# Patient Record
Sex: Female | Born: 1935
Health system: Southern US, Community
[De-identification: ages and names within clinical notes are randomized; demographics above are authoritative.]

## PROBLEM LIST (undated history)

## (undated) DIAGNOSIS — E039 Hypothyroidism, unspecified: Secondary | ICD-10-CM

## (undated) DIAGNOSIS — E559 Vitamin D deficiency, unspecified: Secondary | ICD-10-CM

## (undated) DIAGNOSIS — J449 Chronic obstructive pulmonary disease, unspecified: Secondary | ICD-10-CM

## (undated) DIAGNOSIS — F329 Major depressive disorder, single episode, unspecified: Secondary | ICD-10-CM

## (undated) DIAGNOSIS — E785 Hyperlipidemia, unspecified: Secondary | ICD-10-CM

## (undated) DIAGNOSIS — I10 Essential (primary) hypertension: Secondary | ICD-10-CM

## (undated) DIAGNOSIS — F32A Depression, unspecified: Secondary | ICD-10-CM

## (undated) DIAGNOSIS — R42 Dizziness and giddiness: Secondary | ICD-10-CM

## (undated) DIAGNOSIS — R7303 Prediabetes: Secondary | ICD-10-CM

## (undated) DIAGNOSIS — K219 Gastro-esophageal reflux disease without esophagitis: Secondary | ICD-10-CM

## (undated) HISTORY — DX: Gastro-esophageal reflux disease without esophagitis: K21.9

## (undated) HISTORY — DX: Prediabetes: R73.03

## (undated) HISTORY — DX: Hyperlipidemia, unspecified: E78.5

## (undated) HISTORY — DX: Vitamin D deficiency, unspecified: E55.9

## (undated) HISTORY — DX: Chronic obstructive pulmonary disease, unspecified: J44.9

## (undated) HISTORY — PX: WISDOM TOOTH EXTRACTION: SHX21

## (undated) HISTORY — DX: Essential (primary) hypertension: I10

## (undated) HISTORY — PX: TONSILLECTOMY: SUR1361

## (undated) HISTORY — PX: BREAST LUMPECTOMY: SHX2

## (undated) HISTORY — DX: Dizziness and giddiness: R42

## (undated) HISTORY — DX: Hypothyroidism, unspecified: E03.9

---

## 1968-02-05 HISTORY — PX: TUBAL LIGATION: SHX77

## 1997-04-05 ENCOUNTER — Ambulatory Visit (HOSPITAL_COMMUNITY): Admission: RE | Admit: 1997-04-05 | Discharge: 1997-04-05 | Payer: Self-pay | Admitting: Internal Medicine

## 1999-10-09 ENCOUNTER — Other Ambulatory Visit: Admission: RE | Admit: 1999-10-09 | Discharge: 1999-10-09 | Payer: Self-pay | Admitting: Obstetrics and Gynecology

## 2001-01-26 ENCOUNTER — Other Ambulatory Visit: Admission: RE | Admit: 2001-01-26 | Discharge: 2001-01-26 | Payer: Self-pay | Admitting: Obstetrics and Gynecology

## 2002-02-25 ENCOUNTER — Other Ambulatory Visit: Admission: RE | Admit: 2002-02-25 | Discharge: 2002-02-25 | Payer: Self-pay | Admitting: Obstetrics and Gynecology

## 2003-03-17 ENCOUNTER — Other Ambulatory Visit: Admission: RE | Admit: 2003-03-17 | Discharge: 2003-03-17 | Payer: Self-pay | Admitting: Obstetrics and Gynecology

## 2003-12-27 ENCOUNTER — Ambulatory Visit (HOSPITAL_COMMUNITY): Admission: RE | Admit: 2003-12-27 | Discharge: 2003-12-27 | Payer: Self-pay | Admitting: Internal Medicine

## 2003-12-30 ENCOUNTER — Ambulatory Visit (HOSPITAL_COMMUNITY): Admission: RE | Admit: 2003-12-30 | Discharge: 2003-12-30 | Payer: Self-pay | Admitting: Internal Medicine

## 2004-01-28 ENCOUNTER — Emergency Department (HOSPITAL_COMMUNITY): Admission: EM | Admit: 2004-01-28 | Discharge: 2004-01-29 | Payer: Self-pay | Admitting: Emergency Medicine

## 2004-02-07 ENCOUNTER — Ambulatory Visit: Payer: Self-pay | Admitting: Critical Care Medicine

## 2004-02-09 ENCOUNTER — Ambulatory Visit: Payer: Self-pay | Admitting: Critical Care Medicine

## 2004-02-23 ENCOUNTER — Ambulatory Visit: Payer: Self-pay | Admitting: Cardiology

## 2004-02-27 ENCOUNTER — Ambulatory Visit: Payer: Self-pay | Admitting: Critical Care Medicine

## 2004-03-06 ENCOUNTER — Ambulatory Visit: Payer: Self-pay

## 2004-03-06 ENCOUNTER — Encounter: Payer: Self-pay | Admitting: Cardiology

## 2004-04-02 ENCOUNTER — Ambulatory Visit: Payer: Self-pay | Admitting: Cardiology

## 2004-04-09 ENCOUNTER — Ambulatory Visit: Payer: Self-pay | Admitting: Critical Care Medicine

## 2004-05-28 ENCOUNTER — Other Ambulatory Visit: Admission: RE | Admit: 2004-05-28 | Discharge: 2004-05-28 | Payer: Self-pay | Admitting: Obstetrics and Gynecology

## 2005-06-06 ENCOUNTER — Ambulatory Visit: Payer: Self-pay | Admitting: Critical Care Medicine

## 2005-07-12 ENCOUNTER — Ambulatory Visit: Payer: Self-pay | Admitting: Critical Care Medicine

## 2005-08-15 ENCOUNTER — Ambulatory Visit: Payer: Self-pay | Admitting: Critical Care Medicine

## 2005-09-19 ENCOUNTER — Ambulatory Visit: Payer: Self-pay | Admitting: Internal Medicine

## 2005-10-09 ENCOUNTER — Ambulatory Visit: Payer: Self-pay | Admitting: Internal Medicine

## 2005-10-15 ENCOUNTER — Ambulatory Visit: Payer: Self-pay | Admitting: Critical Care Medicine

## 2005-11-14 ENCOUNTER — Ambulatory Visit: Payer: Self-pay | Admitting: Critical Care Medicine

## 2005-12-19 ENCOUNTER — Ambulatory Visit: Payer: Self-pay | Admitting: Critical Care Medicine

## 2006-01-20 ENCOUNTER — Ambulatory Visit: Payer: Self-pay | Admitting: Critical Care Medicine

## 2006-02-26 ENCOUNTER — Ambulatory Visit: Payer: Self-pay | Admitting: Critical Care Medicine

## 2006-03-28 ENCOUNTER — Ambulatory Visit: Payer: Self-pay | Admitting: Critical Care Medicine

## 2006-04-29 ENCOUNTER — Ambulatory Visit: Payer: Self-pay | Admitting: Critical Care Medicine

## 2006-05-29 ENCOUNTER — Ambulatory Visit: Payer: Self-pay | Admitting: Internal Medicine

## 2006-07-03 ENCOUNTER — Ambulatory Visit: Payer: Self-pay | Admitting: Internal Medicine

## 2006-07-31 ENCOUNTER — Ambulatory Visit: Payer: Self-pay | Admitting: Internal Medicine

## 2006-09-04 ENCOUNTER — Ambulatory Visit: Payer: Self-pay | Admitting: Internal Medicine

## 2006-10-02 ENCOUNTER — Ambulatory Visit: Payer: Self-pay | Admitting: Internal Medicine

## 2006-11-03 ENCOUNTER — Ambulatory Visit: Payer: Self-pay | Admitting: Internal Medicine

## 2006-11-12 DIAGNOSIS — J449 Chronic obstructive pulmonary disease, unspecified: Secondary | ICD-10-CM

## 2006-11-12 DIAGNOSIS — K219 Gastro-esophageal reflux disease without esophagitis: Secondary | ICD-10-CM

## 2006-12-08 ENCOUNTER — Ambulatory Visit: Payer: Self-pay | Admitting: Internal Medicine

## 2007-01-07 ENCOUNTER — Ambulatory Visit: Payer: Self-pay | Admitting: Internal Medicine

## 2007-02-10 ENCOUNTER — Ambulatory Visit: Payer: Self-pay | Admitting: Internal Medicine

## 2007-03-13 ENCOUNTER — Ambulatory Visit: Payer: Self-pay | Admitting: Internal Medicine

## 2007-04-08 ENCOUNTER — Ambulatory Visit: Payer: Self-pay | Admitting: Critical Care Medicine

## 2007-05-07 ENCOUNTER — Ambulatory Visit: Payer: Self-pay | Admitting: Critical Care Medicine

## 2007-05-07 DIAGNOSIS — J45909 Unspecified asthma, uncomplicated: Secondary | ICD-10-CM | POA: Insufficient documentation

## 2007-06-05 ENCOUNTER — Encounter: Payer: Self-pay | Admitting: Critical Care Medicine

## 2007-06-10 ENCOUNTER — Ambulatory Visit: Payer: Self-pay | Admitting: Critical Care Medicine

## 2007-07-10 ENCOUNTER — Ambulatory Visit: Payer: Self-pay | Admitting: Critical Care Medicine

## 2007-08-10 ENCOUNTER — Ambulatory Visit: Payer: Self-pay | Admitting: Critical Care Medicine

## 2007-09-10 ENCOUNTER — Ambulatory Visit: Payer: Self-pay | Admitting: Critical Care Medicine

## 2007-10-13 ENCOUNTER — Ambulatory Visit: Payer: Self-pay | Admitting: Critical Care Medicine

## 2007-11-13 ENCOUNTER — Ambulatory Visit: Payer: Self-pay | Admitting: Critical Care Medicine

## 2007-12-15 ENCOUNTER — Ambulatory Visit: Payer: Self-pay | Admitting: Critical Care Medicine

## 2008-01-12 ENCOUNTER — Ambulatory Visit: Payer: Self-pay | Admitting: Critical Care Medicine

## 2008-02-09 ENCOUNTER — Encounter: Payer: Self-pay | Admitting: Critical Care Medicine

## 2008-02-11 ENCOUNTER — Ambulatory Visit: Payer: Self-pay | Admitting: Pulmonary Disease

## 2008-03-04 ENCOUNTER — Ambulatory Visit: Payer: Self-pay | Admitting: Internal Medicine

## 2008-03-04 LAB — CONVERTED CEMR LAB
Basophils Absolute: 0 10*3/uL (ref 0.0–0.1)
Basophils Relative: 0.6 % (ref 0.0–3.0)
Eosinophils Absolute: 0.2 10*3/uL (ref 0.0–0.7)
Eosinophils Relative: 2.3 % (ref 0.0–5.0)
HCT: 39.8 % (ref 36.0–46.0)
Hemoglobin: 13.4 g/dL (ref 12.0–15.0)
Lymphocytes Relative: 22.1 % (ref 12.0–46.0)
MCHC: 33.6 g/dL (ref 30.0–36.0)
MCV: 91.8 fL (ref 78.0–100.0)
Monocytes Absolute: 0.6 10*3/uL (ref 0.1–1.0)
Monocytes Relative: 8.2 % (ref 3.0–12.0)
Neutro Abs: 5 10*3/uL (ref 1.4–7.7)
Neutrophils Relative %: 66.8 % (ref 43.0–77.0)
Platelets: 219 10*3/uL (ref 150–400)
RBC: 4.34 M/uL (ref 3.87–5.11)
RDW: 13.4 % (ref 11.5–14.6)
WBC: 7.4 10*3/uL (ref 4.5–10.5)

## 2008-03-08 ENCOUNTER — Encounter: Payer: Self-pay | Admitting: Internal Medicine

## 2008-03-08 LAB — CONVERTED CEMR LAB
IgE (Immunoglobulin E), Serum: 151 intl units/mL (ref 0.0–180.0)
IgE (Immunoglobulin E), Serum: 156.7 intl units/mL (ref 0.0–180.0)

## 2008-03-14 ENCOUNTER — Ambulatory Visit: Payer: Self-pay | Admitting: Pulmonary Disease

## 2008-04-05 ENCOUNTER — Ambulatory Visit: Payer: Self-pay | Admitting: Internal Medicine

## 2008-04-14 ENCOUNTER — Ambulatory Visit: Payer: Self-pay | Admitting: Internal Medicine

## 2008-05-16 ENCOUNTER — Ambulatory Visit: Payer: Self-pay | Admitting: Critical Care Medicine

## 2008-05-24 ENCOUNTER — Ambulatory Visit: Payer: Self-pay | Admitting: Critical Care Medicine

## 2008-06-15 ENCOUNTER — Ambulatory Visit: Payer: Self-pay | Admitting: Critical Care Medicine

## 2008-07-05 ENCOUNTER — Ambulatory Visit: Payer: Self-pay | Admitting: Critical Care Medicine

## 2008-07-08 ENCOUNTER — Encounter: Payer: Self-pay | Admitting: Critical Care Medicine

## 2008-07-18 ENCOUNTER — Ambulatory Visit: Payer: Self-pay | Admitting: Internal Medicine

## 2008-07-19 ENCOUNTER — Ambulatory Visit: Payer: Self-pay | Admitting: Internal Medicine

## 2008-07-19 ENCOUNTER — Encounter: Payer: Self-pay | Admitting: Critical Care Medicine

## 2008-08-04 ENCOUNTER — Encounter: Payer: Self-pay | Admitting: Critical Care Medicine

## 2008-08-17 ENCOUNTER — Ambulatory Visit: Payer: Self-pay | Admitting: Critical Care Medicine

## 2008-08-23 ENCOUNTER — Ambulatory Visit: Payer: Self-pay | Admitting: Critical Care Medicine

## 2008-09-16 ENCOUNTER — Ambulatory Visit: Payer: Self-pay | Admitting: Critical Care Medicine

## 2008-10-17 ENCOUNTER — Ambulatory Visit: Payer: Self-pay | Admitting: Critical Care Medicine

## 2008-10-17 ENCOUNTER — Encounter: Payer: Self-pay | Admitting: Internal Medicine

## 2008-10-17 ENCOUNTER — Telehealth (INDEPENDENT_AMBULATORY_CARE_PROVIDER_SITE_OTHER): Payer: Self-pay | Admitting: *Deleted

## 2008-11-16 ENCOUNTER — Ambulatory Visit: Payer: Self-pay | Admitting: Critical Care Medicine

## 2008-12-19 ENCOUNTER — Ambulatory Visit: Payer: Self-pay | Admitting: Internal Medicine

## 2009-01-18 ENCOUNTER — Ambulatory Visit: Payer: Self-pay | Admitting: Critical Care Medicine

## 2009-02-07 ENCOUNTER — Encounter: Payer: Self-pay | Admitting: Critical Care Medicine

## 2009-02-17 ENCOUNTER — Ambulatory Visit: Payer: Self-pay | Admitting: Critical Care Medicine

## 2009-03-20 ENCOUNTER — Ambulatory Visit: Payer: Self-pay | Admitting: Internal Medicine

## 2009-04-18 ENCOUNTER — Ambulatory Visit: Payer: Self-pay | Admitting: Critical Care Medicine

## 2009-05-08 ENCOUNTER — Ambulatory Visit: Payer: Self-pay | Admitting: Critical Care Medicine

## 2009-05-22 ENCOUNTER — Ambulatory Visit: Payer: Self-pay | Admitting: Critical Care Medicine

## 2009-06-14 ENCOUNTER — Encounter: Payer: Self-pay | Admitting: Critical Care Medicine

## 2009-06-21 ENCOUNTER — Ambulatory Visit: Payer: Self-pay | Admitting: Critical Care Medicine

## 2009-07-20 ENCOUNTER — Ambulatory Visit: Payer: Self-pay | Admitting: Critical Care Medicine

## 2009-08-17 ENCOUNTER — Ambulatory Visit: Payer: Self-pay | Admitting: Critical Care Medicine

## 2009-09-15 ENCOUNTER — Ambulatory Visit: Payer: Self-pay | Admitting: Critical Care Medicine

## 2009-10-16 ENCOUNTER — Ambulatory Visit: Payer: Self-pay | Admitting: Critical Care Medicine

## 2009-11-14 ENCOUNTER — Ambulatory Visit: Payer: Self-pay | Admitting: Critical Care Medicine

## 2009-11-22 ENCOUNTER — Ambulatory Visit (HOSPITAL_COMMUNITY): Admission: RE | Admit: 2009-11-22 | Discharge: 2009-11-22 | Payer: Self-pay | Admitting: Internal Medicine

## 2009-11-22 ENCOUNTER — Encounter: Admission: RE | Admit: 2009-11-22 | Discharge: 2009-11-22 | Payer: Self-pay | Admitting: Obstetrics and Gynecology

## 2009-12-14 ENCOUNTER — Ambulatory Visit: Payer: Self-pay | Admitting: Critical Care Medicine

## 2010-01-12 ENCOUNTER — Ambulatory Visit: Payer: Self-pay | Admitting: Critical Care Medicine

## 2010-02-13 ENCOUNTER — Ambulatory Visit
Admission: RE | Admit: 2010-02-13 | Discharge: 2010-02-13 | Payer: Self-pay | Source: Home / Self Care | Attending: Critical Care Medicine | Admitting: Critical Care Medicine

## 2010-02-13 ENCOUNTER — Telehealth: Payer: Self-pay | Admitting: Critical Care Medicine

## 2010-02-14 ENCOUNTER — Encounter: Payer: Self-pay | Admitting: Critical Care Medicine

## 2010-02-15 ENCOUNTER — Encounter: Payer: Self-pay | Admitting: Critical Care Medicine

## 2010-02-16 ENCOUNTER — Ambulatory Visit
Admission: RE | Admit: 2010-02-16 | Discharge: 2010-02-16 | Payer: Self-pay | Source: Home / Self Care | Attending: Critical Care Medicine | Admitting: Critical Care Medicine

## 2010-02-19 ENCOUNTER — Encounter: Payer: Self-pay | Admitting: Critical Care Medicine

## 2010-03-08 NOTE — Assessment & Plan Note (Signed)
Summary: xolair/apc   Nurse Visit   Allergies: 1)  ! Codeine 2)  ! Darvocet  Medication Administration  Injection # 1:    Medication: Xolair (omalizumab) 150mg     Diagnosis: EXTRINSIC ASTHMA, UNSPECIFIED (ICD-493.00)    Route: IM    Site: L deltoid    Exp Date: 11/04/2012    Lot #: [id number]    Mfr: Genetech    Comments: xolair  150, units 30 1.2 ml x 1 in l eft deltoid. Pt waited 20 mins.    Given by: Drucie Opitz, CMA  Orders Added: 1)  Xolair (omalizumab) 150mg  [J2357] 2)  Administration xolair injection [phone]

## 2010-03-08 NOTE — Assessment & Plan Note (Signed)
Summary: 1 month xolair///kp   Nurse Visit   Allergies: 1)  ! Codeine 2)  ! Darvocet  Medication Administration  Injection # 1:    Medication: Xolair (omalizumab) 150mg     Diagnosis: EXTRINSIC ASTHMA, UNSPECIFIED (ICD-493.00)    Route: SQ    Site: L deltoid    Exp Date: 06/04/2012    Lot #: [id number]    Mfr: GENENTECH    Comments: 1.2 ML IN LEFT ARM PT WAITED 30 MINS CHARGES 96401 AND  J2357    Patient tolerated injection without complications    Given by: TAMMY SCOTT IN ALLERGY LAB   Medication Administration  Injection # 1:    Medication: Xolair (omalizumab) 150mg     Diagnosis: EXTRINSIC ASTHMA, UNSPECIFIED (ICD-493.00)    Route: SQ    Site: L deltoid    Exp Date: 06/04/2012    Lot #: [id number]    Mfr: GENENTECH    Comments: 1.2 ML IN LEFT ARM PT WAITED 30 MINS CHARGES 96401 AND  [id number]    Patient tolerated injection without complications    Given by: TAMMY SCOTT IN ALLERGY LAB

## 2010-03-08 NOTE — Assessment & Plan Note (Signed)
Summary: XOLAIR///KP   Nurse Visit   Allergies: 1)  ! Codeine 2)  ! Darvocet  Medication Administration  Injection # 1:    Medication: Xolair (omalizumab) 150mg     Diagnosis: EXTRINSIC ASTHMA, UNSPECIFIED (ICD-493.00)    Route: SQ    Site: L deltoid    Exp Date: 06/2012    Lot #: [id number]    Mfr: Mendel Ryder    Comments: Injection given by Dimas Millin in allergy lab. Xolair 150mg . 1.32ml x 1 in Left Deltoid. Pt waited 30 minutes.     Patient tolerated injection without complications  Orders Added: 1)  Xolair (omalizumab) 150mg  [J2357] 2)  Administration xolair injection [phone]

## 2010-03-08 NOTE — Assessment & Plan Note (Signed)
Summary: Geoffry Paradise ///kp   Nurse Visit   Allergies: 1)  ! Codeine 2)  ! Darvocet  Medication Administration  Injection # 1:    Medication: Xolair (omalizumab) 150mg     Diagnosis: EXTRINSIC ASTHMA, UNSPECIFIED (ICD-493.00)    Route: SQ    Site: R deltoid    Exp Date: 11/2012    Lot #: [id number]    Mfr: Genetech    Comments: 1.2 ML IN RIGHT ARM 150MG  PT WAITED 15 MINS CHARGED J2357 AND 04540    Patient tolerated injection without complications    Given by: SUSANNE FORD IN ALLERGY LAB  Orders Added: 1)  Xolair (omalizumab) 150mg  [J2357] 2)  Administration xolair injection [[id number]]   Medication Administration  Injection # 1:    Medication: Xolair (omalizumab) 150mg     Diagnosis: EXTRINSIC ASTHMA, UNSPECIFIED (ICD-493.00)    Route: SQ    Site: R deltoid    Exp Date: 11/2012    Lot #: [id number]    Mfr: Genetech    Comments: 1.2 ML IN RIGHT ARM 150MG  PT WAITED 15 MINS CHARGED J2357 AND 56213    Patient tolerated injection without complications    Given by: SUSANNE FORD IN ALLERGY LAB  Orders Added: 1)  Xolair (omalizumab) 150mg  [J2357] 2)  Administration xolair injection [[id number]]

## 2010-03-08 NOTE — Medication Information (Signed)
Summary: [facility]  CMN/[facility]   Imported By: Lester  02/20/2010 07:08:48  _____________________________________________________________________  External Attachment:    Type:   Image     Comment:   External Document

## 2010-03-08 NOTE — Assessment & Plan Note (Signed)
Summary: xolair/apc   Nurse Visit   Allergies: 1)  ! Codeine 2)  ! Darvocet  Medication Administration  Injection # 1:    Medication: Xolair (omalizumab) 150mg     Diagnosis: EXTRINSIC ASTHMA, UNSPECIFIED (ICD-493.00)    Route: IM    Site: R deltoid    Exp Date: 09/04/2012    Lot #: [id number]    Mfr: Salome Spotted    Comments: xolair 150 mg, 30 units    Given by: Clarise Cruz (AAMA)  Orders Added: 1)  Xolair (omalizumab) 150mg  [J2357] 2)  Administration xolair injection [[id number]]   Medication Administration  Injection # 1:    Medication: Xolair (omalizumab) 150mg     Diagnosis: EXTRINSIC ASTHMA, UNSPECIFIED (ICD-493.00)    Route: IM    Site: R deltoid    Exp Date: 09/04/2012    Lot #: [id number]    Mfr: Salome Spotted    Comments: xolair 150 mg, 30 units    Given by: Clarise Cruz (AAMA)  Orders Added: 1)  Xolair (omalizumab) 150mg  [J2357] 2)  Administration xolair injection [[id number]]

## 2010-03-08 NOTE — Assessment & Plan Note (Signed)
Summary: Kathy Whitaker ///kp   Nurse Visit   Allergies: 1)  ! Codeine 2)  ! Darvocet  Medication Administration  Injection # 1:    Medication: Xolair (omalizumab) 150mg     Diagnosis: EXTRINSIC ASTHMA, UNSPECIFIED (ICD-493.00)    Route: SQ    Site: L deltoid    Exp Date: 02/2013    Lot #: [id number]    Mfr: Genetech    Comments: 1.2 ML IN LEFT ARM 150MG  CHARGED [id number] AND  04540    Given by: Dimas Millin IN ALLERGY LAB  Orders Added: 1)  Xolair (omalizumab) 150mg  [J2357] 2)  Administration xolair injection [[id number]]   Medication Administration  Injection # 1:    Medication: Xolair (omalizumab) 150mg     Diagnosis: EXTRINSIC ASTHMA, UNSPECIFIED (ICD-493.00)    Route: SQ    Site: L deltoid    Exp Date: 02/2013    Lot #: [id number]    Mfr: Genetech    Comments: 1.2 ML IN LEFT ARM 150MG  CHARGED [id number] AND  56213    Given by: Dimas Millin IN ALLERGY LAB  Orders Added: 1)  Xolair (omalizumab) 150mg  [J2357] 2)  Administration xolair injection [08657]

## 2010-03-08 NOTE — Assessment & Plan Note (Signed)
Summary: Pulmonary OV   Primary Provider/Referring Provider:  Oneta Rack  CC:  6 month asthma follow up.  Pt states breathing is doing "good" as long as she is using nebs.  States she has a dry cough x 2-3 days.  Denies wheezing and chest tightness.  History of Present Illness:  77 yo WF with severe atopic asthma  May 08, 2009  Pt doing well and no new issues. Pt denies any significant sore throat, nasal congestion or excess secretions, fever, chills, sweats, unintended weight loss, pleurtic or exertional chest pain, orthopnea PND, or leg swelling Pt denies any increase in rescue therapy over baseline, denies waking up needing it or having any early am or nocturnal exacerbations of coughing/wheezing/or dyspnea.    Preventive Screening-Counseling & Management  Alcohol-Tobacco     Smoking Status: never  Current Medications (verified): 1)  Levothroid 75 Mcg Tabs (Levothyroxine Sodium) .[name].. 1 By Mouth Daily 2)  Zocor 40 Mg  Tabs (Simvastatin) .... One By Mouth Once Daily 3)  Boniva 150 Mg Tabs (Ibandronate Sodium) .... Take 1 By Mouth Once A Month 4)  Calcium 500/d 500-200 Mg-Unit  Tabs (Calcium Carbonate-Vitamin D) .... One By Mouth Once Daily 5)  Zoloft 50 Mg  Tabs (Sertraline Hcl) .... One By Mouth Once Daily 6)  Xolair 150 Mg  Solr (Omalizumab) .... 150mg  Subcutaneously Monthly 7)  Combivent 103-18 Mcg/act  Aero (Albuterol-Ipratropium) .... Take As Directed 8)  Hhn .... As Needed 9)  Epipen 0.3 Mg/0.55ml (1:1000) Devi (Epinephrine Hcl (Anaphylaxis)) .... For Severe Allergic Reaction 10)  Duoneb 0.5-2.5 (3) Mg/64ml Soln (Ipratropium-Albuterol) .... Use 1 Vial in Nebulizer  4 Times Daily As Needed 11)  Sm Loratadine 10 Mg Tabs (Loratadine) .[name].. 1 By Mouth Daily 12)  Pulmicort 0.5 Mg/66ml  Susp (Budesonide (Inhalation)) .... One Respule Into Nebulizer Twice Daily. 13)  Brovana 15 Mcg/51ml  Nebu (Arformoterol Tartrate) .... One in Nebulizer Twice Daily 14)  Vitamin D 1000 Unit Caps  (Cholecalciferol) .... Two By Mouth Daily  Allergies (verified): 1)  ! Codeine 2)  ! Darvocet  Past History:  Past medical, surgical, family and social histories (including risk factors) reviewed, and no changes noted (except as noted below).  Past Medical History: Reviewed history from 04/05/2008 and no changes required. Asthma  Skin test pos 10/09/05, Immunocap/ RAST 03/04/08 GERD Hyperlipidemia Hypothyroidism  Past Pulmonary History:  Pulmonary History: Asthma  Skin test pos 10/09/05, Immunocap/ RAST 03/04/08    -FeV1 90% FeV1/FVC 72%  TLC 118% DLCO 89%  Family History: Reviewed history and no changes required.  Social History: Reviewed history and no changes required.  Review of Systems       The patient complains of shortness of breath with activity.  The patient denies shortness of breath at rest, productive cough, non-productive cough, coughing up blood, chest pain, irregular heartbeats, acid heartburn, indigestion, loss of appetite, weight change, abdominal pain, difficulty swallowing, sore throat, tooth/dental problems, headaches, nasal congestion/difficulty breathing through nose, sneezing, itching, ear ache, anxiety, depression, hand/feet swelling, joint stiffness or pain, rash, change in color of mucus, and fever.    Vital Signs:  Patient profile:   77 year old female Height:      60 inches Weight:      151.25 pounds BMI:     29.65 O2 Sat:      96 % on Room air Temp:     97.6 degrees F oral Pulse rate:   71 / minute BP sitting:   114 / 62  (  left arm) Cuff size:   regular  Vitals Entered By: Gweneth Dimitri RN (May 08, 2009 4:13 PM)  O2 Flow:  Room air CC: 6 month asthma follow up.  Pt states breathing is doing "good" as long as she is using nebs.  States she has a dry cough x 2-3 days.  Denies wheezing and chest tightness Comments Medications reviewed with patient Daytime contact number verified with patient. Gweneth Dimitri RN  May 08, 2009 4:15  PM    Physical Exam  Additional Exam:  Gen: Pleasant, well-nourished, in no distress , normal affect ENT: no lesions, no post nasal drip Neck: No JVD, no TMG, no carotid bruits Lungs: No use of accessory muscles, no dullness to percussion, clear Cardiovascular: RRR, heart sounds normal, no murmurs or gallops, no peripheral edema Abdomen: soft and non-tender, no HSM, BS normal Musculoskeletal: No deformities, no cyanosis or clubbing Neuro: alert, non-focal Skin: no areas of urticaria seen at L arm at [facility] site of injections     Impression & Recommendations:  Problem # 1:  EXTRINSIC ASTHMA, UNSPECIFIED (ICD-493.00) Assessment Unchanged  Moderate persistent asthma stable at this time plan No change in inhaled medications.   Maintain treatment program as currently prescribed. cont xolair as is  Complete Medication List: 1)  Levothroid 75 Mcg Tabs (Levothyroxine sodium) .[name].. 1 by mouth daily 2)  Zocor 40 Mg Tabs (Simvastatin) .... One by mouth once daily 3)  Boniva 150 Mg Tabs (Ibandronate sodium) .... Take 1 by mouth once a month 4)  Calcium 500/d 500-200 Mg-unit Tabs (Calcium carbonate-vitamin d) .... One by mouth once daily 5)  Zoloft 50 Mg Tabs (Sertraline hcl) .... One by mouth once daily 6)  Xolair 150 Mg Solr (Omalizumab) .... 150mg  subcutaneously monthly 7)  Combivent 103-18 Mcg/act Aero (Albuterol-ipratropium) .... Take as directed 8)  Hhn  .... As needed 9)  Epipen 0.3 Mg/0.57ml (1:1000) Devi (Epinephrine hcl (anaphylaxis)) .... For severe allergic reaction 10)  Duoneb 0.5-2.5 (3) Mg/67ml Soln (Ipratropium-albuterol) .... Use 1 vial in nebulizer  4 times daily as needed 11)  Sm Loratadine 10 Mg Tabs (Loratadine) .[name].. 1 by mouth daily 12)  Pulmicort 0.5 Mg/22ml Susp (Budesonide (inhalation)) .... One respule into nebulizer twice daily. 13)  Brovana 15 Mcg/59ml Nebu (Arformoterol tartrate) .... One in nebulizer twice daily 14)  Vitamin D 1000 Unit Caps (Cholecalciferol)  .... Two by mouth daily  Other Orders: Est. Patient Level III (16109)  Patient Instructions: 1)  No change in medications  2)  Return 5 months    Immunization History:  Influenza Immunization History:    Influenza:  historical (12/05/2008)

## 2010-03-08 NOTE — Letter (Signed)
Summary: Statement of Medical Necessity / [facility]  Statement of Medical Necessity / [facility]   Imported By: Lennie Odor 02/26/2010 12:18:02  _____________________________________________________________________  External Attachment:    Type:   Image     Comment:   External Document

## 2010-03-08 NOTE — Progress Notes (Signed)
Summary: neb refills at [facility]  Phone Note Call from Patient   Caller: Patient Call For: wright Summary of Call: patient is here she states that [facility] told her they have sent several messages over regarding her medication for her nebulizer. Patient has enough left to last for about 1 week. She can be reached [phone] Initial call taken by: Vedia Coffer,  February 13, 2010 2:09 PM  Follow-up for Phone Call        Pt needs appt, is overdue- [facility] x 1 Vernie Murders  February 13, 2010 3:15 PM  Returning call.Darletta Moll  February 14, 2010 2:29 PM  called spoke with patient.  appt scheduled w/ [facility] for 1.13.12 @ 1020.  pt requests this refill be taken care of today as her brovana has to be refrigerated.  **NOTE: no neb meds are on pt's med list.  crystal, have you seen a refill request from [facility] for this pt's brovana and budesonide?  no refills for her in triage. Boone Master CNA/MA  February 14, 2010 2:41 PM   called spoke with crystal.  no sign of this fax in her or [facility]'s paperwork.   called spoke with Victorino Dike at [facility].  she states that this refill was sent via [facility] on 12.29 and 1.9.  asked Victorino Dike to go ahead and fax this to the triage fax number.  will forward to Crystal. Boone Master CNA/MA  February 14, 2010 2:52 PM     Additional Follow-up for Phone Call Additional follow up Details #1::        Dr. Delford Field, pt requesting refill rx be signed today.  Rxs placed in your to do folder. Gweneth Dimitri RN  February 14, 2010 3:49 PM     Additional Follow-up for Phone Call Additional follow up Details #2::    noted and will sign Follow-up by: Storm Frisk MD,  February 14, 2010 5:05 PM  New/Updated Medications: BROVANA 15 MCG/2ML NEBU (ARFORMOTEROL TARTRATE) 1 vial in HHN two times a day BUDESONIDE 0.25 MG/2ML SUSP (BUDESONIDE) 1 vial in HHN two times a day

## 2010-03-08 NOTE — Assessment & Plan Note (Signed)
Summary: xolair/ mbw   Nurse Visit   Allergies: 1)  ! Codeine 2)  ! Darvocet  Medication Administration  Injection # 1:    Medication: Xolair (omalizumab) 150mg     Diagnosis: EXTRINSIC ASTHMA, UNSPECIFIED (ICD-493.00)    Route: SQ    Site: R deltoid    Exp Date: 05/05/2012    Lot #: [id number]    Mfr: genentech    Comments: 1.39ml in right arm pt waited 30 min    Patient tolerated injection without complications    Given by: tammy scott in allergy  Orders Added: 1)  Xolair (omalizumab) 150mg  [J2357] 2)  Admin of patients own med IM/SQ [04540J]

## 2010-03-08 NOTE — Letter (Signed)
Summary: SMN for Nebulizer & meds/[facility]  [facility] for Nebulizer & meds/[facility]   Imported By: Sherian Rein 02/10/2009 10:11:04  _____________________________________________________________________  External Attachment:    Type:   Image     Comment:   External Document

## 2010-03-08 NOTE — Assessment & Plan Note (Signed)
Summary: xolair/apc   Nurse Visit   Allergies: 1)  ! Codeine 2)  ! Darvocet  Medication Administration  Injection # 1:    Medication: Xolair (omalizumab) 150mg     Diagnosis: 493.00    Route: SQ    Site: R deltoid    Exp Date: 06/2012    Lot #: [id number]    Mfr: Salome Spotted    Comments: 1.2 ML IN RIGHT ARM CHARGED [phone] AND [phone]    Patient tolerated injection without complications    Given by: TAMMY SCOTT IN ALLERGY LAB   Medication Administration  Injection # 1:    Medication: Xolair (omalizumab) 150mg     Diagnosis: 493.00    Route: SQ    Site: R deltoid    Exp Date: 06/2012    Lot #: [id number]    Mfr: Salome Spotted    Comments: 1.2 ML IN RIGHT ARM CHARGED [id number] AND [phone]    Patient tolerated injection without complications    Given by: TAMMY SCOTT IN ALLERGY LAB

## 2010-03-08 NOTE — Assessment & Plan Note (Signed)
Summary: xolair/apc   Nurse Visit   Allergies: 1)  ! Codeine 2)  ! Darvocet  Medication Administration  Injection # 1:    Medication: Xolair (omalizumab) 150mg     Diagnosis: EXTRINSIC ASTHMA, UNSPECIFIED (ICD-493.00)    Route: SQ    Site: R deltoid    Exp Date: 02/2012    Lot #: [id number]    Mfr: Mendel Ryder    Comments: Injection given by Dimas Millin in allergy lab. Xolair 150mg . 1.57ml x 1 in Right Deltoid. Pt waited 15 minutes.    Patient tolerated injection without complications  Orders Added: 1)  Admin of Therapeutic Inj  intramuscular or subcutaneous [96372] 2)  Xolair (omalizumab) 150mg  [J2357]

## 2010-03-08 NOTE — Letter (Signed)
Summary: Statement of Medical Necessity / [facility]  Statement of Medical Necessity / [facility]   Imported By: Lennie Odor 02/26/2010 11:09:24  _____________________________________________________________________  External Attachment:    Type:   Image     Comment:   External Document

## 2010-03-08 NOTE — Assessment & Plan Note (Signed)
Summary: xolair 1 month///kp   Nurse Visit   Allergies: 1)  ! Codeine 2)  ! Darvocet  Medication Administration  Injection # 1:    Medication: Xolair (omalizumab) 150mg     Diagnosis: EXTRINSIC ASTHMA, UNSPECIFIED (ICD-493.00)    Route: SQ    Site: L deltoid    Exp Date: 04/2012    Lot #: [id number]    Mfr: Mendel Ryder    Comments: Injection given by Dimas Millin in allergy lab. Xolair 150mg . 1.25ml x 1in Left Deltoid. Pt waited 15 minutes.     Patient tolerated injection without complications  Orders Added: 1)  Admin of Therapeutic Inj  intramuscular or subcutaneous [96372] 2)  Xolair (omalizumab) 150mg  [J2357]

## 2010-03-08 NOTE — Assessment & Plan Note (Signed)
Summary: 1 MONTH Geoffry Paradise ///KP   Nurse Visit   Allergies: 1)  ! Codeine 2)  ! Darvocet  Medication Administration  Injection # 1:    Medication: Xolair (omalizumab) 150mg     Diagnosis: EXTRINSIC ASTHMA, UNSPECIFIED (ICD-493.00)    Route: SQ    Site: R deltoid    Exp Date: 04/06/2012    Lot #: [id number]    Mfr: genetech    Comments: 1.2 ML IN RIGHT PT WAITED 20 MIN    Patient tolerated injection without complications    Given by: SUSANNE FORD IN ALLERGY LAB  Orders Added: 1)  Xolair (omalizumab) 150mg  [J2357] 2)  Administration xolair injection [04540]   Medication Administration  Injection # 1:    Medication: Xolair (omalizumab) 150mg     Diagnosis: EXTRINSIC ASTHMA, UNSPECIFIED (ICD-493.00)    Route: SQ    Site: R deltoid    Exp Date: 04/06/2012    Lot #: [id number]    Mfr: genetech    Comments: 1.2 ML IN RIGHT PT WAITED 20 MIN    Patient tolerated injection without complications    Given by: SUSANNE FORD IN ALLERGY LAB  Orders Added: 1)  Xolair (omalizumab) 150mg  [J2357] 2)  Administration xolair injection [47829]

## 2010-03-08 NOTE — Assessment & Plan Note (Signed)
Summary: Pulmonary OV   Primary Provider/Referring Provider:  Oneta Rack  CC:  Follow up.  Pt states she does have SOB when going up stairs or out in the cold but overall breathing is doing good.  Very little wheezing.  Occas chest tightness and occas nonprod cough..  History of Present Illness:  77 yo WF with severe atopic asthma  May 08, 2009  Pt doing well and no new issues. Pt denies any significant sore throat, nasal congestion or excess secretions, fever, chills, sweats, unintended weight loss, pleurtic or exertional chest pain, orthopnea PND, or leg swelling Pt denies any increase in rescue therapy over baseline, denies waking up needing it or having any early am or nocturnal exacerbations of coughing/wheezing/or dyspnea.  February 16, 2010 11:01 AM No new issues.  Pt denies any significant sore throat, nasal congestion or excess secretions, fever, chills, sweats, unintended weight loss, pleurtic or exertional chest pain, orthopnea PND, or leg swelling Pt denies any increase in rescue therapy over baseline, denies waking up needing it or having any early am or nocturnal exacerbations of coughing/wheezing/or dyspnea.   Asthma History    Asthma Control Assessment:    Age range: 12+ years    Symptoms: 0-2 days/week    Nighttime Awakenings: 0-2/month    Interferes w/ normal activity: no limitations    SABA use (not for EIB): 0-2 days/week    ATAQ questionnaire: 0    FEV1: 1.40 liters (today)    FEV1 Pred: 1.61 liters (today)    Exacerbations requiring oral systemic steroids: 0-1/year    Asthma Control Assessment: Well Controlled   Current Medications (verified): 1)  Levothroid 75 Mcg Tabs (Levothyroxine Sodium) .[name].. 1 By Mouth Daily 2)  Zocor 40 Mg  Tabs (Simvastatin) .... One By Mouth Once Daily 3)  Calcium 500/d 500-200 Mg-Unit  Tabs (Calcium Carbonate-Vitamin D) .... One By Mouth Once Daily 4)  Zoloft 50 Mg  Tabs (Sertraline Hcl) .... One By Mouth Once Daily 5)  Xolair 150  Mg  Solr (Omalizumab) .... 150mg  Subcutaneously Monthly 6)  Combivent 103-18 Mcg/act  Aero (Albuterol-Ipratropium) .... Take As Directed 7)  Hhn .... As Needed 8)  Epipen 0.3 Mg/0.9ml (1:1000) Devi (Epinephrine Hcl (Anaphylaxis)) .... For Severe Allergic Reaction 9)  Sm Loratadine 10 Mg Tabs (Loratadine) .[name].. 1 By Mouth Daily 10)  Vitamin D 1000 Unit Caps (Cholecalciferol) .... Two By Mouth Daily 11)  Brovana 15 Mcg/50ml Nebu (Arformoterol Tartrate) .[name].. 1 Vial in Hhn Two Times A Day 12)  Budesonide 0.25 Mg/44ml Susp (Budesonide) .[name].. 1 Vial in Hhn Two Times A Day  Allergies (verified): 1)  ! Codeine 2)  ! Darvocet  Past History:  Past medical, surgical, family and social histories (including risk factors) reviewed, and no changes noted (except as noted below).  Past Medical History: Reviewed history from 04/05/2008 and no changes required. Asthma  Skin test pos 10/09/05, Immunocap/ RAST 03/04/08 GERD Hyperlipidemia Hypothyroidism  Past Pulmonary History:  Pulmonary History: Asthma  Skin test pos 10/09/05, Immunocap/ RAST 03/04/08    -FeV1 90% FeV1/FVC 72%  TLC 118% DLCO 89%  Family History: Reviewed history and no changes required.  Social History: Reviewed history and no changes required. Never smoked wine daily 2 children married Retired   Review of Systems       The patient complains of shortness of breath with activity.  The patient denies shortness of breath at rest, productive cough, non-productive cough, coughing up blood, chest pain, irregular heartbeats, acid heartburn, indigestion, loss  of appetite, weight change, abdominal pain, difficulty swallowing, sore throat, tooth/dental problems, headaches, nasal congestion/difficulty breathing through nose, sneezing, itching, ear ache, anxiety, depression, hand/feet swelling, joint stiffness or pain, rash, change in color of mucus, and fever.    Vital Signs:  Patient profile:   77 year old female Height:      60  inches Weight:      143.13 pounds BMI:     28.05 O2 Sat:      94 % on Room air Temp:     97.9 degrees F oral Pulse rate:   60 / minute BP sitting:   106 / 68  (right arm) Cuff size:   regular  Vitals Entered By: Gweneth Dimitri RN (February 16, 2010 10:34 AM)  O2 Flow:  Room air CC: Follow up.  Pt states she does have SOB when going up stairs or out in the cold but overall breathing is doing good.  Very little wheezing.  Occas chest tightness and occas nonprod cough. Comments Medications reviewed with patient Daytime contact number verified with patient. Gweneth Dimitri RN  February 16, 2010 10:34 AM    Physical Exam  Additional Exam:  Gen: Pleasant, well-nourished, in no distress,  normal affect ENT: No lesions,  mouth clear,  oropharynx clear, no postnasal drip Neck: No JVD, no TMG, no carotid bruits Lungs: No use of accessory muscles, no dullness to percussion, distant BS Cardiovascular: RRR, heart sounds normal, no murmur or gallops, no peripheral edema Abdomen: soft and NT, no HSM,  BS normal Musculoskeletal: No deformities, no cyanosis or clubbing Neuro: alert, non focal Skin: Warm, no lesions or rashes    Pre-Spirometry FEV1    Value: 1.40 L     Pred: 1.61 L     Impression & Recommendations:  Problem # 1:  OBSTRUCTIVE CHRONIC BRONCHITIS (ICD-491.20) Assessment Unchanged chronic persistent astham improved plan No change in inhaled medications.   Maintain treatment program as currently prescribed.  Complete Medication List: 1)  Levothroid 75 Mcg Tabs (Levothyroxine sodium) .[name].. 1 by mouth daily 2)  Zocor 40 Mg Tabs (Simvastatin) .... One by mouth once daily 3)  Calcium 500/d 500-200 Mg-unit Tabs (Calcium carbonate-vitamin d) .... One by mouth once daily 4)  Zoloft 50 Mg Tabs (Sertraline hcl) .... One by mouth once daily 5)  Xolair 150 Mg Solr (Omalizumab) .... 150mg  subcutaneously monthly 6)  Combivent 103-18 Mcg/act Aero (Albuterol-ipratropium) .... Take as  directed 7)  Hhn  .... As needed 8)  Epipen 0.3 Mg/0.17ml (1:1000) Devi (Epinephrine hcl (anaphylaxis)) .... For severe allergic reaction 9)  Sm Loratadine 10 Mg Tabs (Loratadine) .[name].. 1 by mouth daily 10)  Vitamin D 1000 Unit Caps (Cholecalciferol) .... Two by mouth daily 11)  Brovana 15 Mcg/66ml Nebu (Arformoterol tartrate) .[name].. 1 vial in hhn two times a day 12)  Budesonide 0.25 Mg/12ml Susp (Budesonide) .[name].. 1 vial in hhn two times a day  Other Orders: Est. Patient Level III (29562)  Patient Instructions: 1)  No change in medications 2)  Return in    9-10      months   Orders Added: 1)  Est. Patient Level III [13086]    Immunization History:  Influenza Immunization History:    Influenza:  historical (10/05/2009)  Pneumovax Immunization History:    Pneumovax:  pneumovax (09/14/2008)   Prevention & Chronic Care Immunizations   Influenza vaccine: Historical  (10/05/2009)    Tetanus booster: Not documented    Pneumococcal vaccine: Pneumovax  (09/14/2008)  H. zoster vaccine: Not documented  Colorectal Screening   Hemoccult: Not documented    Colonoscopy: Not documented  Other Screening   Pap smear: Not documented    Mammogram: Not documented    DXA bone density scan: Not documented   Smoking status: never  (05/08/2009)  Lipids   Total Cholesterol: Not documented   LDL: Not documented   LDL Direct: Not documented   HDL: Not documented   Triglycerides: Not documented    SGOT (AST): Not documented   SGPT (ALT): Not documented   Alkaline phosphatase: Not documented   Total bilirubin: Not documented  Self-Management Support :    Lipid self-management support: Not documented

## 2010-03-08 NOTE — Assessment & Plan Note (Signed)
Summary: xolair/ mbw   Nurse Visit   Allergies: 1)  ! Codeine 2)  ! Darvocet  Medication Administration  Injection # 1:    Medication: Xolair (omalizumab) 150mg     Diagnosis: EXTRINSIC ASTHMA, UNSPECIFIED (ICD-493.00)    Route: SQ    Site: R deltoid    Exp Date: 11/2012    Lot #: [id number]    Mfr: Genetech    Comments: 1.2 ML IN RIGHT ARM CHARGED [phone] AND [phone] PT WAITED 10 MINS    Given by: TAMMY SCOTT IN ALLERGY LAB   Orders Added: 1)  Xolair (omalizumab) 150mg  [J2357] 2)  Administration xolair injection [id number]   Medication Administration  Injection # 1:    Medication: Xolair (omalizumab) 150mg     Diagnosis: EXTRINSIC ASTHMA, UNSPECIFIED (ICD-493.00)    Route: SQ    Site: R deltoid    Exp Date: 11/2012    Lot #: [id number]    Mfr: Genetech    Comments: 1.2 ML IN RIGHT ARM CHARGED [phone] AND J2357 PT WAITED 10 MINS    Given by: TAMMY SCOTT IN ALLERGY LAB   Orders Added: 1)  Xolair (omalizumab) 150mg  [J2357] 2)  Administration xolair injection [[id number]]

## 2010-03-08 NOTE — Miscellaneous (Signed)
Summary: Injection Record / [facility]    Injection Record / [facility]    Imported By: Lennie Odor 06/26/2009 14:43:46  _____________________________________________________________________  External Attachment:    Type:   Image     Comment:   External Document

## 2010-03-08 NOTE — Assessment & Plan Note (Signed)
Summary: Kathy Whitaker ///KP   Nurse Visit   Allergies: 1)  ! Codeine 2)  ! Darvocet  Medication Administration  Injection # 1:    Medication: Xolair (omalizumab) 150mg     Diagnosis: EXTRINSIC ASTHMA, UNSPECIFIED (ICD-493.00)    Route: IM    Site: R deltoid    Exp Date: 11/04/2012    Lot #: [id number]    Mfr: Genetech    Comments: xolair 150 mg 30 units, 1.2 ml in right deltoid pt waited 15 mins.    Given by: Dimas Millin, Allergy tech  Orders Added: 1)  Xolair (omalizumab) 150mg  [J2357] 2)  Administration xolair injection [phone]

## 2010-03-15 ENCOUNTER — Encounter: Payer: Self-pay | Admitting: Critical Care Medicine

## 2010-03-15 ENCOUNTER — Ambulatory Visit (INDEPENDENT_AMBULATORY_CARE_PROVIDER_SITE_OTHER): Payer: Medicare Other

## 2010-03-15 ENCOUNTER — Ambulatory Visit: Payer: Self-pay

## 2010-03-15 DIAGNOSIS — J45909 Unspecified asthma, uncomplicated: Secondary | ICD-10-CM

## 2010-03-22 NOTE — Assessment & Plan Note (Signed)
Summary: xolair//sh   Nurse Visit   Allergies: 1)  ! Codeine 2)  ! Darvocet  Medication Administration  Injection # 1:    Medication: Xolair (omalizumab) 150mg     Diagnosis: EXTRINSIC ASTHMA, UNSPECIFIED (ICD-493.00)    Route: SQ    Site: R deltoid    Exp Date: 02/2013    Lot #: [id number]    Mfr: Genetech    Comments: 1.2 ML IN RIGHT ARM 150MG  CHARGED [id number] AND  [id number]    Given by: Dimas Millin IN ALLERGY LAB   Orders Added: 1)  Xolair (omalizumab) 150mg  [J2357] 2)  Administration xolair injection [[id number]]   Medication Administration  Injection # 1:    Medication: Xolair (omalizumab) 150mg     Diagnosis: EXTRINSIC ASTHMA, UNSPECIFIED (ICD-493.00)    Route: SQ    Site: R deltoid    Exp Date: 02/2013    Lot #: [id number]    Mfr: Salome Spotted    Comments: 1.2 ML IN RIGHT ARM 150MG  CHARGED [id number] AND  35009    Given by: Dimas Millin IN ALLERGY LAB   Orders Added: 1)  Xolair (omalizumab) 150mg  [J2357] 2)  Administration xolair injection [[id number]]

## 2010-04-16 ENCOUNTER — Encounter: Payer: Self-pay | Admitting: Critical Care Medicine

## 2010-04-16 ENCOUNTER — Ambulatory Visit (INDEPENDENT_AMBULATORY_CARE_PROVIDER_SITE_OTHER): Payer: Medicare Other

## 2010-04-16 DIAGNOSIS — J45909 Unspecified asthma, uncomplicated: Secondary | ICD-10-CM

## 2010-04-24 NOTE — Assessment & Plan Note (Signed)
Summary: 1 month xolair ///kp   Nurse Visit   Allergies: 1)  ! Codeine 2)  ! Darvocet  Medication Administration  Injection # 1:    Medication: Xolair (omalizumab) 150mg     Diagnosis: EXTRINSIC ASTHMA, UNSPECIFIED (ICD-493.00)    Route: SQ    Site: L deltoid    Exp Date: 04/2013    Lot #: [id number]    Mfr: Genetech    Comments: 1.67ml in left arm 150mg  charged 150mg  charged j2357 and 81191    Given by: tammy scott in allergy lab  Orders Added: 1)  Xolair (omalizumab) 150mg  [J2357] 2)  Administration xolair injection [[id number]]   Medication Administration  Injection # 1:    Medication: Xolair (omalizumab) 150mg     Diagnosis: EXTRINSIC ASTHMA, UNSPECIFIED (ICD-493.00)    Route: SQ    Site: L deltoid    Exp Date: 04/2013    Lot #: [id number]    Mfr: Genetech    Comments: 1.74ml in left arm 150mg  charged 150mg  charged j2357 and 86578    Given by: tammy scott in allergy lab  Orders Added: 1)  Xolair (omalizumab) 150mg  [J2357] 2)  Administration xolair injection [[id number]]

## 2010-04-25 ENCOUNTER — Other Ambulatory Visit: Payer: Self-pay | Admitting: Internal Medicine

## 2010-05-17 ENCOUNTER — Ambulatory Visit (INDEPENDENT_AMBULATORY_CARE_PROVIDER_SITE_OTHER): Payer: Medicare Other

## 2010-05-17 DIAGNOSIS — J45909 Unspecified asthma, uncomplicated: Secondary | ICD-10-CM

## 2010-05-18 MED ORDER — OMALIZUMAB 150 MG ~~LOC~~ SOLR
150.0000 mg | Freq: Once | SUBCUTANEOUS | Status: AC
  Administered 2010-05-17: 150 mg via SUBCUTANEOUS

## 2010-06-18 ENCOUNTER — Ambulatory Visit (INDEPENDENT_AMBULATORY_CARE_PROVIDER_SITE_OTHER): Payer: Medicare Other

## 2010-06-18 DIAGNOSIS — J45909 Unspecified asthma, uncomplicated: Secondary | ICD-10-CM

## 2010-06-18 MED ORDER — OMALIZUMAB 150 MG ~~LOC~~ SOLR
150.0000 mg | Freq: Once | SUBCUTANEOUS | Status: AC
  Administered 2010-06-18: 150 mg via SUBCUTANEOUS

## 2010-06-22 NOTE — Assessment & Plan Note (Signed)
[facility]                             PULMONARY OFFICE NOTE   NAME:Kathy Whitaker, Kathy Whitaker                           MRN:          [id number]  DATE:04/29/2006                            DOB:          [birth year]    PROBLEM LIST:  1. Asthma.  2. Allergic rhinitis.  3. Esophageal reflux.   HISTORY:  Dr. Delford Field was seeing Ms. Bessinger this afternoon on scheduled  pulmonary followup and sent her over for my evaluation concerned about  complaint of itching.  She has been on Xolair injections once a month  since August of 2007, with no recognized problems and she thinks it has  helped her asthma.  She still needs to use her nebulizer twice a day and  is most symptomatic in spring and fall.  She says Dr. Oneta Rack had  changed her thyroid medication and she has not been as confident since  then that thyroid control was good.  About the time of the medication  change, she became aware of generalized itching.  At times, there is a  rash which by her description is punctate and generalized, although she  has none to demonstrate at this time.  She thinks individual lesions  fade and are replaced.  I talked with her about possible causes of  urticaria and tried to compare this with nonspecific pruritus related  mainly to dry skin and the wintertime.  She recognizes no relationship  to the timing or location of her Xolair injections.  She had not made  other medication changes in that time, except for the thyroid.  There is  no relationship to sun exposed skin.  Itching is intense enough to  interfere with sleep and present virtually every day over the past  month.  She recognizes no relationship to exposures including foods or  clothing.  We did observe that she is using dryer fabric softener sheets  with her laundry and I explained that some people will develop a contact  dermatitis as they wear and sleep on the materials which have been dried  with this sheet.   IMPRESSION:  I think she is describing a punctate urticaria as opposed  to dry skin.  I cannot tell if there is any relationship to the Xolair  injections which have been of at least modest benefit apparently.  She  is not on ACE inhibitors, antibiotics, or estrogen.   PLAN:  1. She is not currently taking an antihistamine so I have given      samples of Clarinex to try one daily.  2. She will talk with Dr. Oneta Rack about the relationship in time      between these symptoms and adjustment of her thyroid medication.      Occasionally, anti-thyroid antibodies have been related to      urticaria.  I have suggested that she negotiate with Dr. Oneta Rack      whether it would be appropriate for her to go back to her previous      thyroid medication for a trial period of perhaps a month to see  whether this rash resolves.  She could then try again with his      preferred preparation and see if there is a connection.  3. We discussed addition of allergy immunotherapy, either together      with or instead of her Xolair injections.  She preferred to wait      through the coming spring season to see how she      does with Xolair alone, so we are scheduling her back to see me for      allergy followup in      three months.  She is going back this afternoon to complete      evaluation with Dr. Delford Field.  I submitted no charge this afternoon.     Clinton D. Maple Hudson, MD, [facility], FACP  Electronically Signed    CDY/MedQ  DD: 04/29/2006  DT: 04/30/2006  Job #: [id number]

## 2010-06-22 NOTE — Assessment & Plan Note (Signed)
[facility]                             PULMONARY OFFICE NOTE   NAME:Kathy Whitaker, Kathy Whitaker                           MRN:          [id number]  DATE:04/29/2006                            DOB:          10-25-1933    Kathy Whitaker is seen today in followup. Is a 77 year old white female with  moderate persistent asthma, significant atopic features, now on Xolair  since August 2007 at 150 mg monthly. Advair is now at 250/50 one spray  b.i.d. We had thought she was on the 500 level of Advair, but she has  actually only been on the 250 Advair. She is off Protonix. She has been  complaining of itching and a rash that comes and goes on the trunk, back  and arms. This has been episodic this fall.   PHYSICAL EXAMINATION:  Temperature 97.0, blood pressure 116/70, pulse is  78. Saturation is 94% on room air.  CHEST: Showed distant breath sounds. No evidence of wheeze or rhonchi.  CARDIAC: Showed a regular rate and rhythm without S3. Normal S1, S2.  ABDOMEN: Soft, nontender.  EXTREMITIES: Showed no edema or clubbing.  SKIN: Was clear. Does not reveal evidence for frank urticaria.  NEUROLOGIC: Was intact.  HEENT: Showed no jugular venous distention. No lymphadenopathy.  Oropharynx was clear.  NECK: Supple.   IMPRESSION:  Impression here is that of moderate persistent asthma with  possible allergic reaction needed to either Xolair or winter dry skin.   PLAN:  The plan is to maintain Xolair as is and increase Advair to  500/50 one spray b.i.d. and refer back to Dr. Maple Hudson for allergy  reevaluation. She will return to this office and followup in one month.     Charlcie Cradle Delford Field, MD, [facility]  Electronically Signed    PEW/MedQ  DD: 04/29/2006  DT: 04/29/2006  Job #: [id number]   cc:   Lucky Cowboy, M.D.

## 2010-06-22 NOTE — Assessment & Plan Note (Signed)
[facility]                               PULMONARY OFFICE NOTE   NAME:Kathy Whitaker, Kathy Whitaker                           MRN:          [id number]  DATE:10/09/2005                            DOB:          [birth year]    ALLERGY CONSULTATION:   PROBLEM:  1. Asthma, moderate, persistent.  2. Allergic rhinitis.  3. Esophageal reflux.   HISTORY:  This is a 77 year old never smoking white female referred through  the courtesy of Dr. Delford Field for allergy evaluation. She has a history of  asthma as noted, associated with an IgE level of 92.  She has just begun  Xolair as of July 12 and this is progressing without difficulty so far.  She  has had appropriate education.  She had been allergy tested and on allergy  vaccine about 40-years ago but does not remember much detail.  She gives a  history of recurrent and perennial asthma with upper airway symptoms  including nasal discharge, sneezing and hoarseness.  Triggers have included  grass and tree pollens in particular but also house dust and molds.  There  have been no recent asthma flares and she feels well today.   HOME ENVIRONMENT:  Two cats which she keeps out of the bedroom and she says  she tolerates (other peoples cats are more apt to cause nasal congestion).  There are no unique exposures and she denies specific problems with foods or  insects.   MEDICATIONS:  1. Levothroid 0.125 mg.  2. Zocor 40 mg.  3. Evista 60 mg.  4. Calcium with vitamin D.  5. Zoloft 50 mg.  6. Protonix 40 mg.  7. Advair 250/50.  8. Combivent inhaler.  9. Home nebulizer with DuoNeb used q.i.d. p.r.n.  10.Loratadine tablets.   ALLERGIES:  Drug intolerant to DARVON and CODEINE.   FAMILY HISTORY:  A son with hay-fever.   MEDICAL HISTORY:  1. Hyperlipidemia.  2. Hypothyroidism.  3. Asthma with chronic obstructive pulmonary disease.  4. Surgery for tonsillectomy.  5. Normal left ventricular function on echocardiogram in 2006  with nuclear      stress test in 2006 showing ejection fraction 73%.   OBJECTIVE:  VITAL SIGNS:  Weight 147 pounds.  Blood pressure 106/68.  Pulse  regular 80.  Room air saturation 95%.  HEART:  Heart sounds regular without murmur.  LUNGS:  Lung fields clear to P&A.  HEENT:  Conjunctivae were not injected.  Nasal mucosa was wet but not  edematous or obstructed.  Scant mucous was clear.  There were no polyps.  Oropharynx was clear.  I found no adenopathy or edema.  SKIN TEST:  Positive histamine.  Negative _________  controls.  Significant  positive puncture reactions, particularly for grass, weed and tree pollens,  and endodermals.  Intradermal testing was not needed.   IMPRESSION:  1. Allergic rhinitis.  2. Moderate asthma.  3. Reflux.  4. Strong atopic component is further supported with positive allergy skin      testing.  Since she has just started Xolair and currently is doing  quite well, I am not starting specific therapy.  We did give      information on environmental precautions, especially dust mite and      avoidable external factors.  Allergy vaccine can be offered if required      at any time.  She is doing well enough with Xolair now that there is      not a reason to add routine allergy vaccine in addition.  She was      comfortable with this approach.   PLAN:  Per considerations above, I am scheduling her to return to me as  needed for allergy followup.  If control is inadequate, we can talk with her  about available tools including allergy vaccine as appropriate.  I  appreciate the chance to see her.                                   Clinton D. Maple Hudson, MD, FCCP, FACP   CDY/MedQ  DD:  10/11/2005  DT:  10/11/2005  Job #:  [id number]   cc:   Charlcie Cradle. Delford Field, MD, [facility]  Lucky Cowboy, M.D.

## 2010-07-19 ENCOUNTER — Ambulatory Visit (INDEPENDENT_AMBULATORY_CARE_PROVIDER_SITE_OTHER): Payer: Medicare Other

## 2010-07-19 DIAGNOSIS — J45909 Unspecified asthma, uncomplicated: Secondary | ICD-10-CM

## 2010-07-24 MED ORDER — OMALIZUMAB 150 MG ~~LOC~~ SOLR
150.0000 mg | Freq: Once | SUBCUTANEOUS | Status: AC
  Administered 2010-07-24: 150 mg via SUBCUTANEOUS

## 2010-08-17 ENCOUNTER — Other Ambulatory Visit: Payer: Self-pay | Admitting: Obstetrics and Gynecology

## 2010-08-17 DIAGNOSIS — N6320 Unspecified lump in the left breast, unspecified quadrant: Secondary | ICD-10-CM

## 2010-08-20 ENCOUNTER — Ambulatory Visit (INDEPENDENT_AMBULATORY_CARE_PROVIDER_SITE_OTHER): Payer: Medicare Other

## 2010-08-20 DIAGNOSIS — J45909 Unspecified asthma, uncomplicated: Secondary | ICD-10-CM

## 2010-08-20 MED ORDER — OMALIZUMAB 150 MG ~~LOC~~ SOLR
150.0000 mg | Freq: Once | SUBCUTANEOUS | Status: AC
  Administered 2010-08-20: 150 mg via SUBCUTANEOUS

## 2010-08-27 ENCOUNTER — Other Ambulatory Visit: Payer: Self-pay | Admitting: *Deleted

## 2010-08-27 MED ORDER — IPRATROPIUM-ALBUTEROL 18-103 MCG/ACT IN AERO: 2.0000 | INHALATION_SPRAY | Freq: Four times a day (QID) | RESPIRATORY_TRACT | Status: DC | PRN

## 2010-09-10 ENCOUNTER — Ambulatory Visit
Admission: RE | Admit: 2010-09-10 | Discharge: 2010-09-10 | Disposition: A | Discharge status: Home / Self Care | Payer: Medicare Other | Source: Ambulatory Visit | Attending: Obstetrics and Gynecology | Admitting: Obstetrics and Gynecology

## 2010-09-10 DIAGNOSIS — N6320 Unspecified lump in the left breast, unspecified quadrant: Secondary | ICD-10-CM

## 2010-09-19 ENCOUNTER — Ambulatory Visit (INDEPENDENT_AMBULATORY_CARE_PROVIDER_SITE_OTHER): Payer: Medicare Other

## 2010-09-19 DIAGNOSIS — J45909 Unspecified asthma, uncomplicated: Secondary | ICD-10-CM

## 2010-09-20 MED ORDER — OMALIZUMAB 150 MG ~~LOC~~ SOLR
150.0000 mg | Freq: Once | SUBCUTANEOUS | Status: AC
  Administered 2010-09-20: 150 mg via SUBCUTANEOUS

## 2010-10-22 ENCOUNTER — Ambulatory Visit (INDEPENDENT_AMBULATORY_CARE_PROVIDER_SITE_OTHER): Payer: Medicare Other

## 2010-10-22 DIAGNOSIS — J45909 Unspecified asthma, uncomplicated: Secondary | ICD-10-CM

## 2010-10-22 MED ORDER — OMALIZUMAB 150 MG ~~LOC~~ SOLR
150.0000 mg | Freq: Once | SUBCUTANEOUS | Status: AC
  Administered 2010-10-22: 150 mg via SUBCUTANEOUS

## 2010-11-22 ENCOUNTER — Ambulatory Visit (INDEPENDENT_AMBULATORY_CARE_PROVIDER_SITE_OTHER): Payer: Medicare Other

## 2010-11-22 DIAGNOSIS — J45909 Unspecified asthma, uncomplicated: Secondary | ICD-10-CM

## 2010-11-23 DIAGNOSIS — J45909 Unspecified asthma, uncomplicated: Secondary | ICD-10-CM

## 2010-11-23 MED ORDER — OMALIZUMAB 150 MG ~~LOC~~ SOLR
150.0000 mg | Freq: Once | SUBCUTANEOUS | Status: AC
  Administered 2010-11-23: 150 mg via SUBCUTANEOUS

## 2010-12-06 ENCOUNTER — Encounter: Payer: Self-pay | Admitting: *Deleted

## 2010-12-06 ENCOUNTER — Encounter: Payer: Self-pay | Admitting: Critical Care Medicine

## 2010-12-07 ENCOUNTER — Encounter: Payer: Self-pay | Admitting: Critical Care Medicine

## 2010-12-07 ENCOUNTER — Ambulatory Visit (INDEPENDENT_AMBULATORY_CARE_PROVIDER_SITE_OTHER): Payer: Medicare Other | Admitting: Critical Care Medicine

## 2010-12-07 DIAGNOSIS — J45909 Unspecified asthma, uncomplicated: Secondary | ICD-10-CM

## 2010-12-07 MED ORDER — ARFORMOTEROL TARTRATE 15 MCG/2ML IN NEBU: 15.0000 ug | INHALATION_SOLUTION | Freq: Two times a day (BID) | RESPIRATORY_TRACT | Status: DC

## 2010-12-07 MED ORDER — BUDESONIDE 0.25 MG/2ML IN SUSP: 0.2500 mg | Freq: Two times a day (BID) | RESPIRATORY_TRACT | Status: DC

## 2010-12-07 NOTE — Progress Notes (Signed)
Subjective:    Patient ID: Kathy Whitaker, female    DOB: 05/25/1933, 77 y.o.   MRN: [id number]  HPI 77 y.o.  WF with severe atopic asthma    12/07/2010 Pt doing well now, was worse in sept. Asthma History: Symptoms 0-2 days/week Nighttime Awakenings 0-2/month Asthma interference with normal activity Minor limitations SABA use (not for EIB) > 2 days/wk--not > 1 x/day Risk: Exacerbations requiring oral systemic steroids 0-1 / year  Past Medical History  Diagnosis Date  . Asthma   . GERD (gastroesophageal reflux disease)   . Hyperlipemia   . Hypothyroidism      History reviewed. No pertinent family history.   History   Social History  . Marital Status: Married    Spouse Name: N/A    Number of Children: N/A  . Years of Education: N/A   Occupational History  . Not on file.   Social History Main Topics  . Smoking status: Never Smoker   . Smokeless tobacco: Never Used  . Alcohol Use: Yes     Wine daily  . Drug Use: Not on file  . Sexually Active: Not on file   Other Topics Concern  . Not on file   Social History Narrative  . No narrative on file     Allergies  Allergen Reactions  . Codeine   . Propoxyphene N-Acetaminophen      Outpatient Prescriptions Prior to Visit  Medication Sig Dispense Refill  . albuterol-ipratropium (COMBIVENT) 18-103 MCG/ACT inhaler Inhale 2 puffs into the lungs every 6 (six) hours as needed for wheezing.  3 Inhaler  0  . calcium-vitamin D (CALCIUM 500/D) 500-200 MG-UNIT per tablet Take 1 tablet by mouth daily.        . cholecalciferol (VITAMIN D) 1000 UNITS tablet Take 1,000 Units by mouth daily.        [name] EPINEPHrine (EPIPEN JR) 0.15 MG/0.3ML injection Inject 0.15 mg into the muscle as needed.        [name] levothyroxine (SYNTHROID, LEVOTHROID) 75 MCG tablet Take 75 mcg by mouth daily.        [name] loratadine (CLARITIN) 10 MG tablet Take 10 mg by mouth daily.        [name] omalizumab (XOLAIR) 150 MG injection Inject 150 mg into the skin every 28  (twenty-eight) days.        [name] sertraline (ZOLOFT) 50 MG tablet Take 50 mg by mouth daily.        . simvastatin (ZOCOR) 40 MG tablet Take 40 mg by mouth at bedtime.        [name] arformoterol (BROVANA) 15 MCG/2ML NEBU Take 15 mcg by nebulization. 1-2 times daily      . budesonide (PULMICORT) 0.25 MG/2ML nebulizer solution Take 0.25 mg by nebulization. 1-2 times daily          Review of Systems Constitutional:   No  weight loss, night sweats,  Fevers, chills, fatigue, lassitude. HEENT:   No headaches,  Difficulty swallowing,  Tooth/dental problems,  Sore throat,                No sneezing, itching, ear ache, nasal congestion, post nasal drip,   CV:  No chest pain,  Orthopnea, PND, swelling in lower extremities, anasarca, dizziness, palpitations  GI  No heartburn, indigestion, abdominal pain, nausea, vomiting, diarrhea, change in bowel habits, loss of appetite  Resp: No shortness of breath with exertion or at rest.  No excess mucus, no productive cough,  No non-productive cough,  No  coughing up of blood.  No change in color of mucus.  No wheezing.  No chest wall deformity  Skin: no rash or lesions.  GU: no dysuria, change in color of urine, no urgency or frequency.  No flank pain.  MS:  No joint pain or swelling.  No decreased range of motion.  No back pain.  Psych:  No change in mood or affect. No depression or anxiety.  No memory loss.     Objective:   Physical Exam Filed Vitals:   12/07/10 1159  BP: 104/70  Pulse: 73  Temp: 98.2 F (36.8 C)  TempSrc: Oral  Height: 5' (1.524 m)  Weight: 142 lb 9.6 oz (64.683 kg)  SpO2: 94%    Gen: Pleasant, well-nourished, in no distress,  normal affect  ENT: No lesions,  mouth clear,  oropharynx clear, no postnasal drip  Neck: No JVD, no TMG, no carotid bruits  Lungs: No use of accessory muscles, no dullness to percussion, distant BS  Cardiovascular: RRR, heart sounds normal, no murmur or gallops, no peripheral edema  Abdomen: soft and  NT, no HSM,  BS normal  Musculoskeletal: No deformities, no cyanosis or clubbing  Neuro: alert, non focal  Skin: Warm, no lesions or rashes        Assessment & Plan:   EXTRINSIC ASTHMA, UNSPECIFIED Severe persistent asthma with improvement using nebulized long-acting bronchodilator and inhaled steroid along with Xolair therapy Plan Continue current inhaled medications as prescribed Continue Xolair    Updated Medication List Outpatient Encounter Prescriptions as of 12/07/2010  Medication Sig Dispense Refill  . albuterol-ipratropium (COMBIVENT) 18-103 MCG/ACT inhaler Inhale 2 puffs into the lungs every 6 (six) hours as needed for wheezing.  3 Inhaler  0  . arformoterol (BROVANA) 15 MCG/2ML NEBU Take 2 mLs (15 mcg total) by nebulization 2 (two) times daily.  120 mL    . budesonide (PULMICORT) 0.25 MG/2ML nebulizer solution Take 2 mLs (0.25 mg total) by nebulization 2 (two) times daily.  60 mL    . calcium-vitamin D (CALCIUM 500/D) 500-200 MG-UNIT per tablet Take 1 tablet by mouth daily.        . cholecalciferol (VITAMIN D) 1000 UNITS tablet Take 1,000 Units by mouth daily.        [name] EPINEPHrine (EPIPEN JR) 0.15 MG/0.3ML injection Inject 0.15 mg into the muscle as needed.        [name] levothyroxine (SYNTHROID, LEVOTHROID) 75 MCG tablet Take 75 mcg by mouth daily.        [name] loratadine (CLARITIN) 10 MG tablet Take 10 mg by mouth daily.        [name] omalizumab (XOLAIR) 150 MG injection Inject 150 mg into the skin every 28 (twenty-eight) days.        [name] sertraline (ZOLOFT) 50 MG tablet Take 50 mg by mouth daily.        . simvastatin (ZOCOR) 40 MG tablet Take 40 mg by mouth at bedtime.        [name] DISCONTD: arformoterol (BROVANA) 15 MCG/2ML NEBU Take 15 mcg by nebulization. 1-2 times daily      . DISCONTD: budesonide (PULMICORT) 0.25 MG/2ML nebulizer solution Take 0.25 mg by nebulization. 1-2 times daily

## 2010-12-07 NOTE — Assessment & Plan Note (Signed)
Severe persistent asthma with improvement using nebulized long-acting bronchodilator and inhaled steroid along with Xolair therapy Plan Continue current inhaled medications as prescribed Continue Xolair

## 2010-12-07 NOTE — Patient Instructions (Signed)
No change in medications. Return in        6 months        

## 2010-12-24 ENCOUNTER — Ambulatory Visit (INDEPENDENT_AMBULATORY_CARE_PROVIDER_SITE_OTHER): Payer: Medicare Other

## 2010-12-24 DIAGNOSIS — J45909 Unspecified asthma, uncomplicated: Secondary | ICD-10-CM

## 2010-12-25 DIAGNOSIS — J45909 Unspecified asthma, uncomplicated: Secondary | ICD-10-CM

## 2010-12-25 MED ORDER — OMALIZUMAB 150 MG ~~LOC~~ SOLR
150.0000 mg | Freq: Once | SUBCUTANEOUS | Status: AC
  Administered 2010-12-25: 150 mg via SUBCUTANEOUS

## 2011-01-23 ENCOUNTER — Ambulatory Visit (INDEPENDENT_AMBULATORY_CARE_PROVIDER_SITE_OTHER): Payer: Medicare Other

## 2011-01-23 DIAGNOSIS — J45909 Unspecified asthma, uncomplicated: Secondary | ICD-10-CM

## 2011-01-23 MED ORDER — OMALIZUMAB 150 MG ~~LOC~~ SOLR
150.0000 mg | Freq: Once | SUBCUTANEOUS | Status: AC
  Administered 2011-01-23: 150 mg via SUBCUTANEOUS

## 2011-02-25 ENCOUNTER — Ambulatory Visit (INDEPENDENT_AMBULATORY_CARE_PROVIDER_SITE_OTHER): Payer: Medicare Other

## 2011-02-25 DIAGNOSIS — J45909 Unspecified asthma, uncomplicated: Secondary | ICD-10-CM

## 2011-02-27 DIAGNOSIS — J45909 Unspecified asthma, uncomplicated: Secondary | ICD-10-CM | POA: Diagnosis not present

## 2011-02-27 MED ORDER — OMALIZUMAB 150 MG ~~LOC~~ SOLR
150.0000 mg | Freq: Once | SUBCUTANEOUS | Status: AC
  Administered 2011-02-27: 150 mg via SUBCUTANEOUS

## 2011-03-28 ENCOUNTER — Ambulatory Visit (INDEPENDENT_AMBULATORY_CARE_PROVIDER_SITE_OTHER): Payer: Medicare Other

## 2011-03-28 DIAGNOSIS — J45909 Unspecified asthma, uncomplicated: Secondary | ICD-10-CM | POA: Diagnosis not present

## 2011-03-28 MED ORDER — OMALIZUMAB 150 MG ~~LOC~~ SOLR
150.0000 mg | Freq: Once | SUBCUTANEOUS | Status: AC
  Administered 2011-03-28: 150 mg via SUBCUTANEOUS

## 2011-04-24 ENCOUNTER — Ambulatory Visit (INDEPENDENT_AMBULATORY_CARE_PROVIDER_SITE_OTHER): Payer: Medicare Other

## 2011-04-24 DIAGNOSIS — J45909 Unspecified asthma, uncomplicated: Secondary | ICD-10-CM

## 2011-04-24 MED ORDER — OMALIZUMAB 150 MG ~~LOC~~ SOLR
150.0000 mg | Freq: Once | SUBCUTANEOUS | Status: AC
  Administered 2011-04-24: 150 mg via SUBCUTANEOUS

## 2011-04-25 ENCOUNTER — Ambulatory Visit: Payer: Medicare Other

## 2011-05-24 ENCOUNTER — Ambulatory Visit (INDEPENDENT_AMBULATORY_CARE_PROVIDER_SITE_OTHER): Payer: Medicare Other

## 2011-05-24 DIAGNOSIS — J45909 Unspecified asthma, uncomplicated: Secondary | ICD-10-CM | POA: Diagnosis not present

## 2011-05-24 MED ORDER — OMALIZUMAB 150 MG ~~LOC~~ SOLR
150.0000 mg | Freq: Once | SUBCUTANEOUS | Status: AC
  Administered 2011-05-24: 150 mg via SUBCUTANEOUS

## 2011-06-11 DIAGNOSIS — Z79899 Other long term (current) drug therapy: Secondary | ICD-10-CM | POA: Diagnosis not present

## 2011-06-11 DIAGNOSIS — R002 Palpitations: Secondary | ICD-10-CM | POA: Diagnosis not present

## 2011-06-11 DIAGNOSIS — R7309 Other abnormal glucose: Secondary | ICD-10-CM | POA: Diagnosis not present

## 2011-06-11 DIAGNOSIS — E559 Vitamin D deficiency, unspecified: Secondary | ICD-10-CM | POA: Diagnosis not present

## 2011-06-11 DIAGNOSIS — I10 Essential (primary) hypertension: Secondary | ICD-10-CM | POA: Diagnosis not present

## 2011-06-11 DIAGNOSIS — E782 Mixed hyperlipidemia: Secondary | ICD-10-CM | POA: Diagnosis not present

## 2011-06-24 ENCOUNTER — Ambulatory Visit (INDEPENDENT_AMBULATORY_CARE_PROVIDER_SITE_OTHER): Payer: Medicare Other

## 2011-06-24 DIAGNOSIS — J45909 Unspecified asthma, uncomplicated: Secondary | ICD-10-CM | POA: Diagnosis not present

## 2011-06-25 DIAGNOSIS — I10 Essential (primary) hypertension: Secondary | ICD-10-CM | POA: Diagnosis not present

## 2011-06-25 DIAGNOSIS — R7309 Other abnormal glucose: Secondary | ICD-10-CM | POA: Diagnosis not present

## 2011-06-25 DIAGNOSIS — Z1212 Encounter for screening for malignant neoplasm of rectum: Secondary | ICD-10-CM | POA: Diagnosis not present

## 2011-06-25 DIAGNOSIS — R002 Palpitations: Secondary | ICD-10-CM | POA: Diagnosis not present

## 2011-06-25 DIAGNOSIS — E782 Mixed hyperlipidemia: Secondary | ICD-10-CM | POA: Diagnosis not present

## 2011-06-25 MED ORDER — OMALIZUMAB 150 MG ~~LOC~~ SOLR
150.0000 mg | Freq: Once | SUBCUTANEOUS | Status: AC
  Administered 2011-06-25: 150 mg via SUBCUTANEOUS

## 2011-07-04 ENCOUNTER — Ambulatory Visit (HOSPITAL_COMMUNITY): Payer: Medicare Other | Attending: Cardiovascular Disease | Admitting: Radiology

## 2011-07-04 VITALS — BP 120/75 | Ht 60.0 in | Wt 141.0 lb

## 2011-07-04 DIAGNOSIS — I10 Essential (primary) hypertension: Secondary | ICD-10-CM | POA: Diagnosis not present

## 2011-07-04 DIAGNOSIS — R0602 Shortness of breath: Secondary | ICD-10-CM

## 2011-07-04 DIAGNOSIS — R002 Palpitations: Secondary | ICD-10-CM | POA: Insufficient documentation

## 2011-07-04 DIAGNOSIS — R079 Chest pain, unspecified: Secondary | ICD-10-CM | POA: Diagnosis not present

## 2011-07-04 DIAGNOSIS — J449 Chronic obstructive pulmonary disease, unspecified: Secondary | ICD-10-CM | POA: Diagnosis not present

## 2011-07-04 DIAGNOSIS — R5381 Other malaise: Secondary | ICD-10-CM | POA: Insufficient documentation

## 2011-07-04 DIAGNOSIS — E785 Hyperlipidemia, unspecified: Secondary | ICD-10-CM | POA: Diagnosis not present

## 2011-07-04 DIAGNOSIS — J4489 Other specified chronic obstructive pulmonary disease: Secondary | ICD-10-CM | POA: Insufficient documentation

## 2011-07-04 MED ORDER — TECHNETIUM TC 99M TETROFOSMIN IV KIT
32.6000 | PACK | Freq: Once | INTRAVENOUS | Status: AC | PRN
  Administered 2011-07-04: 33 via INTRAVENOUS

## 2011-07-04 MED ORDER — TECHNETIUM TC 99M TETROFOSMIN IV KIT
11.0000 | PACK | Freq: Once | INTRAVENOUS | Status: AC | PRN
  Administered 2011-07-04: 11 via INTRAVENOUS

## 2011-07-04 NOTE — Progress Notes (Signed)
[facility] SITE 3 NUCLEAR MED [address] [place] [address] [phone]  Cardiology Nuclear Med Study  Kathy Whitaker is a 78 y.o. female     MRN : [id number]     DOB: 1934/01/10  Procedure Date: 07/04/2011  Nuclear Med Background Indication for Stress Test:  Evaluation for Ischemia History:  Asthma and COPD Cardiac Risk Factors: Hypertension and Lipids  Symptoms:  Chest Pain, Fatigue and Palpitations   Nuclear Pre-Procedure Caffeine/Decaff Intake:  None . 12 hrs > 12 hrsNPO After: 8:00pm   Lungs:  clear O2 Sat: 97% on room air. IV 0.9% NS with Angio Cath:  22g  IV Site: R Antecubital x 1, tolerated well IV Started by:  Irean Hong, RN  Chest Size (in):  36 Cup Size: C  Height: 5' (1.524 m)  Weight:  141 lb (63.957 kg)  BMI:  Body mass index is 27.54 kg/(m^2). Tech Comments:  Nebulizer treatment at home at 7:00am per patient    Nuclear Med Study 1 or 2 day study: 1 day  Stress Test Type:  Stress  Reading MD: Charlton Haws, MD  Order Authorizing Provider:  Lucky Cowboy, MD  Resting Radionuclide: Technetium 22m Tetrofosmin  Resting Radionuclide Dose: 11.0 mCi   Stress Radionuclide:  Technetium 60m Tetrofosmin  Stress Radionuclide Dose: 32.6 mCi           Stress Protocol Rest HR: 65 Stress HR: 134  Rest BP: 120/75 Stress BP: 169/61  Exercise Time (min): 6:00 METS: 7.0   Predicted Max HR: 145 bpm % Max HR: 92.41 bpm Rate Pressure Product: 29528   Dose of Adenosine (mg):  n/a Dose of Lexiscan: n/a mg  Dose of Atropine (mg): n/a Dose of Dobutamine: n/a mcg/kg/min (at max HR)  Stress Test Technologist: Milana Na, EMT-P  Nuclear Technologist:  Domenic Polite, CNMT     Rest Procedure:  Myocardial perfusion imaging was performed at rest 45 minutes following the intravenous administration of Technetium 94m Tetrofosmin. Rest ECG: NSR - Normal EKG  Stress Procedure:  The patient performed treadmill exercise using a Bruce  Protocol for 6:00  minutes. The patient stopped due to sob, fatigue and denied any chest pain.  There were no significant ST-T wave changes and sob with exercise.  Technetium 57m Tetrofosmin was injected at peak exercise and myocardial perfusion imaging was performed after a brief delay. Stress ECG: No significant change from baseline ECG  QPS Raw Data Images:  Normal; no motion artifact; normal heart/lung ratio. Stress Images:  Normal homogeneous uptake in all areas of the myocardium. Rest Images:  Normal homogeneous uptake in all areas of the myocardium. Subtraction (SDS):  Normal Transient Ischemic Dilatation (Normal <1.22):  1.04 Lung/Heart Ratio (Normal <0.45):  .26  Quantitative Gated Spect Images QGS EDV:  58 ml QGS ESV:  19 ml  Impression Exercise Capacity:  Fair exercise capacity. BP Response:  Normal blood pressure response. Clinical Symptoms:  There is dyspnea. ECG Impression:  No significant ST segment change suggestive of ischemia. Comparison with Prior Nuclear Study: No images to compare  Overall Impression:  Normal stress nuclear study.  LV Ejection Fraction: 68%.  LV Wall Motion:  NL LV Function; NL Wall Motion   Charlton Haws

## 2011-07-05 NOTE — Progress Notes (Signed)
Copy of Nuclear Report Faxed to Dr. Anne Fu @[phone].[place]ar[place] Ar

## 2011-07-25 ENCOUNTER — Ambulatory Visit (INDEPENDENT_AMBULATORY_CARE_PROVIDER_SITE_OTHER): Payer: Medicare Other

## 2011-07-25 DIAGNOSIS — J45909 Unspecified asthma, uncomplicated: Secondary | ICD-10-CM

## 2011-07-25 MED ORDER — OMALIZUMAB 150 MG ~~LOC~~ SOLR
150.0000 mg | Freq: Once | SUBCUTANEOUS | Status: AC
  Administered 2011-07-25: 150 mg via SUBCUTANEOUS

## 2011-08-26 ENCOUNTER — Ambulatory Visit (INDEPENDENT_AMBULATORY_CARE_PROVIDER_SITE_OTHER): Payer: Medicare Other

## 2011-08-26 DIAGNOSIS — J45909 Unspecified asthma, uncomplicated: Secondary | ICD-10-CM | POA: Diagnosis not present

## 2011-08-27 MED ORDER — OMALIZUMAB 150 MG ~~LOC~~ SOLR
150.0000 mg | Freq: Once | SUBCUTANEOUS | Status: AC
  Administered 2011-08-27: 150 mg via SUBCUTANEOUS

## 2011-09-26 ENCOUNTER — Ambulatory Visit (INDEPENDENT_AMBULATORY_CARE_PROVIDER_SITE_OTHER): Payer: Medicare Other

## 2011-09-26 DIAGNOSIS — J45909 Unspecified asthma, uncomplicated: Secondary | ICD-10-CM | POA: Diagnosis not present

## 2011-09-26 MED ORDER — OMALIZUMAB 150 MG ~~LOC~~ SOLR
150.0000 mg | Freq: Once | SUBCUTANEOUS | Status: AC
  Administered 2011-09-26: 150 mg via SUBCUTANEOUS

## 2011-10-03 DIAGNOSIS — L708 Other acne: Secondary | ICD-10-CM | POA: Diagnosis not present

## 2011-10-03 DIAGNOSIS — D485 Neoplasm of uncertain behavior of skin: Secondary | ICD-10-CM | POA: Diagnosis not present

## 2011-10-16 ENCOUNTER — Encounter: Payer: Self-pay | Admitting: Critical Care Medicine

## 2011-10-16 ENCOUNTER — Ambulatory Visit (INDEPENDENT_AMBULATORY_CARE_PROVIDER_SITE_OTHER): Payer: Medicare Other | Admitting: Critical Care Medicine

## 2011-10-16 VITALS — BP 128/70 | HR 62 | Temp 98.0°F | Ht 60.0 in | Wt 144.8 lb

## 2011-10-16 DIAGNOSIS — J45909 Unspecified asthma, uncomplicated: Secondary | ICD-10-CM

## 2011-10-16 NOTE — Patient Instructions (Addendum)
You deferred the flu vaccine today. Please reconsider and get your flu vaccine this fall. No change in medications. Return in       6 months Use the mouthpiece PARI LC system for your nebulizer, if it works, call us for a refill

## 2011-10-16 NOTE — Progress Notes (Signed)
Subjective:    Patient ID: Kathy Whitaker, female    DOB: 15-May-1933, 78 y.o.   MRN: [id number]  HPI  78 y.o.  WF with severe atopic asthma   10/16/2011 Since last ov doing well PUL ASTHMA HISTORY 10/16/2011 12/07/2010  Symptoms 0-2 days/week 0-2 days/week  Nighttime awakenings 0-2/month 0-2/month  Interference with activity Minor limitations Minor limitations  SABA use 0-2 days/wk > 2 days/wk--not > 1 x/day  Exacerbations requiring oral steroids 0-1 / year 0-1 / year     Past Medical History  Diagnosis Date  . Asthma   . GERD (gastroesophageal reflux disease)   . Hyperlipemia   . Hypothyroidism      History reviewed. No pertinent family history.   History   Social History  . Marital Status: Married    Spouse Name: N/A    Number of Children: N/A  . Years of Education: N/A   Occupational History  . Not on file.   Social History Main Topics  . Smoking status: Never Smoker   . Smokeless tobacco: Never Used  . Alcohol Use: Yes     Wine daily  . Drug Use: Not on file  . Sexually Active: Not on file   Other Topics Concern  . Not on file   Social History Narrative  . No narrative on file     Allergies  Allergen Reactions  . Codeine   . Propoxyphene-Acetaminophen   . Shellfish-Derived Products      Outpatient Prescriptions Prior to Visit  Medication Sig Dispense Refill  . albuterol-ipratropium (COMBIVENT) 18-103 MCG/ACT inhaler Inhale 2 puffs into the lungs every 6 (six) hours as needed for wheezing.  3 Inhaler  0  . calcium-vitamin D (CALCIUM 500/D) 500-200 MG-UNIT per tablet Take 1 tablet by mouth daily.        . cholecalciferol (VITAMIN D) 1000 UNITS tablet Take 1,000 Units by mouth daily.        [name] EPINEPHrine (EPIPEN JR) 0.15 MG/0.3ML injection Inject 0.15 mg into the muscle as needed.        [name] levothyroxine (SYNTHROID, LEVOTHROID) 75 MCG tablet Take 75 mcg by mouth daily.        [name] loratadine (CLARITIN) 10 MG tablet Take 10 mg by mouth daily.        [name]  omalizumab (XOLAIR) 150 MG injection Inject 150 mg into the skin every 28 (twenty-eight) days.        [name] sertraline (ZOLOFT) 50 MG tablet Take 50 mg by mouth daily.        . simvastatin (ZOCOR) 40 MG tablet Take 40 mg by mouth at bedtime.        . budesonide (PULMICORT) 0.25 MG/2ML nebulizer solution Take 2 mLs (0.25 mg total) by nebulization 2 (two) times daily.  60 mL    . arformoterol (BROVANA) 15 MCG/2ML NEBU Take 2 mLs (15 mcg total) by nebulization 2 (two) times daily.  120 mL        Review of Systems  Constitutional:   No  weight loss, night sweats,  Fevers, chills, fatigue, lassitude. HEENT:   No headaches,  Difficulty swallowing,  Tooth/dental problems,  Sore throat,                No sneezing, itching, ear ache, nasal congestion, post nasal drip,   CV:  No chest pain,  Orthopnea, PND, swelling in lower extremities, anasarca, dizziness, palpitations  GI  No heartburn, indigestion, abdominal pain, nausea, vomiting, diarrhea, change in bowel habits, loss  of appetite  Resp: No shortness of breath with exertion or at rest.  No excess mucus, no productive cough,  No non-productive cough,  No coughing up of blood.  No change in color of mucus.  No wheezing.  No chest wall deformity  Skin: no rash or lesions.  GU: no dysuria, change in color of urine, no urgency or frequency.  No flank pain.  MS:  No joint pain or swelling.  No decreased range of motion.  No back pain.  Psych:  No change in mood or affect. No depression or anxiety.  No memory loss.     Objective:   Physical Exam  Filed Vitals:   10/16/11 1034  BP: 128/70  Pulse: 62  Temp: 98 F (36.7 C)  TempSrc: Oral  Height: 5' (1.524 m)  Weight: 65.681 kg (144 lb 12.8 oz)  SpO2: 96%    Gen: Pleasant, well-nourished, in no distress,  normal affect  ENT: No lesions,  mouth clear,  oropharynx clear, no postnasal drip  Neck: No JVD, no TMG, no carotid bruits  Lungs: No use of accessory muscles, no dullness to  percussion, distant BS  Cardiovascular: RRR, heart sounds normal, no murmur or gallops, no peripheral edema  Abdomen: soft and NT, no HSM,  BS normal  Musculoskeletal: No deformities, no cyanosis or clubbing  Neuro: alert, non focal  Skin: Warm, no lesions or rashes        Assessment & Plan:   Extrinsic asthma, unspecified Severe persistent asthma stable at this time on long-acting bronchodilator and nebulized steroid Xolair therapy has been beneficial Patient is recommended a flu vaccine but the patient declined at this visit Plan Continue inhaled medications as prescribed Continue Xolair therapy Return 6 months     Updated Medication List Outpatient Encounter Prescriptions as of 10/16/2011  Medication Sig Dispense Refill  . albuterol-ipratropium (COMBIVENT) 18-103 MCG/ACT inhaler Inhale 2 puffs into the lungs every 6 (six) hours as needed for wheezing.  3 Inhaler  0  . budesonide (PULMICORT) 0.25 MG/2ML nebulizer solution Take 0.25 mg by nebulization. Once daily and will take again if needed      . calcium-vitamin D (CALCIUM 500/D) 500-200 MG-UNIT per tablet Take 1 tablet by mouth daily.        . cholecalciferol (VITAMIN D) 1000 UNITS tablet Take 1,000 Units by mouth daily.        [name] EPINEPHrine (EPIPEN JR) 0.15 MG/0.3ML injection Inject 0.15 mg into the muscle as needed.        . formoterol (PERFOROMIST) 20 MCG/2ML nebulizer solution Take 20 mcg by nebulization. Once daily and again if needed      . levothyroxine (SYNTHROID, LEVOTHROID) 75 MCG tablet Take 75 mcg by mouth daily.        [name] loratadine (CLARITIN) 10 MG tablet Take 10 mg by mouth daily.        [name] omalizumab (XOLAIR) 150 MG injection Inject 150 mg into the skin every 28 (twenty-eight) days.        [name] sertraline (ZOLOFT) 50 MG tablet Take 50 mg by mouth daily.        . simvastatin (ZOCOR) 40 MG tablet Take 40 mg by mouth at bedtime.        [name] DISCONTD: budesonide (PULMICORT) 0.25 MG/2ML nebulizer solution Take 2 mLs  (0.25 mg total) by nebulization 2 (two) times daily.  60 mL    . DISCONTD: arformoterol (BROVANA) 15 MCG/2ML NEBU Take 2 mLs (15 mcg total) by nebulization 2 (  two) times daily.  120 mL

## 2011-10-16 NOTE — Assessment & Plan Note (Signed)
Severe persistent asthma stable at this time on long-acting bronchodilator and nebulized steroid Xolair therapy has been beneficial Patient is recommended a flu vaccine but the patient declined at this visit Plan Continue inhaled medications as prescribed Continue Xolair therapy Return 6 months

## 2011-10-29 ENCOUNTER — Ambulatory Visit (INDEPENDENT_AMBULATORY_CARE_PROVIDER_SITE_OTHER): Payer: Medicare Other

## 2011-10-29 ENCOUNTER — Ambulatory Visit: Payer: Medicare Other

## 2011-10-29 DIAGNOSIS — J45909 Unspecified asthma, uncomplicated: Secondary | ICD-10-CM

## 2011-10-31 DIAGNOSIS — J45909 Unspecified asthma, uncomplicated: Secondary | ICD-10-CM

## 2011-10-31 MED ORDER — OMALIZUMAB 150 MG ~~LOC~~ SOLR
150.0000 mg | Freq: Once | SUBCUTANEOUS | Status: AC
  Administered 2011-10-31: 150 mg via SUBCUTANEOUS

## 2011-11-20 DIAGNOSIS — Z124 Encounter for screening for malignant neoplasm of cervix: Secondary | ICD-10-CM | POA: Diagnosis not present

## 2011-11-20 DIAGNOSIS — Z23 Encounter for immunization: Secondary | ICD-10-CM | POA: Diagnosis not present

## 2011-11-29 ENCOUNTER — Ambulatory Visit (INDEPENDENT_AMBULATORY_CARE_PROVIDER_SITE_OTHER): Payer: Medicare Other

## 2011-11-29 ENCOUNTER — Ambulatory Visit: Payer: Medicare Other

## 2011-11-29 DIAGNOSIS — J45909 Unspecified asthma, uncomplicated: Secondary | ICD-10-CM | POA: Diagnosis not present

## 2011-12-03 MED ORDER — OMALIZUMAB 150 MG ~~LOC~~ SOLR
150.0000 mg | Freq: Once | SUBCUTANEOUS | Status: AC
  Administered 2011-12-03: 150 mg via SUBCUTANEOUS

## 2011-12-05 DIAGNOSIS — R141 Gas pain: Secondary | ICD-10-CM | POA: Diagnosis not present

## 2011-12-05 DIAGNOSIS — R143 Flatulence: Secondary | ICD-10-CM | POA: Diagnosis not present

## 2011-12-30 ENCOUNTER — Ambulatory Visit (INDEPENDENT_AMBULATORY_CARE_PROVIDER_SITE_OTHER): Payer: Medicare Other

## 2011-12-30 DIAGNOSIS — J45909 Unspecified asthma, uncomplicated: Secondary | ICD-10-CM | POA: Diagnosis not present

## 2011-12-30 MED ORDER — OMALIZUMAB 150 MG ~~LOC~~ SOLR
150.0000 mg | Freq: Once | SUBCUTANEOUS | Status: AC
  Administered 2011-12-30: 150 mg via SUBCUTANEOUS

## 2012-01-31 ENCOUNTER — Ambulatory Visit (INDEPENDENT_AMBULATORY_CARE_PROVIDER_SITE_OTHER): Payer: Medicare Other

## 2012-01-31 DIAGNOSIS — J45909 Unspecified asthma, uncomplicated: Secondary | ICD-10-CM

## 2012-02-04 DIAGNOSIS — J45909 Unspecified asthma, uncomplicated: Secondary | ICD-10-CM | POA: Diagnosis not present

## 2012-02-04 MED ORDER — OMALIZUMAB 150 MG ~~LOC~~ SOLR
150.0000 mg | Freq: Once | SUBCUTANEOUS | Status: AC
  Administered 2012-02-04: 150 mg via SUBCUTANEOUS

## 2012-03-05 ENCOUNTER — Ambulatory Visit (INDEPENDENT_AMBULATORY_CARE_PROVIDER_SITE_OTHER): Payer: Medicare Other

## 2012-03-05 DIAGNOSIS — J309 Allergic rhinitis, unspecified: Secondary | ICD-10-CM

## 2012-03-05 DIAGNOSIS — J45909 Unspecified asthma, uncomplicated: Secondary | ICD-10-CM

## 2012-03-10 MED ORDER — OMALIZUMAB 150 MG ~~LOC~~ SOLR
150.0000 mg | Freq: Once | SUBCUTANEOUS | Status: AC
  Administered 2012-03-10: 150 mg via SUBCUTANEOUS

## 2012-04-03 ENCOUNTER — Ambulatory Visit (INDEPENDENT_AMBULATORY_CARE_PROVIDER_SITE_OTHER): Payer: Medicare Other

## 2012-04-03 DIAGNOSIS — J45909 Unspecified asthma, uncomplicated: Secondary | ICD-10-CM

## 2012-04-03 MED ORDER — OMALIZUMAB 150 MG ~~LOC~~ SOLR
150.0000 mg | Freq: Once | SUBCUTANEOUS | Status: AC
  Administered 2012-04-03: 150 mg via SUBCUTANEOUS

## 2012-05-05 ENCOUNTER — Ambulatory Visit (INDEPENDENT_AMBULATORY_CARE_PROVIDER_SITE_OTHER): Payer: Medicare Other

## 2012-05-05 ENCOUNTER — Ambulatory Visit (INDEPENDENT_AMBULATORY_CARE_PROVIDER_SITE_OTHER): Payer: Medicare Other | Admitting: Critical Care Medicine

## 2012-05-05 ENCOUNTER — Encounter: Payer: Self-pay | Admitting: Critical Care Medicine

## 2012-05-05 VITALS — BP 132/76 | HR 78 | Temp 98.1°F | Ht 60.0 in | Wt 147.2 lb

## 2012-05-05 DIAGNOSIS — J45909 Unspecified asthma, uncomplicated: Secondary | ICD-10-CM

## 2012-05-05 NOTE — Patient Instructions (Addendum)
No change in medications. Return in        6 months        

## 2012-05-05 NOTE — Progress Notes (Signed)
Subjective:    Patient ID: Kathy Whitaker, female    DOB: 1933-05-30, 79 y.o.   MRN: [id number]  HPI  79 y.o.  WF with severe atopic asthma   05/05/2012 No new issues. Pt getting xolair and helps.    PUL ASTHMA HISTORY 05/05/2012 10/16/2011 12/07/2010  Symptoms 0-2 days/week 0-2 days/week 0-2 days/week  Nighttime awakenings 0-2/month 0-2/month 0-2/month  Interference with activity No limitations Minor limitations Minor limitations  SABA use > 2 days/wk--not > 1 x/day 0-2 days/wk > 2 days/wk--not > 1 x/day  Exacerbations requiring oral steroids 0-1 / year 0-1 / year 0-1 / year     Past Medical History  Diagnosis Date  . Asthma   . GERD (gastroesophageal reflux disease)   . Hyperlipemia   . Hypothyroidism      No family history on file.   History   Social History  . Marital Status: Married    Spouse Name: N/A    Number of Children: N/A  . Years of Education: N/A   Occupational History  . Not on file.   Social History Main Topics  . Smoking status: Never Smoker   . Smokeless tobacco: Never Used  . Alcohol Use: Yes     Comment: Wine daily  . Drug Use: Not on file  . Sexually Active: Not on file   Other Topics Concern  . Not on file   Social History Narrative  . No narrative on file     Allergies  Allergen Reactions  . Codeine   . Propoxyphene-Acetaminophen   . Shellfish-Derived Products      Outpatient Prescriptions Prior to Visit  Medication Sig Dispense Refill  . albuterol-ipratropium (COMBIVENT) 18-103 MCG/ACT inhaler Inhale 2 puffs into the lungs every 6 (six) hours as needed for wheezing.  3 Inhaler  0  . budesonide (PULMICORT) 0.25 MG/2ML nebulizer solution Take 0.25 mg by nebulization. Once daily and will take again if needed      . calcium-vitamin D (CALCIUM 500/D) 500-200 MG-UNIT per tablet Take 1 tablet by mouth daily.        . cholecalciferol (VITAMIN D) 1000 UNITS tablet Take 1,000 Units by mouth daily.        [name] EPINEPHrine (EPIPEN JR) 0.15 MG/0.3ML  injection Inject 0.15 mg into the muscle as needed.        . formoterol (PERFOROMIST) 20 MCG/2ML nebulizer solution Take 20 mcg by nebulization. Once daily and again if needed      . levothyroxine (SYNTHROID, LEVOTHROID) 75 MCG tablet Take 75 mcg by mouth daily.        [name] loratadine (CLARITIN) 10 MG tablet Take 10 mg by mouth daily.        [name] omalizumab (XOLAIR) 150 MG injection Inject 150 mg into the skin every 28 (twenty-eight) days.        [name] sertraline (ZOLOFT) 50 MG tablet Take 50 mg by mouth daily.        . simvastatin (ZOCOR) 40 MG tablet Take 40 mg by mouth at bedtime.         No facility-administered medications prior to visit.      Review of Systems  Constitutional:   No  weight loss, night sweats,  Fevers, chills, fatigue, lassitude. HEENT:   No headaches,  Difficulty swallowing,  Tooth/dental problems,  Sore throat,                No sneezing, itching, ear ache, nasal congestion, post nasal drip,   CV:  No  chest pain,  Orthopnea, PND, swelling in lower extremities, anasarca, dizziness, palpitations  GI  No heartburn, indigestion, abdominal pain, nausea, vomiting, diarrhea, change in bowel habits, loss of appetite  Resp: No shortness of breath with exertion or at rest.  No excess mucus, no productive cough,  No non-productive cough,  No coughing up of blood.  No change in color of mucus.  No wheezing.  No chest wall deformity  Skin: no rash or lesions.  GU: no dysuria, change in color of urine, no urgency or frequency.  No flank pain.  MS:  No joint pain or swelling.  No decreased range of motion.  No back pain.  Psych:  No change in mood or affect. No depression or anxiety.  No memory loss.     Objective:   Physical Exam  Filed Vitals:   05/05/12 1559  BP: 132/76  Pulse: 78  Temp: 98.1 F (36.7 C)  TempSrc: Oral  Height: 5' (1.524 m)  Weight: 66.769 kg (147 lb 3.2 oz)  SpO2: 96%    Gen: Pleasant, well-nourished, in no distress,  normal affect  ENT: No  lesions,  mouth clear,  oropharynx clear, no postnasal drip  Neck: No JVD, no TMG, no carotid bruits  Lungs: No use of accessory muscles, no dullness to percussion, distant BS  Cardiovascular: RRR, heart sounds normal, no murmur or gallops, no peripheral edema  Abdomen: soft and NT, no HSM,  BS normal  Musculoskeletal: No deformities, no cyanosis or clubbing  Neuro: alert, non focal  Skin: Warm, no lesions or rashes        Assessment & Plan:   Severe persistent asthma Severe persistent asthma stable at this time Plan Maintain current inhaled medications as prescribed Maintain Xolair    Updated Medication List Outpatient Encounter Prescriptions as of 05/05/2012  Medication Sig Dispense Refill  . albuterol-ipratropium (COMBIVENT) 18-103 MCG/ACT inhaler Inhale 2 puffs into the lungs every 6 (six) hours as needed for wheezing.  3 Inhaler  0  . budesonide (PULMICORT) 0.25 MG/2ML nebulizer solution Take 0.25 mg by nebulization. Once daily and will take again if needed      . calcium-vitamin D (CALCIUM 500/D) 500-200 MG-UNIT per tablet Take 1 tablet by mouth daily.        . cholecalciferol (VITAMIN D) 1000 UNITS tablet Take 1,000 Units by mouth daily.        [name] EPINEPHrine (EPIPEN JR) 0.15 MG/0.3ML injection Inject 0.15 mg into the muscle as needed.        . formoterol (PERFOROMIST) 20 MCG/2ML nebulizer solution Take 20 mcg by nebulization. Once daily and again if needed      . levothyroxine (SYNTHROID, LEVOTHROID) 75 MCG tablet Take 75 mcg by mouth daily.        [name] loratadine (CLARITIN) 10 MG tablet Take 10 mg by mouth daily.        [name] omalizumab (XOLAIR) 150 MG injection Inject 150 mg into the skin every 28 (twenty-eight) days.        [name] sertraline (ZOLOFT) 50 MG tablet Take 50 mg by mouth daily.        . simvastatin (ZOCOR) 40 MG tablet Take 40 mg by mouth at bedtime.         No facility-administered encounter medications on file as of 05/05/2012.

## 2012-05-06 MED ORDER — OMALIZUMAB 150 MG ~~LOC~~ SOLR
150.0000 mg | Freq: Once | SUBCUTANEOUS | Status: AC
  Administered 2012-05-06: 150 mg via SUBCUTANEOUS

## 2012-05-06 MED ORDER — OMALIZUMAB 150 MG ~~LOC~~ SOLR
300.0000 mg | Freq: Once | SUBCUTANEOUS | Status: AC
  Administered 2012-05-06: 300 mg via SUBCUTANEOUS

## 2012-05-06 NOTE — Progress Notes (Signed)
Error in dosing documenting .deleted charges and re entered.

## 2012-05-06 NOTE — Assessment & Plan Note (Signed)
Severe persistent asthma stable at this time Plan Maintain current inhaled medications as prescribed Maintain Xolair

## 2012-05-12 DIAGNOSIS — L821 Other seborrheic keratosis: Secondary | ICD-10-CM | POA: Diagnosis not present

## 2012-05-12 DIAGNOSIS — L719 Rosacea, unspecified: Secondary | ICD-10-CM | POA: Diagnosis not present

## 2012-06-04 ENCOUNTER — Ambulatory Visit (INDEPENDENT_AMBULATORY_CARE_PROVIDER_SITE_OTHER): Payer: Medicare Other

## 2012-06-04 DIAGNOSIS — J45909 Unspecified asthma, uncomplicated: Secondary | ICD-10-CM

## 2012-06-09 MED ORDER — OMALIZUMAB 150 MG ~~LOC~~ SOLR
150.0000 mg | Freq: Once | SUBCUTANEOUS | Status: AC
  Administered 2012-06-09: 150 mg via SUBCUTANEOUS

## 2012-07-02 ENCOUNTER — Ambulatory Visit (INDEPENDENT_AMBULATORY_CARE_PROVIDER_SITE_OTHER): Payer: Medicare Other

## 2012-07-02 DIAGNOSIS — J45909 Unspecified asthma, uncomplicated: Secondary | ICD-10-CM

## 2012-07-06 MED ORDER — OMALIZUMAB 150 MG ~~LOC~~ SOLR
150.0000 mg | Freq: Once | SUBCUTANEOUS | Status: AC
  Administered 2012-07-06: 150 mg via SUBCUTANEOUS

## 2012-07-30 ENCOUNTER — Ambulatory Visit: Payer: Medicare Other

## 2012-08-03 ENCOUNTER — Ambulatory Visit: Payer: Medicare Other

## 2012-08-04 ENCOUNTER — Ambulatory Visit (INDEPENDENT_AMBULATORY_CARE_PROVIDER_SITE_OTHER): Payer: Medicare Other

## 2012-08-04 DIAGNOSIS — J45909 Unspecified asthma, uncomplicated: Secondary | ICD-10-CM

## 2012-08-05 MED ORDER — OMALIZUMAB 150 MG ~~LOC~~ SOLR
150.0000 mg | Freq: Once | SUBCUTANEOUS | Status: AC
  Administered 2012-08-05: 150 mg via SUBCUTANEOUS

## 2012-09-07 ENCOUNTER — Ambulatory Visit (INDEPENDENT_AMBULATORY_CARE_PROVIDER_SITE_OTHER): Payer: Medicare Other

## 2012-09-07 DIAGNOSIS — J45909 Unspecified asthma, uncomplicated: Secondary | ICD-10-CM

## 2012-09-10 MED ORDER — OMALIZUMAB 150 MG ~~LOC~~ SOLR
150.0000 mg | Freq: Once | SUBCUTANEOUS | Status: AC
  Administered 2012-09-10: 150 mg via SUBCUTANEOUS

## 2012-10-09 ENCOUNTER — Ambulatory Visit (INDEPENDENT_AMBULATORY_CARE_PROVIDER_SITE_OTHER): Payer: Medicare Other

## 2012-10-09 DIAGNOSIS — J45909 Unspecified asthma, uncomplicated: Secondary | ICD-10-CM

## 2012-10-12 MED ORDER — OMALIZUMAB 150 MG ~~LOC~~ SOLR
150.0000 mg | Freq: Once | SUBCUTANEOUS | Status: AC
  Administered 2012-10-12: 150 mg via SUBCUTANEOUS

## 2012-10-13 DIAGNOSIS — H2510 Age-related nuclear cataract, unspecified eye: Secondary | ICD-10-CM | POA: Diagnosis not present

## 2012-11-09 ENCOUNTER — Ambulatory Visit (INDEPENDENT_AMBULATORY_CARE_PROVIDER_SITE_OTHER): Payer: Medicare Other

## 2012-11-09 DIAGNOSIS — J45909 Unspecified asthma, uncomplicated: Secondary | ICD-10-CM | POA: Diagnosis not present

## 2012-11-11 DIAGNOSIS — R7309 Other abnormal glucose: Secondary | ICD-10-CM | POA: Diagnosis not present

## 2012-11-11 DIAGNOSIS — Z79899 Other long term (current) drug therapy: Secondary | ICD-10-CM | POA: Diagnosis not present

## 2012-11-11 DIAGNOSIS — E559 Vitamin D deficiency, unspecified: Secondary | ICD-10-CM | POA: Diagnosis not present

## 2012-11-11 DIAGNOSIS — Z1212 Encounter for screening for malignant neoplasm of rectum: Secondary | ICD-10-CM | POA: Diagnosis not present

## 2012-11-11 DIAGNOSIS — I10 Essential (primary) hypertension: Secondary | ICD-10-CM | POA: Diagnosis not present

## 2012-11-11 DIAGNOSIS — E782 Mixed hyperlipidemia: Secondary | ICD-10-CM | POA: Diagnosis not present

## 2012-11-11 MED ORDER — OMALIZUMAB 150 MG ~~LOC~~ SOLR
150.0000 mg | Freq: Once | SUBCUTANEOUS | Status: AC
  Administered 2012-11-11: 150 mg via SUBCUTANEOUS

## 2012-11-17 ENCOUNTER — Ambulatory Visit: Payer: Medicare Other | Admitting: Critical Care Medicine

## 2012-11-18 ENCOUNTER — Ambulatory Visit: Payer: Medicare Other | Admitting: Critical Care Medicine

## 2012-11-19 DIAGNOSIS — Z23 Encounter for immunization: Secondary | ICD-10-CM | POA: Diagnosis not present

## 2012-11-20 ENCOUNTER — Encounter: Payer: Self-pay | Admitting: Critical Care Medicine

## 2012-11-20 ENCOUNTER — Ambulatory Visit (INDEPENDENT_AMBULATORY_CARE_PROVIDER_SITE_OTHER): Payer: Medicare Other | Admitting: Critical Care Medicine

## 2012-11-20 VITALS — BP 100/54 | HR 78 | Temp 98.0°F | Ht 60.0 in | Wt 146.6 lb

## 2012-11-20 DIAGNOSIS — J45909 Unspecified asthma, uncomplicated: Secondary | ICD-10-CM | POA: Diagnosis not present

## 2012-11-20 DIAGNOSIS — J4550 Severe persistent asthma, uncomplicated: Secondary | ICD-10-CM

## 2012-11-20 MED ORDER — BUDESONIDE 0.25 MG/2ML IN SUSP: 0.2500 mg | Freq: Every day | RESPIRATORY_TRACT | Status: DC

## 2012-11-20 MED ORDER — FORMOTEROL FUMARATE 20 MCG/2ML IN NEBU: 20.0000 ug | INHALATION_SOLUTION | Freq: Two times a day (BID) | RESPIRATORY_TRACT | Status: DC

## 2012-11-20 NOTE — Progress Notes (Signed)
Subjective:    Patient ID: Kathy Whitaker, female    DOB: Feb 04, 1934, 79 y.o.   MRN: [id number]  HPI  79 y.o.  WF with severe atopic asthma    PUL ASTHMA HISTORY 11/20/2012 05/05/2012 10/16/2011 12/07/2010  Symptoms 0-2 days/week 0-2 days/week 0-2 days/week 0-2 days/week  Nighttime awakenings 0-2/month 0-2/month 0-2/month 0-2/month  Interference with activity No limitations No limitations Minor limitations Minor limitations  SABA use 0-2 days/wk > 2 days/wk--not > 1 x/day 0-2 days/wk > 2 days/wk--not > 1 x/day  Exacerbations requiring oral steroids 0-1 / year 0-1 / year 0-1 / year 0-1 / year   11/20/2012 Chief Complaint  Patient presents with  . 6 month follow up    Wasn't breathing well last night - Used combicort resuce inhaler with relief.  Also, has dry cough.  No wheezing, chest tightness, or chest pain.   xolair has helped.  Only one episode of qhs symptoms. Hard time getting neb meds from [facility]  This pt is on neb meds perforomist and budesonide and is benefitting from therapy.  Past Medical History  Diagnosis Date  . Asthma   . GERD (gastroesophageal reflux disease)   . Hyperlipemia   . Hypothyroidism      No family history on file.   History   Social History  . Marital Status: Married    Spouse Name: N/A    Number of Children: N/A  . Years of Education: N/A   Occupational History  . Not on file.   Social History Main Topics  . Smoking status: Never Smoker   . Smokeless tobacco: Never Used  . Alcohol Use: Yes     Comment: Wine daily  . Drug Use: Not on file  . Sexual Activity: Not on file   Other Topics Concern  . Not on file   Social History Narrative  . No narrative on file     Allergies  Allergen Reactions  . Codeine   . Propoxyphene-Acetaminophen   . Shellfish-Derived Products      Outpatient Prescriptions Prior to Visit  Medication Sig Dispense Refill  . albuterol-ipratropium (COMBIVENT) 18-103 MCG/ACT inhaler Inhale 2 puffs into the lungs  every 6 (six) hours as needed for wheezing.  3 Inhaler  0  . calcium-vitamin D (CALCIUM 500/D) 500-200 MG-UNIT per tablet Take 1 tablet by mouth daily.        . cholecalciferol (VITAMIN D) 1000 UNITS tablet Take 4,000 Units by mouth daily.       [name] EPINEPHrine (EPIPEN JR) 0.15 MG/0.3ML injection Inject 0.15 mg into the muscle as needed.        [name] levothyroxine (SYNTHROID, LEVOTHROID) 75 MCG tablet Take 75 mcg by mouth daily.        [name] loratadine (CLARITIN) 10 MG tablet Take 10 mg by mouth daily.        [name] omalizumab (XOLAIR) 150 MG injection Inject 150 mg into the skin every 28 (twenty-eight) days.        [name] sertraline (ZOLOFT) 50 MG tablet Take 50 mg by mouth daily.        . simvastatin (ZOCOR) 40 MG tablet Take 40 mg by mouth at bedtime.        . budesonide (PULMICORT) 0.25 MG/2ML nebulizer solution Take 0.25 mg by nebulization. Once daily and will take again if needed      . formoterol (PERFOROMIST) 20 MCG/2ML nebulizer solution Take 20 mcg by nebulization. Once daily and again if needed  No facility-administered medications prior to visit.      Review of Systems  Constitutional:   No  weight loss, night sweats,  Fevers, chills, fatigue, lassitude. HEENT:   No headaches,  Difficulty swallowing,  Tooth/dental problems,  Sore throat,                No sneezing, itching, ear ache, nasal congestion, post nasal drip,   CV:  No chest pain,  Orthopnea, PND, swelling in lower extremities, anasarca, dizziness, palpitations  GI  No heartburn, indigestion, abdominal pain, nausea, vomiting, diarrhea, change in bowel habits, loss of appetite  Resp: No shortness of breath with exertion or at rest.  No excess mucus, no productive cough,  No non-productive cough,  No coughing up of blood.  No change in color of mucus.  No wheezing.  No chest wall deformity  Skin: no rash or lesions.  GU: no dysuria, change in color of urine, no urgency or frequency.  No flank pain.  MS:  No joint pain or  swelling.  No decreased range of motion.  No back pain.  Psych:  No change in mood or affect. No depression or anxiety.  No memory loss.     Objective:   Physical Exam  Filed Vitals:   11/20/12 1558  BP: 100/54  Pulse: 78  Temp: 98 F (36.7 C)  TempSrc: Oral  Height: 5' (1.524 m)  Weight: 146 lb 9.6 oz (66.497 kg)  SpO2: 95%    Gen: Pleasant, well-nourished, in no distress,  normal affect  ENT: No lesions,  mouth clear,  oropharynx clear, no postnasal drip  Neck: No JVD, no TMG, no carotid bruits  Lungs: No use of accessory muscles, no dullness to percussion, distant BS  Cardiovascular: RRR, heart sounds normal, no murmur or gallops, no peripheral edema  Abdomen: soft and NT, no HSM,  BS normal  Musculoskeletal: No deformities, no cyanosis or clubbing  Neuro: alert, non focal  Skin: Warm, no lesions or rashes        Assessment & Plan:   Severe persistent asthma Severe persistent asthma Plan Cont neb meds     Updated Medication List Outpatient Encounter Prescriptions as of 11/20/2012  Medication Sig Dispense Refill  . albuterol-ipratropium (COMBIVENT) 18-103 MCG/ACT inhaler Inhale 2 puffs into the lungs every 6 (six) hours as needed for wheezing.  3 Inhaler  0  . budesonide (PULMICORT) 0.25 MG/2ML nebulizer solution Take 2 mLs (0.25 mg total) by nebulization daily. Once daily and will take again if needed  60 mL  11  . calcium-vitamin D (CALCIUM 500/D) 500-200 MG-UNIT per tablet Take 1 tablet by mouth daily.        . cholecalciferol (VITAMIN D) 1000 UNITS tablet Take 4,000 Units by mouth daily.       [name] EPINEPHrine (EPIPEN JR) 0.15 MG/0.3ML injection Inject 0.15 mg into the muscle as needed.        . formoterol (PERFOROMIST) 20 MCG/2ML nebulizer solution Take 2 mLs (20 mcg total) by nebulization 2 (two) times daily. Once daily and again if needed  60 mL  11  . levothyroxine (SYNTHROID, LEVOTHROID) 75 MCG tablet Take 75 mcg by mouth daily.        [name]  loratadine (CLARITIN) 10 MG tablet Take 10 mg by mouth daily.        [name] omalizumab (XOLAIR) 150 MG injection Inject 150 mg into the skin every 28 (twenty-eight) days.        [name] sertraline (ZOLOFT) 50  MG tablet Take 50 mg by mouth daily.        . simvastatin (ZOCOR) 40 MG tablet Take 40 mg by mouth at bedtime.        . [DISCONTINUED] budesonide (PULMICORT) 0.25 MG/2ML nebulizer solution Take 0.25 mg by nebulization. Once daily and will take again if needed      . [DISCONTINUED] formoterol (PERFOROMIST) 20 MCG/2ML nebulizer solution Take 20 mcg by nebulization. Once daily and again if needed       No facility-administered encounter medications on file as of 11/20/2012.

## 2012-11-20 NOTE — Patient Instructions (Signed)
Stay on perforomist and budesonide Return 4 months We will transfer your nebulizer meds to [facility]

## 2012-11-21 NOTE — Assessment & Plan Note (Signed)
Severe persistent asthma Plan Cont neb meds

## 2012-12-03 DIAGNOSIS — Z01419 Encounter for gynecological examination (general) (routine) without abnormal findings: Secondary | ICD-10-CM | POA: Diagnosis not present

## 2012-12-08 ENCOUNTER — Telehealth: Payer: Self-pay | Admitting: Critical Care Medicine

## 2012-12-08 NOTE — Telephone Encounter (Signed)
Had not heard from [facility].  Called spoke with patient, who reported that she had just heard from [facility] who reported that her medication is to be over-nighted to her by [facility].  Patient stated that she was not informed what the issue was.    Advised patient to please call us sooner next time if there is an issue with her medications (pt verbalized understanding) and also informed pt will keep this documentation open to check with [facility] tomorrow to try to find out why her medication was delayed.  Pt aware our office will call her back once we receive update status from [facility].

## 2012-12-08 NOTE — Telephone Encounter (Signed)
[facility] PT line busy x 4 WCB

## 2012-12-08 NOTE — Telephone Encounter (Signed)
Patient calling back about order to [facility].

## 2012-12-08 NOTE — Telephone Encounter (Signed)
Pt is saying that her meds from her 10/17 appt w/ PW wasn't called in, either.  Kathy Whitaker

## 2012-12-08 NOTE — Telephone Encounter (Signed)
Pt returned call.  Spoke with patient who reported that she has yet to hear from DME regarding her nebs.  Pt stated that she was given samples of Brovana at [facility] and is nearly out of this medication and has not had any Budesonide since ov and is "starting to feel the effects of it."  Per pt's chart, at the 10.17.14 ov w/ [facility] the order and rx's were sent to [facility] for DME change for nebs.  Advised pt of this and that we would look into this issue and get it straightened out ASAP.  Advised pt will call [facility] and call her back once we have an update for her.  Pt okay with this and verbalized her understanding.  Called [facility], spoke with Iris who reported that she does recall this order and had spoken with Selena Batten regarding this.  Unfortunately Selena Batten has stepped out of the office but Iris will have her call the office.

## 2012-12-09 NOTE — Telephone Encounter (Signed)
Called [facility], spoke with Iris who reported that it appears that the order and rx's from last ov were not received by [facility]' "corporate dept" thereby causing the lax in the patient getting her medication.  Per Iris, this is a very rare occurrence and they ([facility]) have corrected this.  Called patient, discussed the above with her and apologized for the inconvenience.  Again advised pt to contact the office ASAP if there are ever any future issues with her medications.  Pt was concerned that this is will an issue with [facility] going forward.  Advised pt that if she is unhappy with their service to contact the office and we can try to find her a DME company that will better meet her needs.  Pt okay with this and verbalized her understanding.  Nothing further needed; will sign off.

## 2012-12-10 ENCOUNTER — Ambulatory Visit: Payer: Medicare Other

## 2012-12-11 ENCOUNTER — Ambulatory Visit (INDEPENDENT_AMBULATORY_CARE_PROVIDER_SITE_OTHER): Payer: Medicare Other

## 2012-12-11 DIAGNOSIS — J45909 Unspecified asthma, uncomplicated: Secondary | ICD-10-CM | POA: Diagnosis not present

## 2012-12-11 MED ORDER — OMALIZUMAB 150 MG ~~LOC~~ SOLR
150.0000 mg | Freq: Once | SUBCUTANEOUS | Status: AC
  Administered 2012-12-11: 150 mg via SUBCUTANEOUS

## 2012-12-15 ENCOUNTER — Other Ambulatory Visit: Payer: Self-pay | Admitting: Internal Medicine

## 2012-12-15 ENCOUNTER — Ambulatory Visit (HOSPITAL_COMMUNITY)
Admission: RE | Admit: 2012-12-15 | Discharge: 2012-12-15 | Disposition: A | Discharge status: Home / Self Care | Payer: Medicare Other | Source: Ambulatory Visit | Attending: Internal Medicine | Admitting: Internal Medicine

## 2012-12-15 DIAGNOSIS — J984 Other disorders of lung: Secondary | ICD-10-CM | POA: Diagnosis not present

## 2012-12-15 DIAGNOSIS — R0602 Shortness of breath: Secondary | ICD-10-CM | POA: Insufficient documentation

## 2012-12-15 DIAGNOSIS — J45909 Unspecified asthma, uncomplicated: Secondary | ICD-10-CM

## 2012-12-15 DIAGNOSIS — J449 Chronic obstructive pulmonary disease, unspecified: Secondary | ICD-10-CM | POA: Insufficient documentation

## 2012-12-15 DIAGNOSIS — I10 Essential (primary) hypertension: Secondary | ICD-10-CM

## 2012-12-15 DIAGNOSIS — J4489 Other specified chronic obstructive pulmonary disease: Secondary | ICD-10-CM | POA: Insufficient documentation

## 2012-12-18 ENCOUNTER — Telehealth: Payer: Self-pay | Admitting: Critical Care Medicine

## 2012-12-18 DIAGNOSIS — J45909 Unspecified asthma, uncomplicated: Secondary | ICD-10-CM

## 2012-12-18 NOTE — Telephone Encounter (Signed)
lmtcb x1 w/ spouse 

## 2012-12-21 NOTE — Telephone Encounter (Signed)
Order placed. DME is [facility]. Carron Curie, CMA

## 2012-12-30 DIAGNOSIS — N959 Unspecified menopausal and perimenopausal disorder: Secondary | ICD-10-CM | POA: Diagnosis not present

## 2012-12-30 DIAGNOSIS — Z1231 Encounter for screening mammogram for malignant neoplasm of breast: Secondary | ICD-10-CM | POA: Diagnosis not present

## 2012-12-30 DIAGNOSIS — M810 Age-related osteoporosis without current pathological fracture: Secondary | ICD-10-CM | POA: Diagnosis not present

## 2013-01-05 DIAGNOSIS — D126 Benign neoplasm of colon, unspecified: Secondary | ICD-10-CM | POA: Diagnosis not present

## 2013-01-05 DIAGNOSIS — Z86010 Personal history of colonic polyps: Secondary | ICD-10-CM | POA: Diagnosis not present

## 2013-01-05 DIAGNOSIS — Z09 Encounter for follow-up examination after completed treatment for conditions other than malignant neoplasm: Secondary | ICD-10-CM | POA: Diagnosis not present

## 2013-01-08 ENCOUNTER — Encounter: Payer: Self-pay | Admitting: Critical Care Medicine

## 2013-01-11 ENCOUNTER — Ambulatory Visit (INDEPENDENT_AMBULATORY_CARE_PROVIDER_SITE_OTHER): Payer: Medicare Other

## 2013-01-11 DIAGNOSIS — J45909 Unspecified asthma, uncomplicated: Secondary | ICD-10-CM

## 2013-01-12 MED ORDER — OMALIZUMAB 150 MG ~~LOC~~ SOLR
150.0000 mg | Freq: Once | SUBCUTANEOUS | Status: AC
  Administered 2013-01-12: 150 mg via SUBCUTANEOUS

## 2013-02-11 ENCOUNTER — Ambulatory Visit: Payer: Medicare Other

## 2013-02-12 ENCOUNTER — Ambulatory Visit (INDEPENDENT_AMBULATORY_CARE_PROVIDER_SITE_OTHER): Payer: Medicare Other

## 2013-02-12 DIAGNOSIS — J45909 Unspecified asthma, uncomplicated: Secondary | ICD-10-CM | POA: Diagnosis not present

## 2013-02-15 ENCOUNTER — Encounter: Payer: Self-pay | Admitting: Physician Assistant

## 2013-02-15 ENCOUNTER — Ambulatory Visit (INDEPENDENT_AMBULATORY_CARE_PROVIDER_SITE_OTHER): Payer: Medicare Other | Admitting: Physician Assistant

## 2013-02-15 VITALS — BP 110/62 | HR 64 | Temp 98.5°F | Resp 16 | Ht 60.0 in | Wt 140.0 lb

## 2013-02-15 DIAGNOSIS — E559 Vitamin D deficiency, unspecified: Secondary | ICD-10-CM | POA: Diagnosis not present

## 2013-02-15 DIAGNOSIS — I10 Essential (primary) hypertension: Secondary | ICD-10-CM

## 2013-02-15 DIAGNOSIS — R7309 Other abnormal glucose: Secondary | ICD-10-CM | POA: Diagnosis not present

## 2013-02-15 DIAGNOSIS — E039 Hypothyroidism, unspecified: Secondary | ICD-10-CM

## 2013-02-15 DIAGNOSIS — R7303 Prediabetes: Secondary | ICD-10-CM

## 2013-02-15 DIAGNOSIS — R109 Unspecified abdominal pain: Secondary | ICD-10-CM | POA: Diagnosis not present

## 2013-02-15 DIAGNOSIS — E785 Hyperlipidemia, unspecified: Secondary | ICD-10-CM

## 2013-02-15 DIAGNOSIS — Z79899 Other long term (current) drug therapy: Secondary | ICD-10-CM

## 2013-02-15 DIAGNOSIS — E782 Mixed hyperlipidemia: Secondary | ICD-10-CM | POA: Insufficient documentation

## 2013-02-15 LAB — CBC WITH DIFFERENTIAL/PLATELET
Basophils Absolute: 0 10*3/uL (ref 0.0–0.1)
Basophils Relative: 1 % (ref 0–1)
Eosinophils Absolute: 0.1 10*3/uL (ref 0.0–0.7)
Eosinophils Relative: 1 % (ref 0–5)
HEMATOCRIT: 42.5 % (ref 36.0–46.0)
HEMOGLOBIN: 13.9 g/dL (ref 12.0–15.0)
Lymphocytes Relative: 25 % (ref 12–46)
Lymphs Abs: 1.4 10*3/uL (ref 0.7–4.0)
MCH: 29.1 pg (ref 26.0–34.0)
MCHC: 32.7 g/dL (ref 30.0–36.0)
MCV: 88.9 fL (ref 78.0–100.0)
MONOS PCT: 9 % (ref 3–12)
Monocytes Absolute: 0.5 10*3/uL (ref 0.1–1.0)
NEUTROS ABS: 3.6 10*3/uL (ref 1.7–7.7)
NEUTROS PCT: 64 % (ref 43–77)
Platelets: 264 10*3/uL (ref 150–400)
RBC: 4.78 MIL/uL (ref 3.87–5.11)
RDW: 14.4 % (ref 11.5–15.5)
WBC: 5.6 10*3/uL (ref 4.0–10.5)

## 2013-02-15 LAB — HEMOGLOBIN A1C
HEMOGLOBIN A1C: 5.7 % — AB (ref <5.7)
MEAN PLASMA GLUCOSE: 117 mg/dL — AB (ref <117)

## 2013-02-15 LAB — HEPATIC FUNCTION PANEL
ALT: 11 U/L (ref 0–35)
AST: 20 U/L (ref 0–37)
Albumin: 4.2 g/dL (ref 3.5–5.2)
Alkaline Phosphatase: 72 U/L (ref 39–117)
BILIRUBIN DIRECT: 0.1 mg/dL (ref 0.0–0.3)
BILIRUBIN INDIRECT: 0.4 mg/dL (ref 0.0–0.9)
Total Bilirubin: 0.5 mg/dL (ref 0.3–1.2)
Total Protein: 6.8 g/dL (ref 6.0–8.3)

## 2013-02-15 LAB — BASIC METABOLIC PANEL WITH GFR
BUN: 19 mg/dL (ref 6–23)
CO2: 33 meq/L — AB (ref 19–32)
Calcium: 9.8 mg/dL (ref 8.4–10.5)
Chloride: 101 mEq/L (ref 96–112)
Creat: 0.76 mg/dL (ref 0.50–1.10)
GFR, Est African American: 88 mL/min
GFR, Est Non African American: 76 mL/min
GLUCOSE: 90 mg/dL (ref 70–99)
POTASSIUM: 4.7 meq/L (ref 3.5–5.3)
SODIUM: 139 meq/L (ref 135–145)

## 2013-02-15 LAB — TSH: TSH: 3.042 u[IU]/mL (ref 0.350–4.500)

## 2013-02-15 LAB — MAGNESIUM: Magnesium: 1.9 mg/dL (ref 1.5–2.5)

## 2013-02-15 LAB — LIPID PANEL
Cholesterol: 175 mg/dL (ref 0–200)
HDL: 62 mg/dL (ref >39)
LDL Cholesterol: 89 mg/dL (ref 0–99)
Total CHOL/HDL Ratio: 2.8 Ratio
Triglycerides: 121 mg/dL (ref <150)
VLDL: 24 mg/dL (ref 0–40)

## 2013-02-15 MED ORDER — ZOSTER VACCINE LIVE 19400 UNT/0.65ML ~~LOC~~ SOLR: 0.6500 mL | Freq: Once | SUBCUTANEOUS | Status: DC

## 2013-02-15 MED ORDER — OMALIZUMAB 150 MG ~~LOC~~ SOLR
150.0000 mg | Freq: Once | SUBCUTANEOUS | Status: AC
  Administered 2013-02-15: 150 mg via SUBCUTANEOUS

## 2013-02-15 NOTE — Patient Instructions (Signed)
Diarrhea Diarrhea is watery poop (stool). It can make you feel weak, tired, thirsty, or give you a dry mouth (signs of dehydration). Watery poop is a sign of another problem, most often an infection. It often lasts 2 3 days. It can last longer if it is a sign of something serious. Take care of yourself as told by your doctor. HOME CARE   Drink 1 cup (8 ounces) of fluid each time you have watery poop.  Do not drink the following fluids:  Those that contain simple sugars (fructose, glucose, galactose, lactose, sucrose, maltose).  Sports drinks.  Fruit juices.  Whole milk products.  Sodas.  Drinks with caffeine (coffee, tea, soda) or alcohol.  Oral rehydration solution may be used if the doctor says it is okay. You may make your own solution. Follow this recipe:    teaspoon table salt.   teaspoon baking soda.   teaspoon salt substitute containing potassium chloride.  1 tablespoons sugar.  1 liter (34 ounces) of water.  Avoid the following foods:  High fiber foods, such as raw fruits and vegetables.  Nuts, seeds, and whole grain breads and cereals.   Those that are sweetened with sugar alcohols (xylitol, sorbitol, mannitol).  Try eating the following foods:  Starchy foods, such as rice, toast, pasta, low-sugar cereal, oatmeal, baked potatoes, crackers, and bagels.  Bananas.  Applesauce.  Eat probiotic-rich foods, such as yogurt and milk products that are fermented.  Wash your hands well after each time you have watery poop.  Only take medicine as told by your doctor.  Take a warm bath to help lessen burning or pain from having watery poop. GET HELP RIGHT AWAY IF:   You cannot drink fluids without throwing up (vomiting).  You keep throwing up.  You have blood in your poop, or your poop looks black and tarry.  You do not pee (urinate) in 6 8 hours, or there is only a small amount of very dark pee.  You have belly (abdominal) pain that gets worse or stays in  the same spot (localizes).  You are weak, dizzy, confused, or lightheaded.  You have a very bad headache.  Your watery poop gets worse or does not get better.  You have a fever or lasting symptoms for more than 2 3 days.  You have a fever and your symptoms suddenly get worse. MAKE SURE YOU:   Understand these instructions.  Will watch your condition.  Will get help right away if you are not doing well or get worse. Document Released: 07/10/2007 Document Revised: 10/16/2011 Document Reviewed: 09/29/2011 [facility] Patient Information 2014 [place], [place].    Bad carbs also include fruit juice, alcohol, and sweet tea. These are empty calories that do not signal to your brain that you are full.   Please remember the good carbs are still carbs which convert into sugar. So please measure them out no more than 1/2-1 cup of rice, oatmeal, pasta, and beans.  Veggies are however free foods! Pile them on.   I like lean protein at every meal such as chicken, [place], pork chops, cottage cheese, etc. Just do not fry these meats and please center your meal around vegetable, the meats should be a side dish.   No all fruit is created equal. Please see the list below, the fruit at the bottom is higher in sugars than the fruit at the top

## 2013-02-15 NOTE — Progress Notes (Signed)
HPI Patient presents for 3 month follow up with hypertension, hyperlipidemia, prediabetes and vitamin D. Patient's blood pressure has been controlled at home, today their BP is BP: 110/62 mmHg  Patient denies chest pain, shortness of breath, dizziness.  Patient's cholesterol is diet controlled. In addition they are on zocor and denies myalgias. The cholesterol last visit was LDL 68.  The patient has been working on diet and exercise for prediabetes, her weight is down 4 lbs from last visit, and denies changes in vision, polys, and paresthesias. A1C 5.8.  Patient is on Vitamin D supplement.   Patient has had increased stress with her husband, taking care of his and holidays and has gone back up to zoloft 100 which is helping.  Patient states that she at a [employer] hamburger 1-2 months ago, had one day of vomiting and diarrhea, now since then in the morning she has been having soft clay colored stools 1-2 times every morning with slight nausea but she feels fine the rest of the day. Yogurt helps. Denies GERD, blood in stool, black stool.  Current Medications:  Current Outpatient Prescriptions on File Prior to Visit  Medication Sig Dispense Refill  . albuterol-ipratropium (COMBIVENT) 18-103 MCG/ACT inhaler Inhale 2 puffs into the lungs every 6 (six) hours as needed for wheezing.  3 Inhaler  0  . budesonide (PULMICORT) 0.25 MG/2ML nebulizer solution Take 2 mLs (0.25 mg total) by nebulization daily. Once daily and will take again if needed  60 mL  11  . calcium-vitamin D (CALCIUM 500/D) 500-200 MG-UNIT per tablet Take 1 tablet by mouth daily.        . cholecalciferol (VITAMIN D) 1000 UNITS tablet Take 4,000 Units by mouth daily.       [name] EPINEPHrine (EPIPEN JR) 0.15 MG/0.3ML injection Inject 0.15 mg into the muscle as needed.        . formoterol (PERFOROMIST) 20 MCG/2ML nebulizer solution Take 2 mLs (20 mcg total) by nebulization 2 (two) times daily. Once daily and again if needed  60 mL  11  .  levothyroxine (SYNTHROID, LEVOTHROID) 75 MCG tablet Take 75 mcg by mouth daily.        [name] loratadine (CLARITIN) 10 MG tablet Take 10 mg by mouth daily.        [name] omalizumab (XOLAIR) 150 MG injection Inject 150 mg into the skin every 28 (twenty-eight) days.        [name] sertraline (ZOLOFT) 50 MG tablet Take 50 mg by mouth daily.        . simvastatin (ZOCOR) 40 MG tablet Take 40 mg by mouth at bedtime.         No current facility-administered medications on file prior to visit.   Medical History:  Past Medical History  Diagnosis Date  . Asthma   . GERD (gastroesophageal reflux disease)   . Hyperlipemia   . Hypothyroidism   . Hypertension   . COPD (chronic obstructive pulmonary disease)   . Prediabetes   . Vitamin D deficiency    Allergies:  Allergies  Allergen Reactions  . Codeine   . Propoxyphene N-Acetaminophen   . Shellfish-Derived Products     ROS Constitutional: Denies fever, chills, headaches, insomnia, fatigue, night sweats Eyes: Denies redness, blurred vision, diplopia, discharge, itchy, watery eyes.  ENT: Denies congestion, post nasal drip, sore throat, earache, dental pain, Tinnitus, Vertigo, Sinus pain, snoring.  Cardio: Denies chest pain, palpitations, irregular heartbeat, dyspnea, diaphoresis, orthopnea, PND, claudication, edema Respiratory: denies cough, shortness of breath, wheezing.  Gastrointestinal: Denies dysphagia, heartburn, AB pain/ cramps, N/V, diarrhea, constipation, hematemesis, melena, hematochezia,  hemorrhoids Genitourinary: Denies dysuria, frequency, urgency, nocturia, hesitancy, discharge, hematuria, flank pain Musculoskeletal: Denies myalgia, stiffness, pain, swelling and strain/sprain. Skin: Denies pruritis, rash, changing in skin lesion Neuro: Denies Weakness, tremor, incoordination, spasms, pain Psychiatric: Denies confusion, memory loss, sensory loss Endocrine: Denies change in weight, skin, hair change, nocturia Diabetic Polys, Denies visual  blurring, hyper /hypo glycemic episodes, and paresthesia, Heme/Lymph: Denies Excessive bleeding, bruising, enlarged lymph nodes  Family history- Review and unchanged Social history- Review and unchanged Physical Exam: Filed Vitals:   02/15/13 1135  BP: 110/62  Pulse: 64  Temp: 98.5 F (36.9 C)  Resp: 16   Filed Weights   02/15/13 1135  Weight: 140 lb (63.504 kg)   General Appearance: Well nourished, in no apparent distress. Eyes: PERRLA, EOMs, conjunctiva no swelling or erythema Sinuses: No Frontal/maxillary tenderness ENT/Mouth: Ext aud canals clear, TMs without erythema, bulging. No erythema, swelling, or exudate on post pharynx.  Tonsils not swollen or erythematous. Hearing normal.  Neck: Supple, thyroid normal.  Respiratory: Respiratory effort normal, BS equal bilaterally without rales, rhonchi, wheezing or stridor.  Cardio: RRR with no MRGs. Brisk peripheral pulses without edema.  Abdomen: Soft, + BS.  Non tender, no guarding, rebound, hernias, masses. Lymphatics: Non tender without lymphadenopathy.  Musculoskeletal: Full ROM, 5/5 strength, normal gait.  Skin: Warm, dry without rashes, lesions, ecchymosis.  Neuro: Cranial nerves intact. Normal muscle tone, no cerebellar symptoms. Sensation intact.  Psych: Awake and oriented X 3, normal affect, Insight and Judgment appropriate.   Assessment and Plan:  Hypertension: Continue medication, monitor blood pressure at home.  Continue DASH diet. Cholesterol: Continue diet and exercise. Check cholesterol.  Pre-diabetes-Continue diet and exercise. Check A1C Vitamin D Def- check level and continue medications.  Depression- continue Zoloft, follow up next visit.  Ab pain/clay colored stools- bland diet, probiotic, increase water, and get AB [place]  Continue diet and meds as discussed. Further disposition pending results of labs. OVER 40 minutes of exam, counseling, chart review, referral performed  Vicie Mutters 11:42 AM

## 2013-03-19 ENCOUNTER — Ambulatory Visit: Payer: Medicare Other

## 2013-04-13 ENCOUNTER — Telehealth: Payer: Self-pay | Admitting: Critical Care Medicine

## 2013-04-13 ENCOUNTER — Ambulatory Visit (INDEPENDENT_AMBULATORY_CARE_PROVIDER_SITE_OTHER): Payer: Medicare Other

## 2013-04-13 DIAGNOSIS — J45909 Unspecified asthma, uncomplicated: Secondary | ICD-10-CM

## 2013-04-13 NOTE — Telephone Encounter (Signed)
Pt is calling wanting to come in and get Xolair inj Missed appt in Feb--pt states that she did not receive a reminder call to come in. Last injection was in February 15, 2013.  Pt would like to check and make sure that it is okay to have injection today with her missing last month.  Pt requesting that we check with Dr Joya Gaskins to verify.  Please advise Dr Joya Gaskins. Thanks.

## 2013-04-13 NOTE — Telephone Encounter (Signed)
Pt is aware that she can receive her Xolair injection. She is going to come by today. Alroy Bailiff is aware that she will be coming. Advised pt to hang around for at least 15-20 minutes after injection just to make sure she doesn't have a reaction.

## 2013-04-13 NOTE — Telephone Encounter (Signed)
This is fine to come in now for xolair despite missing the Jan dose

## 2013-04-14 ENCOUNTER — Ambulatory Visit: Payer: Medicare Other

## 2013-04-14 MED ORDER — OMALIZUMAB 150 MG ~~LOC~~ SOLR
150.0000 mg | Freq: Once | SUBCUTANEOUS | Status: AC
  Administered 2013-04-14: 150 mg via SUBCUTANEOUS

## 2013-04-30 ENCOUNTER — Encounter: Payer: Self-pay | Admitting: Internal Medicine

## 2013-05-17 ENCOUNTER — Ambulatory Visit (INDEPENDENT_AMBULATORY_CARE_PROVIDER_SITE_OTHER): Payer: Medicare Other

## 2013-05-17 DIAGNOSIS — J45909 Unspecified asthma, uncomplicated: Secondary | ICD-10-CM

## 2013-05-20 MED ORDER — OMALIZUMAB 150 MG ~~LOC~~ SOLR
150.0000 mg | Freq: Once | SUBCUTANEOUS | Status: AC
  Administered 2013-05-20: 150 mg via SUBCUTANEOUS

## 2013-05-21 ENCOUNTER — Encounter: Payer: Self-pay | Admitting: Internal Medicine

## 2013-05-21 ENCOUNTER — Ambulatory Visit (INDEPENDENT_AMBULATORY_CARE_PROVIDER_SITE_OTHER): Payer: Medicare Other | Admitting: Internal Medicine

## 2013-05-21 VITALS — BP 114/72 | HR 64 | Temp 97.7°F | Resp 16 | Ht 60.0 in | Wt 142.6 lb

## 2013-05-21 DIAGNOSIS — E559 Vitamin D deficiency, unspecified: Secondary | ICD-10-CM | POA: Diagnosis not present

## 2013-05-21 DIAGNOSIS — R7309 Other abnormal glucose: Secondary | ICD-10-CM

## 2013-05-21 DIAGNOSIS — R7303 Prediabetes: Secondary | ICD-10-CM

## 2013-05-21 DIAGNOSIS — E785 Hyperlipidemia, unspecified: Secondary | ICD-10-CM

## 2013-05-21 DIAGNOSIS — Z79899 Other long term (current) drug therapy: Secondary | ICD-10-CM | POA: Diagnosis not present

## 2013-05-21 DIAGNOSIS — I10 Essential (primary) hypertension: Secondary | ICD-10-CM

## 2013-05-21 LAB — CBC WITH DIFFERENTIAL/PLATELET
BASOS PCT: 1 % (ref 0–1)
Basophils Absolute: 0.1 10*3/uL (ref 0.0–0.1)
EOS ABS: 0.1 10*3/uL (ref 0.0–0.7)
Eosinophils Relative: 2 % (ref 0–5)
HCT: 40.3 % (ref 36.0–46.0)
HEMOGLOBIN: 13.6 g/dL (ref 12.0–15.0)
Lymphocytes Relative: 24 % (ref 12–46)
Lymphs Abs: 1.4 10*3/uL (ref 0.7–4.0)
MCH: 29.7 pg (ref 26.0–34.0)
MCHC: 33.7 g/dL (ref 30.0–36.0)
MCV: 88 fL (ref 78.0–100.0)
Monocytes Absolute: 0.6 10*3/uL (ref 0.1–1.0)
Monocytes Relative: 10 % (ref 3–12)
NEUTROS PCT: 63 % (ref 43–77)
Neutro Abs: 3.8 10*3/uL (ref 1.7–7.7)
PLATELETS: 260 10*3/uL (ref 150–400)
RBC: 4.58 MIL/uL (ref 3.87–5.11)
RDW: 14.5 % (ref 11.5–15.5)
WBC: 6 10*3/uL (ref 4.0–10.5)

## 2013-05-21 LAB — HEMOGLOBIN A1C
HEMOGLOBIN A1C: 5.6 % (ref <5.7)
MEAN PLASMA GLUCOSE: 114 mg/dL (ref <117)

## 2013-05-21 NOTE — Progress Notes (Signed)
Patient ID: Kathy Whitaker, female   DOB: 07/31/1933, 80 y.o.   MRN: [id number]    This very nice 80 y.o. MWF presents for 3 month follow up with Hypertension, Hyperlipidemia, Chronic Asthma, Pre-Diabetes and Vitamin D Deficiency. Other Significant problems include non tobacco related  Chronic atopic asthma followed by Dr Joya Gaskins and controlled with Xolair since 2007.   HTN predates since 2004. BP has been controlled at home. Today's BP: 114/72 mmHg. She had a negative Stress Myoview in 2013 by Dr Johnsie Cancel. Patient denies any cardiac type chest pain, palpitations, dyspnea/orthopnea/PND, dizziness, claudication, or dependent edema.   Hyperlipidemia is controlled with diet & meds. Last Cholesterol was 175, Triglycerides were 121, HDL 62 and LDL  89 in Jan 2015 - all at goal.. Patient denies myalgias or other med SE's.    Also, the patient has history of PreDiabetes with A1c 5.7% in 2012 and last A1c was also 5.7% in Jan 2015 . Patient denies any symptoms of reactive hypoglycemia, diabetic polys, paresthesias or visual blurring.   Further, Patient has history of Vitamin D Deficiency with last vitamin D of 55 in Oct 2014. Patient supplements vitamin D without any suspected side-effects.  Medication Sig  . albuterol-ipratropium (COMBIVENT)  Inhale 2 puffs into the lungs every 6 (six) hours as needed  . budesonide (PULMICORT) 0.25 MG/2ML nebulizer s Take 2 mLs (0.25 mg total) by nebulization daily  . calcium-vitamin D  500-200  Take 1 tablet by mouth daily.    [name] EPINEPHrine (EPIPEN JR) 0.15 MG/0.3ML injec Inject 0.15 mg into the muscle as needed.    . formoterol (PERFOROMIST) 20 MCG/2ML  Take 2 mLs (20 mcg total) by nebulization 2 (two) times daily  . levothyroxine  75 MCG tablet Take 75 mcg by mouth daily.    [name] loratadine (CLARITIN) 10 MG tablet Take 10 mg by mouth daily.    [name] omalizumab (XOLAIR) 150 MG injection Inject 150 mg into the skin every 28 (twenty-eight) days.    [name] sertraline (ZOLOFT) 50 MG tablet  Take 50 mg by mouth daily.    . simvastatin (ZOCOR) 40 MG tablet Take 40 mg by mouth at bedtime.    . zoster vaccine live, PF, (ZOSTAVAX) 19147 UNT/0.65ML injection Inject 19,400 Units into the skin once.   Allergies  Allergen Reactions  . Codeine   . Propoxyphene N-Acetaminophen   . Shellfish-Derived Products    PMHx:   Past Medical History  Diagnosis Date  . Asthma   . GERD (gastroesophageal reflux disease)   . Hyperlipemia   . Hypothyroidism   . Hypertension   . COPD (chronic obstructive pulmonary disease)   . Prediabetes   . Vitamin D deficiency     FHx:    Reviewed / unchanged  SHx:    Reviewed / unchanged   Systems Review: Constitutional: Denies fever, chills, wt changes, headaches, insomnia, fatigue, night sweats, change in appetite. Eyes: Denies redness, blurred vision, diplopia, discharge, itchy, watery eyes.  ENT: Denies discharge, congestion, post nasal drip, epistaxis, sore throat, earache, hearing loss, dental pain, tinnitus, vertigo, sinus pain, snoring.  CV: Denies chest pain, palpitations, irregular heartbeat, syncope, dyspnea, diaphoresis, orthopnea, PND, claudication, edema. Respiratory: denies cough, dyspnea, DOE, pleurisy, hoarseness, laryngitis, wheezing.  Gastrointestinal: Denies dysphagia, odynophagia, heartburn, reflux, water brash, abdominal pain or cramps, nausea, vomiting, bloating, diarrhea, constipation, hematemesis, melena, hematochezia,  or hemorrhoids. Genitourinary: Denies dysuria, frequency, urgency, nocturia, hesitancy, discharge, hematuria, flank pain. Musculoskeletal: Denies arthralgias, myalgias, stiffness, jt. swelling, pain, limp, strain/sprain.  Skin:  Denies pruritus, rash, hives, warts, acne, eczema, change in skin lesion(s). Neuro: No weakness, tremor, incoordination, spasms, paresthesia, or pain. Psychiatric: Denies confusion, memory loss, or sensory loss. Endo: Denies change in weight, skin, hair change.  Heme/Lymph: No excessive  bleeding, bruising, orenlarged lymph nodes.   Exam:  BP 114/72  Pulse 64  Temp(Src) 97.7 F (36.5 C) (Temporal)  Resp 16  Ht 5' (1.524 m)  Wt 142 lb 9.6 oz (64.683 kg)  BMI 27.85 kg/m2  Appears well nourished - in no distress. Eyes: PERRLA, EOMs, conjunctiva no swelling or erythema. Sinuses: No frontal/maxillary tenderness ENT/Mouth: EAC's clear, TM's nl w/o erythema, bulging. Nares clear w/o erythema, swelling, exudates. Oropharynx clear without erythema or exudates. Oral hygiene is good. Tongue normal, non obstructing. Hearing intact.  Neck: Supple. Thyroid nl. Car 2+/2+ without bruits, nodes or JVD. Chest: Respirations nl with BS clear & equal w/o rales, rhonchi, wheezing or stridor.  Cor: Heart sounds normal w/ regular rate and rhythm without sig. murmurs, gallops, clicks, or rubs. Peripheral pulses normal and equal  without edema.  Abdomen: Soft & bowel sounds normal. Non-tender w/o guarding, rebound, hernias, masses, or organomegaly.  Lymphatics: Unremarkable.  Musculoskeletal: Full ROM all peripheral extremities, joint stability, 5/5 strength, and normal gait.  Skin: Warm, dry without exposed rashes, lesions, ecchymosis apparent.  Neuro: Cranial nerves intact, reflexes equal bilaterally. Sensory-motor testing grossly intact. Tendon reflexes grossly intact.  Pysch: Alert & oriented x 3. Insight and judgement nl & appropriate. No ideations.  Assessment and Plan:  1. Hypertension - Continue monitor blood pressure at home. Continue diet/meds same.  2. Hyperlipidemia - Continue diet/meds, exercise,& lifestyle modifications. Continue monitor periodic cholesterol/liver & renal functions   3. Pre-diabetes - Continue diet, exercise, lifestyle modifications. Monitor appropriate labs.  4. Vitamin D Deficiency - Continue supplementation.  5. Hypothyroidism  6. Chronic Atopic Asthma  6. Asthma  Recommended regular exercise, BP monitoring, weight control, and discussed med and  SE's. Recommended labs to assess and monitor clinical status. Further disposition pending results of labs.

## 2013-05-21 NOTE — Patient Instructions (Signed)

## 2013-05-22 LAB — LIPID PANEL
CHOL/HDL RATIO: 2.8 ratio
Cholesterol: 184 mg/dL (ref 0–200)
HDL: 65 mg/dL (ref >39)
LDL Cholesterol: 103 mg/dL — ABNORMAL HIGH (ref 0–99)
Triglycerides: 81 mg/dL (ref <150)
VLDL: 16 mg/dL (ref 0–40)

## 2013-05-22 LAB — BASIC METABOLIC PANEL WITH GFR
BUN: 21 mg/dL (ref 6–23)
CALCIUM: 9.5 mg/dL (ref 8.4–10.5)
CO2: 30 mEq/L (ref 19–32)
CREATININE: 0.79 mg/dL (ref 0.50–1.10)
Chloride: 100 mEq/L (ref 96–112)
GFR, EST AFRICAN AMERICAN: 84 mL/min
GFR, Est Non African American: 72 mL/min
GLUCOSE: 84 mg/dL (ref 70–99)
Potassium: 4.3 mEq/L (ref 3.5–5.3)
SODIUM: 138 meq/L (ref 135–145)

## 2013-05-22 LAB — HEPATIC FUNCTION PANEL
ALBUMIN: 4.1 g/dL (ref 3.5–5.2)
ALT: 13 U/L (ref 0–35)
AST: 21 U/L (ref 0–37)
Alkaline Phosphatase: 74 U/L (ref 39–117)
BILIRUBIN DIRECT: 0.1 mg/dL (ref 0.0–0.3)
Indirect Bilirubin: 0.4 mg/dL (ref 0.2–1.2)
TOTAL PROTEIN: 6.6 g/dL (ref 6.0–8.3)
Total Bilirubin: 0.5 mg/dL (ref 0.2–1.2)

## 2013-05-22 LAB — TSH: TSH: 1.517 u[IU]/mL (ref 0.350–4.500)

## 2013-05-22 LAB — VITAMIN D 25 HYDROXY (VIT D DEFICIENCY, FRACTURES): VIT D 25 HYDROXY: 53 ng/mL (ref 30–89)

## 2013-05-22 LAB — MAGNESIUM: MAGNESIUM: 1.8 mg/dL (ref 1.5–2.5)

## 2013-05-22 LAB — INSULIN, FASTING: Insulin fasting, serum: 8 u[IU]/mL (ref 3–28)

## 2013-06-16 ENCOUNTER — Ambulatory Visit (INDEPENDENT_AMBULATORY_CARE_PROVIDER_SITE_OTHER): Payer: Medicare Other

## 2013-06-16 DIAGNOSIS — J45909 Unspecified asthma, uncomplicated: Secondary | ICD-10-CM | POA: Diagnosis not present

## 2013-06-17 MED ORDER — OMALIZUMAB 150 MG ~~LOC~~ SOLR
150.0000 mg | Freq: Once | SUBCUTANEOUS | Status: AC
  Administered 2013-06-17: 150 mg via SUBCUTANEOUS

## 2013-07-13 ENCOUNTER — Ambulatory Visit: Payer: Medicare Other

## 2013-07-18 ENCOUNTER — Other Ambulatory Visit: Payer: Self-pay | Admitting: Internal Medicine

## 2013-07-20 ENCOUNTER — Ambulatory Visit (INDEPENDENT_AMBULATORY_CARE_PROVIDER_SITE_OTHER): Payer: Medicare Other

## 2013-07-20 ENCOUNTER — Ambulatory Visit (INDEPENDENT_AMBULATORY_CARE_PROVIDER_SITE_OTHER): Payer: Medicare Other | Admitting: Physician Assistant

## 2013-07-20 ENCOUNTER — Encounter: Payer: Self-pay | Admitting: Physician Assistant

## 2013-07-20 VITALS — BP 102/60 | HR 84 | Temp 99.0°F | Resp 16 | Ht 60.0 in | Wt 145.0 lb

## 2013-07-20 DIAGNOSIS — N3000 Acute cystitis without hematuria: Secondary | ICD-10-CM

## 2013-07-20 DIAGNOSIS — R1903 Right lower quadrant abdominal swelling, mass and lump: Secondary | ICD-10-CM

## 2013-07-20 DIAGNOSIS — R109 Unspecified abdominal pain: Secondary | ICD-10-CM | POA: Diagnosis not present

## 2013-07-20 DIAGNOSIS — J45909 Unspecified asthma, uncomplicated: Secondary | ICD-10-CM

## 2013-07-20 LAB — CBC WITH DIFFERENTIAL/PLATELET
BASOS PCT: 0 % (ref 0–1)
Basophils Absolute: 0 10*3/uL (ref 0.0–0.1)
Eosinophils Absolute: 0 10*3/uL (ref 0.0–0.7)
Eosinophils Relative: 0 % (ref 0–5)
HEMATOCRIT: 38.9 % (ref 36.0–46.0)
Hemoglobin: 13 g/dL (ref 12.0–15.0)
LYMPHS PCT: 11 % — AB (ref 12–46)
Lymphs Abs: 1.3 10*3/uL (ref 0.7–4.0)
MCH: 29.5 pg (ref 26.0–34.0)
MCHC: 33.4 g/dL (ref 30.0–36.0)
MCV: 88.4 fL (ref 78.0–100.0)
MONO ABS: 0.8 10*3/uL (ref 0.1–1.0)
MONOS PCT: 7 % (ref 3–12)
Neutro Abs: 9.6 10*3/uL — ABNORMAL HIGH (ref 1.7–7.7)
Neutrophils Relative %: 82 % — ABNORMAL HIGH (ref 43–77)
Platelets: 255 10*3/uL (ref 150–400)
RBC: 4.4 MIL/uL (ref 3.87–5.11)
RDW: 14.3 % (ref 11.5–15.5)
WBC: 11.7 10*3/uL — ABNORMAL HIGH (ref 4.0–10.5)

## 2013-07-20 LAB — BASIC METABOLIC PANEL WITH GFR
BUN: 16 mg/dL (ref 6–23)
CHLORIDE: 101 meq/L (ref 96–112)
CO2: 28 mEq/L (ref 19–32)
CREATININE: 0.9 mg/dL (ref 0.50–1.10)
Calcium: 10 mg/dL (ref 8.4–10.5)
GFR, Est African American: 71 mL/min
GFR, Est Non African American: 62 mL/min
GLUCOSE: 79 mg/dL (ref 70–99)
POTASSIUM: 4.3 meq/L (ref 3.5–5.3)
Sodium: 137 mEq/L (ref 135–145)

## 2013-07-20 MED ORDER — CIPROFLOXACIN HCL 500 MG PO TABS: 500.0000 mg | ORAL_TABLET | Freq: Two times a day (BID) | ORAL | Status: AC

## 2013-07-20 NOTE — Patient Instructions (Signed)
Take Cipro twice a day for 7 days (14 pill) If you are not feeling better in 24-48 hours call the office If you get fever, chills, severe AB or back pain, nausea/vomiting you may need to go to the ER  Urinary Tract Infection Urinary tract infections (UTIs) can develop anywhere along your urinary tract. Your urinary tract is your body's drainage system for removing wastes and extra water. Your urinary tract includes two kidneys, two ureters, a bladder, and a urethra. Your kidneys are a pair of bean-shaped organs. Each kidney is about the size of your fist. They are located below your ribs, one on each side of your spine. CAUSES Infections are caused by microbes, which are microscopic organisms, including fungi, viruses, and bacteria. These organisms are so small that they can only be seen through a microscope. Bacteria are the microbes that most commonly cause UTIs. SYMPTOMS  Symptoms of UTIs may vary by age and gender of the patient and by the location of the infection. Symptoms in young women typically include a frequent and intense urge to urinate and a painful, burning feeling in the bladder or urethra during urination. Older women and men are more likely to be tired, shaky, and weak and have muscle aches and abdominal pain. A fever may mean the infection is in your kidneys. Other symptoms of a kidney infection include pain in your back or sides below the ribs, nausea, and vomiting. DIAGNOSIS To diagnose a UTI, your caregiver will ask you about your symptoms. Your caregiver also will ask to provide a urine sample. The urine sample will be tested for bacteria and white blood cells. White blood cells are made by your body to help fight infection. TREATMENT  Typically, UTIs can be treated with medication. Because most UTIs are caused by a bacterial infection, they usually can be treated with the use of antibiotics. The choice of antibiotic and length of treatment depend on your symptoms and the type of  bacteria causing your infection. HOME CARE INSTRUCTIONS  If you were prescribed antibiotics, take them exactly as your caregiver instructs you. Finish the medication even if you feel better after you have only taken some of the medication.  Drink enough water and fluids to keep your urine clear or pale yellow.  Avoid caffeine, tea, and carbonated beverages. They tend to irritate your bladder.  Empty your bladder often. Avoid holding urine for long periods of time.  Empty your bladder before and after sexual intercourse.  After a bowel movement, women should cleanse from front to back. Use each tissue only once. SEEK MEDICAL CARE IF:   You have back pain.  You develop a fever.  Your symptoms do not begin to resolve within 3 days. SEEK IMMEDIATE MEDICAL CARE IF:   You have severe back pain or lower abdominal pain.  You develop chills.  You have nausea or vomiting.  You have continued burning or discomfort with urination. MAKE SURE YOU:   Understand these instructions.  Will watch your condition.  Will get help right away if you are not doing well or get worse. Document Released: 10/31/2004 Document Revised: 07/23/2011 Document Reviewed: 03/01/2011 [facility] Patient Information 2014 [place].

## 2013-07-20 NOTE — Progress Notes (Signed)
   Subjective:    Patient ID: Kathy Whitaker, female    DOB: 02/23/1933, 80 y.o.   MRN: [id number]  HPI 80 y.o. female presents with possible UTI. She states this AM she felt a heaviness in her lower abdomen while sitting, she went to stand and had incontinence. Since then she has been having frequency, urgency, dysuria, and decreased amount of urine. Denies fever, chills, LBP.   Review of Systems  Constitutional: Positive for fatigue. Negative for fever, chills and diaphoresis.  HENT: Negative.   Respiratory: Negative.   Cardiovascular: Negative.   Gastrointestinal: Positive for abdominal pain. Negative for nausea, vomiting, diarrhea, constipation, blood in stool, abdominal distention, anal bleeding and rectal pain.  Genitourinary: Positive for dysuria, urgency, frequency and decreased urine volume.  Musculoskeletal: Negative.   Neurological: Negative.        Objective:   Physical Exam  Constitutional: She is oriented to person, place, and time. She appears well-developed and well-nourished.  HENT:  Head: Normocephalic and atraumatic.  Neck: Normal range of motion. Neck supple.  Cardiovascular: Normal rate and regular rhythm.   Pulmonary/Chest: Effort normal and breath sounds normal.  Abdominal: Soft. Bowel sounds are normal. She exhibits no distension and no mass. There is tenderness (RLQ/LLQ tenderness with subrapubic tenderness, no rebound, quesitonable lymphnode/mass on RLQ). There is no rebound and no guarding.  Musculoskeletal: Normal range of motion. She exhibits no tenderness.  Neurological: She is alert and oriented to person, place, and time.  Skin: Skin is warm and dry.      Assessment & Plan:  RLQ abdominal mass questionable- with pain and still having ovaries/uterus will get [place] - Plan: US Abdomen Limited, US Transvaginal Non-OB  Acute cystitis - Plan: Urinalysis, Routine w reflex microscopic, Urine culture, CBC with Differential, BASIC METABOLIC PANEL WITH GFR

## 2013-07-21 ENCOUNTER — Other Ambulatory Visit: Payer: Self-pay

## 2013-07-21 DIAGNOSIS — R1903 Right lower quadrant abdominal swelling, mass and lump: Secondary | ICD-10-CM

## 2013-07-21 DIAGNOSIS — R109 Unspecified abdominal pain: Secondary | ICD-10-CM

## 2013-07-21 LAB — URINALYSIS, MICROSCOPIC ONLY
BACTERIA UA: NONE SEEN
CASTS: NONE SEEN
Crystals: NONE SEEN
Squamous Epithelial / LPF: NONE SEEN

## 2013-07-21 LAB — URINALYSIS, ROUTINE W REFLEX MICROSCOPIC
Bilirubin Urine: NEGATIVE
Glucose, UA: NEGATIVE mg/dL
Ketones, ur: NEGATIVE mg/dL
NITRITE: NEGATIVE
Protein, ur: NEGATIVE mg/dL
Specific Gravity, Urine: 1.01 (ref 1.005–1.030)
Urobilinogen, UA: 0.2 mg/dL (ref 0.0–1.0)
pH: 5.5 (ref 5.0–8.0)

## 2013-07-22 LAB — URINE CULTURE: Colony Count: 15000

## 2013-07-22 MED ORDER — OMALIZUMAB 150 MG ~~LOC~~ SOLR
150.0000 mg | Freq: Once | SUBCUTANEOUS | Status: AC
  Administered 2013-07-22: 150 mg via SUBCUTANEOUS

## 2013-07-28 ENCOUNTER — Ambulatory Visit
Admission: RE | Admit: 2013-07-28 | Discharge: 2013-07-28 | Disposition: A | Discharge status: Home / Self Care | Payer: Medicare Other | Source: Ambulatory Visit | Attending: Physician Assistant | Admitting: Physician Assistant

## 2013-07-28 DIAGNOSIS — K8020 Calculus of gallbladder without cholecystitis without obstruction: Secondary | ICD-10-CM | POA: Diagnosis not present

## 2013-07-28 DIAGNOSIS — R109 Unspecified abdominal pain: Secondary | ICD-10-CM

## 2013-07-28 DIAGNOSIS — R1903 Right lower quadrant abdominal swelling, mass and lump: Secondary | ICD-10-CM

## 2013-07-29 ENCOUNTER — Other Ambulatory Visit: Payer: Self-pay | Admitting: Physician Assistant

## 2013-07-29 ENCOUNTER — Telehealth: Payer: Self-pay | Admitting: Physician Assistant

## 2013-07-29 DIAGNOSIS — R935 Abnormal findings on diagnostic imaging of other abdominal regions, including retroperitoneum: Secondary | ICD-10-CM

## 2013-07-29 NOTE — Telephone Encounter (Signed)
Patient called in regards to AB [place], she will follow up with Dr. Radene Knee her OB/GYN.

## 2013-08-05 ENCOUNTER — Telehealth: Payer: Self-pay | Admitting: *Deleted

## 2013-08-05 MED ORDER — AMOXICILLIN 500 MG PO CAPS: 500.0000 mg | ORAL_CAPSULE | Freq: Three times a day (TID) | ORAL | Status: AC

## 2013-08-05 NOTE — Telephone Encounter (Signed)
[facility] = [place] [facility]  ________________________ Still having UTI symptoms has a appt with the OB next wk but said you had told her to call if she had finished cipro & still had symptoms that you would change her RX to something else to get her through till her OB appt to try to keep her comfortable. Please let pt know

## 2013-08-12 DIAGNOSIS — R9389 Abnormal findings on diagnostic imaging of other specified body structures: Secondary | ICD-10-CM | POA: Diagnosis not present

## 2013-08-19 ENCOUNTER — Ambulatory Visit (INDEPENDENT_AMBULATORY_CARE_PROVIDER_SITE_OTHER): Payer: Medicare Other

## 2013-08-19 DIAGNOSIS — J45909 Unspecified asthma, uncomplicated: Secondary | ICD-10-CM

## 2013-08-23 MED ORDER — OMALIZUMAB 150 MG ~~LOC~~ SOLR
150.0000 mg | Freq: Once | SUBCUTANEOUS | Status: AC
  Administered 2013-08-23: 150 mg via SUBCUTANEOUS

## 2013-09-02 NOTE — H&P (Signed)
NAME:  Kathy Whitaker, Kathy Whitaker                  ACCOUNT NO.:  [id number]  MEDICAL RECORD NO.:  [id number]  LOCATION:                                 FACILITY:  PHYSICIAN:  Darlyn Chamber, M.D.        DATE OF BIRTH:  DATE OF ADMISSION: DATE OF DISCHARGE:                             HISTORY & PHYSICAL   Date of her surgery is September 10, 2013 at [facility].  HISTORY OF PRESENT ILLNESS:  The patient is a 80 year old postmenopausal patient, presents for hysteroscopy, D and C.  She underwent an ultrasound evaluation with finding of fluid within the endometrial cavity.  She was having no bleeding.  She came in, we tried to do an endometrial sampling and saline infusion; however, she was remarkably uncomfortable for.  We tried and she was too uncomfortable and therefore, we stopped the procedure.  We offered to do it in the office with paracervical block.  She said she was ready to do an in-hospital procedure, for which, she was admitted at the present time.  ALLERGIES:  She is allergic to CODEINE, PROPOXYPHENE AND ACETAMINOPHEN, SHELLFISH, DERIVATIVE PRODUCTS.  MEDICATIONS:  She is on levothyroxine, Zocor, Zoloft, vitamin D, calcium.  PAST MEDICAL HISTORY:  She has a history of hypothyroidism as well as asthmatic bronchitis, also history of osteoporosis.  OBSTETRICAL HISTORY:  She has had two vaginal deliveries, has had one bilateral tubal ligation.  FAMILY HISTORY:  Noncontributory.  SOCIAL HISTORY:  Reveals no alcohol or tobacco use.  REVIEW OF SYSTEMS:  Noncontributory.  PHYSICAL EXAMINATION:  VITAL SIGNS:  The patient is afebrile with stable vital signs. HEENT:  The patient is normocephalic.  Pupils are equal, round, reactive to light and accommodation.  Extraocular movements are intact.  Sclerae and conjunctivae are clear.  Oropharynx clear. NECK:  Without thyromegaly. BREASTS:  Not examined. LUNGS:  Clear. CARDIOVASCULAR:  Regular rate.  No murmurs or  gallops. ABDOMEN:  Benign.  No masses, organomegaly, or tenderness. PELVIC:  Normal external genitalia.  Vaginal mucosa is clear.  Cervix unremarkable.  Uterus normal size, shape, and contour.  Adnexa free of masses or tenderness. EXTREMITIES:  Trace edema. NEUROLOGIC:  Grossly within limits.  IMPRESSION:  Postmenopausal with intrauterine collection of fluid.  PLAN:  The patient undergo cervical dilation, hysteroscopy, D and C. Risk of procedure have been discussed including the risk of infection. The risk of hemorrhage, could require transfusion with the risk of AIDS or hepatitis.  Risk of perforation with injury to adjacent organs, this could require further exploratory surgery, risk of deep venous thrombosis and pulmonary embolus.  Excessive bleeding, could require hysterectomy.  The patient expressed understanding of indications and risks.     Darlyn Chamber, M.D.     JSM/MEDQ  D:  09/02/2013  T:  09/02/2013  Job:  [id number]

## 2013-09-02 NOTE — H&P (Signed)
  Patient name  Latina, Frank DICTATION# [id number] CSN# [id number]  [facility], MD 09/02/2013 10:06 AM

## 2013-09-07 ENCOUNTER — Encounter (HOSPITAL_COMMUNITY): Payer: Self-pay

## 2013-09-07 ENCOUNTER — Encounter (HOSPITAL_COMMUNITY)
Admission: RE | Admit: 2013-09-07 | Discharge: 2013-09-07 | Disposition: A | Discharge status: Home / Self Care | Payer: Medicare Other | Source: Ambulatory Visit | Attending: Obstetrics and Gynecology | Admitting: Obstetrics and Gynecology

## 2013-09-07 ENCOUNTER — Encounter (HOSPITAL_COMMUNITY): Payer: Self-pay | Admitting: Pharmacist

## 2013-09-07 DIAGNOSIS — N859 Noninflammatory disorder of uterus, unspecified: Secondary | ICD-10-CM | POA: Diagnosis not present

## 2013-09-07 DIAGNOSIS — J4489 Other specified chronic obstructive pulmonary disease: Secondary | ICD-10-CM | POA: Diagnosis not present

## 2013-09-07 DIAGNOSIS — M810 Age-related osteoporosis without current pathological fracture: Secondary | ICD-10-CM | POA: Diagnosis not present

## 2013-09-07 DIAGNOSIS — J449 Chronic obstructive pulmonary disease, unspecified: Secondary | ICD-10-CM | POA: Diagnosis not present

## 2013-09-07 DIAGNOSIS — N950 Postmenopausal bleeding: Secondary | ICD-10-CM | POA: Diagnosis not present

## 2013-09-07 HISTORY — DX: Major depressive disorder, single episode, unspecified: F32.9

## 2013-09-07 HISTORY — DX: Depression, unspecified: F32.A

## 2013-09-07 LAB — BASIC METABOLIC PANEL
Anion gap: 10 (ref 5–15)
BUN: 14 mg/dL (ref 6–23)
CHLORIDE: 102 meq/L (ref 96–112)
CO2: 28 mEq/L (ref 19–32)
Calcium: 9.4 mg/dL (ref 8.4–10.5)
Creatinine, Ser: 0.85 mg/dL (ref 0.50–1.10)
GFR calc Af Amer: 75 mL/min — ABNORMAL LOW (ref >90)
GFR, EST NON AFRICAN AMERICAN: 64 mL/min — AB (ref >90)
GLUCOSE: 92 mg/dL (ref 70–99)
Potassium: 4.4 mEq/L (ref 3.7–5.3)
Sodium: 140 mEq/L (ref 137–147)

## 2013-09-07 LAB — CBC
HEMATOCRIT: 39.9 % (ref 36.0–46.0)
HEMOGLOBIN: 13.1 g/dL (ref 12.0–15.0)
MCH: 30.3 pg (ref 26.0–34.0)
MCHC: 32.8 g/dL (ref 30.0–36.0)
MCV: 92.4 fL (ref 78.0–100.0)
Platelets: 239 10*3/uL (ref 150–400)
RBC: 4.32 MIL/uL (ref 3.87–5.11)
RDW: 14.3 % (ref 11.5–15.5)
WBC: 7 10*3/uL (ref 4.0–10.5)

## 2013-09-07 NOTE — Patient Instructions (Addendum)
   Your procedure is scheduled on: Friday, August 7  Enter through the [employer] [facility] at: 6 AM Pick up the phone at the desk and dial [phone] and inform us of your arrival.  Please call this number if you have any problems the morning of surgery: [phone]  Remember: Do not eat or drink after midnight: Thursday Take these medicines the morning of surgery with a SIP OF WATER:  pulmicort neb treatment, synthroid, zoloft.  Bring albuterol inhaler with you on day of surgery.  Do not wear jewelry, make-up, or FINGER nail polish No metal in your hair or on your body. Do not wear lotions, powders, perfumes.  You may wear deodorant.  Do not bring valuables to the hospital. Contacts, dentures or bridgework may not be worn into surgery.  Patients discharged on the day of surgery will not be allowed to drive home.  Home with husband Juanda Crumble

## 2013-09-10 ENCOUNTER — Encounter (HOSPITAL_COMMUNITY): Payer: Self-pay | Admitting: *Deleted

## 2013-09-10 ENCOUNTER — Encounter (HOSPITAL_COMMUNITY)
Admission: RE | Disposition: A | Discharge status: Home / Self Care | Payer: Self-pay | Source: Ambulatory Visit | Attending: Obstetrics and Gynecology

## 2013-09-10 ENCOUNTER — Encounter (HOSPITAL_COMMUNITY): Payer: Medicare Other | Admitting: Anesthesiology

## 2013-09-10 ENCOUNTER — Ambulatory Visit (HOSPITAL_COMMUNITY)
Admission: RE | Admit: 2013-09-10 | Discharge: 2013-09-10 | Disposition: A | Discharge status: Home / Self Care | Payer: Medicare Other | Source: Ambulatory Visit | Attending: Obstetrics and Gynecology | Admitting: Obstetrics and Gynecology

## 2013-09-10 ENCOUNTER — Ambulatory Visit (HOSPITAL_COMMUNITY): Payer: Medicare Other | Admitting: Anesthesiology

## 2013-09-10 DIAGNOSIS — J4489 Other specified chronic obstructive pulmonary disease: Secondary | ICD-10-CM | POA: Insufficient documentation

## 2013-09-10 DIAGNOSIS — M810 Age-related osteoporosis without current pathological fracture: Secondary | ICD-10-CM | POA: Diagnosis not present

## 2013-09-10 DIAGNOSIS — N859 Noninflammatory disorder of uterus, unspecified: Secondary | ICD-10-CM | POA: Diagnosis not present

## 2013-09-10 DIAGNOSIS — N950 Postmenopausal bleeding: Secondary | ICD-10-CM | POA: Diagnosis not present

## 2013-09-10 DIAGNOSIS — J449 Chronic obstructive pulmonary disease, unspecified: Secondary | ICD-10-CM | POA: Diagnosis not present

## 2013-09-10 HISTORY — PX: DILATATION & CURRETTAGE/HYSTEROSCOPY WITH RESECTOCOPE: SHX5572

## 2013-09-10 SURGERY — DILATATION & CURETTAGE/HYSTEROSCOPY WITH RESECTOCOPE
Anesthesia: General | Site: Vagina

## 2013-09-10 MED ORDER — LIDOCAINE HCL 1 % IJ SOLN
INTRAMUSCULAR | Status: AC
  Filled 2013-09-10: qty 20

## 2013-09-10 MED ORDER — SCOPOLAMINE 1 MG/3DAYS TD PT72: 1.0000 | MEDICATED_PATCH | Freq: Once | TRANSDERMAL | Status: DC

## 2013-09-10 MED ORDER — MIDAZOLAM HCL 2 MG/2ML IJ SOLN
INTRAMUSCULAR | Status: DC | PRN
  Administered 2013-09-10 (×2): 1 mg via INTRAVENOUS

## 2013-09-10 MED ORDER — FENTANYL CITRATE 0.05 MG/ML IJ SOLN
INTRAMUSCULAR | Status: AC
  Filled 2013-09-10: qty 2

## 2013-09-10 MED ORDER — LIDOCAINE HCL (CARDIAC) 20 MG/ML IV SOLN
INTRAVENOUS | Status: DC | PRN
  Administered 2013-09-10: 80 mg via INTRAVENOUS

## 2013-09-10 MED ORDER — LIDOCAINE-EPINEPHRINE (PF) 1 %-1:200000 IJ SOLN
INTRAMUSCULAR | Status: AC
  Filled 2013-09-10: qty 10

## 2013-09-10 MED ORDER — HYDROMORPHONE HCL 2 MG PO TABS: 2.0000 mg | ORAL_TABLET | ORAL | Status: DC | PRN

## 2013-09-10 MED ORDER — ONDANSETRON HCL 4 MG/2ML IJ SOLN
INTRAMUSCULAR | Status: DC | PRN
  Administered 2013-09-10: 4 mg via INTRAVENOUS

## 2013-09-10 MED ORDER — SCOPOLAMINE 1 MG/3DAYS TD PT72
MEDICATED_PATCH | TRANSDERMAL | Status: AC
  Filled 2013-09-10: qty 1

## 2013-09-10 MED ORDER — FENTANYL CITRATE 0.05 MG/ML IJ SOLN: 25.0000 ug | INTRAMUSCULAR | Status: DC | PRN

## 2013-09-10 MED ORDER — GLYCINE 1.5 % IR SOLN
Status: DC | PRN
Start: 2013-09-10 — End: 2013-09-10
  Administered 2013-09-10: 3000 mL

## 2013-09-10 MED ORDER — DEXAMETHASONE SODIUM PHOSPHATE 10 MG/ML IJ SOLN
INTRAMUSCULAR | Status: DC | PRN
  Administered 2013-09-10: 10 mg via INTRAVENOUS

## 2013-09-10 MED ORDER — MEPERIDINE HCL 25 MG/ML IJ SOLN
6.2500 mg | INTRAMUSCULAR | Status: DC | PRN
Start: 2013-09-10 — End: 2013-09-10

## 2013-09-10 MED ORDER — FENTANYL CITRATE 0.05 MG/ML IJ SOLN
INTRAMUSCULAR | Status: DC | PRN
  Administered 2013-09-10 (×2): 50 ug via INTRAVENOUS

## 2013-09-10 MED ORDER — DEXAMETHASONE SODIUM PHOSPHATE 10 MG/ML IJ SOLN
INTRAMUSCULAR | Status: AC
  Filled 2013-09-10: qty 1

## 2013-09-10 MED ORDER — LIDOCAINE HCL (CARDIAC) 20 MG/ML IV SOLN
INTRAVENOUS | Status: AC
  Filled 2013-09-10: qty 5

## 2013-09-10 MED ORDER — PROPOFOL 10 MG/ML IV BOLUS
INTRAVENOUS | Status: DC | PRN
  Administered 2013-09-10: 130 mg via INTRAVENOUS

## 2013-09-10 MED ORDER — PROPOFOL 10 MG/ML IV EMUL
INTRAVENOUS | Status: AC
  Filled 2013-09-10: qty 20

## 2013-09-10 MED ORDER — ARTIFICIAL TEARS OP OINT
TOPICAL_OINTMENT | OPHTHALMIC | Status: AC
  Filled 2013-09-10: qty 3.5

## 2013-09-10 MED ORDER — ONDANSETRON HCL 4 MG/2ML IJ SOLN: 4.0000 mg | Freq: Once | INTRAMUSCULAR | Status: DC | PRN

## 2013-09-10 MED ORDER — LACTATED RINGERS IV SOLN
INTRAVENOUS | Status: DC
  Administered 2013-09-10 (×2): via INTRAVENOUS

## 2013-09-10 MED ORDER — CEFAZOLIN SODIUM-DEXTROSE 2-3 GM-% IV SOLR
2.0000 g | INTRAVENOUS | Status: AC
  Administered 2013-09-10: 2 g via INTRAVENOUS

## 2013-09-10 MED ORDER — KETOROLAC TROMETHAMINE 30 MG/ML IJ SOLN
INTRAMUSCULAR | Status: AC
  Filled 2013-09-10: qty 1

## 2013-09-10 MED ORDER — ONDANSETRON HCL 4 MG/2ML IJ SOLN
INTRAMUSCULAR | Status: AC
  Filled 2013-09-10: qty 2

## 2013-09-10 MED ORDER — CEFAZOLIN SODIUM-DEXTROSE 2-3 GM-% IV SOLR
INTRAVENOUS | Status: AC
  Filled 2013-09-10: qty 50

## 2013-09-10 MED ORDER — KETOROLAC TROMETHAMINE 30 MG/ML IJ SOLN: 15.0000 mg | Freq: Once | INTRAMUSCULAR | Status: DC | PRN

## 2013-09-10 MED ORDER — MIDAZOLAM HCL 2 MG/2ML IJ SOLN
INTRAMUSCULAR | Status: AC
  Filled 2013-09-10: qty 2

## 2013-09-10 SURGICAL SUPPLY — 20 items
CANISTER SUCT 3000ML (MISCELLANEOUS) ×3 IMPLANT
CATH ROBINSON RED A/P 16FR (CATHETERS) ×3 IMPLANT
CLOTH BEACON ORANGE TIMEOUT ST (SAFETY) ×3 IMPLANT
CONTAINER PREFILL 10% NBF 60ML (FORM) ×6 IMPLANT
DRAPE HYSTEROSCOPY (DRAPE) ×3 IMPLANT
DRSG TELFA 3X8 NADH (GAUZE/BANDAGES/DRESSINGS) ×3 IMPLANT
ELECT REM PT RETURN 9FT ADLT (ELECTROSURGICAL) ×3
ELECTRODE REM PT RTRN 9FT ADLT (ELECTROSURGICAL) ×1 IMPLANT
GLOVE BIO SURGEON STRL SZ7 (GLOVE) ×3 IMPLANT
GLOVE BIOGEL PI IND STRL 6.5 (GLOVE) ×1 IMPLANT
GLOVE BIOGEL PI INDICATOR 6.5 (GLOVE) ×2
GOWN STRL REUS W/TWL LRG LVL3 (GOWN DISPOSABLE) ×6 IMPLANT
LOOP ANGLED CUTTING 22FR (CUTTING LOOP) ×3 IMPLANT
NEEDLE SPNL 20GX3.5 QUINCKE YW (NEEDLE) ×3 IMPLANT
PACK VAGINAL MINOR WOMEN LF (CUSTOM PROCEDURE TRAY) ×3 IMPLANT
PAD OB MATERNITY 4.3X12.25 (PERSONAL CARE ITEMS) ×3 IMPLANT
SET TUBING HYSTEROSCOPY 2 NDL (TUBING) IMPLANT
TOWEL OR 17X24 6PK STRL BLUE (TOWEL DISPOSABLE) ×6 IMPLANT
TUBE HYSTEROSCOPY W Y-CONNECT (TUBING) IMPLANT
WATER STERILE IRR 1000ML POUR (IV SOLUTION) ×3 IMPLANT

## 2013-09-10 NOTE — Anesthesia Postprocedure Evaluation (Signed)
Anesthesia Post Note  Patient: Kathy Whitaker  Procedure(s) Performed: Procedure(s) (LRB): DILATATION & CURETTAGE/HYSTEROSCOPY WITH RESECTOCOPE (N/A)  Anesthesia type: General  Patient location: PACU  Post pain: Pain level controlled  Post assessment: Post-op Vital signs reviewed  Last Vitals:  Filed Vitals:   09/10/13 0830  BP: 123/60  Pulse: 51  Temp:   Resp: 14    Post vital signs: Reviewed  Level of consciousness: sedated  Complications: No apparent anesthesia complications

## 2013-09-10 NOTE — Op Note (Signed)
Patient name  Lyndsey, Demos DICTATION#  [id number] CSN# [id number]  [facility], MD 09/10/2013 7:42 AM

## 2013-09-10 NOTE — Discharge Instructions (Signed)
°  DISCHARGE INSTRUCTIONS: HYSTEROSCOPY  The following instructions have been prepared to help you care for yourself upon your return home.  Personal hygiene:  Use sanitary pads for vaginal drainage, not tampons.  Shower the day after your procedure.  NO tub baths, pools or Jacuzzis for 2-3 weeks.  Wipe front to back after using the bathroom.  Activity and limitations:  Do NOT drive or operate any equipment for 24 hours. The effects of anesthesia are still present and drowsiness may result.  Do NOT rest in bed all day.  Walking is encouraged.  Walk up and down stairs slowly.  You may resume your normal activity in one to two days or as indicated by your physician. Sexual activity: NO intercourse for at least 2 weeks after the procedure, or as indicated by your Doctor.  Diet: Eat a light meal as desired this evening. You may resume your usual diet tomorrow.  Return to Work: You may resume your work activities in one to two days or as indicated by Marine scientist.  What to expect after your surgery: Expect to have vaginal bleeding/discharge for 2-3 days and spotting for up to 10 days. It is not unusual to have soreness for up to 1-2 weeks. You may have a slight burning sensation when you urinate for the first day. Mild cramps may continue for a couple of days. You may have a regular period in 2-6 weeks.  Call your doctor for any of the following:  Excessive vaginal bleeding or clotting, saturating and changing one pad every hour.  Inability to urinate 6 hours after discharge from hospital.  Pain not relieved by pain medication.  Fever of 100.4 F or greater.  Unusual vaginal discharge or odor.  Return to office _________________Call for an appointment ___________________ Patients signature: ______________________ Nurses signature ________________________  [place] [address] [phone]

## 2013-09-10 NOTE — Brief Op Note (Signed)
09/10/2013  7:43 AM  PATIENT:  Kathy Whitaker  80 y.o. female  PRE-OPERATIVE DIAGNOSIS:  Postmenopausal bleeding   POST-OPERATIVE DIAGNOSIS:  Postmenopausal bleeding   PROCEDURE:  Procedure(s): DILATATION & CURETTAGE/HYSTEROSCOPY WITH RESECTOCOPE (N/A)  SURGEON:  Surgeon(s) and Role:    * Darlyn Chamber, MD - Primary  PHYSICIAN ASSISTANT:   ASSISTANTS: none   ANESTHESIA:   general  EBL:  Total I/O In: 1000 [I.V.:1000] Out: -   BLOOD ADMINISTERED:none  DRAINS: none   LOCAL MEDICATIONS USED:  NONE  SPECIMEN:  Source of Specimen:  endometrial resections and currettings  DISPOSITION OF SPECIMEN:  PATHOLOGY  COUNTS:  YES  TOURNIQUET:  * No tourniquets in log *  DICTATION: .Other Dictation: Dictation Number [id number]  PLAN OF CARE: Discharge to home after PACU  PATIENT DISPOSITION:  PACU - hemodynamically stable.   Delay start of Pharmacological VTE agent (>24hrs) due to surgical blood loss or risk of bleeding: not applicable

## 2013-09-10 NOTE — Anesthesia Preprocedure Evaluation (Signed)
Anesthesia Evaluation  Patient identified by MRN, date of birth, ID band Patient awake    Reviewed: Allergy & Precautions, H&P , NPO status , Patient's Chart, lab work & pertinent test results  History of Anesthesia Complications Negative for: history of anesthetic complications  Airway Mallampati: II TM Distance: >3 FB Neck ROM: Full    Dental  (+) Partial Lower,    Pulmonary asthma , neg sleep apnea, COPD COPD inhaler, neg recent URI,  breath sounds clear to auscultation        Cardiovascular - angina- Past MI, - CHF and - DOE - dysrhythmias Rhythm:Regular     Neuro/Psych PSYCHIATRIC DISORDERS Depression negative neurological ROS     GI/Hepatic negative GI ROS, Neg liver ROS,   Endo/Other  neg diabetesHypothyroidism   Renal/GU negative Renal ROS     Musculoskeletal   Abdominal   Peds  Hematology negative hematology ROS (+)   Anesthesia Other Findings   Reproductive/Obstetrics                           Anesthesia Physical Anesthesia Plan  ASA: II  Anesthesia Plan: General   Post-op Pain Management:    Induction: Intravenous  Airway Management Planned: LMA  Additional Equipment: None  Intra-op Plan:   Post-operative Plan: Extubation in OR  Informed Consent: I have reviewed the patients History and Physical, chart, labs and discussed the procedure including the risks, benefits and alternatives for the proposed anesthesia with the patient or authorized representative who has indicated his/her understanding and acceptance.   Dental advisory given  Plan Discussed with: CRNA and Surgeon  Anesthesia Plan Comments:         Anesthesia Quick Evaluation

## 2013-09-10 NOTE — Transfer of Care (Signed)
Immediate Anesthesia Transfer of Care Note  Patient: Kathy Whitaker  Procedure(s) Performed: Procedure(s): DILATATION & CURETTAGE/HYSTEROSCOPY WITH RESECTOCOPE (N/A)  Patient Location: PACU  Anesthesia Type:General  Level of Consciousness: awake, alert  and oriented  Airway & Oxygen Therapy: Patient Spontanous Breathing and Patient connected to nasal cannula oxygen  Post-op Assessment: Report given to PACU RN, Post -op Vital signs reviewed and stable and Patient moving all extremities  Post vital signs: Reviewed and stable  Complications: No apparent anesthesia complications

## 2013-09-10 NOTE — Op Note (Signed)
NAMERHONDALYN, CLINGAN                  ACCOUNT NO.:  [id number]  MEDICAL RECORD NO.:  [id number]  LOCATION:  [facility]                          FACILITY:  [facility]  PHYSICIAN:  Darlyn Chamber, M.D.   DATE OF BIRTH:  May 03, 1933  DATE OF PROCEDURE:  09/10/2013 DATE OF DISCHARGE:                              OPERATIVE REPORT   PREOPERATIVE DIAGNOSIS:  Intrauterine fluid collection, rule out postmenopausal bleeding, endometrial pathology.  POSTOPERATIVE DIAGNOSIS:  Intrauterine fluid collection, rule out postmenopausal bleeding, endometrial pathology.  OPERATIVE PROCEDURE:  Cervical dilation, hysteroscopy with endometrial resection, and curettings.  SURGEON:  Darlyn Chamber, M.D.  ANESTHESIA:  General.  ESTIMATED BLOOD LOSS:  Minimal.  PACKS AND DRAINS:  None.  INTRAOPERATIVE BLOOD PLACED:  None.  COMPLICATIONS:  None.  INDICATIONS:  As dictated in history and physical.  PROCEDURE IN DETAIL:  The patient was taken to the OR and placed supine position.  After satisfactory level of general anesthesia was obtained, she was placed in dorsal lithotomy position using the Allen stirrups. The perineum and vagina were prepped out with Betadine.  Bladder was emptied by in-and-out catheterization.  The patient was draped as sterile field.  A speculum was placed in vaginal vault.  The cervix was grasped with single-tooth tenaculum.  Uterus sounded to approximately 8 cm.  We did not get any fluid come out when we sounded.  The cervix was stenotic.  We serially dilated to a size 31 Pratt dilator.  Operative hysteroscope was introduced.  Intrauterine cavity was distended using glycine.  Visualization showed a very smooth and atrophic endometrium. She had a couple areas anteriorly that has some slight fullness and increased vascularity.  Biopsies were obtained from the anterior and posterior wall and sent for pathological review.  There was no excessive bleeding.  Total deficit was 90 mL.  At this  point in time, the scope was then removed.  Endometrial curettings were obtained, also sent for Pathology.  Single-tooth tenaculum and speculum were then removed.  The patient was taken out of the dorsal lithotomy position. Once alert, extubated, was transferred to recovery room in good condition.  Sponge, instrument, and needle count were reported correct by circulating nurse x2.     Darlyn Chamber, M.D.     JSM/MEDQ  D:  09/10/2013  T:  09/10/2013  Job:  [id number]

## 2013-09-10 NOTE — H&P (Signed)
  History and physical exam unchanged 

## 2013-09-13 ENCOUNTER — Encounter (HOSPITAL_COMMUNITY): Payer: Self-pay | Admitting: Obstetrics and Gynecology

## 2013-09-20 ENCOUNTER — Ambulatory Visit: Payer: Medicare Other

## 2013-09-28 ENCOUNTER — Ambulatory Visit (INDEPENDENT_AMBULATORY_CARE_PROVIDER_SITE_OTHER): Payer: Medicare Other

## 2013-09-28 DIAGNOSIS — J45909 Unspecified asthma, uncomplicated: Secondary | ICD-10-CM

## 2013-09-29 MED ORDER — OMALIZUMAB 150 MG ~~LOC~~ SOLR
150.0000 mg | Freq: Once | SUBCUTANEOUS | Status: AC
  Administered 2013-09-29: 150 mg via SUBCUTANEOUS

## 2013-10-22 DIAGNOSIS — J45909 Unspecified asthma, uncomplicated: Secondary | ICD-10-CM

## 2013-10-28 ENCOUNTER — Ambulatory Visit: Payer: Medicare Other

## 2013-11-01 ENCOUNTER — Ambulatory Visit (INDEPENDENT_AMBULATORY_CARE_PROVIDER_SITE_OTHER): Payer: Medicare Other

## 2013-11-01 DIAGNOSIS — J45909 Unspecified asthma, uncomplicated: Secondary | ICD-10-CM

## 2013-11-03 MED ORDER — OMALIZUMAB 150 MG ~~LOC~~ SOLR
150.0000 mg | Freq: Once | SUBCUTANEOUS | Status: AC
  Administered 2013-11-03: 150 mg via SUBCUTANEOUS

## 2013-11-12 ENCOUNTER — Other Ambulatory Visit: Payer: Self-pay | Admitting: Internal Medicine

## 2013-11-12 DIAGNOSIS — N390 Urinary tract infection, site not specified: Secondary | ICD-10-CM | POA: Diagnosis not present

## 2013-11-16 ENCOUNTER — Telehealth: Payer: Self-pay | Admitting: Critical Care Medicine

## 2013-11-16 MED ORDER — BUDESONIDE 0.25 MG/2ML IN SUSP: 0.2500 mg | Freq: Every day | RESPIRATORY_TRACT | Status: DC

## 2013-11-16 NOTE — Telephone Encounter (Signed)
Appt set for 12-06-13. Refills called into [facility]. Pt states she does not need perforomist, only pulmicort. Refill done. [place] Bing, CMA

## 2013-11-22 ENCOUNTER — Encounter: Payer: Self-pay | Admitting: Internal Medicine

## 2013-11-22 ENCOUNTER — Other Ambulatory Visit: Payer: Self-pay | Admitting: *Deleted

## 2013-11-22 ENCOUNTER — Ambulatory Visit: Payer: Medicare Other | Admitting: Internal Medicine

## 2013-11-22 VITALS — BP 116/76 | HR 60 | Temp 97.7°F | Resp 16 | Ht 60.0 in | Wt 142.0 lb

## 2013-11-22 DIAGNOSIS — I10 Essential (primary) hypertension: Secondary | ICD-10-CM

## 2013-11-22 DIAGNOSIS — E559 Vitamin D deficiency, unspecified: Secondary | ICD-10-CM

## 2013-11-22 DIAGNOSIS — E785 Hyperlipidemia, unspecified: Secondary | ICD-10-CM | POA: Diagnosis not present

## 2013-11-22 DIAGNOSIS — R6889 Other general symptoms and signs: Secondary | ICD-10-CM | POA: Diagnosis not present

## 2013-11-22 DIAGNOSIS — Z23 Encounter for immunization: Secondary | ICD-10-CM

## 2013-11-22 DIAGNOSIS — R7309 Other abnormal glucose: Secondary | ICD-10-CM | POA: Diagnosis not present

## 2013-11-22 DIAGNOSIS — Z9181 History of falling: Secondary | ICD-10-CM

## 2013-11-22 DIAGNOSIS — Z1212 Encounter for screening for malignant neoplasm of rectum: Secondary | ICD-10-CM

## 2013-11-22 DIAGNOSIS — Z0001 Encounter for general adult medical examination with abnormal findings: Secondary | ICD-10-CM

## 2013-11-22 DIAGNOSIS — K219 Gastro-esophageal reflux disease without esophagitis: Secondary | ICD-10-CM

## 2013-11-22 DIAGNOSIS — Z1331 Encounter for screening for depression: Secondary | ICD-10-CM

## 2013-11-22 DIAGNOSIS — Z79899 Other long term (current) drug therapy: Secondary | ICD-10-CM

## 2013-11-22 DIAGNOSIS — E039 Hypothyroidism, unspecified: Secondary | ICD-10-CM

## 2013-11-22 DIAGNOSIS — R7303 Prediabetes: Secondary | ICD-10-CM

## 2013-11-22 DIAGNOSIS — J4530 Mild persistent asthma, uncomplicated: Secondary | ICD-10-CM

## 2013-11-22 LAB — CBC WITH DIFFERENTIAL/PLATELET
BASOS ABS: 0.1 10*3/uL (ref 0.0–0.1)
Basophils Relative: 1 % (ref 0–1)
Eosinophils Absolute: 0.1 10*3/uL (ref 0.0–0.7)
Eosinophils Relative: 2 % (ref 0–5)
HEMATOCRIT: 39.8 % (ref 36.0–46.0)
Hemoglobin: 13.1 g/dL (ref 12.0–15.0)
LYMPHS ABS: 1.7 10*3/uL (ref 0.7–4.0)
LYMPHS PCT: 27 % (ref 12–46)
MCH: 29.3 pg (ref 26.0–34.0)
MCHC: 32.9 g/dL (ref 30.0–36.0)
MCV: 89 fL (ref 78.0–100.0)
MONO ABS: 0.6 10*3/uL (ref 0.1–1.0)
Monocytes Relative: 9 % (ref 3–12)
Neutro Abs: 3.8 10*3/uL (ref 1.7–7.7)
Neutrophils Relative %: 61 % (ref 43–77)
PLATELETS: 310 10*3/uL (ref 150–400)
RBC: 4.47 MIL/uL (ref 3.87–5.11)
RDW: 14 % (ref 11.5–15.5)
WBC: 6.3 10*3/uL (ref 4.0–10.5)

## 2013-11-22 MED ORDER — SERTRALINE HCL 50 MG PO TABS: 50.0000 mg | ORAL_TABLET | Freq: Every day | ORAL | Status: DC

## 2013-11-22 MED ORDER — SIMVASTATIN 40 MG PO TABS: 40.0000 mg | ORAL_TABLET | Freq: Every day | ORAL | Status: DC

## 2013-11-22 NOTE — Patient Instructions (Signed)
Recommend the book "The END of DIETING" by Dr Baker Janus   and the book "The END of DIABETES " by Dr Excell Seltzer  At [email or web address] - get book & Audio CD's      Being diabetic has a  300% increased risk for heart attack, stroke, cancer, and alzheimer- type vascular dementia. It is very important that you work harder with diet by avoiding all foods that are white except chicken & fish. Avoid white rice (brown & wild rice is OK), white potatoes (sweetpotatoes in moderation is OK), White bread or wheat bread or anything made out of white flour like bagels, donuts, rolls, buns, biscuits, cakes, pastries, cookies, pizza crust, and pasta (made from white flour & egg whites) - vegetarian pasta or spinach or wheat pasta is OK. Multigrain breads like Arnold's or [employer], or multigrain sandwich thins or flatbreads.  Diet, exercise and weight loss can reverse and cure diabetes in the early stages.  Diet, exercise and weight loss is very important in the control and prevention of complications of diabetes which affects every system in your body, ie. Brain - dementia/stroke, eyes - glaucoma/blindness, heart - heart attack/heart failure, kidneys - dialysis, stomach - gastric paralysis, intestines - malabsorption, nerves - severe painful neuritis, circulation - gangrene & loss of a leg(s), and finally cancer and Alzheimers.    I recommend avoid fried & greasy foods,  sweets/candy, white rice (brown or wild rice or Quinoa is OK), white potatoes (sweet potatoes are OK) - anything made from white flour - bagels, doughnuts, rolls, buns, biscuits,white and wheat breads, pizza crust and traditional pasta made of white flour & egg white(vegetarian pasta or spinach or wheat pasta is OK).  Multi-grain bread is OK - like multi-grain flat bread or sandwich thins. Avoid alcohol in excess. Exercise is also important.    Eat all the vegetables you want - avoid meat, especially red meat and dairy - especially cheese.  Cheese  is the most concentrated form of trans-fats which is the worst thing to clog up our arteries. Veggie cheese is OK which can be found in the fresh produce section at [employer] or [employer] or [employer]  Preventive Care for Adults A healthy lifestyle and preventive care can promote health and wellness. Preventive health guidelines for women include the following key practices.  A routine yearly physical is a good way to check with your health care provider about your health and preventive screening. It is a chance to share any concerns and updates on your health and to receive a thorough exam.  Visit your dentist for a routine exam and preventive care every 6 months. Brush your teeth twice a day and floss once a day. Good oral hygiene prevents tooth decay and gum disease.  The frequency of eye exams is based on your age, health, family medical history, use of contact lenses, and other factors. Follow your health care provider's recommendations for frequency of eye exams.  Eat a healthy diet. Foods like vegetables, fruits, whole grains, low-fat dairy products, and lean protein foods contain the nutrients you need without too many calories. Decrease your intake of foods high in solid fats, added sugars, and salt. Eat the right amount of calories for you.Get information about a proper diet from your health care provider, if necessary.  Regular physical exercise is one of the most important things you can do for your health. Most adults should get at least 150 minutes of moderate-intensity exercise (any activity that increases  your heart rate and causes you to sweat) each week. In addition, most adults need muscle-strengthening exercises on 2 or more days a week.  Maintain a healthy weight. The body mass index (BMI) is a screening tool to identify possible weight problems. It provides an estimate of body fat based on height and weight. Your health care provider can find your BMI and can help you  achieve or maintain a healthy weight.For adults 20 years and older:  A BMI below 18.5 is considered underweight.  A BMI of 18.5 to 24.9 is normal.  A BMI of 25 to 29.9 is considered overweight.  A BMI of 30 and above is considered obese.  Maintain normal blood lipids and cholesterol levels by exercising and minimizing your intake of saturated fat. Eat a balanced diet with plenty of fruit and vegetables. If your lipid or cholesterol levels are high, you are over 50, or you are at high risk for heart disease, you may need your cholesterol levels checked more frequently.Ongoing high lipid and cholesterol levels should be treated with medicines if diet and exercise are not working.  If you smoke, find out from your health care provider how to quit. If you do not use tobacco, do not start.  Lung cancer screening is recommended for adults aged 55-80 years who are at high risk for developing lung cancer because of a history of smoking. A yearly low-dose CT scan of the lungs is recommended for people who have at least a 30-pack-year history of smoking and are a current smoker or have quit within the past 15 years. A pack year of smoking is smoking an average of 1 pack of cigarettes a day for 1 year (for example: 1 pack a day for 30 years or 2 packs a day for 15 years). Yearly screening should continue until the smoker has stopped smoking for at least 15 years. Yearly screening should be stopped for people who develop a health problem that would prevent them from having lung cancer treatment.  Avoid use of street drugs. Do not share needles with anyone. Ask for help if you need support or instructions about stopping the use of drugs.  High blood pressure causes heart disease and increases the risk of stroke.  Ongoing high blood pressure should be treated with medicines if weight loss and exercise do not work.  If you are 55-79 years old, ask your health care provider if you should take aspirin to  prevent strokes.  Diabetes screening involves taking a blood sample to check your fasting blood sugar level. This should be done once every 3 years, after age 45, if you are within normal weight and without risk factors for diabetes. Testing should be considered at a younger age or be carried out more frequently if you are overweight and have at least 1 risk factor for diabetes.  Breast cancer screening is essential preventive care for women. You should practice "breast self-awareness." This means understanding the normal appearance and feel of your breasts and may include breast self-examination. Any changes detected, no matter how small, should be reported to a health care provider. Women in their 20s and 30s should have a clinical breast exam (CBE) by a health care provider as part of a regular health exam every 1 to 3 years. After age 40, women should have a CBE every year. Starting at age 40, women should consider having a mammogram (breast X-ray test) every year. Women who have a family history of breast cancer should   talk to their health care provider about genetic screening. Women at a high risk of breast cancer should talk to their health care providers about having an MRI and a mammogram every year.  Breast cancer gene (BRCA)-related cancer risk assessment is recommended for women who have family members with BRCA-related cancers. BRCA-related cancers include breast, ovarian, tubal, and peritoneal cancers. Having family members with these cancers may be associated with an increased risk for harmful changes (mutations) in the breast cancer genes BRCA1 and BRCA2. Results of the assessment will determine the need for genetic counseling and BRCA1 and BRCA2 testing.  Routine pelvic exams to screen for cancer are no longer recommended for nonpregnant women who are considered low risk for cancer of the pelvic organs (ovaries, uterus, and vagina) and who do not have symptoms. Ask your health care provider  if a screening pelvic exam is right for you.  If you have had past treatment for cervical cancer or a condition that could lead to cancer, you need Pap tests and screening for cancer for at least 20 years after your treatment. If Pap tests have been discontinued, your risk factors (such as having a new sexual partner) need to be reassessed to determine if screening should be resumed. Some women have medical problems that increase the chance of getting cervical cancer. In these cases, your health care provider may recommend more frequent screening and Pap tests.    Colorectal cancer can be detected and often prevented. Most routine colorectal cancer screening begins at the age of 90 years and continues through age 8 years. However, your health care provider may recommend screening at an earlier age if you have risk factors for colon cancer. On a yearly basis, your health care provider may provide home test kits to check for hidden blood in the stool. Use of a small camera at the end of a tube, to directly examine the colon (sigmoidoscopy or colonoscopy), can detect the earliest forms of colorectal cancer. Talk to your health care provider about this at age 86, when routine screening begins. Direct exam of the colon should be repeated every 5-10 years through age 32 years, unless early forms of pre-cancerous polyps or small growths are found.  Osteoporosis is a disease in which the bones lose minerals and strength with aging. This can result in serious bone fractures or breaks. The risk of osteoporosis can be identified using a bone density scan. Women ages 75 years and over and women at risk for fractures or osteoporosis should discuss screening with their health care providers. Ask your health care provider whether you should take a calcium supplement or vitamin D to reduce the rate of osteoporosis.  Menopause can be associated with physical symptoms and risks. Hormone replacement therapy is available to  decrease symptoms and risks. You should talk to your health care provider about whether hormone replacement therapy is right for you.  Use sunscreen. Apply sunscreen liberally and repeatedly throughout the day. You should seek shade when your shadow is shorter than you. Protect yourself by wearing long sleeves, pants, a wide-brimmed hat, and sunglasses year round, whenever you are outdoors.  Once a month, do a whole body skin exam, using a mirror to look at the skin on your back. Tell your health care provider of new moles, moles that have irregular borders, moles that are larger than a pencil eraser, or moles that have changed in shape or color.  Stay current with required vaccines (immunizations).  Influenza vaccine. All adults  should be immunized every year.  Tetanus, diphtheria, and acellular pertussis (Td, Tdap) vaccine. Pregnant women should receive 1 dose of Tdap vaccine during each pregnancy. The dose should be obtained regardless of the length of time since the last dose. Immunization is preferred during the 27th-36th week of gestation. An adult who has not previously received Tdap or who does not know her vaccine status should receive 1 dose of Tdap. This initial dose should be followed by tetanus and diphtheria toxoids (Td) booster doses every 10 years. Adults with an unknown or incomplete history of completing a 3-dose immunization series with Td-containing vaccines should begin or complete a primary immunization series including a Tdap dose. Adults should receive a Td booster every 10 years.    Zoster vaccine. One dose is recommended for adults aged 38 years or older unless certain conditions are present.    Pneumococcal 13-valent conjugate (PCV13) vaccine. When indicated, a person who is uncertain of her immunization history and has no record of immunization should receive the PCV13 vaccine. An adult aged 94 years or older who has certain medical conditions and has not been previously  immunized should receive 1 dose of PCV13 vaccine. This PCV13 should be followed with a dose of pneumococcal polysaccharide (PPSV23) vaccine. The PPSV23 vaccine dose should be obtained at least 8 weeks after the dose of PCV13 vaccine. An adult aged 42 years or older who has certain medical conditions and previously received 1 or more doses of PPSV23 vaccine should receive 1 dose of PCV13. The PCV13 vaccine dose should be obtained 1 or more years after the last PPSV23 vaccine dose.    Pneumococcal polysaccharide (PPSV23) vaccine. When PCV13 is also indicated, PCV13 should be obtained first. All adults aged 55 years and older should be immunized. An adult younger than age 50 years who has certain medical conditions should be immunized. Any person who resides in a nursing home or long-term care facility should be immunized. An adult smoker should be immunized. People with an immunocompromised condition and certain other conditions should receive both PCV13 and PPSV23 vaccines. People with human immunodeficiency virus (HIV) infection should be immunized as soon as possible after diagnosis. Immunization during chemotherapy or radiation therapy should be avoided. Routine use of PPSV23 vaccine is not recommended for American Indians, [place], or people younger than 65 years unless there are medical conditions that require PPSV23 vaccine. When indicated, people who have unknown immunization and have no record of immunization should receive PPSV23 vaccine. One-time revaccination 5 years after the first dose of PPSV23 is recommended for people aged 19-64 years who have chronic kidney failure, nephrotic syndrome, asplenia, or immunocompromised conditions. People who received 1-2 doses of PPSV23 before age 19 years should receive another dose of PPSV23 vaccine at age 22 years or later if at least 5 years have passed since the previous dose. Doses of PPSV23 are not needed for people immunized with PPSV23 at or after  age 15 years.   Preventive Services / Frequency  Ages 21 years and over  Blood pressure check.** / Every 1 to 2 years.  Lipid and cholesterol check.** / Every 5 years beginning at age 74 years.  Lung cancer screening. / Every year if you are aged 44-80 years and have a 30-pack-year history of smoking and currently smoke or have quit within the past 15 years. Yearly screening is stopped once you have quit smoking for at least 15 years or develop a health problem that would prevent you from having  lung cancer treatment.  Clinical breast exam.** / Every year after age 40 years.  BRCA-related cancer risk assessment.** / For women who have family members with a BRCA-related cancer (breast, ovarian, tubal, or peritoneal cancers).  Mammogram.** / Every year beginning at age 40 years and continuing for as long as you are in good health. Consult with your health care provider.  Pap test.** / Every 3 years starting at age 30 years through age 65 or 70 years with 3 consecutive normal Pap tests. Testing can be stopped between 65 and 70 years with 3 consecutive normal Pap tests and no abnormal Pap or HPV tests in the past 10 years.  Fecal occult blood test (FOBT) of stool. / Every year beginning at age 50 years and continuing until age 75 years. You may not need to do this test if you get a colonoscopy every 10 years.  Flexible sigmoidoscopy or colonoscopy.** / Every 5 years for a flexible sigmoidoscopy or every 10 years for a colonoscopy beginning at age 50 years and continuing until age 75 years.  Hepatitis C blood test.** / For all people born from 1945 through 1965 and any individual with known risks for hepatitis C.  Osteoporosis screening.** / A one-time screening for women ages 65 years and over and women at risk for fractures or osteoporosis.  Skin self-exam. / Monthly.  Influenza vaccine. / Every year.  Tetanus, diphtheria, and acellular pertussis (Tdap/Td) vaccine.** / 1 dose of Td  every 10 years.  Zoster vaccine.** / 1 dose for adults aged 60 years or older.  Pneumococcal 13-valent conjugate (PCV13) vaccine.** / Consult your health care provider.  Pneumococcal polysaccharide (PPSV23) vaccine.** / 1 dose for all adults aged 65 years and older.  

## 2013-11-22 NOTE — Progress Notes (Signed)
Patient ID: Chauncey Bruno, female   DOB: 26-Feb-1933, 80 y.o.   MRN: [id number]  [facility] VISIT AND CPE  Assessment:   1. Hypertension  - Microalbumin / creatinine urine ratio - EKG 12-Lead - [place], RETROPERITNL ABD,  LTD  2. Hyperlipemia  - Lipid panel  3. Prediabetes  - Hemoglobin A1c - Insulin, fasting  4. Vitamin D deficiency  - Vit D  25 hydroxy   5. Medication management  - Urine Microscopic - CBC with Differential - BASIC METABOLIC PANEL WITH GFR - Hepatic function panel - Magnesium  6. Hypothyroidism  - TSH  7. Extrinsic asthma   8. Gastroesophageal reflux disease   9. Screening for rectal cancer  - POC Hemoccult Bld/Stl (3-Cd Home Screen); Future  10. Encounter for general adult medical examination with abnormal findings   11. At low risk for fall   12. Depression screen  - Negative  13. Need for prophylactic vaccination and inoculation against influenza  - Flu vaccine HIGH DOSE PF (Fluzone Tri High dose)  14. Need for prophylactic vaccination against Streptococcus pneumoniae (pneumococcus)  - Pneumococcal conjugate vaccine 13-valent , then repeat Pneumovax (23 - valent) in 1 year  Plan:   During the course of the visit the patient was educated and counseled about appropriate screening and preventive services including:    Pneumococcal vaccine   Influenza vaccine  Td vaccine  Screening electrocardiogram  Bone densitometry screening  Colorectal cancer screening  Diabetes screening  Glaucoma screening  Nutrition counseling   Advanced directives: requested  Screening recommendations, referrals: Vaccinations: DT vaccine 10/09/2004 Influenza vaccine HD 11/22/2013 Pneumococcal vaccine 09/14/2008 & booster deferred 1 yr after Prevnar (today) Prevnar vaccine 11/22/2013 Shingles vaccine 12/2012 Hep B vaccine not indicated  Nutrition assessed and recommended  Colonoscopy 10/2005 Recommended yearly  ophthalmology/optometry visit for glaucoma screening and checkup Recommended yearly dental visit for hygiene and checkup Advanced directives - Yes  Conditions/risks identified: BMI: Discussed weight loss, diet, and increase physical activity.  Increase physical activity: AHA recommends 150 minutes of physical activity a week.  Medications reviewed PreDiabetes is near goal, ACE/ARB therapy: Not indicated. Urinary Incontinence is not an issue: discussed non pharmacology and pharmacology options.  Fall risk: low- discussed PT, home fall assessment, medications.   Subjective:   Madesyn Ast is a 80 y.o. WWF who presents for Medicare Annual Wellness Visit and complete physical.  Date of last medicare wellness visit is unknown.  She has had elevated blood pressure since 2007 and has been monitored expectantly. [name] Her blood pressure has been controlled at home, & today their BP is BP: 116/76 mmHg She does not workout. She denies chest pain, shortness of breath, dizziness.  She is on cholesterol medication and denies myalgias. Her cholesterol is at goal. The cholesterol last visit was:    Lab Results  Component Value Date   CHOL 184 05/21/2013   HDL 65 05/21/2013   LDLCALC 103* 05/21/2013   TRIG 81 05/21/2013   CHOLHDL 2.8 05/21/2013   She has had prediabetes for 4 years (2011 with A1c 6.0%). She has been working on diet and exercise for prediabetes, and denies foot ulcerations, hyperglycemia, paresthesia of the feet, polydipsia, polyuria and visual disturbances. Last A1C in the office was:  Lab Results  Component Value Date   HGBA1C 5.6 05/21/2013   Patient is on Vitamin D supplement.   Lab Results  Component Value Date   VD25OH 53 05/21/2013      Names of Other Physician/Practitioners you currently  use: 1. [facility] here for primary care 2. Dr Thom Chimes (OD), eye doctor, last visit 2014 3. Dr Ignatius Specking, dentist, last visit 2015  Patient Care  Team: [name], MD as PCP - General (Internal Medicine) Elsie Stain, MD (Pulmonary Disease) Darlyn Chamber, MD as Consulting Physician (Obstetrics and Gynecology)  Medication Review: Medication Sig  . albuterol-ipratropium (COMBIVENT) 18-103 MCG/ACT inhaler Inhale 2 puffs into the lungs every 6 (six) hours as needed for wheezing.  . budesonide (PULMICORT) 0.25 MG/2ML nebulizer solution Take 2 mLs (0.25 mg total) by nebulization daily. Once daily and will take again if needed  . calcium-vitamin D (CALCIUM 500/D) 500-200 MG-UNIT per tablet Take 1 tablet by mouth daily.    . Cholecalciferol (VITAMIN D PO) Take 2,000 Units by mouth.  . cholecalciferol (VITAMIN D) 1000 UNITS tablet Take 2,000 Units by mouth daily.  [name] EPINEPHrine (EPIPEN JR) 0.15 MG/0.3ML injection Inject 0.15 mg into the muscle as needed.    [name] HYDROmorphone (DILAUDID) 2 MG tablet Take 1 tablet (2 mg total) by mouth every 4 (four) hours as needed for severe pain.  [name] levothyroxine (SYNTHROID, LEVOTHROID) 75 MCG tablet TAKE 1 TABLET DAILY  . loratadine (CLARITIN) 10 MG tablet Take 10 mg by mouth daily.    [name] omalizumab (XOLAIR) 150 MG injection Inject 150 mg into the skin every 28 (twenty-eight) days.    [name] sertraline (ZOLOFT) 50 MG tablet Take 50 mg by mouth daily.    . simvastatin (ZOCOR) 40 MG tablet Take 40 mg by mouth at bedtime.    . zoster vaccine live, PF, (ZOSTAVAX) 82423 UNT/0.65ML injection Inject 19,400 Units into the skin once.     Current Problems (verified) Patient Active Problem List   Diagnosis Date Noted  . Postmenopausal bleeding 09/10/2013    Class: Hospitalized for  . Essential hypertension 05/21/2013  . Medication management 05/21/2013  . Prediabetes   . Vitamin D deficiency   . Hypothyroidism   . Hyperlipemia   . ALLERGY, FOOD 04/05/2008  . URTICARIA 03/04/2008  . Extrinsic asthma 05/07/2007  . GERD 11/12/2006    Screening Tests Health Maintenance  Topic Date Due  . Tetanus/tdap   10/31/1954  . Zostavax  10/31/1995  . Influenza Vaccine  09/04/2013  . Colonoscopy  10/01/2015  . Pneumococcal Polysaccharide Vaccine Age 71 And Over  Completed    Immunization History  Administered Date(s) Administered  . Influenza Whole 10/05/2009, 11/26/2010, 11/05/2011  . Influenza, High Dose Seasonal PF 11/20/2012, 11/22/2013  . Pneumococcal Conjugate-13 11/22/2013  . Pneumococcal Polysaccharide-23 09/14/2008  . Td 10/09/2004  . Zoster 02/15/2013    Preventative care: Last colonoscopy: 10/2005   Prior vaccinations: TD : 10/09/2004  Influenza: HD 11/22/2013  Pneumococcal: 09/14/2008 Prevnar: 11/22/2013 Shingles/Zostavax: 12/2012  History reviewed: allergies, current medications, past family history, past medical history, past social history, past surgical history and problem list   Risk Factors: Tobacco History  Substance Use Topics  . Smoking status: Never Smoker   . Smokeless tobacco: Never Used  . Alcohol Use: Yes     Comment: Wine daily   She does not smoke.  Patient is not a former smoker. Are there smokers in your home (other than you)?  No  Alcohol Current alcohol use: glass of wine with dinner  Caffeine Current caffeine use: coffee 1-2 /day  Exercise Current exercise: walking  Nutrition/Diet Current diet: in general, a "healthy" diet    Cardiac risk factors: advanced age (older than 30 for men, 53  for women), dyslipidemia and sedentary lifestyle.  Depression Screen (Note: if answer to either of the following is "Yes", a more complete depression screening is indicated)   Q1: Over the past two weeks, have you felt down, depressed or hopeless? No  Q2: Over the past two weeks, have you felt little interest or pleasure in doing things? No  Have you lost interest or pleasure in daily life? No  Do you often feel hopeless? No  Do you cry easily over simple problems? No  Activities of Daily Living In your present state of health, do you have any  difficulty performing the following activities?:  Driving? No Managing money?  No Feeding yourself? No Getting from bed to chair? No Climbing a flight of stairs? No Preparing food and eating?: No Bathing or showering? No Getting dressed: No Getting to the toilet? No Using the toilet:No Moving around from place to place: No In the past year have you fallen or had a near fall?:No   Are you sexually active?  Yes  Do you have more than one partner?  No  Vision Difficulties: No  Hearing Difficulties: No Do you often ask people to speak up or repeat themselves? No Do you experience ringing or noises in your ears? No Do you have difficulty understanding soft or whispered voices? Sometimes.  Cognition  Do you feel that you have a problem with memory?No  Do you often misplace items? No  Do you feel safe at home?  Yes  Advanced directives Does patient have a [place]? Yes Does patient have a Living Will? Yes   Objective:     Blood pressure 116/76, pulse 60, temperature 97.7 F (36.5 C), temperature source Temporal, resp. rate 16, height 5' (1.524 m), weight 142 lb (64.411 kg). Body mass index is 27.73 kg/(m^2).  General appearance: alert, no distress, WD/WN, female Cognitive Testing  Alert? Yes  Normal Appearance?Yes  Oriented to person? Yes  Place? Yes   Time? Yes  Recall of three objects?  Yes  Can perform simple calculations? Yes  Displays appropriate judgment? Yes  Can read the correct time from a watch/clock?Yes  HEENT: normocephalic, sclerae anicteric, TMs pearly, nares patent, no discharge or erythema, pharynx normal Oral cavity: MMM, no lesions Neck: supple, no lymphadenopathy, no thyromegaly, no masses Heart: RRR, normal S1, S2, no murmurs Lungs: CTA bilaterally, no wheezes, rhonchi, or rales Abdomen: +bs, soft, non tender, non distended, no masses, no hepatomegaly, no splenomegaly Musculoskeletal: nontender, no swelling, no obvious  deformity Extremities: no edema, no cyanosis, no clubbing Pulses: 2+ symmetric, upper and lower extremities, normal cap refill Neurological: alert, oriented x 3, CN2-12 intact, strength normal upper extremities and lower extremities, sensation normal throughout, DTRs 2+ throughout, no cerebellar signs, gait normal Psychiatric: normal affect, behavior normal, pleasant   Medicare Attestation I have personally reviewed: The patient's medical and social history Their use of alcohol, tobacco or illicit drugs Their current medications and supplements The patient's functional ability including ADLs,fall risks, home safety risks, cognitive, and hearing and visual impairment Diet and physical activities Evidence for depression or mood disorders  The patient's weight, height, BMI, and visual acuity have been recorded in the chart.  I have made referrals, counseling, and provided education to the patient based on review of the above and I have provided the patient with a written personalized care plan for preventive services.    Josuel Koeppen DAVID, MD   11/22/2013

## 2013-11-23 LAB — BASIC METABOLIC PANEL WITH GFR
BUN: 17 mg/dL (ref 6–23)
CHLORIDE: 102 meq/L (ref 96–112)
CO2: 30 mEq/L (ref 19–32)
Calcium: 9.7 mg/dL (ref 8.4–10.5)
Creat: 0.87 mg/dL (ref 0.50–1.10)
GFR, EST NON AFRICAN AMERICAN: 64 mL/min
GFR, Est African American: 74 mL/min
Glucose, Bld: 83 mg/dL (ref 70–99)
Potassium: 4.5 mEq/L (ref 3.5–5.3)
Sodium: 142 mEq/L (ref 135–145)

## 2013-11-23 LAB — HEPATIC FUNCTION PANEL
ALK PHOS: 76 U/L (ref 39–117)
ALT: 11 U/L (ref 0–35)
AST: 18 U/L (ref 0–37)
Albumin: 4 g/dL (ref 3.5–5.2)
BILIRUBIN INDIRECT: 0.4 mg/dL (ref 0.2–1.2)
BILIRUBIN TOTAL: 0.5 mg/dL (ref 0.2–1.2)
Bilirubin, Direct: 0.1 mg/dL (ref 0.0–0.3)
Total Protein: 6.6 g/dL (ref 6.0–8.3)

## 2013-11-23 LAB — HEMOGLOBIN A1C
Hgb A1c MFr Bld: 5.8 % — ABNORMAL HIGH (ref <5.7)
Mean Plasma Glucose: 120 mg/dL — ABNORMAL HIGH (ref <117)

## 2013-11-23 LAB — URINALYSIS, MICROSCOPIC ONLY
Bacteria, UA: NONE SEEN
CASTS: NONE SEEN
CRYSTALS: NONE SEEN
Squamous Epithelial / LPF: NONE SEEN

## 2013-11-23 LAB — TSH: TSH: 1.519 u[IU]/mL (ref 0.350–4.500)

## 2013-11-23 LAB — INSULIN, FASTING: Insulin fasting, serum: 8.6 u[IU]/mL (ref 2.0–19.6)

## 2013-11-23 LAB — LIPID PANEL
CHOL/HDL RATIO: 2.6 ratio
Cholesterol: 161 mg/dL (ref 0–200)
HDL: 63 mg/dL (ref >39)
LDL Cholesterol: 72 mg/dL (ref 0–99)
TRIGLYCERIDES: 130 mg/dL (ref <150)
VLDL: 26 mg/dL (ref 0–40)

## 2013-11-23 LAB — MICROALBUMIN / CREATININE URINE RATIO
Creatinine, Urine: 135 mg/dL
MICROALB/CREAT RATIO: 5.2 mg/g (ref 0.0–30.0)
Microalb, Ur: 0.7 mg/dL (ref <2.0)

## 2013-11-23 LAB — MAGNESIUM: Magnesium: 2 mg/dL (ref 1.5–2.5)

## 2013-11-23 LAB — VITAMIN D 25 HYDROXY (VIT D DEFICIENCY, FRACTURES): Vit D, 25-Hydroxy: 63 ng/mL (ref 30–89)

## 2013-12-03 ENCOUNTER — Ambulatory Visit (INDEPENDENT_AMBULATORY_CARE_PROVIDER_SITE_OTHER): Payer: Medicare Other

## 2013-12-03 DIAGNOSIS — J4540 Moderate persistent asthma, uncomplicated: Secondary | ICD-10-CM | POA: Diagnosis not present

## 2013-12-06 ENCOUNTER — Encounter: Payer: Self-pay | Admitting: Critical Care Medicine

## 2013-12-06 ENCOUNTER — Ambulatory Visit (INDEPENDENT_AMBULATORY_CARE_PROVIDER_SITE_OTHER): Payer: Medicare Other | Admitting: Critical Care Medicine

## 2013-12-06 VITALS — BP 122/72 | HR 66 | Temp 98.3°F | Ht 60.0 in | Wt 142.8 lb

## 2013-12-06 DIAGNOSIS — J4540 Moderate persistent asthma, uncomplicated: Secondary | ICD-10-CM

## 2013-12-06 MED ORDER — IPRATROPIUM-ALBUTEROL 18-103 MCG/ACT IN AERO: 2.0000 | INHALATION_SPRAY | Freq: Four times a day (QID) | RESPIRATORY_TRACT | Status: DC | PRN

## 2013-12-06 MED ORDER — BUDESONIDE 0.25 MG/2ML IN SUSP
0.2500 mg | Freq: Every day | RESPIRATORY_TRACT | Status: DC
Start: 2013-12-06 — End: 2019-04-22

## 2013-12-06 NOTE — Assessment & Plan Note (Signed)
Moderate persistent asthma  Improved with xolair Rx Plan Cont inhaled meds Cont xolair

## 2013-12-06 NOTE — Patient Instructions (Signed)
No change in medications. Return in          1 year 

## 2013-12-06 NOTE — Progress Notes (Signed)
Subjective:    Patient ID: Kathy Whitaker, female    DOB: 1933/08/17, 80 y.o.   MRN: [id number]  HPI 12/06/2013 Chief Complaint  Patient presents with  . Yearly Follow up    Breathing doing well overall.  Has occas DOE and wheezing but not often and occas nonprod cough.   Overall doing well. No new issues. Pt denies any significant sore throat, nasal congestion or excess secretions, fever, chills, sweats, unintended weight loss, pleurtic or exertional chest pain, orthopnea PND, or leg swelling Pt denies any increase in rescue therapy over baseline, denies waking up needing it or having any early am or nocturnal exacerbations of coughing/wheezing/or dyspnea. Pt also denies any obvious fluctuation in symptoms with  weather or environmental change or other alleviating or aggravating factors  Review of Systems Constitutional:   No  weight loss, night sweats,  Fevers, chills, fatigue, lassitude. HEENT:   No headaches,  Difficulty swallowing,  Tooth/dental problems,  Sore throat,                No sneezing, itching, ear ache, nasal congestion, post nasal drip,   CV:  No chest pain,  Orthopnea, PND, swelling in lower extremities, anasarca, dizziness, palpitations  GI  No heartburn, indigestion, abdominal pain, nausea, vomiting, diarrhea, change in bowel habits, loss of appetite  Resp: No shortness of breath with exertion or at rest.  No excess mucus, no productive cough,  No non-productive cough,  No coughing up of blood.  No change in color of mucus.  No wheezing.  No chest wall deformity  Skin: no rash or lesions.  GU: no dysuria, change in color of urine, no urgency or frequency.  No flank pain.  MS:  No joint pain or swelling.  No decreased range of motion.  No back pain.  Psych:  No change in mood or affect. No depression or anxiety.  No memory loss.     Objective:   Physical Exam Filed Vitals:   12/06/13 1103  BP: 122/72  Pulse: 66  Temp: 98.3 F (36.8 C)  TempSrc: Oral    Height: 5' (1.524 m)  Weight: 142 lb 12.8 oz (64.774 kg)  SpO2: 92%    Gen: Pleasant, well-nourished, in no distress,  normal affect  ENT: No lesions,  mouth clear,  oropharynx clear, no postnasal drip  Neck: No JVD, no TMG, no carotid bruits  Lungs: No use of accessory muscles, no dullness to percussion, clear without rales or rhonchi  Cardiovascular: RRR, heart sounds normal, no murmur or gallops, no peripheral edema  Abdomen: soft and NT, no HSM,  BS normal  Musculoskeletal: No deformities, no cyanosis or clubbing  Neuro: alert, non focal  Skin: Warm, no lesions or rashes  No results found.        Assessment & Plan:   Extrinsic asthma Moderate persistent asthma  Improved with xolair Rx Plan Cont inhaled meds Cont xolair    Updated Medication List Outpatient Encounter Prescriptions as of 12/06/2013  Medication Sig  . albuterol-ipratropium (COMBIVENT) 18-103 MCG/ACT inhaler Inhale 2 puffs into the lungs every 6 (six) hours as needed for wheezing.  . budesonide (PULMICORT) 0.25 MG/2ML nebulizer solution Take 2 mLs (0.25 mg total) by nebulization daily. Once daily and will take again if needed  . calcium-vitamin D (CALCIUM 500/D) 500-200 MG-UNIT per tablet Take 1 tablet by mouth. At times  . Cholecalciferol (VITAMIN D PO) Take 2,000 Units by mouth.  . EPINEPHrine (EPIPEN JR) 0.15 MG/0.3ML injection Inject 0.15  mg into the muscle as needed.    [name] levothyroxine (SYNTHROID, LEVOTHROID) 75 MCG tablet TAKE 1 TABLET DAILY  . loratadine (CLARITIN) 10 MG tablet Take 10 mg by mouth daily.    [name] omalizumab (XOLAIR) 150 MG injection Inject 150 mg into the skin every 28 (twenty-eight) days.    [name] sertraline (ZOLOFT) 50 MG tablet Take 1 tablet (50 mg total) by mouth daily.  . simvastatin (ZOCOR) 40 MG tablet Take 1 tablet (40 mg total) by mouth at bedtime.  . [DISCONTINUED] albuterol-ipratropium (COMBIVENT) 18-103 MCG/ACT inhaler Inhale 2 puffs into the lungs every 6 (six) hours  as needed for wheezing.  . [DISCONTINUED] budesonide (PULMICORT) 0.25 MG/2ML nebulizer solution Take 2 mLs (0.25 mg total) by nebulization daily. Once daily and will take again if needed

## 2013-12-07 DIAGNOSIS — H2513 Age-related nuclear cataract, bilateral: Secondary | ICD-10-CM | POA: Diagnosis not present

## 2013-12-10 ENCOUNTER — Other Ambulatory Visit: Payer: Self-pay | Admitting: *Deleted

## 2013-12-10 MED ORDER — IPRATROPIUM-ALBUTEROL 20-100 MCG/ACT IN AERS: 1.0000 | INHALATION_SPRAY | Freq: Four times a day (QID) | RESPIRATORY_TRACT | Status: DC | PRN

## 2013-12-13 MED ORDER — OMALIZUMAB 150 MG ~~LOC~~ SOLR
150.0000 mg | Freq: Once | SUBCUTANEOUS | Status: AC
  Administered 2013-12-13: 150 mg via SUBCUTANEOUS

## 2014-01-03 ENCOUNTER — Ambulatory Visit (INDEPENDENT_AMBULATORY_CARE_PROVIDER_SITE_OTHER): Payer: Medicare Other

## 2014-01-03 DIAGNOSIS — J4540 Moderate persistent asthma, uncomplicated: Secondary | ICD-10-CM

## 2014-01-03 MED ORDER — OMALIZUMAB 150 MG ~~LOC~~ SOLR
150.0000 mg | Freq: Once | SUBCUTANEOUS | Status: AC
  Administered 2014-01-03: 150 mg via SUBCUTANEOUS

## 2014-01-23 ENCOUNTER — Other Ambulatory Visit: Payer: Self-pay | Admitting: Physician Assistant

## 2014-02-01 ENCOUNTER — Ambulatory Visit (INDEPENDENT_AMBULATORY_CARE_PROVIDER_SITE_OTHER): Payer: Medicare Other

## 2014-02-01 DIAGNOSIS — J4520 Mild intermittent asthma, uncomplicated: Secondary | ICD-10-CM | POA: Diagnosis not present

## 2014-02-02 MED ORDER — OMALIZUMAB 150 MG ~~LOC~~ SOLR
150.0000 mg | Freq: Once | SUBCUTANEOUS | Status: AC
  Administered 2014-02-01: 150 mg via SUBCUTANEOUS

## 2014-02-03 ENCOUNTER — Other Ambulatory Visit: Payer: Self-pay | Admitting: Obstetrics and Gynecology

## 2014-02-03 DIAGNOSIS — Z124 Encounter for screening for malignant neoplasm of cervix: Secondary | ICD-10-CM | POA: Diagnosis not present

## 2014-02-03 DIAGNOSIS — Z1231 Encounter for screening mammogram for malignant neoplasm of breast: Secondary | ICD-10-CM | POA: Diagnosis not present

## 2014-02-07 LAB — CYTOLOGY - PAP

## 2014-03-04 ENCOUNTER — Ambulatory Visit (INDEPENDENT_AMBULATORY_CARE_PROVIDER_SITE_OTHER): Payer: Medicare Other

## 2014-03-04 DIAGNOSIS — J4520 Mild intermittent asthma, uncomplicated: Secondary | ICD-10-CM | POA: Diagnosis not present

## 2014-03-04 MED ORDER — OMALIZUMAB 150 MG ~~LOC~~ SOLR
150.0000 mg | Freq: Once | SUBCUTANEOUS | Status: AC
  Administered 2014-03-04: 150 mg via SUBCUTANEOUS

## 2014-03-15 ENCOUNTER — Ambulatory Visit: Payer: Self-pay | Admitting: Physician Assistant

## 2014-03-23 ENCOUNTER — Ambulatory Visit (INDEPENDENT_AMBULATORY_CARE_PROVIDER_SITE_OTHER): Payer: Medicare Other | Admitting: Physician Assistant

## 2014-03-23 ENCOUNTER — Encounter: Payer: Self-pay | Admitting: Physician Assistant

## 2014-03-23 VITALS — BP 102/60 | HR 64 | Temp 98.8°F | Resp 16 | Ht 60.0 in | Wt 138.0 lb

## 2014-03-23 DIAGNOSIS — E559 Vitamin D deficiency, unspecified: Secondary | ICD-10-CM | POA: Diagnosis not present

## 2014-03-23 DIAGNOSIS — E785 Hyperlipidemia, unspecified: Secondary | ICD-10-CM

## 2014-03-23 DIAGNOSIS — Z79899 Other long term (current) drug therapy: Secondary | ICD-10-CM

## 2014-03-23 DIAGNOSIS — E039 Hypothyroidism, unspecified: Secondary | ICD-10-CM | POA: Diagnosis not present

## 2014-03-23 DIAGNOSIS — R7309 Other abnormal glucose: Secondary | ICD-10-CM

## 2014-03-23 DIAGNOSIS — I10 Essential (primary) hypertension: Secondary | ICD-10-CM | POA: Diagnosis not present

## 2014-03-23 DIAGNOSIS — R7303 Prediabetes: Secondary | ICD-10-CM

## 2014-03-23 LAB — CBC WITH DIFFERENTIAL/PLATELET
BASOS PCT: 1 % (ref 0–1)
Basophils Absolute: 0.1 10*3/uL (ref 0.0–0.1)
EOS ABS: 0.1 10*3/uL (ref 0.0–0.7)
Eosinophils Relative: 2 % (ref 0–5)
HCT: 43 % (ref 36.0–46.0)
Hemoglobin: 14.3 g/dL (ref 12.0–15.0)
Lymphocytes Relative: 21 % (ref 12–46)
Lymphs Abs: 1.4 10*3/uL (ref 0.7–4.0)
MCH: 29.9 pg (ref 26.0–34.0)
MCHC: 33.3 g/dL (ref 30.0–36.0)
MCV: 89.8 fL (ref 78.0–100.0)
MONOS PCT: 10 % (ref 3–12)
MPV: 11 fL (ref 8.6–12.4)
Monocytes Absolute: 0.7 10*3/uL (ref 0.1–1.0)
Neutro Abs: 4.3 10*3/uL (ref 1.7–7.7)
Neutrophils Relative %: 66 % (ref 43–77)
Platelets: 286 10*3/uL (ref 150–400)
RBC: 4.79 MIL/uL (ref 3.87–5.11)
RDW: 14.1 % (ref 11.5–15.5)
WBC: 6.5 10*3/uL (ref 4.0–10.5)

## 2014-03-23 LAB — HEPATIC FUNCTION PANEL
ALK PHOS: 68 U/L (ref 39–117)
ALT: 12 U/L (ref 0–35)
AST: 20 U/L (ref 0–37)
Albumin: 4.2 g/dL (ref 3.5–5.2)
BILIRUBIN DIRECT: 0.1 mg/dL (ref 0.0–0.3)
BILIRUBIN INDIRECT: 0.5 mg/dL (ref 0.2–1.2)
Total Bilirubin: 0.6 mg/dL (ref 0.2–1.2)
Total Protein: 7.1 g/dL (ref 6.0–8.3)

## 2014-03-23 LAB — BASIC METABOLIC PANEL WITH GFR
BUN: 13 mg/dL (ref 6–23)
CO2: 28 meq/L (ref 19–32)
Calcium: 9.9 mg/dL (ref 8.4–10.5)
Chloride: 102 mEq/L (ref 96–112)
Creat: 0.83 mg/dL (ref 0.50–1.10)
GFR, Est African American: 78 mL/min
GFR, Est Non African American: 68 mL/min
Glucose, Bld: 108 mg/dL — ABNORMAL HIGH (ref 70–99)
Potassium: 4.1 mEq/L (ref 3.5–5.3)
Sodium: 139 mEq/L (ref 135–145)

## 2014-03-23 LAB — TSH: TSH: 6.229 u[IU]/mL — ABNORMAL HIGH (ref 0.350–4.500)

## 2014-03-23 LAB — LIPID PANEL
Cholesterol: 179 mg/dL (ref 0–200)
HDL: 64 mg/dL (ref >39)
LDL Cholesterol: 94 mg/dL (ref 0–99)
Total CHOL/HDL Ratio: 2.8 Ratio
Triglycerides: 104 mg/dL (ref <150)
VLDL: 21 mg/dL (ref 0–40)

## 2014-03-23 LAB — MAGNESIUM: Magnesium: 2 mg/dL (ref 1.5–2.5)

## 2014-03-23 NOTE — Progress Notes (Signed)
Assessment and Plan:  Hypertension: Continue medication, monitor blood pressure at home. Continue DASH diet.  Reminder to go to the ER if any CP, SOB, nausea, dizziness, severe HA, changes vision/speech, left arm numbness and tingling, and jaw pain. Cholesterol: Continue diet and exercise. Check cholesterol.  Pre-diabetes-Continue diet and exercise. Check A1C Vitamin D Def- check level and continue medications.  Anxiety/abnormal stool- check labs, increase zoloft to 100mg  for the time being,   Continue diet and meds as discussed. Further disposition pending results of labs.  HPI 81 y.o. female  presents for 3 month follow up with hypertension, hyperlipidemia, prediabetes and vitamin D.  Her blood pressure has been controlled at home, today their BP is BP: 102/60 mmHg  She does not workout. She denies chest pain, shortness of breath, dizziness.  She is on cholesterol medication and denies myalgias. Her cholesterol is at goal. The cholesterol last visit was:   Lab Results  Component Value Date   CHOL 161 11/22/2013   HDL 63 11/22/2013   LDLCALC 72 11/22/2013   TRIG 130 11/22/2013   CHOLHDL 2.6 11/22/2013    She has been working on diet and exercise for prediabetes, and denies paresthesia of the feet, polydipsia, polyuria and visual disturbances. Last A1C in the office was:  Lab Results  Component Value Date   HGBA1C 5.8* 11/22/2013   Patient is on Vitamin D supplement.   Lab Results  Component Value Date   VD25OH 2 11/22/2013     She had major dental surgery several weeks ago, she states that she has several missing lower teeth. She was on vacation and has a permanent partial broken and broken 2 teeth and she has had to see an oral surgeon and has been under a lot of stress.  She is waking up around 5 AM with trauma on her mind, she will try to urinate but can not, then she will finally have a BM around 8 AM and feels better, Denies diarrhea, blood, mucus, black stool.   Current  Medications:  Current Outpatient Prescriptions on File Prior to Visit  Medication Sig Dispense Refill  . budesonide (PULMICORT) 0.25 MG/2ML nebulizer solution Take 2 mLs (0.25 mg total) by nebulization daily. Once daily and will take again if needed 60 mL 11  . calcium-vitamin D (CALCIUM 500/D) 500-200 MG-UNIT per tablet Take 1 tablet by mouth. At times    . Cholecalciferol (VITAMIN D PO) Take 2,000 Units by mouth.    . EPINEPHrine (EPIPEN JR) 0.15 MG/0.3ML injection Inject 0.15 mg into the muscle as needed.      . Ipratropium-Albuterol (COMBIVENT RESPIMAT) 20-100 MCG/ACT AERS respimat Inhale 1 puff into the lungs every 6 (six) hours as needed for wheezing. 3 Inhaler 1  . levothyroxine (SYNTHROID, LEVOTHROID) 75 MCG tablet TAKE 1 TABLET DAILY (NEEDS OFFICE VISIT) 90 tablet 99  . loratadine (CLARITIN) 10 MG tablet Take 10 mg by mouth daily.      [name] omalizumab (XOLAIR) 150 MG injection Inject 150 mg into the skin every 28 (twenty-eight) days.      [name] sertraline (ZOLOFT) 50 MG tablet Take 1 tablet (50 mg total) by mouth daily. 90 tablet 3  . simvastatin (ZOCOR) 40 MG tablet Take 1 tablet (40 mg total) by mouth at bedtime. 90 tablet 3   No current facility-administered medications on file prior to visit.   Medical History:  Past Medical History  Diagnosis Date  . Asthma   . Hyperlipemia   . Hypothyroidism   .  Hypertension   . COPD (chronic obstructive pulmonary disease)   . Prediabetes   . Vitamin D deficiency   . SVD (spontaneous vaginal delivery)     x 2  . Depression    Allergies:  Allergies  Allergen Reactions  . Codeine Nausea Only  . Propoxyphene N-Acetaminophen Nausea Only  . Shellfish-Derived Products Rash     Review of Systems:  Review of Systems  Constitutional: Negative.   HENT: Negative.   Respiratory: Negative.   Cardiovascular: Negative.   Gastrointestinal: Negative.   Genitourinary: Negative.   Musculoskeletal: Negative.   Skin: Negative.   Neurological:  Negative.   Psychiatric/Behavioral: Positive for depression. Negative for suicidal ideas, hallucinations, memory loss and substance abuse. The patient is nervous/anxious. The patient does not have insomnia.     Family history- Review and unchanged Social history- Review and unchanged Physical Exam: BP 102/60 mmHg  Pulse 64  Temp(Src) 98.8 F (37.1 C)  Resp 16  Ht 5' (1.524 m)  Wt 138 lb (62.596 kg)  BMI 26.95 kg/m2 Wt Readings from Last 3 Encounters:  03/23/14 138 lb (62.596 kg)  12/06/13 142 lb 12.8 oz (64.774 kg)  11/22/13 142 lb (64.411 kg)   General Appearance: Well nourished, in no apparent distress. Eyes: PERRLA, EOMs, conjunctiva no swelling or erythema Sinuses: No Frontal/maxillary tenderness ENT/Mouth: Ext aud canals clear, TMs without erythema, bulging. No erythema, swelling, or exudate on post pharynx.  Tonsils not swollen or erythematous. Hearing normal.  Neck: Supple, thyroid normal.  Respiratory: Respiratory effort normal, BS equal bilaterally without rales, rhonchi, wheezing or stridor.  Cardio: RRR with no MRGs. Brisk peripheral pulses without edema.  Abdomen: Soft, + BS,  Non tender, no guarding, rebound, hernias, masses. Lymphatics: Non tender without lymphadenopathy.  Musculoskeletal: Full ROM, 5/5 strength, Normal gait Skin: Warm, dry without rashes, lesions, ecchymosis.  Neuro: Cranial nerves intact. Normal muscle tone, no cerebellar symptoms. Psych: Awake and oriented X 3, normal affect, Insight and Judgment appropriate.    Vicie Mutters, PA-C 12:00 PM [facility]

## 2014-03-23 NOTE — Patient Instructions (Signed)
Before you even begin to attack a weight-loss plan, it pays to remember this: You are not fat. You have fat. Losing weight isn't about blame or shame; it's simply another achievement to accomplish. Dieting is like any other skill-you have to buckle down and work at it. As long as you act in a smart, reasonable way, you'll ultimately get where you want to be. Here are some weight loss pearls for you.  1. It's Not a Diet. It's a Lifestyle Thinking of a diet as something you're on and suffering through only for the short term doesn't work. To shed weight and keep it off, you need to make permanent changes to the way you eat. It's OK to indulge occasionally, of course, but if you cut calories temporarily and then revert to your old way of eating, you'll gain back the weight quicker than you can say yo-yo. Use it to lose it. Research shows that one of the best predictors of long-term weight loss is how many pounds you drop in the first month. For that reason, nutritionists often suggest being stricter for the first two weeks of your new eating strategy to build momentum. Cut out added sugar and alcohol and avoid unrefined carbs. After that, figure out how you can reincorporate them in a way that's healthy and maintainable.  2. There's a Right Way to Exercise Working out burns calories and fat and boosts your metabolism by building muscle. But those trying to lose weight are notorious for overestimating the number of calories they burn and underestimating the amount they take in. Unfortunately, your system is biologically programmed to hold on to extra pounds and that means when you start exercising, your body senses the deficit and ramps up its hunger signals. If you're not diligent, you'll eat everything you burn and then some. Use it to lose it. Cardio gets all the exercise glory, but strength and interval training are the real heroes. They help you build lean muscle, which in turn increases your metabolism and  calorie-burning ability 3. Don't Overreact to Mild Hunger Some people have a hard time losing weight because of hunger anxiety. To them, being hungry is bad-something to be avoided at all costs-so they carry snacks with them and eat when they don't need to. Others eat because they're stressed out or bored. While you never want to get to the point of being ravenous (that's when bingeing is likely to happen), a hunger pang, a craving, or the fact that it's 3:00 p.m. should not send you racing for the vending machine or obsessing about the energy bar in your purse. Ideally, you should put off eating until your stomach is growling and it's difficult to concentrate.  Use it to lose it. When you feel the urge to eat, use the HALT method. Ask yourself, Am I really hungry? Or am I angry or anxious, lonely or bored, or tired? If you're still not certain, try the apple test. If you're truly hungry, an apple should seem delicious; if it doesn't, something else is going on. Or you can try drinking water and making yourself busy, if you are still hungry try a healthy snack.  4. Not All Calories Are Created Equal The mechanics of weight loss are pretty simple: Take in fewer calories than you use for energy. But the kind of food you eat makes all the difference. Processed food that's high in saturated fat and refined starch or sugar can cause inflammation that disrupts the hormone signals that tell  your brain you're full. The result: You eat a lot more.  Use it to lose it. Clean up your diet. Swap in whole, unprocessed foods, including vegetables, lean protein, and healthy fats that will fill you up and give you the biggest nutritional bang for your calorie [place]. In a few weeks, as your brain starts receiving regular hunger and fullness signals once again, you'll notice that you feel less hungry overall and naturally start cutting back on the amount you eat.  5. Protein, Produce, and Plant-Based Fats Are Your Weight-Loss  [place] Here's why eating the three Ps regularly will help you drop pounds. Protein fills you up. You need it to build lean muscle, which keeps your metabolism humming so that you can torch more fat. People in a weight-loss program who ate double the recommended daily allowance for protein (about 110 grams for a 150-pound woman) lost 70 percent of their weight from fat, while people who ate the RDA lost only about 40 percent, one study found. Produce is packed with filling fiber. "It's very difficult to consume too many calories if you're eating a lot of vegetables. Example: Three cups of broccoli is a lot of food, yet only 93 calories. (Fruit is another story. It can be easy to overeat and can contain a lot of calories from sugar, so be sure to monitor your intake.) Plant-based fats like olive oil and those in avocados and nuts are healthy and extra satiating.  Use it to lose it. Aim to incorporate each of the three Ps into every meal and snack. People who eat protein throughout the day are able to keep weight off, according to a study in the [place] of Clinical Nutrition. In addition to meat, poultry and seafood, good sources are beans, lentils, eggs, tofu, and yogurt. As for fat, keep portion sizes in check by measuring out salad dressing, oil, and nut butters (shoot for one to two tablespoons). Finally, eat veggies or a little fruit at every meal. People who did that consumed 308 fewer calories but didn't feel any hungrier than when they didn't eat more produce.  7. How You Eat Is As Important As What You Eat In order for your brain to register that you're full, you need to focus on what you're eating. Sit down whenever you eat, preferably at a table. Turn off the TV or computer, put down your phone, and look at your food. Smell it. Chew slowly, and don't put another bite on your fork until you swallow. When women ate lunch this attentively, they consumed 30 percent less when snacking later than  those who listened to an audiobook at lunchtime, according to a study in the [place] of Nutrition. 8. Weighing Yourself Really Works The scale provides the best evidence about whether your efforts are paying off. Seeing the numbers tick up or down or stagnate is motivation to keep going-or to rethink your approach. A 2015 study at [facility] found that daily weigh-ins helped people lose more weight, keep it off, and maintain that loss, even after two years. Use it to lose it. Step on the scale at the same time every day for the best results. If your weight shoots up several pounds from one weigh-in to the next, don't freak out. Eating a lot of salt the night before or having your period is the likely culprit. The number should return to normal in a day or two. It's a steady climb that you need to do something about.  9. Too Much Stress and Too Little Sleep Are Your Enemies When you're tired and frazzled, your body cranks up the production of cortisol, the stress hormone that can cause carb cravings. Not getting enough sleep also boosts your levels of ghrelin, a hormone associated with hunger, while suppressing leptin, a hormone that signals fullness and satiety. People on a diet who slept only five and a half hours a night for two weeks lost 55 percent less fat and were hungrier than those who slept eight and a half hours, according to a study in the [place]. Use it to lose it. Prioritize sleep, aiming for seven hours or more a night, which research shows helps lower stress. And make sure you're getting quality zzz's. If a snoring spouse or a fidgety cat wakes you up frequently throughout the night, you may end up getting the equivalent of just four hours of sleep, according to a study from [facility]. Keep pets out of the bedroom, and use a white-noise app to drown out snoring. 10. You Will Hit a plateau-And You Can Bust Through It As you slim down, your  body releases much less leptin, the fullness hormone.  If you're not strength training, start right now. Building muscle can raise your metabolism to help you overcome a plateau. To keep your body challenged and burning calories, incorporate new moves and more intense intervals into your workouts or add another sweat session to your weekly routine. Alternatively, cut an extra 100 calories or so a day from your diet. Now that you've lost weight, your body simply doesn't need as much fuel.   Stress and Stress Management Stress is a normal reaction to life events. It is what you feel when life demands more than you are used to or more than you can handle. Some stress can be useful. For example, the stress reaction can help you catch the last bus of the day, study for a test, or meet a deadline at work. But stress that occurs too often or for too long can cause problems. It can affect your emotional health and interfere with relationships and normal daily activities. Too much stress can weaken your immune system and increase your risk for physical illness. If you already have a medical problem, stress can make it worse. CAUSES  All sorts of life events may cause stress. An event that causes stress for one person may not be stressful for another person. Major life events commonly cause stress. These may be positive or negative. Examples include losing your job, moving into a new home, getting married, having a baby, or losing a loved one. Less obvious life events may also cause stress, especially if they occur day after day or in combination. Examples include working long hours, driving in traffic, caring for children, being in debt, or being in a difficult relationship. SIGNS AND SYMPTOMS Stress may cause emotional symptoms including, the following:  Anxiety. This is feeling worried, afraid, on edge, overwhelmed, or out of control.  Anger. This is feeling irritated or impatient.  Depression. This is feeling  sad, down, helpless, or guilty.  Difficulty focusing, remembering, or making decisions. Stress may cause physical symptoms, including the following:   Aches and pains. These may affect your head, neck, back, stomach, or other areas of your body.  Tight muscles or clenched jaw.  Low energy or trouble sleeping. Stress may cause unhealthy behaviors, including the following:   Eating to feel better (overeating) or skipping meals.  Sleeping too little, too much, or both.  Working too much or putting off tasks (procrastination).  Smoking, drinking alcohol, or using drugs to feel better. DIAGNOSIS  Stress is diagnosed through an assessment by your health care provider. Your health care provider will ask questions about your symptoms and any stressful life events.Your health care provider will also ask about your medical history and may order blood tests or other tests. Certain medical conditions and medicine can cause physical symptoms similar to stress. Mental illness can cause emotional symptoms and unhealthy behaviors similar to stress. Your health care provider may refer you to a mental health professional for further evaluation.  TREATMENT  Stress management is the recommended treatment for stress.The goals of stress management are reducing stressful life events and coping with stress in healthy ways.  Techniques for reducing stressful life events include the following:  Stress identification. Self-monitor for stress and identify what causes stress for you. These skills may help you to avoid some stressful events.  Time management. Set your priorities, keep a calendar of events, and learn to say "no." These tools can help you avoid making too many commitments. Techniques for coping with stress include the following:  Rethinking the problem. Try to think realistically about stressful events rather than ignoring them or overreacting. Try to find the positives in a stressful situation  rather than focusing on the negatives.  Exercise. Physical exercise can release both physical and emotional tension. The key is to find a form of exercise you enjoy and do it regularly.  Relaxation techniques. These relax the body and mind. Examples include yoga, meditation, tai chi, biofeedback, deep breathing, progressive muscle relaxation, listening to music, being out in nature, journaling, and other hobbies. Again, the key is to find one or more that you enjoy and can do regularly.  Healthy lifestyle. Eat a balanced diet, get plenty of sleep, and do not smoke. Avoid using alcohol or drugs to relax.  Strong support network. Spend time with family, friends, or other people you enjoy being around.Express your feelings and talk things over with someone you trust. Counseling or talktherapy with a mental health professional may be helpful if you are having difficulty managing stress on your own. Medicine is typically not recommended for the treatment of stress.Talk to your health care provider if you think you need medicine for symptoms of stress. HOME CARE INSTRUCTIONS  Keep all follow-up visits as directed by your health care provider.  Take all medicines as directed by your health care provider. SEEK MEDICAL CARE IF:  Your symptoms get worse or you start having new symptoms.  You feel overwhelmed by your problems and can no longer manage them on your own. SEEK IMMEDIATE MEDICAL CARE IF:  You feel like hurting yourself or someone else. Document Released: 07/17/2000 Document Revised: 06/07/2013 Document Reviewed: 09/15/2012 [facility] Patient Information 2015 [place], [place]. This information is not intended to replace advice given to you by your health care provider. Make sure you discuss any questions you have with your health care provider.

## 2014-03-24 LAB — HEMOGLOBIN A1C
HEMOGLOBIN A1C: 5.6 % (ref <5.7)
MEAN PLASMA GLUCOSE: 114 mg/dL (ref <117)

## 2014-04-01 ENCOUNTER — Ambulatory Visit (INDEPENDENT_AMBULATORY_CARE_PROVIDER_SITE_OTHER): Payer: Medicare Other

## 2014-04-01 DIAGNOSIS — J4520 Mild intermittent asthma, uncomplicated: Secondary | ICD-10-CM | POA: Diagnosis not present

## 2014-04-01 MED ORDER — OMALIZUMAB 150 MG ~~LOC~~ SOLR
150.0000 mg | Freq: Once | SUBCUTANEOUS | Status: AC
  Administered 2014-04-01: 150 mg via SUBCUTANEOUS

## 2014-04-06 ENCOUNTER — Ambulatory Visit: Payer: Medicare Other

## 2014-04-26 ENCOUNTER — Ambulatory Visit: Payer: Self-pay

## 2014-04-30 ENCOUNTER — Other Ambulatory Visit: Payer: Self-pay | Admitting: Critical Care Medicine

## 2014-05-02 ENCOUNTER — Ambulatory Visit: Payer: Medicare Other

## 2014-05-05 ENCOUNTER — Ambulatory Visit (INDEPENDENT_AMBULATORY_CARE_PROVIDER_SITE_OTHER): Payer: Medicare Other

## 2014-05-05 DIAGNOSIS — J4520 Mild intermittent asthma, uncomplicated: Secondary | ICD-10-CM | POA: Diagnosis not present

## 2014-05-06 MED ORDER — OMALIZUMAB 150 MG ~~LOC~~ SOLR
150.0000 mg | Freq: Once | SUBCUTANEOUS | Status: AC
  Administered 2014-05-05: 150 mg via SUBCUTANEOUS

## 2014-05-18 ENCOUNTER — Ambulatory Visit: Payer: Self-pay

## 2014-05-18 ENCOUNTER — Ambulatory Visit (INDEPENDENT_AMBULATORY_CARE_PROVIDER_SITE_OTHER): Payer: Medicare Other | Admitting: *Deleted

## 2014-05-18 DIAGNOSIS — H6123 Impacted cerumen, bilateral: Secondary | ICD-10-CM | POA: Diagnosis not present

## 2014-05-18 NOTE — Progress Notes (Signed)
Patient ID: Kathy Whitaker, female   DOB: [birth year], 81 y.o.   MRN: [id number] Patient presents for bilateral Ear Lavage to remove cerumen impaction.  Success bilat clearing of cerumen impaction.

## 2014-06-06 ENCOUNTER — Ambulatory Visit (INDEPENDENT_AMBULATORY_CARE_PROVIDER_SITE_OTHER): Payer: Medicare Other

## 2014-06-06 DIAGNOSIS — J4520 Mild intermittent asthma, uncomplicated: Secondary | ICD-10-CM

## 2014-06-07 MED ORDER — OMALIZUMAB 150 MG ~~LOC~~ SOLR
150.0000 mg | Freq: Once | SUBCUTANEOUS | Status: AC
  Administered 2014-06-06: 150 mg via SUBCUTANEOUS

## 2014-06-21 ENCOUNTER — Ambulatory Visit: Payer: Self-pay | Admitting: Internal Medicine

## 2014-07-06 ENCOUNTER — Ambulatory Visit (INDEPENDENT_AMBULATORY_CARE_PROVIDER_SITE_OTHER): Payer: Medicare Other

## 2014-07-06 DIAGNOSIS — J4540 Moderate persistent asthma, uncomplicated: Secondary | ICD-10-CM

## 2014-07-07 MED ORDER — OMALIZUMAB 150 MG ~~LOC~~ SOLR
150.0000 mg | Freq: Once | SUBCUTANEOUS | Status: AC
  Administered 2014-07-06: 150 mg via SUBCUTANEOUS

## 2014-08-05 ENCOUNTER — Ambulatory Visit (INDEPENDENT_AMBULATORY_CARE_PROVIDER_SITE_OTHER): Payer: Medicare Other

## 2014-08-05 DIAGNOSIS — J4540 Moderate persistent asthma, uncomplicated: Secondary | ICD-10-CM | POA: Diagnosis not present

## 2014-08-10 MED ORDER — OMALIZUMAB 150 MG ~~LOC~~ SOLR
150.0000 mg | Freq: Once | SUBCUTANEOUS | Status: AC
  Administered 2014-08-05: 150 mg via SUBCUTANEOUS

## 2014-08-24 ENCOUNTER — Telehealth: Payer: Self-pay | Admitting: Critical Care Medicine

## 2014-08-24 NOTE — Telephone Encounter (Signed)
#   vials:1 Ordered date:08/24/14 Shipping Date:7/21 or 22/16

## 2014-08-25 NOTE — Telephone Encounter (Signed)
#   Vials:1 Arrival Date:08/25/14 Lot #:[id number] Exp Date:1/20

## 2014-09-06 ENCOUNTER — Ambulatory Visit: Payer: Medicare Other

## 2014-09-08 ENCOUNTER — Ambulatory Visit (INDEPENDENT_AMBULATORY_CARE_PROVIDER_SITE_OTHER): Payer: Medicare Other

## 2014-09-08 DIAGNOSIS — J4540 Moderate persistent asthma, uncomplicated: Secondary | ICD-10-CM

## 2014-09-08 MED ORDER — OMALIZUMAB 150 MG ~~LOC~~ SOLR
150.0000 mg | Freq: Once | SUBCUTANEOUS | Status: AC
  Administered 2014-09-08: 150 mg via SUBCUTANEOUS

## 2014-09-21 ENCOUNTER — Telehealth: Payer: Self-pay | Admitting: Critical Care Medicine

## 2014-09-22 ENCOUNTER — Telehealth: Payer: Self-pay | Admitting: Critical Care Medicine

## 2014-09-22 NOTE — Telephone Encounter (Signed)
error 

## 2014-09-22 NOTE — Telephone Encounter (Signed)
#   vials:1 Ordered date:09/21/14 Shipping Date:09/22/14

## 2014-09-22 NOTE — Telephone Encounter (Signed)
#   Vials:1 Arrival Date:09/22/14 Lot #:[id number] Exp Date:2/20

## 2014-10-09 ENCOUNTER — Other Ambulatory Visit: Payer: Self-pay | Admitting: Internal Medicine

## 2014-10-11 ENCOUNTER — Ambulatory Visit (INDEPENDENT_AMBULATORY_CARE_PROVIDER_SITE_OTHER): Payer: Medicare Other

## 2014-10-11 DIAGNOSIS — J4540 Moderate persistent asthma, uncomplicated: Secondary | ICD-10-CM | POA: Diagnosis not present

## 2014-10-18 MED ORDER — OMALIZUMAB 150 MG ~~LOC~~ SOLR
150.0000 mg | Freq: Once | SUBCUTANEOUS | Status: AC
  Administered 2014-10-11: 150 mg via SUBCUTANEOUS

## 2014-10-20 ENCOUNTER — Telehealth: Payer: Self-pay | Admitting: Critical Care Medicine

## 2014-10-21 NOTE — Telephone Encounter (Signed)
#   Vials:1 Arrival Date:10-11-14 Lot #:[id number] Exp Date:06/2018

## 2014-10-21 NOTE — Telephone Encounter (Signed)
#   vials:1 Ordered date:10-20-14 Shipping Date:10-21-14

## 2014-10-27 ENCOUNTER — Other Ambulatory Visit: Payer: Self-pay | Admitting: Critical Care Medicine

## 2014-11-11 ENCOUNTER — Ambulatory Visit (INDEPENDENT_AMBULATORY_CARE_PROVIDER_SITE_OTHER): Payer: Medicare Other

## 2014-11-11 DIAGNOSIS — J4540 Moderate persistent asthma, uncomplicated: Secondary | ICD-10-CM | POA: Diagnosis not present

## 2014-11-14 MED ORDER — OMALIZUMAB 150 MG ~~LOC~~ SOLR
150.0000 mg | Freq: Once | SUBCUTANEOUS | Status: AC
  Administered 2014-11-11: 150 mg via SUBCUTANEOUS

## 2014-11-21 ENCOUNTER — Ambulatory Visit (INDEPENDENT_AMBULATORY_CARE_PROVIDER_SITE_OTHER): Payer: Medicare Other | Admitting: *Deleted

## 2014-11-21 DIAGNOSIS — Z23 Encounter for immunization: Secondary | ICD-10-CM

## 2014-11-29 ENCOUNTER — Ambulatory Visit (INDEPENDENT_AMBULATORY_CARE_PROVIDER_SITE_OTHER): Payer: Medicare Other | Admitting: Internal Medicine

## 2014-11-29 ENCOUNTER — Encounter: Payer: Self-pay | Admitting: Internal Medicine

## 2014-11-29 VITALS — BP 112/66 | HR 72 | Temp 97.8°F | Resp 16 | Ht 59.75 in | Wt 136.0 lb

## 2014-11-29 DIAGNOSIS — E785 Hyperlipidemia, unspecified: Secondary | ICD-10-CM | POA: Diagnosis not present

## 2014-11-29 DIAGNOSIS — Z6824 Body mass index (BMI) 24.0-24.9, adult: Secondary | ICD-10-CM | POA: Insufficient documentation

## 2014-11-29 DIAGNOSIS — E559 Vitamin D deficiency, unspecified: Secondary | ICD-10-CM | POA: Diagnosis not present

## 2014-11-29 DIAGNOSIS — E039 Hypothyroidism, unspecified: Secondary | ICD-10-CM

## 2014-11-29 DIAGNOSIS — R7309 Other abnormal glucose: Secondary | ICD-10-CM | POA: Diagnosis not present

## 2014-11-29 DIAGNOSIS — Z1389 Encounter for screening for other disorder: Secondary | ICD-10-CM

## 2014-11-29 DIAGNOSIS — K219 Gastro-esophageal reflux disease without esophagitis: Secondary | ICD-10-CM | POA: Diagnosis not present

## 2014-11-29 DIAGNOSIS — Z789 Other specified health status: Secondary | ICD-10-CM

## 2014-11-29 DIAGNOSIS — Z0000 Encounter for general adult medical examination without abnormal findings: Secondary | ICD-10-CM | POA: Insufficient documentation

## 2014-11-29 DIAGNOSIS — R7303 Prediabetes: Secondary | ICD-10-CM | POA: Diagnosis not present

## 2014-11-29 DIAGNOSIS — Z79899 Other long term (current) drug therapy: Secondary | ICD-10-CM | POA: Diagnosis not present

## 2014-11-29 DIAGNOSIS — Z6826 Body mass index (BMI) 26.0-26.9, adult: Secondary | ICD-10-CM

## 2014-11-29 DIAGNOSIS — Z1331 Encounter for screening for depression: Secondary | ICD-10-CM

## 2014-11-29 DIAGNOSIS — I10 Essential (primary) hypertension: Secondary | ICD-10-CM | POA: Diagnosis not present

## 2014-11-29 DIAGNOSIS — Z9181 History of falling: Secondary | ICD-10-CM

## 2014-11-29 DIAGNOSIS — F329 Major depressive disorder, single episode, unspecified: Secondary | ICD-10-CM | POA: Diagnosis not present

## 2014-11-29 DIAGNOSIS — J4530 Mild persistent asthma, uncomplicated: Secondary | ICD-10-CM | POA: Diagnosis not present

## 2014-11-29 DIAGNOSIS — Z1212 Encounter for screening for malignant neoplasm of rectum: Secondary | ICD-10-CM

## 2014-11-29 DIAGNOSIS — F32A Depression, unspecified: Secondary | ICD-10-CM

## 2014-11-29 LAB — LIPID PANEL
Cholesterol: 173 mg/dL (ref 125–200)
HDL: 72 mg/dL (ref >=46)
LDL CALC: 71 mg/dL (ref <130)
Total CHOL/HDL Ratio: 2.4 Ratio (ref <=5.0)
Triglycerides: 149 mg/dL (ref <150)
VLDL: 30 mg/dL (ref <30)

## 2014-11-29 LAB — CBC WITH DIFFERENTIAL/PLATELET
BASOS ABS: 0.1 10*3/uL (ref 0.0–0.1)
Basophils Relative: 1 % (ref 0–1)
Eosinophils Absolute: 0.1 10*3/uL (ref 0.0–0.7)
Eosinophils Relative: 2 % (ref 0–5)
HEMATOCRIT: 39.5 % (ref 36.0–46.0)
HEMOGLOBIN: 13.4 g/dL (ref 12.0–15.0)
LYMPHS PCT: 25 % (ref 12–46)
Lymphs Abs: 1.8 10*3/uL (ref 0.7–4.0)
MCH: 30.4 pg (ref 26.0–34.0)
MCHC: 33.9 g/dL (ref 30.0–36.0)
MCV: 89.6 fL (ref 78.0–100.0)
MONO ABS: 0.7 10*3/uL (ref 0.1–1.0)
MPV: 10.2 fL (ref 8.6–12.4)
Monocytes Relative: 9 % (ref 3–12)
NEUTROS ABS: 4.6 10*3/uL (ref 1.7–7.7)
NEUTROS PCT: 63 % (ref 43–77)
Platelets: 275 10*3/uL (ref 150–400)
RBC: 4.41 MIL/uL (ref 3.87–5.11)
RDW: 14.4 % (ref 11.5–15.5)
WBC: 7.3 10*3/uL (ref 4.0–10.5)

## 2014-11-29 LAB — MAGNESIUM: Magnesium: 2 mg/dL (ref 1.5–2.5)

## 2014-11-29 LAB — TSH: TSH: 0.612 u[IU]/mL (ref 0.350–4.500)

## 2014-11-29 LAB — HEPATIC FUNCTION PANEL
ALK PHOS: 77 U/L (ref 33–130)
ALT: 11 U/L (ref 6–29)
AST: 19 U/L (ref 10–35)
Albumin: 4 g/dL (ref 3.6–5.1)
BILIRUBIN DIRECT: 0.1 mg/dL (ref <=0.2)
BILIRUBIN INDIRECT: 0.4 mg/dL (ref 0.2–1.2)
TOTAL PROTEIN: 7 g/dL (ref 6.1–8.1)
Total Bilirubin: 0.5 mg/dL (ref 0.2–1.2)

## 2014-11-29 LAB — BASIC METABOLIC PANEL WITH GFR
BUN: 21 mg/dL (ref 7–25)
CHLORIDE: 99 mmol/L (ref 98–110)
CO2: 30 mmol/L (ref 20–31)
Calcium: 9.7 mg/dL (ref 8.6–10.4)
Creat: 0.98 mg/dL — ABNORMAL HIGH (ref 0.60–0.93)
GFR, EST NON AFRICAN AMERICAN: 55 mL/min — AB (ref >=60)
GFR, Est African American: 63 mL/min (ref >=60)
GLUCOSE: 76 mg/dL (ref 65–99)
POTASSIUM: 4.5 mmol/L (ref 3.5–5.3)
Sodium: 139 mmol/L (ref 135–146)

## 2014-11-29 MED ORDER — FORMOTEROL FUMARATE 20 MCG/2ML IN NEBU: 20.0000 ug | INHALATION_SOLUTION | Freq: Two times a day (BID) | RESPIRATORY_TRACT | Status: DC

## 2014-11-29 NOTE — Progress Notes (Signed)
Patient ID: Kathy Whitaker, female   DOB: 12/27/1933, 81 y.o.   MRN: [id number]   Comprehensive Evaluation,  Examination & Management     This very nice 81 y.o. MWF presents for presents for a comprehensive evaluation, examination and management of multiple medical co-morbidities.  Patient has been followed for HTN, Prediabetes, Hyperlipidemia, Chronic Asthma, Hypothyroid and Vitamin D Deficiency.      Patient is a non-smoker who has hx/o chronic disabling extrinsic asthma and has been followed by Dr Joya Gaskins and patient has maintained functionality on monthly Xolair injections since 2007. In addition she uses daily nebulizer Tx's with Pulmicort & Performist usu 1x and occasionally 2 x /day.       Patient has labile HTN predates circa 2005. Patient's BP has been controlled at home and patient denies any cardiac symptoms as chest pain, palpitations, shortness of breath, dizziness or ankle swelling. She also has CKD 2 with GFR 85 ml/min. Today's BP: 112/66 mmHg      Patient's hyperlipidemia is controlled with diet and medications. Patient denies myalgias or other medication SE's. Last lipids were at goal with  Cholesterol 179; HDL 64; LDL 94; Triglycerides 104 on 03/23/2014.     Patient has prediabetes predating since 2012 with A1c 5.7% and then 6.0% in 2012 and patient denies reactive hypoglycemic symptoms, visual blurring, diabetic polys, or paresthesias. Last A1c was 5.6% on 03/23/2014.     Patient has been on Thyroid replacement since the 1980's . Finally, patient has history of Vitamin D Deficiency and last Vitamin D was 83 in Oct 2015.      Medication Sig   Performist   . budesonide (PULMICORT) 0.25 MG/2ML neb soln Take 2 mLs (0.25 mg total) by nebulization daily. Once daily and will take again if needed  . CALCIUM 500/D 500-200 MG-UNIT Take 1 tablet by mouth. At times  . VITAMIN D  Take 2,000 Units by mouth.  . COMBIVENT RESPIMAT USE 1 INHALATION EVERY 6 HOURS AS NEEDED FOR WHEEZING  . EPINEPHrine  (EPIPEN JR) 0.15 MG/0.3ML injection Inject 0.15 mg into the muscle as needed.    [name] levothyroxine  75 MCG tablet TAKE 1 TABLET DAILY (NEEDS OFFICE VISIT)  . loratadine  10 MG tablet Take 10 mg by mouth daily.    [name] omalizumab (XOLAIR) 150 MG injection Inject 150 mg into the skin every 28 (twenty-eight) days.    [name] sertraline  50 MG tablet TAKE 1 TABLET DAILY  . simvastatin  40 MG tablet TAKE 1 TABLET AT BEDTIME    Allergies  Allergen Reactions  . Codeine Nausea Only  . Propoxyphene N-Acetaminophen Nausea Only  . Shellfish-Derived Products Rash   Past Medical History  Diagnosis Date  . Asthma   . Hyperlipemia   . Hypothyroidism   . Hypertension   . COPD (chronic obstructive pulmonary disease) ([place])   . Prediabetes   . Vitamin D deficiency   . SVD (spontaneous vaginal delivery)     x 2  . Depression    Health Maintenance  Topic Date Due  . DEXA SCAN  10/30/2000  . TETANUS/TDAP  10/10/2014  . INFLUENZA VACCINE  09/05/2015  . ZOSTAVAX  Completed  . PNA vac Low Risk Adult  Completed   Immunization History  Administered Date(s) Administered  . Influenza Whole 10/05/2009, 11/26/2010, 11/05/2011  . Influenza, High Dose Seasonal PF 11/20/2012, 11/22/2013, 11/21/2014  . Pneumococcal Conjugate-13 11/22/2013  . Pneumococcal Polysaccharide-23 09/14/2008  . Td 10/09/2004  . Zoster 02/15/2013   Past Surgical  History  Procedure Laterality Date  . Tubal ligation Bilateral 1970  . Tonsillectomy    . Wisdom tooth extraction    . Dilatation & currettage/hysteroscopy with resectocope N/A 09/10/2013    Procedure: DILATATION & CURETTAGE/HYSTEROSCOPY WITH RESECTOCOPE;  Surgeon: Darlyn Chamber, MD;  Location: [facility];  Service: Gynecology;  Laterality: N/A;   Family History  Problem Relation Age of Onset  . Thyroid disease Mother   . Cancer Brother     Thyroid  . Gout Brother   . Hodgkin's lymphoma Brother    Social History  Substance Use Topics  . Smoking status: Never Smoker   .  Smokeless tobacco: Never Used  . Alcohol Use: Yes     Comment: Wine daily    ROS Constitutional: Denies fever, chills, weight loss/gain, headaches, insomnia,  night sweats, and change in appetite. Does c/o fatigue. Eyes: Denies redness, blurred vision, diplopia, discharge, itchy, watery eyes.  ENT: Denies discharge, congestion, post nasal drip, epistaxis, sore throat, earache, hearing loss, dental pain, Tinnitus, Vertigo, Sinus pain, snoring.  Cardio: Denies chest pain, palpitations, irregular heartbeat, syncope, dyspnea, diaphoresis, orthopnea, PND, claudication, edema Respiratory: denies cough, dyspnea, DOE, pleurisy, hoarseness, laryngitis,infreq  wheezing.  Gastrointestinal: Denies dysphagia, heartburn, reflux, water brash, pain, cramps, nausea, vomiting, bloating, diarrhea, constipation, hematemesis, melena, hematochezia, jaundice, hemorrhoids Genitourinary: Denies dysuria, frequency, urgency, nocturia, hesitancy, discharge, hematuria, flank pain Breast: Breast lumps, nipple discharge, bleeding.  Musculoskeletal: Denies arthralgia, myalgia, stiffness, Jt. Swelling, pain, limp, and strain/sprain. Denies falls. Skin: Denies puritis, rash, hives, warts, acne, eczema, changing in skin lesion Neuro: No weakness, tremor, incoordination, spasms, paresthesia, pain Psychiatric: Denies confusion, memory loss, sensory loss. Denies Depression. Endocrine: Denies change in weight, skin, hair change, nocturia, and paresthesia, diabetic polys, visual blurring, hyper / hypo glycemic episodes.  Heme/Lymph: No excessive bleeding, bruising, enlarged lymph nodes.  Physical Exam  BP 112/66 mmHg  Pulse 72  Temp(Src) 97.8 F (36.6 C)  Resp 16  Ht 4' 11.75" (1.518 m)  Wt 136 lb (61.689 kg)  BMI 26.77 kg/m2  General Appearance: Well nourished and in no apparent distress.  Eyes: PERRLA, EOMs, conjunctiva no swelling or erythema, normal fundi and vessels. Sinuses: No frontal/maxillary  tenderness ENT/Mouth: EACs patent / TMs  nl. Nares clear without erythema, swelling, mucoid exudates. Oral hygiene is good. No erythema, swelling, or exudate. Tongue normal, non-obstructing. Tonsils not swollen or erythematous. Hearing normal.  Neck: Supple, thyroid normal. No bruits, nodes or JVD. Respiratory: Respiratory effort normal.  BS equal and clear bilateral without rales, rhonci, wheezing or stridor. Cardio: Heart sounds are normal with regular rate and rhythm and no murmurs, rubs or gallops. Peripheral pulses are normal and equal bilaterally without edema. No aortic or femoral bruits. Chest: symmetric with normal excursions and percussion. Breasts: Symmetric, without lumps, nipple discharge, retractions, or fibrocystic changes.  Abdomen: Flat, soft, with bowel sounds. Nontender, no guarding, rebound, hernias, masses, or organomegaly.  Lymphatics: Non tender without lymphadenopathy.  Musculoskeletal: Full ROM all peripheral extremities, joint stability, 5/5 strength, and normal gait. Skin: Warm and dry without rashes, lesions, cyanosis, clubbing or  ecchymosis.  Neuro: Cranial nerves intact, reflexes equal bilaterally. Normal muscle tone, no cerebellar symptoms. Sensation intact.  Pysch: Alert and oriented X 3, normal affect, Insight and Judgment appropriate.   Assessment and Plan  1. Essential hypertension  - Microalbumin / creatinine urine ratio - EKG 12-Lead - [place], RETROPERITNL ABD,  LTD  2. Hyperlipemia  - Lipid panel  3. Prediabetes  - Hemoglobin A1c - Insulin,  random  4. Vitamin D deficiency  - Vit D  25 hydroxy   5. Hypothyroidism, unspecified hypothyroidism type  - TSH  6. Gastroesophageal reflux disease   7. Extrinsic asthma, mild persistent, uncomplicated   8. BMI 26.78,  adult   9. Screening for rectal cancer  - POC Hemoccult Bld/Stl (3-Cd Home Screen); Future  10. Depression screen   11. At low risk for fall   12. Medication  management  - Urinalysis, Routine w reflex microscopic  - CBC with Differential/Platelet - BASIC METABOLIC PANEL WITH GFR - Hepatic function panel - Magnesium  13. Other abnormal glucose  - Hemoglobin A1c - Insulin, random   Continue prudent diet as discussed, weight control, BP monitoring, regular exercise, and medications. Discussed med's effects and SE's. Screening labs and tests as requested with regular follow-up as recommended.  Over 40 minutes of exam, counseling, chart review was performed.

## 2014-11-29 NOTE — Patient Instructions (Signed)
Recommend Adult Low Dose Aspirin or   coated  Aspirin 81 mg daily   To reduce risk of Colon Cancer 20 %,   Skin Cancer 26 % ,   Melanoma 46%   and   Pancreatic cancer 60%   ++++++++++++++++++++++++++++++++++++++++++++++++++++++  Vitamin D goal   is between 70-100.   Please make sure that you are taking your Vitamin D as directed.   It is very important as a natural anti-inflammatory   helping hair, skin, and nails, as well as reducing stroke and heart attack risk.   It helps your bones and helps with mood.  It also decreases numerous cancer risks so please take it as directed.   Low Vit D is associated with a 200-300% higher risk for CANCER   and 200-300% higher risk for HEART   ATTACK  &  STROKE.   ......................................  It is also associated with higher death rate at younger ages,   autoimmune diseases like Rheumatoid arthritis, Lupus, Multiple Sclerosis.     Also many other serious conditions, like depression, Alzheimer's  Dementia, infertility, muscle aches, fatigue, fibromyalgia - just to name a few.  ++++++++++++++++++++++++++++++++++++++++++++++++  Recommend the book "The END of DIETING" by Dr Joel Fuhrman   & the book "The END of DIABETES " by Dr Joel Fuhrman  At [employer] - get book & Audio CD's     Being diabetic has a  300% increased risk for heart attack, stroke, cancer, and alzheimer- type vascular dementia. It is very important that you work harder with diet by avoiding all foods that are white. Avoid white rice (brown & wild rice is OK), white potatoes (sweetpotatoes in moderation is OK), White bread or wheat bread or anything made out of white flour like bagels, donuts, rolls, buns, biscuits, cakes, pastries, cookies, pizza crust, and pasta (made from white flour & egg whites) - vegetarian pasta or spinach or wheat pasta is OK. Multigrain breads like Arnold's or [employer], or multigrain sandwich thins or flatbreads.  Diet,  exercise and weight loss can reverse and cure diabetes in the early stages.  Diet, exercise and weight loss is very important in the control and prevention of complications of diabetes which affects every system in your body, ie. Brain - dementia/stroke, eyes - glaucoma/blindness, heart - heart attack/heart failure, kidneys - dialysis, stomach - gastric paralysis, intestines - malabsorption, nerves - severe painful neuritis, circulation - gangrene & loss of a leg(s), and finally cancer and Alzheimers.    I recommend avoid fried & greasy foods,  sweets/candy, white rice (brown or wild rice or Quinoa is OK), white potatoes (sweet potatoes are OK) - anything made from white flour - bagels, doughnuts, rolls, buns, biscuits,white and wheat breads, pizza crust and traditional pasta made of white flour & egg white(vegetarian pasta or spinach or wheat pasta is OK).  Multi-grain bread is OK - like multi-grain flat bread or sandwich thins. Avoid alcohol in excess. Exercise is also important.    Eat all the vegetables you want - avoid meat, especially red meat and dairy - especially cheese.  Cheese is the most concentrated form of trans-fats which is the worst thing to clog up our arteries. Veggie cheese is OK which can be found in the fresh produce section at [employer] or [employer] or [employer]  ++++++++++++++++++++++++++++++++++++++++++++++++++ DASH Eating Plan  DASH stands for "Dietary Approaches to Stop Hypertension."   The DASH eating plan is a healthy eating plan that has been shown to reduce high   blood pressure (hypertension). Additional health benefits may include reducing the risk of type 2 diabetes mellitus, heart disease, and stroke. The DASH eating plan may also help with weight loss.  WHAT DO I NEED TO KNOW ABOUT THE DASH EATING PLAN? For the DASH eating plan, you will follow these general guidelines:  Choose foods with a percent daily value for sodium of less than 5% (as listed on the food  label).  Use salt-free seasonings or herbs instead of table salt or sea salt.  Check with your health care provider or pharmacist before using salt substitutes.  Eat lower-sodium products, often labeled as "lower sodium" or "no salt added."  Eat fresh foods.  Eat more vegetables, fruits, and low-fat dairy products.    Choose whole grains. Look for the word "whole" as the first word in the ingredient list.  Choose fish   Limit sweets, desserts, sugars, and sugary drinks.  Choose heart-healthy fats.  Eat veggie cheese   Eat more home-cooked food and less restaurant, buffet, and fast food.  Limit fried foods.  Cook foods using methods other than frying.  Limit canned vegetables. If you do use them, rinse them well to decrease the sodium.  When eating at a restaurant, ask that your food be prepared with less salt, or no salt if possible.                      WHAT FOODS CAN I EAT?  Seek help from a dietitian for individual calorie needs. Grains Whole grain or whole wheat bread. Brown rice. Whole grain or whole wheat pasta. Quinoa, bulgur, and whole grain cereals. Low-sodium cereals. Corn or whole wheat flour tortillas. Whole grain cornbread. Whole grain crackers. Low-sodium crackers.  Vegetables Fresh or frozen vegetables (raw, steamed, roasted, or grilled). Low-sodium or reduced-sodium tomato and vegetable juices. Low-sodium or reduced-sodium tomato sauce and paste. Low-sodium or reduced-sodium canned vegetables.   Fruits All fresh, canned (in natural juice), or frozen fruits.  Meat and Other Protein Products  All fish and seafood.  Dried beans, peas, or lentils. Unsalted nuts and seeds. Unsalted canned beans. Dairy Low-fat dairy products, such as skim or 1% milk, 2% or reduced-fat cheeses, low-fat ricotta or cottage cheese, or plain low-fat yogurt. Low-sodium or reduced-sodium cheeses.  Fats and Oils Tub margarines without trans fats. Light or reduced-fat mayonnaise  and salad dressings (reduced sodium). Avocado. Safflower, olive, or canola oils. Natural peanut or almond butter.  Other Unsalted popcorn and pretzels. The items listed above may not be a complete list of recommended foods or beverages. Contact your dietitian for more options.  +++++++++++++++++++++++++++++++++++++++++++  WHAT FOODS ARE NOT RECOMMENDED?  Grains/ White flour or wheat flour  White bread. White pasta. White rice. Refined cornbread. Bagels and croissants. Crackers that contain trans fat.  Vegetables  Creamed or fried vegetables. Vegetables in a . Regular canned vegetables. Regular canned tomato sauce and paste. Regular tomato and vegetable juices.  Fruits Dried fruits. Canned fruit in light or heavy syrup. Fruit juice.  Meat and Other Protein Products Meat in general. Fatty cuts of meat. Ribs, chicken wings, bacon, sausage, bologna, salami, chitterlings, fatback, hot dogs, bratwurst, and packaged luncheon meats. Salted nuts and seeds. Canned beans with salt.  Dairy Whole or 2% milk, cream, half-and-half, and cream cheese. Whole-fat or sweetened yogurt. Full-fat cheeses or blue cheese. Nondairy creamers and whipped toppings. Processed cheese, cheese spreads, or cheese curds.  Condiments Onion and garlic salt, seasoned salt, table salt, and sea  salt. Canned and packaged gravies. Worcestershire sauce. Tartar sauce. Barbecue sauce. Teriyaki sauce. Soy sauce, including reduced sodium. Steak sauce. Fish sauce. Oyster sauce. Cocktail sauce. Horseradish. Ketchup and mustard. Meat flavorings and tenderizers. Bouillon cubes. Hot sauce. Tabasco sauce. Marinades. Taco seasonings. Relishes.  Fats and Oils Butter, stick margarine, lard, shortening, ghee, and bacon fat. Coconut, palm kernel, or palm oils. Regular salad dressings.  Pickles and olives. Salted popcorn and pretzels. The items listed above may not be a complete list of foods and beverages to avoid.   Preventive Care for  Adults  A healthy lifestyle and preventive care can promote health and wellness. Preventive health guidelines for women include the following key practices.  A routine yearly physical is a good way to check with your health care provider about your health and preventive screening. It is a chance to share any concerns and updates on your health and to receive a thorough exam.  Visit your dentist for a routine exam and preventive care every 6 months. Brush your teeth twice a day and floss once a day. Good oral hygiene prevents tooth decay and gum disease.  The frequency of eye exams is based on your age, health, family medical history, use of contact lenses, and other factors. Follow your health care provider's recommendations for frequency of eye exams.  Eat a healthy diet. Foods like vegetables, fruits, whole grains, low-fat dairy products, and lean protein foods contain the nutrients you need without too many calories. Decrease your intake of foods high in solid fats, added sugars, and salt. Eat the right amount of calories for you.Get information about a proper diet from your health care provider, if necessary.  Regular physical exercise is one of the most important things you can do for your health. Most adults should get at least 150 minutes of moderate-intensity exercise (any activity that increases your heart rate and causes you to sweat) each week. In addition, most adults need muscle-strengthening exercises on 2 or more days a week.  Maintain a healthy weight. The body mass index (BMI) is a screening tool to identify possible weight problems. It provides an estimate of body fat based on height and weight. Your health care provider can find your BMI and can help you achieve or maintain a healthy weight.For adults 20 years and older:  A BMI below 18.5 is considered underweight.  A BMI of 18.5 to 24.9 is normal.  A BMI of 25 to 29.9 is considered overweight.  A BMI of 30 and above is  considered obese.  Maintain normal blood lipids and cholesterol levels by exercising and minimizing your intake of saturated fat. Eat a balanced diet with plenty of fruit and vegetables. If your lipid or cholesterol levels are high, you are over 50, or you are at high risk for heart disease, you may need your cholesterol levels checked more frequently.Ongoing high lipid and cholesterol levels should be treated with medicines if diet and exercise are not working.  If you smoke, find out from your health care provider how to quit. If you do not use tobacco, do not start.  Lung cancer screening is recommended for adults aged 65-80 years who are at high risk for developing lung cancer because of a history of smoking. A yearly low-dose CT scan of the lungs is recommended for people who have at least a 30-pack-year history of smoking and are a current smoker or have quit within the past 15 years. A pack year of smoking is smoking an  average of 1 pack of cigarettes a day for 1 year (for example: 1 pack a day for 30 years or 2 packs a day for 15 years). Yearly screening should continue until the smoker has stopped smoking for at least 15 years. Yearly screening should be stopped for people who develop a health problem that would prevent them from having lung cancer treatment.  Avoid use of street drugs. Do not share needles with anyone. Ask for help if you need support or instructions about stopping the use of drugs.  High blood pressure causes heart disease and increases the risk of stroke.  Ongoing high blood pressure should be treated with medicines if weight loss and exercise do not work.  If you are 47-85 years old, ask your health care provider if you should take aspirin to prevent strokes.  Diabetes screening involves taking a blood sample to check your fasting blood sugar level. This should be done once every 3 years, after age 40, if you are within normal weight and without risk factors for diabetes.  Testing should be considered at a younger age or be carried out more frequently if you are overweight and have at least 1 risk factor for diabetes.  Breast cancer screening is essential preventive care for women. You should practice "breast self-awareness." This means understanding the normal appearance and feel of your breasts and may include breast self-examination. Any changes detected, no matter how small, should be reported to a health care provider. Women in their 50s and 30s should have a clinical breast exam (CBE) by a health care provider as part of a regular health exam every 1 to 3 years. After age 27, women should have a CBE every year. Starting at age 78, women should consider having a mammogram (breast X-ray test) every year. Women who have a family history of breast cancer should talk to their health care provider about genetic screening. Women at a high risk of breast cancer should talk to their health care providers about having an MRI and a mammogram every year.  Breast cancer gene (BRCA)-related cancer risk assessment is recommended for women who have family members with BRCA-related cancers. BRCA-related cancers include breast, ovarian, tubal, and peritoneal cancers. Having family members with these cancers may be associated with an increased risk for harmful changes (mutations) in the breast cancer genes BRCA1 and BRCA2. Results of the assessment will determine the need for genetic counseling and BRCA1 and BRCA2 testing.  Routine pelvic exams to screen for cancer are no longer recommended for nonpregnant women who are considered low risk for cancer of the pelvic organs (ovaries, uterus, and vagina) and who do not have symptoms. Ask your health care provider if a screening pelvic exam is right for you.  If you have had past treatment for cervical cancer or a condition that could lead to cancer, you need Pap tests and screening for cancer for at least 20 years after your treatment. If Pap  tests have been discontinued, your risk factors (such as having a new sexual partner) need to be reassessed to determine if screening should be resumed. Some women have medical problems that increase the chance of getting cervical cancer. In these cases, your health care provider may recommend more frequent screening and Pap tests.    Colorectal cancer can be detected and often prevented. Most routine colorectal cancer screening begins at the age of 15 years and continues through age 12 years. However, your health care provider may recommend screening at an  earlier age if you have risk factors for colon cancer. On a yearly basis, your health care provider may provide home test kits to check for hidden blood in the stool. Use of a small camera at the end of a tube, to directly examine the colon (sigmoidoscopy or colonoscopy), can detect the earliest forms of colorectal cancer. Talk to your health care provider about this at age 35, when routine screening begins. Direct exam of the colon should be repeated every 5-10 years through age 58 years, unless early forms of pre-cancerous polyps or small growths are found.  Osteoporosis is a disease in which the bones lose minerals and strength with aging. This can result in serious bone fractures or breaks. The risk of osteoporosis can be identified using a bone density scan. Women ages 75 years and over and women at risk for fractures or osteoporosis should discuss screening with their health care providers. Ask your health care provider whether you should take a calcium supplement or vitamin D to reduce the rate of osteoporosis.  Menopause can be associated with physical symptoms and risks. Hormone replacement therapy is available to decrease symptoms and risks. You should talk to your health care provider about whether hormone replacement therapy is right for you.  Use sunscreen. Apply sunscreen liberally and repeatedly throughout the day. You should seek shade  when your shadow is shorter than you. Protect yourself by wearing long sleeves, pants, a wide-brimmed hat, and sunglasses year round, whenever you are outdoors.  Once a month, do a whole body skin exam, using a mirror to look at the skin on your back. Tell your health care provider of new moles, moles that have irregular borders, moles that are larger than a pencil eraser, or moles that have changed in shape or color.  Stay current with required vaccines (immunizations).  Influenza vaccine. All adults should be immunized every year.  Tetanus, diphtheria, and acellular pertussis (Td, Tdap) vaccine. Pregnant women should receive 1 dose of Tdap vaccine during each pregnancy. The dose should be obtained regardless of the length of time since the last dose. Immunization is preferred during the 27th-36th week of gestation. An adult who has not previously received Tdap or who does not know her vaccine status should receive 1 dose of Tdap. This initial dose should be followed by tetanus and diphtheria toxoids (Td) booster doses every 10 years. Adults with an unknown or incomplete history of completing a 3-dose immunization series with Td-containing vaccines should begin or complete a primary immunization series including a Tdap dose. Adults should receive a Td booster every 10 years.    Zoster vaccine. One dose is recommended for adults aged 79 years or older unless certain conditions are present.    Pneumococcal 13-valent conjugate (PCV13) vaccine. When indicated, a person who is uncertain of her immunization history and has no record of immunization should receive the PCV13 vaccine. An adult aged 90 years or older who has certain medical conditions and has not been previously immunized should receive 1 dose of PCV13 vaccine. This PCV13 should be followed with a dose of pneumococcal polysaccharide (PPSV23) vaccine. The PPSV23 vaccine dose should be obtained at least 8 weeks after the dose of PCV13 vaccine.  An adult aged 89 years or older who has certain medical conditions and previously received 1 or more doses of PPSV23 vaccine should receive 1 dose of PCV13. The PCV13 vaccine dose should be obtained 1 or more years after the last PPSV23 vaccine dose.  Pneumococcal polysaccharide (PPSV23) vaccine. When PCV13 is also indicated, PCV13 should be obtained first. All adults aged 38 years and older should be immunized. An adult younger than age 24 years who has certain medical conditions should be immunized. Any person who resides in a nursing home or long-term care facility should be immunized. An adult smoker should be immunized. People with an immunocompromised condition and certain other conditions should receive both PCV13 and PPSV23 vaccines. People with human immunodeficiency virus (HIV) infection should be immunized as soon as possible after diagnosis. Immunization during chemotherapy or radiation therapy should be avoided. Routine use of PPSV23 vaccine is not recommended for American Indians, [place]atives, or people younger than 65 years unless there are medical conditions that require PPSV23 vaccine. When indicated, people who have unknown immunization and have no record of immunization should receive PPSV23 vaccine. One-time revaccination 5 years after the first dose of PPSV23 is recommended for people aged 19-64 years who have chronic kidney failure, nephrotic syndrome, asplenia, or immunocompromised conditions. People who received 1-2 doses of PPSV23 before age 62 years should receive another dose of PPSV23 vaccine at age 66 years or later if at least 5 years have passed since the previous dose. Doses of PPSV23 are not needed for people immunized with PPSV23 at or after age 26 years.   Preventive Services / Frequency  Ages 52 years and over  Blood pressure check.  Lipid and cholesterol check.  Lung cancer screening. / Every year if you are aged 29-80 years and have a 30-pack-year history of  smoking and currently smoke or have quit within the past 15 years. Yearly screening is stopped once you have quit smoking for at least 15 years or develop a health problem that would prevent you from having lung cancer treatment.  Clinical breast exam.** / Every year after age 30 years.  BRCA-related cancer risk assessment.** / For women who have family members with a BRCA-related cancer (breast, ovarian, tubal, or peritoneal cancers).  Mammogram.** / Every year beginning at age 41 years and continuing for as long as you are in good health. Consult with your health care provider.  Pap test.** / Every 3 years starting at age 67 years through age 49 or 56 years with 3 consecutive normal Pap tests. Testing can be stopped between 65 and 70 years with 3 consecutive normal Pap tests and no abnormal Pap or HPV tests in the past 10 years.  Fecal occult blood test (FOBT) of stool. / Every year beginning at age 15 years and continuing until age 70 years. You may not need to do this test if you get a colonoscopy every 10 years.  Flexible sigmoidoscopy or colonoscopy.** / Every 5 years for a flexible sigmoidoscopy or every 10 years for a colonoscopy beginning at age 71 years and continuing until age 79 years.  Hepatitis C blood test.** / For all people born from [birth year] through 1963 and any individual with known risks for hepatitis C.  Osteoporosis screening.** / A one-time screening for women ages 81 years and over and women at risk for fractures or osteoporosis.  Skin self-exam. / Monthly.  Influenza vaccine. / Every year.  Tetanus, diphtheria, and acellular pertussis (Tdap/Td) vaccine.** / 1 dose of Td every 10 years.  Zoster vaccine.** / 1 dose for adults aged 79 years or older.  Pneumococcal 13-valent conjugate (PCV13) vaccine.** / Consult your health care provider.  Pneumococcal polysaccharide (PPSV23) vaccine.** / 1 dose for all adults aged 22 years and older.  Screening for abdominal aortic  aneurysm (AAA)  by ultrasound is recommended for people who have history of high blood pressure or who are current or former smokers.

## 2014-11-30 ENCOUNTER — Telehealth: Payer: Self-pay

## 2014-11-30 LAB — URINALYSIS, ROUTINE W REFLEX MICROSCOPIC
BILIRUBIN URINE: NEGATIVE
Glucose, UA: NEGATIVE
Hgb urine dipstick: NEGATIVE
Ketones, ur: NEGATIVE
Leukocytes, UA: NEGATIVE
Nitrite: NEGATIVE
Protein, ur: NEGATIVE
SPECIFIC GRAVITY, URINE: 1.01 (ref 1.001–1.035)
pH: 6.5 (ref 5.0–8.0)

## 2014-11-30 LAB — MICROALBUMIN / CREATININE URINE RATIO
Creatinine, Urine: 44 mg/dL (ref 20–320)
Microalb Creat Ratio: 5 mcg/mg creat (ref <30)
Microalb, Ur: 0.2 mg/dL

## 2014-11-30 LAB — HEMOGLOBIN A1C
HEMOGLOBIN A1C: 5.6 % (ref <5.7)
Mean Plasma Glucose: 114 mg/dL (ref <117)

## 2014-11-30 LAB — INSULIN, RANDOM: Insulin: 3.8 u[IU]/mL (ref 2.0–19.6)

## 2014-11-30 LAB — VITAMIN D 25 HYDROXY (VIT D DEFICIENCY, FRACTURES): Vit D, 25-Hydroxy: 39 ng/mL (ref 30–100)

## 2014-11-30 NOTE — Telephone Encounter (Signed)
Xolair Vials: 1 Lot #: [id number] Exp date: 06/2017

## 2014-12-14 ENCOUNTER — Ambulatory Visit: Payer: Medicare Other

## 2014-12-16 ENCOUNTER — Ambulatory Visit (INDEPENDENT_AMBULATORY_CARE_PROVIDER_SITE_OTHER): Payer: Medicare Other

## 2014-12-16 DIAGNOSIS — J4540 Moderate persistent asthma, uncomplicated: Secondary | ICD-10-CM | POA: Diagnosis not present

## 2014-12-16 MED ORDER — OMALIZUMAB 150 MG ~~LOC~~ SOLR
150.0000 mg | Freq: Once | SUBCUTANEOUS | Status: AC
  Administered 2014-12-16: 150 mg via SUBCUTANEOUS

## 2014-12-26 ENCOUNTER — Telehealth: Payer: Self-pay | Admitting: Internal Medicine

## 2014-12-26 NOTE — Telephone Encounter (Signed)
#   Vials:1 Arrival Date:12/26/14 Lot [id number] Exp Date:5/18

## 2015-01-11 ENCOUNTER — Other Ambulatory Visit: Payer: Self-pay | Admitting: *Deleted

## 2015-01-11 MED ORDER — SERTRALINE HCL 50 MG PO TABS: 50.0000 mg | ORAL_TABLET | Freq: Every day | ORAL | Status: DC

## 2015-01-17 ENCOUNTER — Ambulatory Visit: Payer: Medicare Other

## 2015-01-17 ENCOUNTER — Ambulatory Visit: Payer: Medicare Other | Admitting: Internal Medicine

## 2015-01-18 ENCOUNTER — Ambulatory Visit: Payer: Medicare Other

## 2015-01-19 ENCOUNTER — Ambulatory Visit (INDEPENDENT_AMBULATORY_CARE_PROVIDER_SITE_OTHER): Payer: Medicare Other

## 2015-01-19 DIAGNOSIS — J4540 Moderate persistent asthma, uncomplicated: Secondary | ICD-10-CM

## 2015-01-20 MED ORDER — OMALIZUMAB 150 MG ~~LOC~~ SOLR
150.0000 mg | Freq: Once | SUBCUTANEOUS | Status: AC
  Administered 2015-01-20: 150 mg via SUBCUTANEOUS

## 2015-01-23 ENCOUNTER — Other Ambulatory Visit: Payer: Self-pay | Admitting: Critical Care Medicine

## 2015-01-31 ENCOUNTER — Other Ambulatory Visit: Payer: Self-pay | Admitting: *Deleted

## 2015-01-31 MED ORDER — IPRATROPIUM-ALBUTEROL 20-100 MCG/ACT IN AERS: INHALATION_SPRAY | RESPIRATORY_TRACT | Status: AC

## 2015-01-31 NOTE — Telephone Encounter (Signed)
Rx request from [facility] to refill Combivent Respimat Inhaler Rx refilled. Nothing further needed.

## 2015-02-03 ENCOUNTER — Encounter: Payer: Self-pay | Admitting: *Deleted

## 2015-02-08 ENCOUNTER — Telehealth: Payer: Self-pay | Admitting: Internal Medicine

## 2015-02-08 NOTE — Telephone Encounter (Signed)
#   vials:1 Ordered date:02/08/15 Shipping Date:02/09/15

## 2015-02-14 NOTE — Telephone Encounter (Signed)
#   Vials:1 Arrival Date:02/14/15 Lot [id number] Exp Date:6/20

## 2015-02-21 ENCOUNTER — Ambulatory Visit: Payer: Medicare Other

## 2015-02-22 ENCOUNTER — Ambulatory Visit (INDEPENDENT_AMBULATORY_CARE_PROVIDER_SITE_OTHER): Payer: Medicare Other

## 2015-02-22 DIAGNOSIS — J4540 Moderate persistent asthma, uncomplicated: Secondary | ICD-10-CM

## 2015-02-22 MED ORDER — OMALIZUMAB 150 MG ~~LOC~~ SOLR
150.0000 mg | Freq: Once | SUBCUTANEOUS | Status: AC
  Administered 2015-02-22: 150 mg via SUBCUTANEOUS

## 2015-03-08 ENCOUNTER — Telehealth: Payer: Self-pay | Admitting: Internal Medicine

## 2015-03-08 ENCOUNTER — Ambulatory Visit: Payer: Self-pay | Admitting: Internal Medicine

## 2015-03-08 NOTE — Telephone Encounter (Signed)
#   vials:1 Ordered date:03/08/15 Shipping Date:03/09/15

## 2015-03-09 NOTE — Telephone Encounter (Signed)
#   Vials:1 Arrival Date:03/09/15 Lot [id number] Exp Date:6/20

## 2015-03-29 ENCOUNTER — Ambulatory Visit (INDEPENDENT_AMBULATORY_CARE_PROVIDER_SITE_OTHER): Payer: Medicare Other

## 2015-03-29 DIAGNOSIS — J4540 Moderate persistent asthma, uncomplicated: Secondary | ICD-10-CM | POA: Diagnosis not present

## 2015-03-30 ENCOUNTER — Other Ambulatory Visit: Payer: Self-pay | Admitting: Internal Medicine

## 2015-03-30 MED ORDER — OMALIZUMAB 150 MG ~~LOC~~ SOLR
150.0000 mg | Freq: Once | SUBCUTANEOUS | Status: AC
  Administered 2015-03-29: 150 mg via SUBCUTANEOUS

## 2015-04-17 ENCOUNTER — Encounter: Payer: Self-pay | Admitting: *Deleted

## 2015-04-20 ENCOUNTER — Telehealth: Payer: Self-pay | Admitting: Internal Medicine

## 2015-04-20 NOTE — Telephone Encounter (Signed)
#   vials:1 Ordered date:04/20/15 Shipping Date:04/21/15

## 2015-04-21 NOTE — Telephone Encounter (Signed)
#   Vials:1 Arrival [id number] Lot [id number] Exp Date:9/20

## 2015-04-30 ENCOUNTER — Other Ambulatory Visit: Payer: Self-pay | Admitting: Internal Medicine

## 2015-05-02 ENCOUNTER — Ambulatory Visit (INDEPENDENT_AMBULATORY_CARE_PROVIDER_SITE_OTHER): Payer: Medicare Other

## 2015-05-02 ENCOUNTER — Ambulatory Visit (INDEPENDENT_AMBULATORY_CARE_PROVIDER_SITE_OTHER): Payer: Medicare Other | Admitting: Internal Medicine

## 2015-05-02 ENCOUNTER — Encounter: Payer: Self-pay | Admitting: Internal Medicine

## 2015-05-02 VITALS — BP 112/58 | HR 69 | Temp 98.2°F | Ht 59.75 in | Wt 136.0 lb

## 2015-05-02 DIAGNOSIS — J4540 Moderate persistent asthma, uncomplicated: Secondary | ICD-10-CM | POA: Diagnosis not present

## 2015-05-02 NOTE — Progress Notes (Signed)
Subjective:     Patient ID: Kathy Whitaker, female   DOB: January 27, 1934,   MRN: [id number]  HPI  39 yowf never smoker with h/o allergic rhinitis/ asthma dating back to  Her late 19s and much better on xolair/performist/budesonide previously under Dr Kathy Whitaker care   05/02/2015 1st   office visit/ Kathy Whitaker / transition of care from Dr Kathy Whitaker   Chief Complaint  Patient presents with  . Follow-up    Former PW pt. Pt c/o occasional SOB with exertion. Pt states that her breathing is doing well. She recently had a cold but is improving. Pt denies cough/wheeze/CP/tightness. Pt is on Xolair and reports no complications/reactions.   maint rx with performist/budesonide rarely need combivent (very poor hfa noted - see a/p) Says other forms of ICS don't work. Concerned about longterm effects of meds   Not limited by breathing from desired activities    No obvious day to day or daytime variability or assoc excess/ purulent sputum or mucus plugs or hemoptysis or cp or chest tightness, subjective wheeze or overt sinus or hb symptoms. No unusual exp hx or h/o childhood pna/ asthma or knowledge of premature birth.  Sleeping ok without nocturnal  or early am exacerbation  of respiratory  c/o's or need for noct saba. Also denies any obvious fluctuation of symptoms with weather or environmental changes or other aggravating or alleviating factors except as outlined above   Current Medications, Allergies, Complete Past Medical History, Past Surgical History, Family History, and Social History were reviewed in [employer] record.  ROS  The following are not active complaints unless bolded sore throat, dysphagia, dental problems, itching, sneezing,  nasal congestion or excess/ purulent secretions, ear ache,   fever, chills, sweats, unintended wt loss, classically pleuritic or exertional cp,  orthopnea pnd or leg swelling, presyncope, palpitations, abdominal pain, anorexia, nausea, vomiting, diarrhea  or  change in bowel or bladder habits, change in stools or urine, dysuria,hematuria,  rash, arthralgias, visual complaints, headache, numbness, weakness or ataxia or problems with walking or coordination,  change in mood/affect or memory.             Review of Systems     Objective:   Physical Exam amb pleasant wf nad  Wt Readings from Last 3 Encounters:  05/02/15 136 lb (61.689 kg)  11/29/14 136 lb (61.689 kg)  03/23/14 138 lb (62.596 kg)    Vital signs reviewed  HEENT: nl dentition, turbinates, and oropharynx. Nl external ear canals without cough reflex   NECK :  without JVD/Nodes/TM/ nl carotid upstrokes bilaterally   LUNGS: no acc muscle use,  Nl contour chest which is clear to A and P bilaterally without cough on insp or exp maneuvers   CV:  RRR  no s3 or murmur or increase in P2, no edema   ABD:  soft and nontender with nl inspiratory excursion in the supine position. No bruits or organomegaly, bowel sounds nl  MS:  Nl gait/ ext warm without deformities, calf tenderness, cyanosis or clubbing No obvious joint restrictions   SKIN: warm and dry without lesions    NEURO:  alert, approp, nl sensorium with  no motor deficits         Assessment:

## 2015-05-02 NOTE — Patient Instructions (Signed)
Combine your nebulizer solutions and take each am plus second treatment in pm if needed.  Work on inhaler technique:  relax and gently blow all the way out then take a nice smooth deep breath back in, triggering the inhaler at same time you start breathing in.  Hold for up to 5 seconds if you can. Blow out thru nose. Rinse and gargle with water when done  Only use your a combivent as a rescue medication to be used if you can't catch your breath by resting or doing a relaxed purse lip breathing pattern.  - The less you use it, the better it will work when you need it. - Ok to use up to 1 puffs  every 4 hours if you must but call for immediate appointment if use goes up over your usual need - Don't leave home without it !!  (think of it like the spare tire for your car)   Please schedule a follow up visit in 12 months but call sooner if needed  

## 2015-05-03 ENCOUNTER — Encounter: Payer: Self-pay | Admitting: Internal Medicine

## 2015-05-03 MED ORDER — OMALIZUMAB 150 MG ~~LOC~~ SOLR
300.0000 mg | Freq: Once | SUBCUTANEOUS | Status: AC
  Administered 2015-05-02: 300 mg via SUBCUTANEOUS

## 2015-05-03 NOTE — Assessment & Plan Note (Addendum)
Xolair rx since at least 2009 per EMR > try doubling interval per Dr Annamaria Boots rec as of 05/02/2015  05/02/2015  extensive coaching HFA effectiveness =    75% from a baseline of 50%   I had an extended discussion with the patient reviewing all relevant studies (going all the way back to 2009) completed to date and  lasting 25 minutes of a 40  Minute transition of care/ extended office visit    1) the reason her inhalers don't work is that she doesn't use them. I do believe she could be trying but we will need to verify the technique before relying entirely on other forms from her present nebulizer.  2) the sign that medications like Xolair are actually working is that the other medicines that she depends on her reduced. This apparently has not happened to date. I suggested that she at least just reduce the budesonide to morning dosing and if she gets worse certainly go back to twice daily. As long as her insurance continues to pay for this is reasonable to continue the once daily but if not then she should return here for training on how to use HFA more effectively and could be started on Symbicort which is the equivalent of her present nebulizer treatment.  3) Each maintenance medication was reviewed in detail including most importantly the difference between maintenance and prns and under what circumstances the prns are to be triggered using an action plan format that is not reflected in the computer generated alphabetically organized AVS.    Please see instructions for details which were reviewed in writing and the patient given a copy highlighting the part that I personally wrote and discussed at today's ov.

## 2015-05-04 ENCOUNTER — Telehealth: Payer: Self-pay | Admitting: *Deleted

## 2015-05-04 NOTE — Telephone Encounter (Signed)
-----   Message from [name], MD sent at 05/03/2015 10:53 AM EDT ----- Let her know that I discussed her case with Dr. Annamaria Boots and he recommended she double the time between Xolair doses for the next 3 months and then return to see me at that point to regroup and see if further reductions or feasible.

## 2015-05-04 NOTE — Telephone Encounter (Signed)
ATC the pt x 2 and she was unable to hear me due to problem with our phones  Will call back later

## 2015-05-04 NOTE — Telephone Encounter (Signed)
Spoke with the pt and notified of recs per MW  She verbalized understanding  She will call back tomorrow to reschedule xolair appt and ov with MW

## 2015-05-16 ENCOUNTER — Telehealth: Payer: Self-pay | Admitting: Internal Medicine

## 2015-05-16 NOTE — Telephone Encounter (Signed)
#   vials:1 Ordered date:05/16/15 Shipping Date:4/12 or 05/18/15

## 2015-05-23 NOTE — Telephone Encounter (Signed)
#   Vials:1 Arrival Date:05/23/15 Lot [id number] Exp Date:11/20

## 2015-06-02 ENCOUNTER — Ambulatory Visit: Payer: Medicare Other

## 2015-06-05 ENCOUNTER — Ambulatory Visit (INDEPENDENT_AMBULATORY_CARE_PROVIDER_SITE_OTHER): Payer: Medicare Other

## 2015-06-05 ENCOUNTER — Telehealth: Payer: Self-pay | Admitting: Internal Medicine

## 2015-06-05 DIAGNOSIS — J4540 Moderate persistent asthma, uncomplicated: Secondary | ICD-10-CM

## 2015-06-05 MED ORDER — OMALIZUMAB 150 MG ~~LOC~~ SOLR
150.0000 mg | Freq: Once | SUBCUTANEOUS | Status: AC
  Administered 2015-06-05: 150 mg via SUBCUTANEOUS

## 2015-06-05 NOTE — Telephone Encounter (Signed)
Fine with me

## 2015-06-05 NOTE — Telephone Encounter (Signed)
Duplicate message.  See other phone note dated from today regarding xolair changes.  Thanks.

## 2015-06-05 NOTE — Telephone Encounter (Signed)
Called spoke with pt. Informed her of MW's recs. She voiced understanding and had no further questions. I will forward this message to [facility] as FYI.   FYI for [employer]

## 2015-06-05 NOTE — Telephone Encounter (Signed)
Spoke with Mr. Florek make patient aware that we are sending message to MW to advise if he is okay with patient going back on Xolair monthly instead of every 2 months. Pt can tell a difference since getting injections every 2 months.    MW Please advise. Thanks.     ** Stoney Karczewski and Allergy lab will need to be updated of any changes made to patient's Xolair**

## 2015-06-06 ENCOUNTER — Ambulatory Visit (INDEPENDENT_AMBULATORY_CARE_PROVIDER_SITE_OTHER): Payer: Medicare Other | Admitting: Internal Medicine

## 2015-06-06 VITALS — BP 122/70 | HR 72 | Temp 97.5°F | Resp 16 | Ht 59.75 in | Wt 135.4 lb

## 2015-06-06 DIAGNOSIS — I10 Essential (primary) hypertension: Secondary | ICD-10-CM

## 2015-06-06 DIAGNOSIS — E559 Vitamin D deficiency, unspecified: Secondary | ICD-10-CM | POA: Diagnosis not present

## 2015-06-06 DIAGNOSIS — R7303 Prediabetes: Secondary | ICD-10-CM | POA: Diagnosis not present

## 2015-06-06 DIAGNOSIS — Z79899 Other long term (current) drug therapy: Secondary | ICD-10-CM | POA: Diagnosis not present

## 2015-06-06 DIAGNOSIS — E039 Hypothyroidism, unspecified: Secondary | ICD-10-CM | POA: Diagnosis not present

## 2015-06-06 DIAGNOSIS — R7309 Other abnormal glucose: Secondary | ICD-10-CM | POA: Diagnosis not present

## 2015-06-06 DIAGNOSIS — E785 Hyperlipidemia, unspecified: Secondary | ICD-10-CM

## 2015-06-06 LAB — LIPID PANEL
CHOL/HDL RATIO: 2.2 ratio (ref <=5.0)
Cholesterol: 164 mg/dL (ref 125–200)
HDL: 75 mg/dL (ref >=46)
LDL CALC: 71 mg/dL (ref <130)
Triglycerides: 92 mg/dL (ref <150)
VLDL: 18 mg/dL (ref <30)

## 2015-06-06 LAB — CBC WITH DIFFERENTIAL/PLATELET
Basophils Absolute: 65 cells/uL (ref 0–200)
Basophils Relative: 1 %
EOS ABS: 130 {cells}/uL (ref 15–500)
Eosinophils Relative: 2 %
HEMATOCRIT: 41.7 % (ref 35.0–45.0)
Hemoglobin: 13.4 g/dL (ref 11.7–15.5)
LYMPHS PCT: 28 %
Lymphs Abs: 1820 cells/uL (ref 850–3900)
MCH: 29.5 pg (ref 27.0–33.0)
MCHC: 32.1 g/dL (ref 32.0–36.0)
MCV: 91.9 fL (ref 80.0–100.0)
MONO ABS: 520 {cells}/uL (ref 200–950)
MPV: 10.9 fL (ref 7.5–12.5)
Monocytes Relative: 8 %
NEUTROS PCT: 61 %
Neutro Abs: 3965 cells/uL (ref 1500–7800)
Platelets: 275 10*3/uL (ref 140–400)
RBC: 4.54 MIL/uL (ref 3.80–5.10)
RDW: 14.1 % (ref 11.0–15.0)
WBC: 6.5 10*3/uL (ref 3.8–10.8)

## 2015-06-06 LAB — BASIC METABOLIC PANEL WITH GFR
BUN: 14 mg/dL (ref 7–25)
CALCIUM: 9.8 mg/dL (ref 8.6–10.4)
CO2: 27 mmol/L (ref 20–31)
Chloride: 103 mmol/L (ref 98–110)
Creat: 0.77 mg/dL (ref 0.60–0.93)
GFR, EST AFRICAN AMERICAN: 85 mL/min (ref >=60)
GFR, Est Non African American: 74 mL/min (ref >=60)
GLUCOSE: 121 mg/dL — AB (ref 65–99)
Potassium: 4.7 mmol/L (ref 3.5–5.3)
Sodium: 139 mmol/L (ref 135–146)

## 2015-06-06 LAB — TSH: TSH: 0.24 mIU/L — ABNORMAL LOW

## 2015-06-06 LAB — HEPATIC FUNCTION PANEL
ALK PHOS: 74 U/L (ref 33–130)
ALT: 12 U/L (ref 6–29)
AST: 20 U/L (ref 10–35)
Albumin: 4.2 g/dL (ref 3.6–5.1)
BILIRUBIN INDIRECT: 0.4 mg/dL (ref 0.2–1.2)
Bilirubin, Direct: 0.1 mg/dL (ref <=0.2)
TOTAL PROTEIN: 6.7 g/dL (ref 6.1–8.1)
Total Bilirubin: 0.5 mg/dL (ref 0.2–1.2)

## 2015-06-06 LAB — MAGNESIUM: Magnesium: 2 mg/dL (ref 1.5–2.5)

## 2015-06-06 LAB — HEMOGLOBIN A1C
Hgb A1c MFr Bld: 5.7 % — ABNORMAL HIGH (ref <5.7)
MEAN PLASMA GLUCOSE: 117 mg/dL

## 2015-06-06 NOTE — Patient Instructions (Signed)

## 2015-06-07 LAB — VITAMIN D 25 HYDROXY (VIT D DEFICIENCY, FRACTURES): Vit D, 25-Hydroxy: 41 ng/mL (ref 30–100)

## 2015-06-07 LAB — INSULIN, RANDOM: INSULIN: 18 u[IU]/mL (ref 2.0–19.6)

## 2015-06-09 ENCOUNTER — Encounter: Payer: Self-pay | Admitting: Internal Medicine

## 2015-06-09 NOTE — Progress Notes (Signed)
Patient ID: Kathy Whitaker, female   DOB: December 27, 1933, 82 y.o.   MRN: [id number]   [facility]                        [name], M.D.        Uvaldo Bristle. Silverio Lay, P.A.-C       Starlyn Skeans, P.A.-C   [facility]                [address] [place], [place] [id number] Telephone [phone] Telefax [phone] _________________________________________________________________________     This very nice 82 y.o. MWF presents for  follow up with Labile  Hypertension, Hyperlipidemia, Pre-Diabetes and Vitamin D Deficiency. Patient has allergic Asthma and has been on Xolar injections since 2007 and is followed at [facility].      Patient is followed expectantly for labile HTN & BP has been controlled at home. Today's BP: 122/70 mmHg. Patient has had no complaints of any cardiac type chest pain, palpitations, dyspnea/orthopnea/PND, dizziness, claudication, or dependent edema.     Hyperlipidemia is controlled with diet & meds. Patient denies myalgias or other med SE's. Last Lipids were at goal with Cholesterol 164; HDL 75; LDL 71; Triglycerides 92 on 06/06/2015.     Also, the patient has history of PreDiabetes since 2012 with A1c 5.7% and has had no symptoms of reactive hypoglycemia, diabetic polys, paresthesias or visual blurring.  Last A1c was 5.7% on 06/06/2015.      Patient has been on Thyroid Replacement since thee 1980's. Further, the patient also has history of Vitamin D Deficiency and supplements vitamin D without any suspected side-effects. Last vitamin D was 41 on 06/06/2015.  Medication Sig  . PULMICORT 0.25 MG/2ML neb soln Take 2 mLs (0.25 mg total) by nebulization daily. Once daily and will take again if needed  . cCALCIUM 500/D 500-200 Take 1 tablet by mouth. At times  . VITAMIN D Take 2,000 Units by mouth.  . EPINEPHrine  0.15 MG/0.3ML inj Inject 0.15 mg into the muscle as needed.    [name]  PERFOROMIST 20 MCG/2ML neb soln Take 2 mLs (20 mcg total) by nebulization 2 (two) times daily.  . COMBIVENT RESPIMAT 20-100  respimat USE 1 INHALATION EVERY 6 HOURS AS NEEDED FOR WHEEZING  . levothyroxine  75 MCG  TAKE 1 TABLET DAILY (NEED OFFICE VISIT)  . loratadine  10 MG  Take 10 mg by mouth daily.    [name] omalizumab (XOLAIR) 150 MG injection Inject 150 mg into the skin every 28 (twenty-eight) days.    [name] sertraline  50 MG  Take 1 tablet (50 mg total) by mouth daily.  . Simvastatin 40 MG TAKE 1 TABLET AT BEDTIME   Allergies  Allergen Reactions  . Codeine Nausea Only  . Propoxyphene N-Acetaminophen Nausea Only  . Shellfish-Derived Products Rash   PMHx:   Past Medical History  Diagnosis Date  . Asthma   . Hyperlipemia   . Hypothyroidism   . Hypertension   . COPD (chronic obstructive pulmonary disease) ([place])   . Prediabetes   . Vitamin D deficiency   . SVD (spontaneous vaginal delivery)     x 2  . Depression    Immunization History  Administered Date(s) Administered  . Influenza Whole 10/05/2009, 11/26/2010, 11/05/2011  . Influenza, High Dose Seasonal PF 11/20/2012, 11/22/2013, 11/21/2014  . Pneumococcal  Conjugate-13 11/22/2013  . Pneumococcal Polysaccharide-23 09/14/2008  . Td 10/09/2004  . Zoster 02/15/2013   Past Surgical History  Procedure Laterality Date  . Tubal ligation Bilateral 1970  . Tonsillectomy    . Wisdom tooth extraction    . Dilatation & currettage/hysteroscopy with resectocope N/A 09/10/2013    Procedure: DILATATION & CURETTAGE/HYSTEROSCOPY WITH RESECTOCOPE;  Surgeon: Darlyn Chamber, MD;  Location: [facility];  Service: Gynecology;  Laterality: N/A;   FHx:    Reviewed / unchanged  SHx:    Reviewed / unchanged  Systems Review:  Constitutional: Denies fever, chills, wt changes, headaches, insomnia, fatigue, night sweats, change in appetite. Eyes: Denies redness, blurred vision, diplopia, discharge, itchy, watery eyes.  ENT: Denies discharge, congestion, post  nasal drip, epistaxis, sore throat, earache, hearing loss, dental pain, tinnitus, vertigo, sinus pain, snoring.  CV: Denies chest pain, palpitations, irregular heartbeat, syncope, dyspnea, diaphoresis, orthopnea, PND, claudication or edema. Respiratory: denies cough, dyspnea, DOE, pleurisy, hoarseness, laryngitis, but has occasional wheezing.  Gastrointestinal: Denies dysphagia, odynophagia, heartburn, reflux, water brash, abdominal pain or cramps, nausea, vomiting, bloating, diarrhea, constipation, hematemesis, melena, hematochezia  or hemorrhoids. Genitourinary: Denies dysuria, frequency, urgency, nocturia, hesitancy, discharge, hematuria or flank pain. Musculoskeletal: Denies arthralgias, myalgias, stiffness, jt. swelling, pain, limping or strain/sprain.  Skin: Denies pruritus, rash, hives, warts, acne, eczema or change in skin lesion(s). Neuro: No weakness, tremor, incoordination, spasms, paresthesia or pain. Psychiatric: Denies confusion, memory loss or sensory loss. Endo: Denies change in weight, skin or hair change.  Heme/Lymph: No excessive bleeding, bruising or enlarged lymph nodes.  Physical Exam  BP 122/70 mmHg  Pulse 72  Temp(Src) 97.5 F (36.4 C)  Resp 16  Ht 4' 11.75" (1.518 m)  Wt 135 lb 6.4 oz (61.417 kg)  BMI 26.65 kg/m2  Appears well nourished and in no distress.  Eyes: PERRLA, EOMs, conjunctiva no swelling or erythema. Sinuses: No frontal/maxillary tenderness ENT/Mouth: EAC's clear, TM's nl w/o erythema, bulging. Nares clear w/o erythema, swelling, exudates. Oropharynx clear without erythema or exudates. Oral hygiene is good. Tongue normal, non obstructing. Hearing intact.  Neck: Supple. Thyroid nl. Car 2+/2+ without bruits, nodes or JVD. Chest: Respirations nl with BS clear & equal w/o rales, rhonchi, wheezing or stridor.  Cor: Heart sounds normal w/ regular rate and rhythm without sig. murmurs, gallops, clicks, or rubs. Peripheral pulses normal and equal  without  edema.  Abdomen: Soft & bowel sounds normal. Non-tender w/o guarding, rebound, hernias, masses, or organomegaly.  Lymphatics: Unremarkable.  Musculoskeletal: Full ROM all peripheral extremities, joint stability, 5/5 strength, and normal gait.  Skin: Warm, dry without exposed rashes, lesions or ecchymosis apparent.  Neuro: Cranial nerves intact, reflexes equal bilaterally. Sensory-motor testing grossly intact. Tendon reflexes grossly intact.  Pysch: Alert & oriented x 3.  Insight and judgement nl & appropriate. No ideations.  Assessment and Plan:  1. Essential hypertension  - TSH  2. Hyperlipemia  - Lipid panel - TSH  3. Prediabetes  - Hemoglobin A1c - Insulin, random  4. Vitamin D deficiency  - VITAMIN D 25 Hydroxy   5. Hypothyroidism  - TSH  6. Medication management - CBC with Differential/Platelet - BASIC METABOLIC PANEL WITH GFR - Hepatic function panel - Magnesium   Recommended regular exercise, BP monitoring, weight control, and discussed med and SE's. Recommended labs to assess and monitor clinical status. Further disposition pending results of labs. Over 30 minutes of exam, counseling, chart review was performed

## 2015-06-19 ENCOUNTER — Telehealth: Payer: Self-pay | Admitting: Internal Medicine

## 2015-06-19 NOTE — Telephone Encounter (Signed)
#   vials:1 Ordered date:06/16/15 Per Joellen Jersey ok to wait. Shipping Date:06/20/15

## 2015-06-20 DIAGNOSIS — N958 Other specified menopausal and perimenopausal disorders: Secondary | ICD-10-CM | POA: Diagnosis not present

## 2015-06-20 DIAGNOSIS — Z1231 Encounter for screening mammogram for malignant neoplasm of breast: Secondary | ICD-10-CM | POA: Diagnosis not present

## 2015-06-20 DIAGNOSIS — Z124 Encounter for screening for malignant neoplasm of cervix: Secondary | ICD-10-CM | POA: Diagnosis not present

## 2015-06-20 DIAGNOSIS — Z6826 Body mass index (BMI) 26.0-26.9, adult: Secondary | ICD-10-CM | POA: Diagnosis not present

## 2015-06-20 DIAGNOSIS — M816 Localized osteoporosis [Lequesne]: Secondary | ICD-10-CM | POA: Diagnosis not present

## 2015-06-20 DIAGNOSIS — N3281 Overactive bladder: Secondary | ICD-10-CM | POA: Diagnosis not present

## 2015-06-20 NOTE — Telephone Encounter (Signed)
#   Vials:1 Arrival Date:06/20/15 Lot [id number] Exp Date:12/20

## 2015-06-23 NOTE — Telephone Encounter (Signed)
Alroy Bailiff, please update your Xolair records and order accordingly. Thanks.

## 2015-06-26 NOTE — Telephone Encounter (Signed)
Done

## 2015-07-04 ENCOUNTER — Other Ambulatory Visit: Payer: Self-pay | Admitting: Internal Medicine

## 2015-07-11 ENCOUNTER — Ambulatory Visit (INDEPENDENT_AMBULATORY_CARE_PROVIDER_SITE_OTHER): Payer: Medicare Other

## 2015-07-11 ENCOUNTER — Ambulatory Visit (INDEPENDENT_AMBULATORY_CARE_PROVIDER_SITE_OTHER): Payer: Medicare Other | Admitting: Physician Assistant

## 2015-07-11 ENCOUNTER — Encounter: Payer: Self-pay | Admitting: Physician Assistant

## 2015-07-11 VITALS — BP 112/60 | HR 68 | Temp 98.0°F | Resp 18 | Ht 59.25 in | Wt 134.0 lb

## 2015-07-11 DIAGNOSIS — F32A Depression, unspecified: Secondary | ICD-10-CM

## 2015-07-11 DIAGNOSIS — E785 Hyperlipidemia, unspecified: Secondary | ICD-10-CM

## 2015-07-11 DIAGNOSIS — L272 Dermatitis due to ingested food: Secondary | ICD-10-CM | POA: Diagnosis not present

## 2015-07-11 DIAGNOSIS — J4540 Moderate persistent asthma, uncomplicated: Secondary | ICD-10-CM | POA: Diagnosis not present

## 2015-07-11 DIAGNOSIS — R7303 Prediabetes: Secondary | ICD-10-CM

## 2015-07-11 DIAGNOSIS — E039 Hypothyroidism, unspecified: Secondary | ICD-10-CM | POA: Diagnosis not present

## 2015-07-11 DIAGNOSIS — E559 Vitamin D deficiency, unspecified: Secondary | ICD-10-CM | POA: Diagnosis not present

## 2015-07-11 DIAGNOSIS — Z79899 Other long term (current) drug therapy: Secondary | ICD-10-CM | POA: Diagnosis not present

## 2015-07-11 DIAGNOSIS — K219 Gastro-esophageal reflux disease without esophagitis: Secondary | ICD-10-CM | POA: Diagnosis not present

## 2015-07-11 DIAGNOSIS — L509 Urticaria, unspecified: Secondary | ICD-10-CM

## 2015-07-11 DIAGNOSIS — Z0000 Encounter for general adult medical examination without abnormal findings: Secondary | ICD-10-CM

## 2015-07-11 DIAGNOSIS — Z0001 Encounter for general adult medical examination with abnormal findings: Secondary | ICD-10-CM | POA: Diagnosis not present

## 2015-07-11 DIAGNOSIS — J45901 Unspecified asthma with (acute) exacerbation: Secondary | ICD-10-CM | POA: Diagnosis not present

## 2015-07-11 DIAGNOSIS — F329 Major depressive disorder, single episode, unspecified: Secondary | ICD-10-CM | POA: Diagnosis not present

## 2015-07-11 DIAGNOSIS — I10 Essential (primary) hypertension: Secondary | ICD-10-CM

## 2015-07-11 DIAGNOSIS — R6889 Other general symptoms and signs: Secondary | ICD-10-CM | POA: Diagnosis not present

## 2015-07-11 LAB — CBC WITH DIFFERENTIAL/PLATELET
BASOS PCT: 1 %
Basophils Absolute: 58 cells/uL (ref 0–200)
EOS ABS: 116 {cells}/uL (ref 15–500)
Eosinophils Relative: 2 %
HEMATOCRIT: 41.3 % (ref 35.0–45.0)
HEMOGLOBIN: 13.5 g/dL (ref 11.7–15.5)
LYMPHS ABS: 1624 {cells}/uL (ref 850–3900)
LYMPHS PCT: 28 %
MCH: 29.7 pg (ref 27.0–33.0)
MCHC: 32.7 g/dL (ref 32.0–36.0)
MCV: 90.8 fL (ref 80.0–100.0)
MONO ABS: 522 {cells}/uL (ref 200–950)
MPV: 10.7 fL (ref 7.5–12.5)
Monocytes Relative: 9 %
NEUTROS PCT: 60 %
Neutro Abs: 3480 cells/uL (ref 1500–7800)
Platelets: 277 10*3/uL (ref 140–400)
RBC: 4.55 MIL/uL (ref 3.80–5.10)
RDW: 14.1 % (ref 11.0–15.0)
WBC: 5.8 10*3/uL (ref 3.8–10.8)

## 2015-07-11 LAB — BASIC METABOLIC PANEL WITH GFR
BUN: 12 mg/dL (ref 7–25)
CO2: 27 mmol/L (ref 20–31)
Calcium: 9.8 mg/dL (ref 8.6–10.4)
Chloride: 103 mmol/L (ref 98–110)
Creat: 0.82 mg/dL (ref 0.60–0.93)
GFR, Est African American: 79 mL/min (ref >=60)
GFR, Est Non African American: 68 mL/min (ref >=60)
GLUCOSE: 89 mg/dL (ref 65–99)
POTASSIUM: 4.4 mmol/L (ref 3.5–5.3)
Sodium: 139 mmol/L (ref 135–146)

## 2015-07-11 LAB — TSH: TSH: 11.72 mIU/L — ABNORMAL HIGH

## 2015-07-11 MED ORDER — PREDNISONE 20 MG PO TABS: ORAL_TABLET | ORAL | Status: DC

## 2015-07-11 MED ORDER — AZITHROMYCIN 250 MG PO TABS: ORAL_TABLET | ORAL | Status: AC

## 2015-07-11 MED ORDER — AZITHROMYCIN 250 MG PO TABS: ORAL_TABLET | ORAL | Status: DC

## 2015-07-11 NOTE — Progress Notes (Signed)
Patient ID: Kathy Whitaker, female   DOB: Feb 11, 1933, 82 y.o.   MRN: [id number]  MEDICARE ANNUAL WELLNESS VISIT AND ACUTE VISIT  Assessment:   1. Essential hypertension - CBC with Differential/Platelet - BASIC METABOLIC PANEL WITH GFR  2. Hypothyroidism, unspecified hypothyroidism type - TSH  3. Prediabetes  4. Hyperlipemia  5. Vitamin D deficiency  6. Medication management  7. Gastroesophageal reflux disease, esophagitis presence not specified  8. Extrinsic asthma, moderate persistent, uncomplicated  9. URTICARIA  10. ALLERGY, FOOD  11. Depression, controlled remission  12. Medicare annual wellness visit, subsequent Up to date on MGM, DEXA, PAP, declines another colonoscopy, no injections today since sick  13. Asthma with acute exacerbation, unspecified asthma severity - predniSONE (DELTASONE) 20 MG tablet; 2 tablets daily for 3 days  Dispense: 6 tablet; Refill: 0 - azithromycin (ZITHROMAX) 250 MG tablet; Take 2 tablets (500 mg) on  Day 1,  followed by 1 tablet (250 mg) once daily on Days 2 through 5.  Dispense: 6 each; Refill: 1   Plan:   During the course of the visit the patient was educated and counseled about appropriate screening and preventive services including:    Pneumococcal vaccine   Influenza vaccine  Td vaccine  Screening electrocardiogram  Bone densitometry screening  Colorectal cancer screening  Diabetes screening  Glaucoma screening  Nutrition counseling   Advanced directives: requested  Conditions/risks identified: BMI: Discussed weight loss, diet, and increase physical activity.  Increase physical activity: AHA recommends 150 minutes of physical activity a week.  Medications reviewed PreDiabetes is near goal, ACE/ARB therapy: Not indicated. Urinary Incontinence is not an issue: discussed non pharmacology and pharmacology options.  Fall risk: low- discussed PT, home fall assessment, medications.   Subjective:   Kathy Whitaker is a 82  y.o. WWF who presents for Medicare Annual Wellness Visit and acute visit. Date of last medicare wellness visit was 2015   She has had elevated blood pressure since 2007 and has been monitored expectantly. [name] Her blood pressure has been controlled at home, & today their BP is BP: 112/60 mmHg She does not workout. She denies chest pain,  dizziness.  Patient has history of Asthma/allergies, follows with Dr. [name]. She has been complaining of URI symptoms x 1.5weeks, has been on her pulmicort/performist once a day, she has been using the combivent once a day, and claritin and xolair. Nonproductive cough, no fever or chills, some mild SOB and wheezing.  She is on cholesterol medication and denies myalgias. Her cholesterol is at goal. The cholesterol last visit was:    Lab Results  Component Value Date   CHOL 164 06/06/2015   HDL 75 06/06/2015   LDLCALC 71 06/06/2015   TRIG 92 06/06/2015   CHOLHDL 2.2 06/06/2015   She has had prediabetes for 4 years (2011 with A1c 6.0%). She has been working on diet and exercise for prediabetes, and denies foot ulcerations, hyperglycemia, paresthesia of the feet, polydipsia, polyuria and visual disturbances. Last A1C in the office was:  Lab Results  Component Value Date   HGBA1C 5.7* 06/06/2015   Patient is on Vitamin D supplement.   Lab Results  Component Value Date   VD25OH 41 06/06/2015     She is on thyroid medication. Her medication was changed last visit.   Lab Results  Component Value Date   TSH 0.24* 06/06/2015  .  BMI is Body mass index is 26.84 kg/(m^2)., she is working on diet and exercise. Wt Readings from Last 3 Encounters:  07/11/15 134 lb (60.782 kg)  06/09/15 135 lb 6.4 oz (61.417 kg)  05/02/15 136 lb (61.689 kg)   Names of Other Physician/Practitioners you currently use: 1. [facility] here for primary care 2. Dr Thom Chimes (OD), eye doctor, last visit 2014 DUE 3. Dr Ignatius Specking, dentist, last visit  2016  Patient Care Team: [name], MD as PCP - General (Internal Medicine) Elsie Stain, MD (Pulmonary Disease) Arvella Nigh, MD as Consulting Physician (Obstetrics and Gynecology)  Medication Review:   Medication List       This list is accurate as of: 07/11/15 11:13 AM.  Always use your most recent med list.               budesonide 0.25 MG/2ML nebulizer solution  Commonly known as:  PULMICORT  Take 2 mLs (0.25 mg total) by nebulization daily. Once daily and will take again if needed     CALCIUM 500/D 500-200 MG-UNIT tablet  Generic drug:  calcium-vitamin D  Take 1 tablet by mouth. At times     EPINEPHrine 0.15 MG/0.3ML injection  Commonly known as:  EPIPEN JR  Inject 0.15 mg into the muscle as needed.     formoterol 20 MCG/2ML nebulizer solution  Commonly known as:  PERFOROMIST  Take 2 mLs (20 mcg total) by nebulization 2 (two) times daily.     Ipratropium-Albuterol 20-100 MCG/ACT Aers respimat  Commonly known as:  COMBIVENT RESPIMAT  USE 1 INHALATION EVERY 6 HOURS AS NEEDED FOR WHEEZING     levothyroxine 75 MCG tablet  Commonly known as:  SYNTHROID, LEVOTHROID  TAKE 1 TABLET DAILY (NEED OFFICE VISIT)     loratadine 10 MG tablet  Commonly known as:  CLARITIN  Take 10 mg by mouth daily.     sertraline 50 MG tablet  Commonly known as:  ZOLOFT  Take 1 tablet (50 mg total) by mouth daily.     simvastatin 40 MG tablet  Commonly known as:  ZOCOR  TAKE 1 TABLET AT BEDTIME     VITAMIN D PO  Take 2,000 Units by mouth.     XOLAIR 150 MG injection  Generic drug:  omalizumab  Inject 150 mg into the skin every 28 (twenty-eight) days.        Current Problems (verified) Patient Active Problem List   Diagnosis Date Noted  . BMI 26.78,  adult 11/29/2014  . Medicare annual wellness visit, subsequent 11/29/2014  . Depression, controlled 11/29/2014  . Postmenopausal bleeding 09/10/2013    Class: Hospitalized for  . Essential hypertension 05/21/2013  .  Medication management 05/21/2013  . Prediabetes   . Vitamin D deficiency   . Hypothyroidism   . Hyperlipemia   . ALLERGY, FOOD 04/05/2008  . URTICARIA 03/04/2008  . Extrinsic asthma 05/07/2007  . GERD 11/12/2006   Family History  Problem Relation Age of Onset  . Thyroid disease Mother   . Cancer Brother     Thyroid  . Gout Brother   . Hodgkin's lymphoma Brother    Past Surgical History  Procedure Laterality Date  . Tubal ligation Bilateral 1970  . Tonsillectomy    . Wisdom tooth extraction    . Dilatation & currettage/hysteroscopy with resectocope N/A 09/10/2013    Procedure: DILATATION & CURETTAGE/HYSTEROSCOPY WITH RESECTOCOPE;  Surgeon: Darlyn Chamber, MD;  Location: [facility];  Service: Gynecology;  Laterality: N/A;   , Allergies  Allergen Reactions  . Codeine Nausea Only  . Propoxyphene N-Acetaminophen Nausea Only  . Shellfish-Derived  Products Rash    Screening Tests Immunization History  Administered Date(s) Administered  . Influenza Whole 10/05/2009, 11/26/2010, 11/05/2011  . Influenza, High Dose Seasonal PF 11/20/2012, 11/22/2013, 11/21/2014  . Pneumococcal Conjugate-13 11/22/2013  . Pneumococcal Polysaccharide-23 09/14/2008  . Td 10/09/2004  . Zoster 02/15/2013   Preventative care: Last colonoscopy: 10/2005, will not have another  MGM 06/2014 DEXA 06/2014 PAP 2015 CT chest 2005 CXR 2014 Stress test 2013  Prior vaccinations: TD : 10/09/2004 DUE Influenza: HD 2016  Pneumococcal: 09/14/2008 due for renewal Prevnar: 11/22/2013 Shingles/Zostavax: 12/2012  History reviewed: allergies, current medications, past family history, past medical history, past social history, past surgical history and problem list  Risk Factors: Tobacco Social History  Substance Use Topics  . Smoking status: Never Smoker   . Smokeless tobacco: Never Used  . Alcohol Use: Yes     Comment: Wine daily   She does not smoke.  Patient is not a former smoker. Are there smokers in your  home (other than you)?  No  Alcohol Current alcohol use: glass of wine with dinner  Caffeine Current caffeine use: coffee 1-2 /day  Exercise Current exercise: walking  Nutrition/Diet Current diet: in general, a "healthy" diet    Cardiac risk factors: advanced age (older than 85 for men, 68 for women), dyslipidemia and sedentary lifestyle.  Depression Screen (Note: if answer to either of the following is "Yes", a more complete depression screening is indicated)   Q1: Over the past two weeks, have you felt down, depressed or hopeless? No  Q2: Over the past two weeks, have you felt little interest or pleasure in doing things? No  Have you lost interest or pleasure in daily life? No  Do you often feel hopeless? No  Do you cry easily over simple problems? No  Activities of Daily Living In your present state of health, do you have any difficulty performing the following activities?:  Driving? No Managing money?  No Feeding yourself? No Getting from bed to chair? No Climbing a flight of stairs? No Preparing food and eating?: No Bathing or showering? No Getting dressed: No Getting to the toilet? No Using the toilet:No Moving around from place to place: No In the past year have you fallen or had a near fall?:No  Vision Difficulties: No  Hearing Difficulties: No Do you often ask people to speak up or repeat themselves? No Do you experience ringing or noises in your ears? No Do you have difficulty understanding soft or whispered voices? Sometimes.  Cognition  Do you feel that you have a problem with memory?No  Do you often misplace items? No  Do you feel safe at home?  Yes  Advanced directives Does patient have a [place]? Yes Does patient have a Living Will? Yes   Objective:     Blood pressure 112/60, pulse 68, temperature 98 F (36.7 C), temperature source Temporal, resp. rate 18, height 4' 11.25" (1.505 m), weight 134 lb (60.782 kg), SpO2 96  %. Body mass index is 26.84 kg/(m^2).  General appearance: alert, no distress, WD/WN, female Cognitive Testing  Alert? Yes  Normal Appearance?Yes  Oriented to person? Yes  Place? Yes   Time? Yes  Recall of three objects?  Yes  Can perform simple calculations? Yes  Displays appropriate judgment? Yes  Can read the correct time from a watch/clock?Yes  HEENT: normocephalic, sclerae anicteric, TMs pearly, nares patent, no discharge or erythema, pharynx normal Oral cavity: MMM, no lesions Neck: supple, no lymphadenopathy,  no thyromegaly, no masses Heart: RRR, normal S1, S2, no murmurs Lungs: CTA bilaterally, course breath sounds, no wheezes, rhonchi, or rales Abdomen: +bs, soft, non tender, non distended, no masses, no hepatomegaly, no splenomegaly Musculoskeletal: nontender, no swelling, no obvious deformity Extremities: no edema, no cyanosis, no clubbing Pulses: 2+ symmetric, upper and lower extremities, normal cap refill Neurological: alert, oriented x 3, CN2-12 intact, strength normal upper extremities and lower extremities, sensation normal throughout, DTRs 2+ throughout, no cerebellar signs, gait normal Psychiatric: normal affect, behavior normal, pleasant   Medicare Attestation I have personally reviewed: The patient's medical and social history Their use of alcohol, tobacco or illicit drugs Their current medications and supplements The patient's functional ability including ADLs,fall risks, home safety risks, cognitive, and hearing and visual impairment Diet and physical activities Evidence for depression or mood disorders  The patient's weight, height, BMI, and visual acuity have been recorded in the chart.  I have made referrals, counseling, and provided education to the patient based on review of the above and I have provided the patient with a written personalized care plan for preventive services.    Vicie Mutters, PA-C   07/11/2015

## 2015-07-11 NOTE — Patient Instructions (Addendum)
Do the pulmicort twice a day Do the combivent 2-3 x a day  Will do short burst of prednisone Avoid ABX at this time but if not better in 3-4 days get on zpak given  Please take the prednisone to help decrease inflammation and therefore decrease symptoms. Take it it with food to avoid GI upset. It can cause increased energy but on the other hand it can make it hard to sleep at night so please take it AT [place], it takes 8-12 hours to start working so it will NOT affect your sleeping if you take it at night with your food!!  If you are diabetic it will increase your sugars so decrease carbs and monitor your sugars closely.    Asthma Attack Prevention While you may not be able to control the fact that you have asthma, you can take actions to prevent asthma attacks. The best way to prevent asthma attacks is to maintain good control of your asthma. You can achieve this by:  Taking your medicines as directed.  Avoiding things that can irritate your airways or make your asthma symptoms worse (asthma triggers).  Keeping track of how well your asthma is controlled and of any changes in your symptoms.  Responding quickly to worsening asthma symptoms (asthma attack).  Seeking emergency care when it is needed. WHAT ARE SOME WAYS TO PREVENT AN ASTHMA ATTACK? Have a Plan Work with your health care provider to create a written plan for managing and treating your asthma attacks (asthma action plan). This plan includes:  A list of your asthma triggers and how you can avoid them.  Information on when medicines should be taken and when their dosages should be changed.  The use of a device that measures how well your lungs are working (peak flow meter). Monitor Your Asthma Use your peak flow meter and record your results in a journal every day. A drop in your peak flow numbers on one or more days may indicate the start of an asthma attack. This can happen even before you start to feel symptoms.  You can prevent an asthma attack from getting worse by following the steps in your asthma action plan. Avoid Asthma Triggers Work with your asthma health care provider to find out what your asthma triggers are. This can be done by:  Allergy testing.  Keeping a journal that notes when asthma attacks occur and the factors that may have contributed to them.  Determining if there are other medical conditions that are making your asthma worse. Once you have determined your asthma triggers, take steps to avoid them. This may include avoiding excessive or prolonged exposure to:  Dust. Have someone dust and vacuum your home for you once or twice a week. Using a high-efficiency particulate arrestance (HEPA) vacuum is best.  Smoke. This includes campfire smoke, forest fire smoke, and secondhand smoke from tobacco products.  Pet dander. Avoid contact with animals that you know you are allergic to.  Allergens from trees, grasses or pollens. Avoid spending a lot of time outdoors when pollen counts are high, and on very windy days.  Very cold, dry, or humid air.  Mold.  Foods that contain high amounts of sulfites.  Strong odors.  Outdoor air pollutants, such as Lexicographer.  Indoor air pollutants, such as aerosol sprays and fumes from household cleaners.  Household pests, including dust mites and cockroaches, and pest droppings.  Certain medicines, including NSAIDs. Always talk to your health care provider before  stopping or starting any new medicines. Medicines Take over-the-counter and prescription medicines only as told by your health care provider. Many asthma attacks can be prevented by carefully following your medicine schedule. Taking your medicines correctly is especially important when you cannot avoid certain asthma triggers. Act Quickly If an asthma attack does happen, acting quickly can decrease how severe it is and how long it lasts. Take these steps:   Pay attention to your  symptoms. If you are coughing, wheezing, or having difficulty breathing, do not wait to see if your symptoms go away on their own. Follow your asthma action plan.  If you have followed your asthma action plan and your symptoms are not improving, call your health care provider or seek immediate medical care at the nearest hospital. It is important to note how often you need to use your fast-acting rescue inhaler. If you are using your rescue inhaler more often, it may mean that your asthma is not under control. Adjusting your asthma treatment plan may help you to prevent future asthma attacks and help you to gain better control of your condition. HOW CAN I PREVENT AN ASTHMA ATTACK WHEN I EXERCISE? Follow advice from your health care provider about whether you should use your fast-acting inhaler before exercising. Many people with asthma experience exercise-induced bronchoconstriction (EIB). This condition often worsens during vigorous exercise in cold, humid, or dry environments. Usually, people with EIB can stay very active by pre-treating with a fast-acting inhaler before exercising.   This information is not intended to replace advice given to you by your health care provider. Make sure you discuss any questions you have with your health care provider.   Document Released: 01/09/2009 Document Revised: 10/12/2014 Document Reviewed: 06/23/2014 Elsevier Interactive Patient Education [employer].

## 2015-07-12 MED ORDER — OMALIZUMAB 150 MG ~~LOC~~ SOLR
150.0000 mg | Freq: Once | SUBCUTANEOUS | Status: AC
  Administered 2015-07-11: 150 mg via SUBCUTANEOUS

## 2015-07-17 ENCOUNTER — Telehealth: Payer: Self-pay | Admitting: Internal Medicine

## 2015-07-17 NOTE — Telephone Encounter (Signed)
#   vials:1 Ordered date:07/17/15 Shipping Date:07/18/15

## 2015-07-19 NOTE — Telephone Encounter (Signed)
#   Vials:1 Arrival Date:07/19/15 Lot [id number] Exp Date:12/20

## 2015-07-27 DIAGNOSIS — H524 Presbyopia: Secondary | ICD-10-CM | POA: Diagnosis not present

## 2015-07-27 DIAGNOSIS — H2513 Age-related nuclear cataract, bilateral: Secondary | ICD-10-CM | POA: Diagnosis not present

## 2015-07-27 DIAGNOSIS — H52223 Regular astigmatism, bilateral: Secondary | ICD-10-CM | POA: Diagnosis not present

## 2015-07-27 DIAGNOSIS — H5203 Hypermetropia, bilateral: Secondary | ICD-10-CM | POA: Diagnosis not present

## 2015-08-07 IMAGING — CR DG CHEST 2V
2 series · 2 of 2 positions shown · non-contrast
Comparison: 05/24/2008.

CLINICAL DATA: Shortness of breath. Asthma.

EXAM:
CHEST  2 VIEW

[view not recorded (1 of 2)]
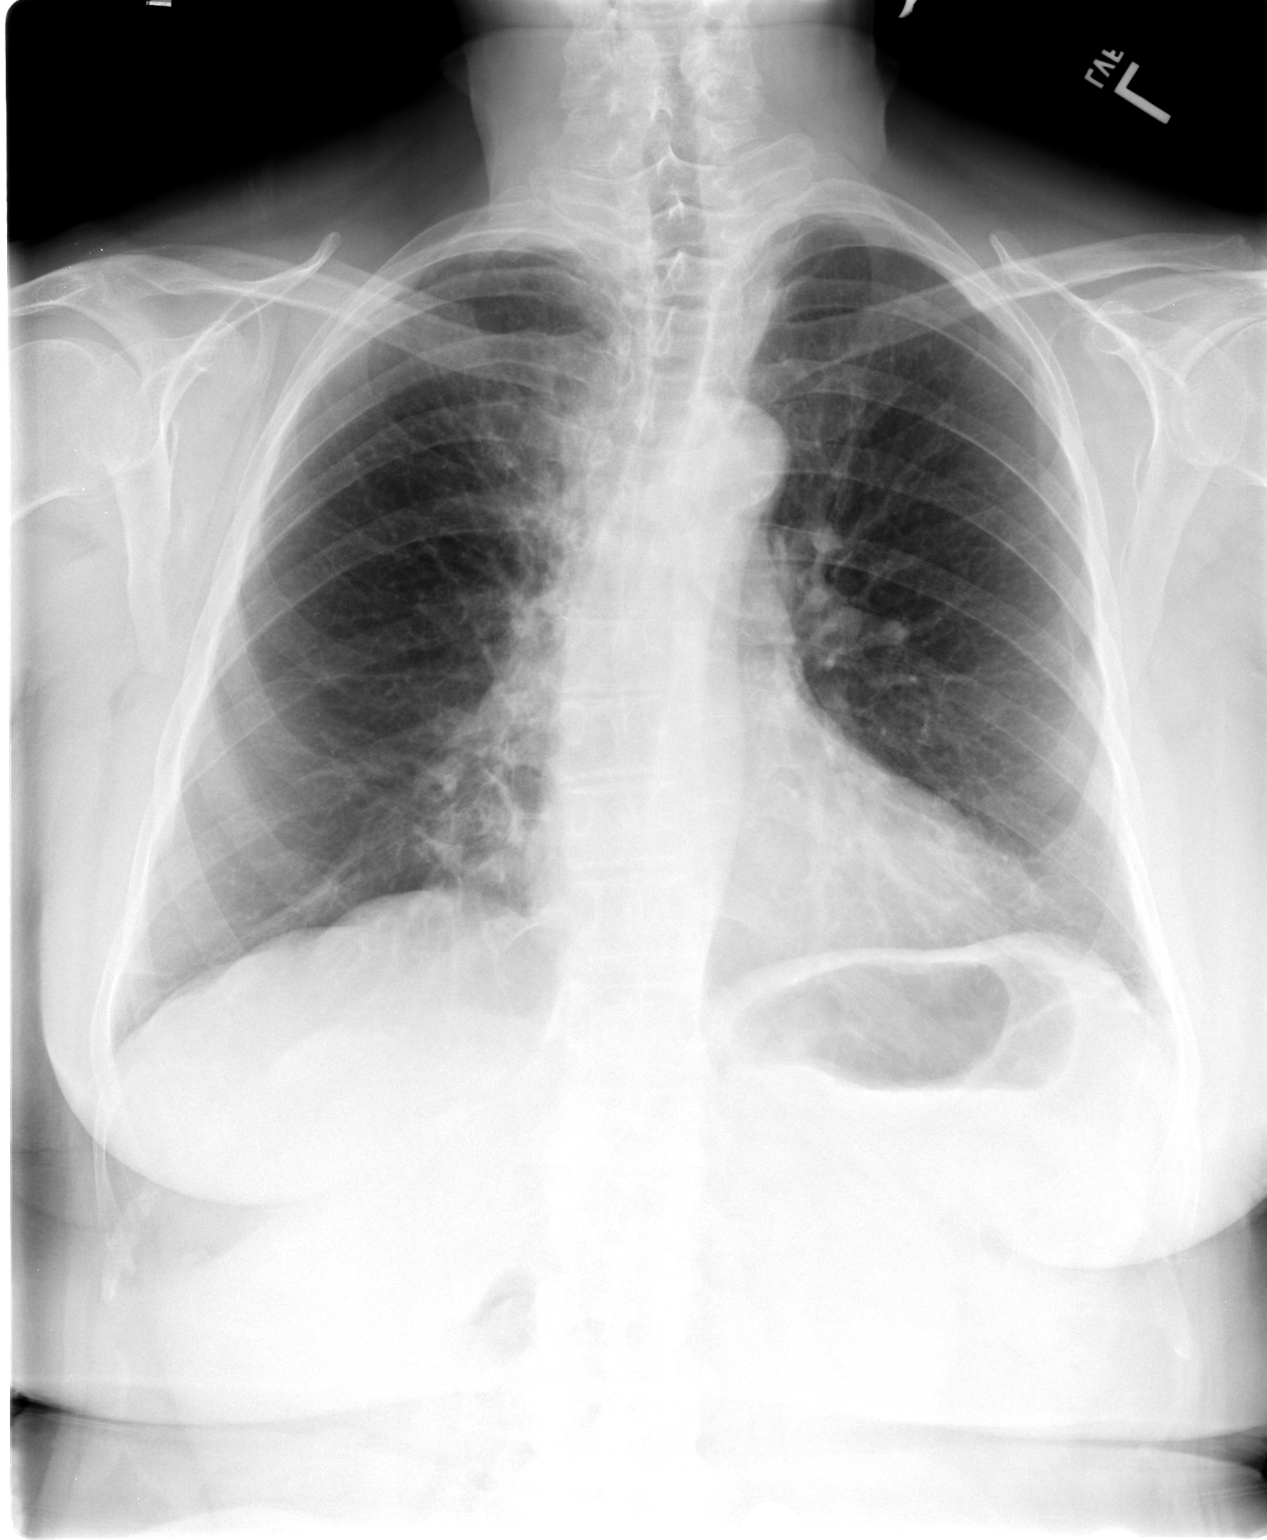

[view not recorded (2 of 2)]
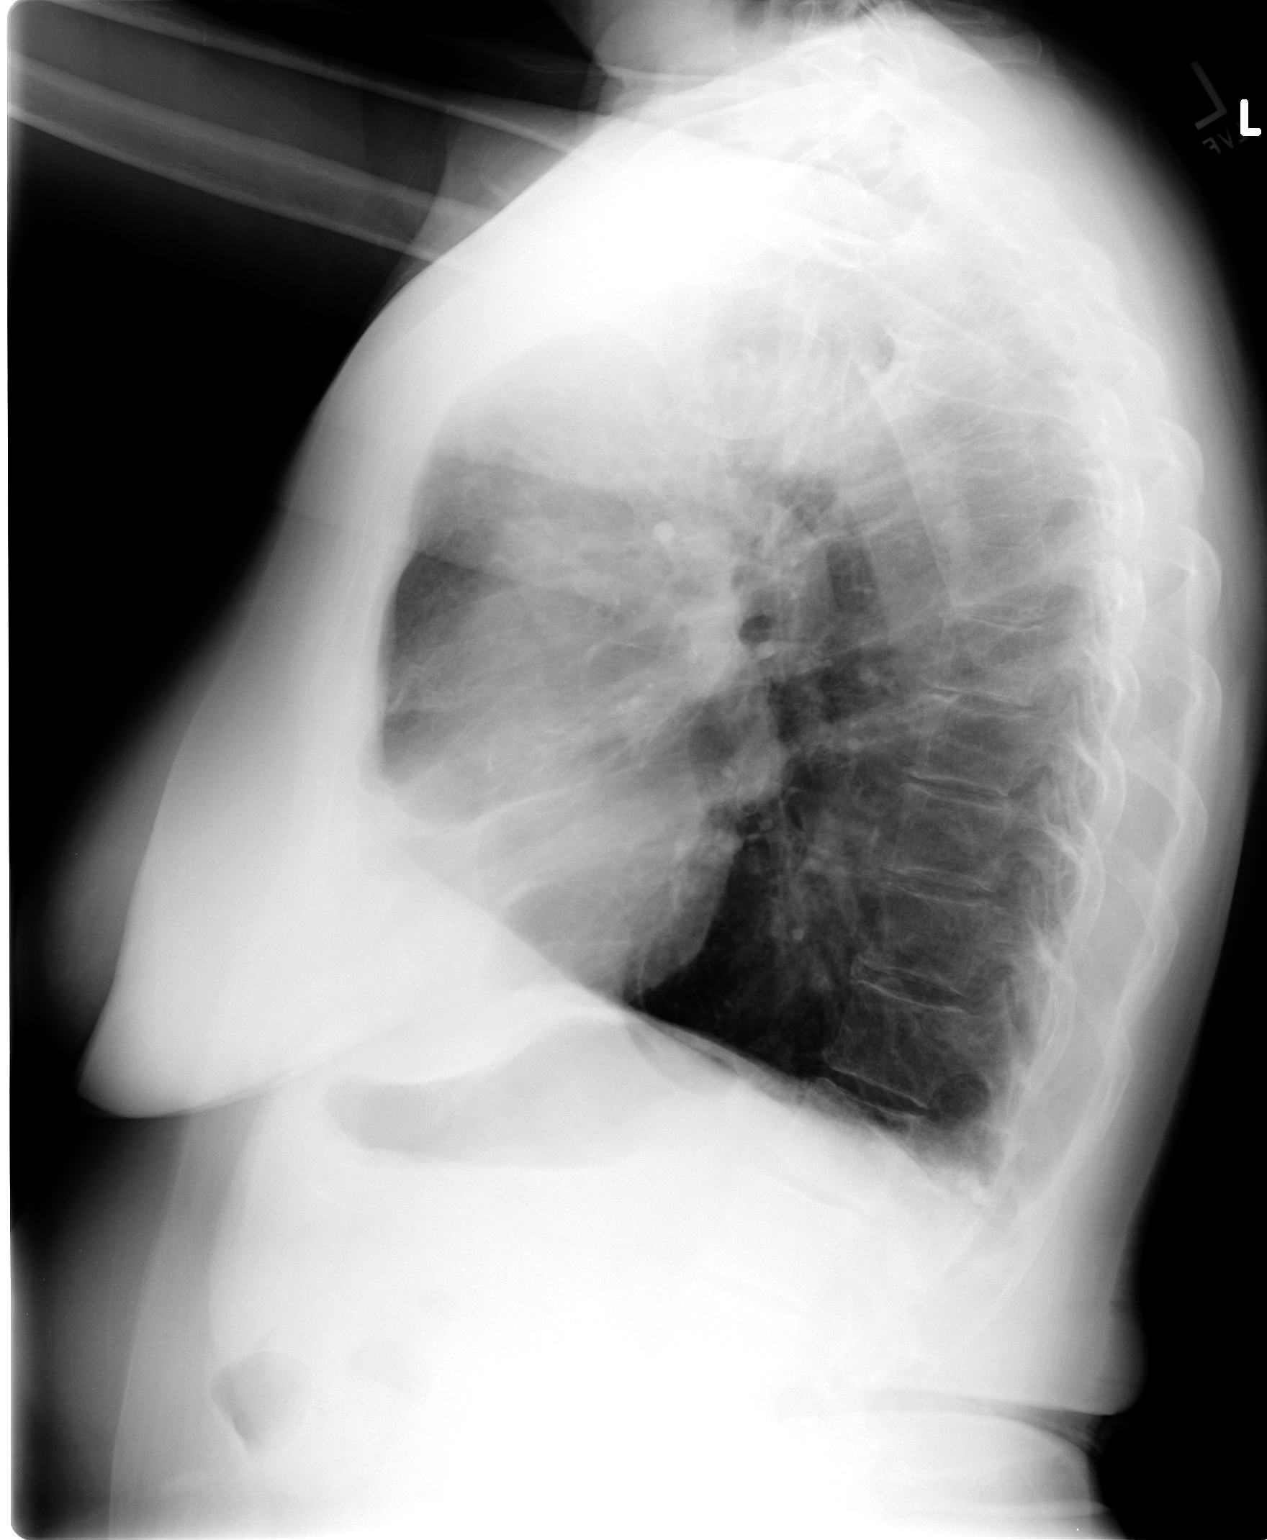

[2 of 2 positions shown; findings below may reference images not displayed]

FINDINGS: Mediastinum and hilar structures are normal. Heart size normal.
Lungs clear. Hyperexpansion of both lung fields suggesting COPD
noted. Pleural parenchymal thickening of the lung bases consistent
with scarring. These findings are stable. No pneumothorax. Heart
size normal. No acute osseous abnormality. Degenerative changes
thoracic spine.
IMPRESSION: COPD and pleural parenchymal scarring. No acute abnormality. Stable
chest from prior study 05/24/2008.

## 2015-08-14 ENCOUNTER — Ambulatory Visit (INDEPENDENT_AMBULATORY_CARE_PROVIDER_SITE_OTHER): Payer: Medicare Other

## 2015-08-14 DIAGNOSIS — J4540 Moderate persistent asthma, uncomplicated: Secondary | ICD-10-CM

## 2015-08-14 MED ORDER — OMALIZUMAB 150 MG ~~LOC~~ SOLR
150.0000 mg | Freq: Once | SUBCUTANEOUS | Status: AC
  Administered 2015-08-14: 150 mg via SUBCUTANEOUS

## 2015-08-17 ENCOUNTER — Telehealth: Payer: Self-pay | Admitting: Internal Medicine

## 2015-08-17 NOTE — Telephone Encounter (Signed)
#   vials:1 Ordered date:08/17/15 Shipping Date:08/17/15

## 2015-08-18 NOTE — Telephone Encounter (Addendum)
#   Vials:1 Arrival Date:08/18/15 Lot [id number] Exp Date:12/20

## 2015-09-04 ENCOUNTER — Other Ambulatory Visit: Payer: Self-pay | Admitting: Internal Medicine

## 2015-09-18 ENCOUNTER — Ambulatory Visit (INDEPENDENT_AMBULATORY_CARE_PROVIDER_SITE_OTHER): Payer: Medicare Other

## 2015-09-18 DIAGNOSIS — J4540 Moderate persistent asthma, uncomplicated: Secondary | ICD-10-CM | POA: Diagnosis not present

## 2015-09-19 MED ORDER — OMALIZUMAB 150 MG ~~LOC~~ SOLR
150.0000 mg | SUBCUTANEOUS | Status: DC
  Administered 2015-09-18: 150 mg via SUBCUTANEOUS

## 2015-10-04 ENCOUNTER — Telehealth: Payer: Self-pay | Admitting: Internal Medicine

## 2015-10-04 NOTE — Telephone Encounter (Signed)
#   vials:1 Ordered date:10/04/15 Shipping Date:10/05/15

## 2015-10-05 NOTE — Telephone Encounter (Signed)
#   Vials:1 Arrival Date:10/05/15 Lot [id number] Exp Date:3/21 Closed by mistake yesterday. (in a hurry)

## 2015-10-23 ENCOUNTER — Ambulatory Visit: Payer: Medicare Other

## 2015-10-25 ENCOUNTER — Ambulatory Visit (INDEPENDENT_AMBULATORY_CARE_PROVIDER_SITE_OTHER): Payer: Medicare Other

## 2015-10-25 DIAGNOSIS — J4540 Moderate persistent asthma, uncomplicated: Secondary | ICD-10-CM | POA: Diagnosis not present

## 2015-10-25 MED ORDER — OMALIZUMAB 150 MG ~~LOC~~ SOLR
150.0000 mg | SUBCUTANEOUS | Status: DC
  Administered 2015-10-25: 150 mg via SUBCUTANEOUS

## 2015-11-09 ENCOUNTER — Telehealth: Payer: Self-pay | Admitting: Internal Medicine

## 2015-11-09 NOTE — Telephone Encounter (Signed)
#   vials:1 Ordered date:11/09/15 Shipping Date:11/10/15

## 2015-11-10 ENCOUNTER — Ambulatory Visit (INDEPENDENT_AMBULATORY_CARE_PROVIDER_SITE_OTHER): Payer: Medicare Other | Admitting: *Deleted

## 2015-11-10 DIAGNOSIS — Z23 Encounter for immunization: Secondary | ICD-10-CM

## 2015-11-10 NOTE — Telephone Encounter (Signed)
#   Vials:1 Arrival Date:11/10/15 Lot [id number] Exp Date:2/21

## 2015-11-22 ENCOUNTER — Other Ambulatory Visit: Payer: Self-pay | Admitting: Internal Medicine

## 2015-11-28 ENCOUNTER — Ambulatory Visit (INDEPENDENT_AMBULATORY_CARE_PROVIDER_SITE_OTHER): Payer: Medicare Other

## 2015-11-28 DIAGNOSIS — J4520 Mild intermittent asthma, uncomplicated: Secondary | ICD-10-CM

## 2015-11-29 MED ORDER — OMALIZUMAB 150 MG ~~LOC~~ SOLR
150.0000 mg | SUBCUTANEOUS | Status: DC
  Administered 2015-11-28: 150 mg via SUBCUTANEOUS

## 2015-12-03 ENCOUNTER — Other Ambulatory Visit: Payer: Self-pay | Admitting: Physician Assistant

## 2015-12-19 ENCOUNTER — Telehealth: Payer: Self-pay | Admitting: Internal Medicine

## 2015-12-19 NOTE — Telephone Encounter (Signed)
#   vials:1 Ordered date:12/19/15 Shipping Date: 12/19/15

## 2015-12-20 NOTE — Telephone Encounter (Signed)
#   Vials:1 Arrival Date:12/20/15 Lot [id number] Exp Date:4/21

## 2015-12-31 ENCOUNTER — Other Ambulatory Visit: Payer: Self-pay | Admitting: Internal Medicine

## 2016-01-01 ENCOUNTER — Ambulatory Visit: Payer: Medicare Other

## 2016-01-02 ENCOUNTER — Encounter: Payer: Self-pay | Admitting: Internal Medicine

## 2016-01-05 ENCOUNTER — Ambulatory Visit (INDEPENDENT_AMBULATORY_CARE_PROVIDER_SITE_OTHER): Payer: Medicare Other

## 2016-01-05 DIAGNOSIS — J4520 Mild intermittent asthma, uncomplicated: Secondary | ICD-10-CM | POA: Diagnosis not present

## 2016-01-08 MED ORDER — OMALIZUMAB 150 MG ~~LOC~~ SOLR
150.0000 mg | SUBCUTANEOUS | Status: DC
  Administered 2016-01-05: 150 mg via SUBCUTANEOUS

## 2016-01-09 ENCOUNTER — Telehealth: Payer: Self-pay | Admitting: Internal Medicine

## 2016-01-09 NOTE — Telephone Encounter (Signed)
#   vials:1 Ordered date:01/09/16 Shipping Date:01/10/16

## 2016-01-10 NOTE — Telephone Encounter (Signed)
#   Vials:1 Arrival Date:01/10/16 Lot [id number] Exp Date:5/21

## 2016-02-08 ENCOUNTER — Ambulatory Visit: Payer: Medicare Other

## 2016-02-09 ENCOUNTER — Ambulatory Visit (INDEPENDENT_AMBULATORY_CARE_PROVIDER_SITE_OTHER): Payer: Medicare Other | Admitting: Internal Medicine

## 2016-02-09 ENCOUNTER — Encounter: Payer: Self-pay | Admitting: Internal Medicine

## 2016-02-09 VITALS — BP 112/76 | HR 64 | Temp 97.3°F | Resp 16 | Ht 59.5 in | Wt 136.4 lb

## 2016-02-09 DIAGNOSIS — R7303 Prediabetes: Secondary | ICD-10-CM | POA: Diagnosis not present

## 2016-02-09 DIAGNOSIS — E782 Mixed hyperlipidemia: Secondary | ICD-10-CM

## 2016-02-09 DIAGNOSIS — E559 Vitamin D deficiency, unspecified: Secondary | ICD-10-CM | POA: Diagnosis not present

## 2016-02-09 DIAGNOSIS — I10 Essential (primary) hypertension: Secondary | ICD-10-CM

## 2016-02-09 DIAGNOSIS — Z79899 Other long term (current) drug therapy: Secondary | ICD-10-CM | POA: Diagnosis not present

## 2016-02-09 DIAGNOSIS — Z136 Encounter for screening for cardiovascular disorders: Secondary | ICD-10-CM | POA: Diagnosis not present

## 2016-02-09 DIAGNOSIS — J4540 Moderate persistent asthma, uncomplicated: Secondary | ICD-10-CM | POA: Diagnosis not present

## 2016-02-09 DIAGNOSIS — Z1212 Encounter for screening for malignant neoplasm of rectum: Secondary | ICD-10-CM

## 2016-02-09 LAB — HEPATIC FUNCTION PANEL
ALBUMIN: 4.3 g/dL (ref 3.6–5.1)
ALT: 12 U/L (ref 6–29)
AST: 20 U/L (ref 10–35)
Alkaline Phosphatase: 61 U/L (ref 33–130)
Bilirubin, Direct: 0.2 mg/dL (ref <=0.2)
Indirect Bilirubin: 0.6 mg/dL (ref 0.2–1.2)
TOTAL PROTEIN: 7 g/dL (ref 6.1–8.1)
Total Bilirubin: 0.8 mg/dL (ref 0.2–1.2)

## 2016-02-09 LAB — CBC WITH DIFFERENTIAL/PLATELET
BASOS ABS: 58 {cells}/uL (ref 0–200)
Basophils Relative: 1 %
EOS ABS: 174 {cells}/uL (ref 15–500)
EOS PCT: 3 %
HCT: 41.7 % (ref 35.0–45.0)
HEMOGLOBIN: 13.6 g/dL (ref 11.7–15.5)
LYMPHS ABS: 1740 {cells}/uL (ref 850–3900)
Lymphocytes Relative: 30 %
MCH: 30.3 pg (ref 27.0–33.0)
MCHC: 32.6 g/dL (ref 32.0–36.0)
MCV: 92.9 fL (ref 80.0–100.0)
MONOS PCT: 11 %
MPV: 10.4 fL (ref 7.5–12.5)
Monocytes Absolute: 638 cells/uL (ref 200–950)
NEUTROS ABS: 3190 {cells}/uL (ref 1500–7800)
NEUTROS PCT: 55 %
Platelets: 257 10*3/uL (ref 140–400)
RBC: 4.49 MIL/uL (ref 3.80–5.10)
RDW: 14.5 % (ref 11.0–15.0)
WBC: 5.8 10*3/uL (ref 3.8–10.8)

## 2016-02-09 LAB — BASIC METABOLIC PANEL WITH GFR
BUN: 15 mg/dL (ref 7–25)
CALCIUM: 9.7 mg/dL (ref 8.6–10.4)
CHLORIDE: 100 mmol/L (ref 98–110)
CO2: 28 mmol/L (ref 20–31)
CREATININE: 0.76 mg/dL (ref 0.60–0.88)
GFR, Est African American: 86 mL/min (ref >=60)
GFR, Est Non African American: 74 mL/min (ref >=60)
GLUCOSE: 90 mg/dL (ref 65–99)
Potassium: 4.3 mmol/L (ref 3.5–5.3)
SODIUM: 137 mmol/L (ref 135–146)

## 2016-02-09 LAB — HEMOGLOBIN A1C
HEMOGLOBIN A1C: 5.2 % (ref <5.7)
MEAN PLASMA GLUCOSE: 103 mg/dL

## 2016-02-09 LAB — TSH: TSH: 2.61 m[IU]/L

## 2016-02-09 LAB — LIPID PANEL
CHOL/HDL RATIO: 2.2 ratio (ref <5.0)
CHOLESTEROL: 175 mg/dL (ref <200)
HDL: 80 mg/dL (ref >50)
LDL Cholesterol: 76 mg/dL (ref <100)
Triglycerides: 97 mg/dL (ref <150)
VLDL: 19 mg/dL (ref <30)

## 2016-02-09 LAB — MAGNESIUM: MAGNESIUM: 1.9 mg/dL (ref 1.5–2.5)

## 2016-02-09 NOTE — Patient Instructions (Signed)

## 2016-02-09 NOTE — Progress Notes (Signed)
[facility] [name], M.D.    Uvaldo Bristle. Silverio Lay, P.A.-C      Starlyn Skeans, P.A.-C  [facility]                [address] [place] [id number] Telephone [phone] Telefax [phone]  Comprehensive Evaluation &  Examination     This very nice 83 y.o. MWF presents for a  comprehensive evaluation and management of multiple medical co-morbidities.  Patient has been followed for HTN, Prediabetes, Hyperlipidemia, Hypothyroidism and Vitamin D Deficiency. Patient is followed byDr [name] for Allergic Asthma and she's been stable for a # of years on Xolair injections (2007).      Patient has hx/o labile HTN predates since 2005. Patient's BP has been controlled at home and patient denies any cardiac symptoms as chest pain, palpitations, shortness of breath, dizziness or ankle swelling.  In  2006 and 201 , she had a negative Stress Myoview. Today's BP is at goal - 112/76.      Patient's hyperlipidemia is controlled with diet and medications. Patient denies myalgias or other medication SE's. Last lipids were at goal:  Lab Results  Component Value Date   CHOL 164 06/06/2015   HDL 75 06/06/2015   LDLCALC 71 06/06/2015   TRIG 92 06/06/2015   CHOLHDL 2.2 06/06/2015      Patient has prediabetes  With A1c 5.7% in 2012  and patient denies reactive hypoglycemic symptoms, visual blurring, diabetic polys, or paresthesias. Last A1c was  Lab Results  Component Value Date   HGBA1C 5.7 (H) 06/06/2015      Patient has been on Thyroid Replacement since the 1980's. Finally, patient has history of Vitamin D Deficiency and last Vitamin D was  Lab Results  Component Value Date   VD25OH 5 06/06/2015   Current Outpatient Prescriptions on File Prior to Visit  Medication Sig  . budesonide (PULMICORT) 0.25 MG/2ML nebulizer solution Take 2 mLs (0.25 mg total) by nebulization daily. Once daily and will take again if  needed  . calcium-vitamin D (CALCIUM 500/D) 500-200 MG-UNIT per tablet Take 1 tablet by mouth. At times  . Cholecalciferol (VITAMIN D PO) Take 2,000 Units by mouth.  . EPINEPHrine (EPIPEN JR) 0.15 MG/0.3ML injection Inject 0.15 mg into the muscle as needed.    . formoterol (PERFOROMIST) 20 MCG/2ML nebulizer solution Take 2 mLs (20 mcg total) by nebulization 2 (two) times daily.  . Ipratropium-Albuterol (COMBIVENT RESPIMAT) 20-100 MCG/ACT AERS respimat USE 1 INHALATION EVERY 6 HOURS AS NEEDED FOR WHEEZING  . levothyroxine (SYNTHROID, LEVOTHROID) 75 MCG tablet TAKE 1 TABLET DAILY (NEED OFFICE VISIT)  . omalizumab (XOLAIR) 150 MG injection Inject 150 mg into the skin every 28 (twenty-eight) days.    [name] sertraline (ZOLOFT) 50 MG tablet TAKE 1 TABLET DAILY  . simvastatin (ZOCOR) 40 MG tablet TAKE 1 TABLET AT BEDTIME  . loratadine (CLARITIN) 10 MG tablet Take 10 mg by mouth daily.     Current Facility-Administered Medications on File Prior to Visit  Medication  . omalizumab Arvid Right) injection 150 mg   Allergies  Allergen Reactions  . Codeine Nausea Only  . Propoxyphene N-Acetaminophen Nausea Only  . Shellfish-Derived Products Rash   Past Medical History:  Diagnosis Date  . Asthma   . COPD (chronic obstructive pulmonary disease) ([place])   . Depression   . Hyperlipemia   . Hypertension   .  Hypothyroidism   . Prediabetes   . SVD (spontaneous vaginal delivery)    x 2  . Vitamin D deficiency    Health Maintenance  Topic Date Due  . TETANUS/TDAP  10/10/2014  . INFLUENZA VACCINE  Completed  . DEXA SCAN  Addressed  . ZOSTAVAX  Completed  . PNA vac Low Risk Adult  Completed   Immunization History  Administered Date(s) Administered  . Influenza Whole 10/05/2009, 11/26/2010, 11/05/2011  . Influenza, High Dose Seasonal PF 11/20/2012, 11/22/2013, 11/21/2014, 11/10/2015  . Pneumococcal Conjugate-13 11/22/2013  . Pneumococcal Polysaccharide-23 09/14/2008  . Td 10/09/2004  . Zoster  02/15/2013   Past Surgical History:  Procedure Laterality Date  . DILATATION & CURRETTAGE/HYSTEROSCOPY WITH RESECTOCOPE N/A 09/10/2013   Procedure: DILATATION & CURETTAGE/HYSTEROSCOPY WITH RESECTOCOPE;  Surgeon: Darlyn Chamber, MD;  Location: [facility];  Service: Gynecology;  Laterality: N/A;  . TONSILLECTOMY    . TUBAL LIGATION Bilateral 1970  . WISDOM TOOTH EXTRACTION     Family History  Problem Relation Age of Onset  . Thyroid disease Mother   . Cancer Brother     Thyroid  . Gout Brother   . Hodgkin's lymphoma Brother    Social History  Substance Use Topics  . Smoking status: Never Smoker  . Smokeless tobacco: Never Used  . Alcohol use Yes     Comment: Wine daily    ROS Constitutional: Denies fever, chills, weight loss/gain, headaches, insomnia,  night sweats, and change in appetite. Does c/o fatigue. Eyes: Denies redness, blurred vision, diplopia, discharge, itchy, watery eyes.  ENT: Denies discharge, congestion, post nasal drip, epistaxis, sore throat, earache, hearing loss, dental pain, Tinnitus, Vertigo, Sinus pain, snoring.  Cardio: Denies chest pain, palpitations, irregular heartbeat, syncope, dyspnea, diaphoresis, orthopnea, PND, claudication, edema Respiratory: denies cough, dyspnea, DOE, pleurisy, hoarseness, laryngitis, wheezing.  Gastrointestinal: Denies dysphagia, heartburn, reflux, water brash, pain, cramps, nausea, vomiting, bloating, diarrhea, constipation, hematemesis, melena, hematochezia, jaundice, hemorrhoids Genitourinary: Denies dysuria, frequency, urgency, nocturia, hesitancy, discharge, hematuria, flank pain Breast: Breast lumps, nipple discharge, bleeding.  Musculoskeletal: Denies arthralgia, myalgia, stiffness, Jt. Swelling, pain, limp, and strain/sprain. Denies falls. Skin: Denies puritis, rash, hives, warts, acne, eczema, changing in skin lesion Neuro: No weakness, tremor, incoordination, spasms, paresthesia, pain Psychiatric: Denies confusion, memory  loss, sensory loss. Denies Depression. Endocrine: Denies change in weight, skin, hair change, nocturia, and paresthesia, diabetic polys, visual blurring, hyper / hypo glycemic episodes.  Heme/Lymph: No excessive bleeding, bruising, enlarged lymph nodes.  Physical Exam  BP 112/76   Pulse 64   Temp 97.3 F (36.3 C)   Resp 16   Ht 4' 11.5" (1.511 m)   Wt 136 lb 6.4 oz (61.9 kg)   BMI 27.09 kg/m   General Appearance: Well nourished and in no apparent distress.  Eyes: PERRLA, EOMs, conjunctiva no swelling or erythema, normal fundi and vessels. Sinuses: No frontal/maxillary tenderness ENT/Mouth: EACs patent / TMs  nl. Nares clear without erythema, swelling, mucoid exudates. Oral hygiene is good. No erythema, swelling, or exudate. Tongue normal, non-obstructing. Tonsils not swollen or erythematous. Hearing normal.  Neck: Supple, thyroid normal. No bruits, nodes or JVD. Respiratory: Respiratory effort normal.  BS equal and clear bilateral without rales, rhonci, wheezing or stridor. Cardio: Heart sounds are normal with regular rate and rhythm and no murmurs, rubs or gallops. Peripheral pulses are normal and equal bilaterally without edema. No aortic or femoral bruits. Chest: symmetric with normal excursions and percussion. Breasts: Symmetric, without lumps, nipple discharge, retractions, or fibrocystic changes.  Abdomen:  Flat, soft with bowel sounds active. Nontender, no guarding, rebound, hernias, masses, or organomegaly.  Lymphatics: Non tender without lymphadenopathy.  Musculoskeletal: Full ROM all peripheral extremities, joint stability, 5/5 strength, and normal gait. Skin: Warm and dry without rashes, lesions, cyanosis, clubbing or  ecchymosis.  Neuro: Cranial nerves intact, reflexes equal bilaterally. Normal muscle tone, no cerebellar symptoms. Sensation intact.  Pysch: Alert and oriented X 3, normal affect, Insight and Judgment appropriate.   Assessment and Plan  1. Essential  hypertension  - Microalbumin / creatinine urine ratio - EKG 12-Lead - Urinalysis, Routine w reflex microscopic - CBC with Differential/Platelet - BASIC METABOLIC PANEL WITH GFR - TSH  2. Mixed hyperlipidemia  - EKG 12-Lead - Hepatic function panel - Lipid panel - TSH  3. Prediabetes  - EKG 12-Lead - Hemoglobin A1c - Insulin, random  4. Vitamin D deficiency  - VITAMIN D 25 Hydroxy  5. Extrinsic asthma   6. Screening for rectal cancer  - POC Hemoccult Bld/Stl  7. Screening for ischemic heart disease  - EKG 12-Lead  8. Medication management  - Microalbumin / creatinine urine ratio - Urinalysis, Routine w reflex microscopic - CBC with Differential/Platelet - BASIC METABOLIC PANEL WITH GFR - Hepatic function panel - Magnesium       Continue prudent diet as discussed, weight control, BP monitoring, regular exercise, and medications. Discussed med's effects and SE's. Screening labs and tests as requested with regular follow-up as recommended. Over 40 minutes of exam, counseling, chart review and high complex critical decision making was performed.

## 2016-02-10 LAB — URINALYSIS, MICROSCOPIC ONLY
BACTERIA UA: NONE SEEN [HPF]
CASTS: NONE SEEN [LPF]
CRYSTALS: NONE SEEN [HPF]
Yeast: NONE SEEN [HPF]

## 2016-02-10 LAB — URINALYSIS, ROUTINE W REFLEX MICROSCOPIC
BILIRUBIN URINE: NEGATIVE
GLUCOSE, UA: NEGATIVE
HGB URINE DIPSTICK: NEGATIVE
Ketones, ur: NEGATIVE
Nitrite: NEGATIVE
PH: 7 (ref 5.0–8.0)
PROTEIN: NEGATIVE
Specific Gravity, Urine: 1.011 (ref 1.001–1.035)

## 2016-02-10 LAB — INSULIN, RANDOM: INSULIN: 5.3 u[IU]/mL (ref 2.0–19.6)

## 2016-02-10 LAB — VITAMIN D 25 HYDROXY (VIT D DEFICIENCY, FRACTURES): VIT D 25 HYDROXY: 65 ng/mL (ref 30–100)

## 2016-02-10 LAB — MICROALBUMIN / CREATININE URINE RATIO
Creatinine, Urine: 85 mg/dL (ref 20–320)
MICROALB/CREAT RATIO: 5 ug/mg{creat} (ref <30)
Microalb, Ur: 0.4 mg/dL

## 2016-02-27 ENCOUNTER — Ambulatory Visit (INDEPENDENT_AMBULATORY_CARE_PROVIDER_SITE_OTHER): Payer: Medicare Other

## 2016-02-27 DIAGNOSIS — J4540 Moderate persistent asthma, uncomplicated: Secondary | ICD-10-CM | POA: Diagnosis not present

## 2016-02-28 MED ORDER — OMALIZUMAB 150 MG ~~LOC~~ SOLR
150.0000 mg | SUBCUTANEOUS | Status: DC
  Administered 2016-02-27: 150 mg via SUBCUTANEOUS

## 2016-03-06 ENCOUNTER — Telehealth: Payer: Self-pay | Admitting: Internal Medicine

## 2016-03-06 NOTE — Telephone Encounter (Signed)
#   vials:1 Ordered date:03/06/16 Shipping Date:03/06/16

## 2016-03-07 NOTE — Telephone Encounter (Signed)
#   Vials:1 Arrival Date:03/07/16 Lot [id number]  Exp Date:8/21

## 2016-04-02 ENCOUNTER — Ambulatory Visit (INDEPENDENT_AMBULATORY_CARE_PROVIDER_SITE_OTHER): Payer: Medicare Other

## 2016-04-02 DIAGNOSIS — J4540 Moderate persistent asthma, uncomplicated: Secondary | ICD-10-CM

## 2016-04-04 MED ORDER — OMALIZUMAB 150 MG ~~LOC~~ SOLR
150.0000 mg | Freq: Once | SUBCUTANEOUS | Status: AC
  Administered 2016-04-02: 150 mg via SUBCUTANEOUS

## 2016-04-16 ENCOUNTER — Telehealth: Payer: Self-pay | Admitting: Internal Medicine

## 2016-04-17 NOTE — Telephone Encounter (Signed)
#   Vials:1 Arrival Date:04/17/16 Lot #:[id number] Exp Date:9/21

## 2016-04-17 NOTE — Telephone Encounter (Addendum)
#   vials:1 Ordered date:04/16/16 Shipping Date:04/16/16  I put order in after 5 and it went downhill from there,ran out of time.

## 2016-04-22 DIAGNOSIS — H8123 Vestibular neuronitis, bilateral: Secondary | ICD-10-CM | POA: Diagnosis not present

## 2016-04-22 DIAGNOSIS — H903 Sensorineural hearing loss, bilateral: Secondary | ICD-10-CM | POA: Diagnosis not present

## 2016-05-02 ENCOUNTER — Ambulatory Visit (INDEPENDENT_AMBULATORY_CARE_PROVIDER_SITE_OTHER): Payer: Medicare Other

## 2016-05-02 DIAGNOSIS — J4540 Moderate persistent asthma, uncomplicated: Secondary | ICD-10-CM

## 2016-05-06 MED ORDER — OMALIZUMAB 150 MG ~~LOC~~ SOLR
150.0000 mg | SUBCUTANEOUS | Status: DC
  Administered 2016-05-02: 150 mg via SUBCUTANEOUS

## 2016-05-06 NOTE — Progress Notes (Signed)
Xolair injection documentation and charges entered by Laylynn Campanella, RMA, based on injection sheet filled out by Tammy Scott per office protocol.   

## 2016-05-08 ENCOUNTER — Telehealth: Payer: Self-pay | Admitting: Internal Medicine

## 2016-05-08 NOTE — Telephone Encounter (Signed)
#   vials:1 Ordered date:05/08/16 Shipping Date:05/08/16

## 2016-05-09 NOTE — Telephone Encounter (Signed)
#   Vials:1 Arrival Date:05/09/16 Lot #:[id number] Exp Date:9/21

## 2016-05-14 ENCOUNTER — Ambulatory Visit (INDEPENDENT_AMBULATORY_CARE_PROVIDER_SITE_OTHER): Payer: Medicare Other | Admitting: Internal Medicine

## 2016-05-14 ENCOUNTER — Encounter: Payer: Self-pay | Admitting: Internal Medicine

## 2016-05-14 VITALS — BP 110/58 | HR 72 | Temp 97.8°F | Resp 16 | Ht 59.5 in | Wt 138.0 lb

## 2016-05-14 DIAGNOSIS — E782 Mixed hyperlipidemia: Secondary | ICD-10-CM | POA: Diagnosis not present

## 2016-05-14 DIAGNOSIS — J301 Allergic rhinitis due to pollen: Secondary | ICD-10-CM | POA: Diagnosis not present

## 2016-05-14 DIAGNOSIS — R7303 Prediabetes: Secondary | ICD-10-CM | POA: Diagnosis not present

## 2016-05-14 DIAGNOSIS — E559 Vitamin D deficiency, unspecified: Secondary | ICD-10-CM

## 2016-05-14 DIAGNOSIS — I10 Essential (primary) hypertension: Secondary | ICD-10-CM

## 2016-05-14 DIAGNOSIS — E039 Hypothyroidism, unspecified: Secondary | ICD-10-CM

## 2016-05-14 DIAGNOSIS — Z79899 Other long term (current) drug therapy: Secondary | ICD-10-CM

## 2016-05-14 MED ORDER — PROMETHAZINE-DM 6.25-15 MG/5ML PO SYRP: ORAL_SOLUTION | ORAL | 1 refills | Status: DC

## 2016-05-14 MED ORDER — DEXAMETHASONE SODIUM PHOSPHATE 10 MG/ML IJ SOLN
10.0000 mg | Freq: Once | INTRAMUSCULAR | Status: AC
  Administered 2016-05-14: 10 mg via INTRAMUSCULAR

## 2016-05-14 NOTE — Progress Notes (Signed)
Assessment and Plan:  Hypertension:  -Continue medication,  -monitor blood pressure at home.  -Continue DASH diet.   -Reminder to go to the ER if any CP, SOB, nausea, dizziness, severe HA, changes vision/speech, left arm numbness and tingling, and jaw pain.  Cholesterol: -Continue diet and exercise.   Pre-diabetes: -Continue diet and exercise.   Vitamin D Def: -continue medications.   Allergic rhinitis -restart loratidine -decadron injection today -phenergan dm -offered flonase which patient declined  Hypothyroidism -cont levothyroxine  Patient refused all blood work today.    Continue diet and meds as discussed. Further disposition pending results of labs.  HPI 83 y.o. female  presents for 3 month follow up with hypertension, hyperlipidemia, prediabetes and vitamin D.   Her blood pressure has been controlled at home, today their BP is BP: (!) 110/58.   She does workout. She denies chest pain, shortness of breath, dizziness.   She is on cholesterol medication and denies myalgias. Her cholesterol is at goal. The cholesterol last visit was:   Lab Results  Component Value Date   CHOL 175 02/09/2016   HDL 80 02/09/2016   LDLCALC 76 02/09/2016   TRIG 97 02/09/2016   CHOLHDL 2.2 02/09/2016     She has been working on diet and exercise for prediabetes, and denies foot ulcerations, hyperglycemia, hypoglycemia , increased appetite, nausea, paresthesia of the feet, polydipsia, polyuria, visual disturbances, vomiting and weight loss. Last A1C in the office was:  Lab Results  Component Value Date   HGBA1C 5.2 02/09/2016    Patient is on Vitamin D supplement.  Lab Results  Component Value Date   VD25OH 35 02/09/2016     She reports that she has had two days of congestion and runny nose.  She reports that she is coughing.  The cough is a deep barky cough.  She reports that she has only been taking tylenol and nyquil.  She is using pulmicort.  She is not doing claritin right  now.  She has spent a lot of time out in her yard recently.    She declines any blood work today.  She reports that it was good last time and she feels she doesn't need it today.    She is still taking her thyroid medication first thing in the morning on an empty stomach. She reports that she is doing well with it.    Current Medications:  Current Outpatient Prescriptions on File Prior to Visit  Medication Sig Dispense Refill  . budesonide (PULMICORT) 0.25 MG/2ML nebulizer solution Take 2 mLs (0.25 mg total) by nebulization daily. Once daily and will take again if needed 60 mL 11  . calcium-vitamin D (CALCIUM 500/D) 500-200 MG-UNIT per tablet Take 1 tablet by mouth. At times    . Cholecalciferol (VITAMIN D PO) Take 2,000 Units by mouth.    . EPINEPHrine (EPIPEN JR) 0.15 MG/0.3ML injection Inject 0.15 mg into the muscle as needed.      . formoterol (PERFOROMIST) 20 MCG/2ML nebulizer solution Take 2 mLs (20 mcg total) by nebulization 2 (two) times daily.    . Ipratropium-Albuterol (COMBIVENT RESPIMAT) 20-100 MCG/ACT AERS respimat USE 1 INHALATION EVERY 6 HOURS AS NEEDED FOR WHEEZING 12 g 0  . levothyroxine (SYNTHROID, LEVOTHROID) 75 MCG tablet TAKE 1 TABLET DAILY (NEED OFFICE VISIT) 90 tablet 4  . loratadine (CLARITIN) 10 MG tablet Take 10 mg by mouth daily.      . sertraline (ZOLOFT) 50 MG tablet TAKE 1 TABLET DAILY 90 tablet 1  .  simvastatin (ZOCOR) 40 MG tablet TAKE 1 TABLET AT BEDTIME 90 tablet 1   Current Facility-Administered Medications on File Prior to Visit  Medication Dose Route Frequency Provider Last Rate Last Dose  . omalizumab Arvid Right) injection 150 mg  150 mg Subcutaneous Q14 Days [name], MD   150 mg at 05/02/16 [id number]    Medical History:  Past Medical History:  Diagnosis Date  . Asthma   . COPD (chronic obstructive pulmonary disease) ([place])   . Depression   . Hyperlipemia   . Hypertension   . Hypothyroidism   . Prediabetes   . SVD (spontaneous vaginal delivery)     x 2  . Vitamin D deficiency     Allergies:  Allergies  Allergen Reactions  . Codeine Nausea Only  . Propoxyphene N-Acetaminophen Nausea Only  . Shellfish-Derived Products Rash     Review of Systems:  Review of Systems  Constitutional: Negative for chills, fever and malaise/fatigue.  HENT: Positive for congestion, ear pain and sore throat. Negative for hearing loss.   Respiratory: Positive for cough. Negative for sputum production, shortness of breath and wheezing.   Cardiovascular: Negative for chest pain, palpitations and leg swelling.  Gastrointestinal: Negative for abdominal pain, blood in stool, constipation, diarrhea, heartburn, melena, nausea and vomiting.  Genitourinary: Negative.   Musculoskeletal: Negative.   Neurological: Negative for sensory change, focal weakness, loss of consciousness and headaches.  Psychiatric/Behavioral: Negative for depression. The patient is not nervous/anxious and does not have insomnia.     Family history- Review and unchanged  Social history- Review and unchanged  Physical Exam: BP (!) 110/58   Pulse 72   Temp 97.8 F (36.6 C) (Temporal)   Resp 16   Ht 4' 11.5" (1.511 m)   Wt 138 lb (62.6 kg)   SpO2 98%   BMI 27.41 kg/m  Wt Readings from Last 3 Encounters:  05/14/16 138 lb (62.6 kg)  02/09/16 136 lb 6.4 oz (61.9 kg)  07/11/15 134 lb (60.8 kg)    General Appearance: Well nourished well developed, in no apparent distress. Eyes: PERRLA, EOMs, conjunctiva no swelling or erythema ENT/Mouth: Ear canals normal without obstruction, swelling, erythma, discharge.  TMs normal bilaterally.  Oropharynx moist, clear, without exudate, or postoropharyngeal swelling. Neck: Supple, thyroid normal,no cervical adenopathy  Respiratory: Respiratory effort normal, Breath sounds clear A&P without rhonchi, wheeze, or rale.  No retractions, no accessory usage. Cardio: RRR with no MRGs. Brisk peripheral pulses without edema.  Abdomen: Soft, + BS,  Non  tender, no guarding, rebound, hernias, masses. Musculoskeletal: Full ROM, 5/5 strength, Normal gait Skin: Warm, dry without rashes, lesions, ecchymosis.  Neuro: Awake and oriented X 3, Cranial nerves intact. Normal muscle tone, no cerebellar symptoms. Psych: Normal affect, Insight and Judgment appropriate.    Starlyn Skeans, PA-C 2:42 PM [facility]

## 2016-05-31 ENCOUNTER — Other Ambulatory Visit: Payer: Self-pay | Admitting: Internal Medicine

## 2016-06-04 ENCOUNTER — Encounter: Payer: Self-pay | Admitting: Internal Medicine

## 2016-06-04 ENCOUNTER — Ambulatory Visit (INDEPENDENT_AMBULATORY_CARE_PROVIDER_SITE_OTHER): Payer: Medicare Other | Admitting: Internal Medicine

## 2016-06-04 ENCOUNTER — Ambulatory Visit (HOSPITAL_COMMUNITY)
Admission: RE | Admit: 2016-06-04 | Discharge: 2016-06-04 | Disposition: A | Discharge status: Home / Self Care | Payer: Medicare Other | Source: Ambulatory Visit | Attending: Internal Medicine | Admitting: Internal Medicine

## 2016-06-04 VITALS — BP 130/76 | HR 64 | Temp 97.3°F | Resp 16 | Ht 59.5 in | Wt 137.4 lb

## 2016-06-04 DIAGNOSIS — M11262 Other chondrocalcinosis, left knee: Secondary | ICD-10-CM | POA: Diagnosis not present

## 2016-06-04 DIAGNOSIS — M1712 Unilateral primary osteoarthritis, left knee: Secondary | ICD-10-CM | POA: Diagnosis not present

## 2016-06-04 DIAGNOSIS — M11261 Other chondrocalcinosis, right knee: Secondary | ICD-10-CM | POA: Diagnosis not present

## 2016-06-04 DIAGNOSIS — S8002XA Contusion of left knee, initial encounter: Secondary | ICD-10-CM

## 2016-06-04 DIAGNOSIS — M7989 Other specified soft tissue disorders: Secondary | ICD-10-CM | POA: Insufficient documentation

## 2016-06-04 MED ORDER — PREDNISONE 20 MG PO TABS: ORAL_TABLET | ORAL | 0 refills | Status: DC

## 2016-06-04 NOTE — Progress Notes (Signed)
   Subjective:    Patient ID: Kathy Whitaker, female    DOB: 07-09-1933, 83 y.o.   MRN: [id number]  HPI  This nice 84 yo MWF tripped and fell 10 days ago contusing her Lt knee. Has been limping and still having discomfort.   Medication Sig  . PULMICORT  neb soln Take 2 mLs  by neb daily  . calcium-vitamin D  Take 1 tablet by mouth. At times  . VITAMIN D Take 2,000 Units by mouth.  . EPIPEN JR 0.15 MG/0.3ML inj Inject 0.15 mg into the muscle as needed.    [name] PERFOROMIST  neb soln Take 2 mLs  by neb 2 x daily.  . COMBIVENT RESPIMAT USE 1 INHAL EVERY 6 HOURS AS NEEDED  . levothyroxine 75 MCG tablet TAKE 1 TABLET DAILY (NEED OFFICE VISIT)  . loratadine  10 MG tablet Take 10 mg by mouth daily.    [name] PROMETHAZINE-DM Take 5-10 ML PO q8hrs for cough and congestion  . sertraline  50 MG tablet TAKE 1 TABLET DAILY  . simvastatin  40 MG tablet TAKE 1 TABLET AT BEDTIME   Facility-Administered Medications Prior to Visit  Medication Frequency  . omalizumab Arvid Right) injection 150 mg Q14 Days   Allergies  Allergen Reactions  . Codeine Nausea Only  . Propoxyphene N-Acetaminophen Nausea Only  . Shellfish-Derived Products Rash   Past Medical History:  Diagnosis Date  . Asthma   . COPD (chronic obstructive pulmonary disease) ([place])   . Depression   . Hyperlipemia   . Hypertension   . Hypothyroidism   . Prediabetes   . SVD (spontaneous vaginal delivery)    x 2  . Vitamin D deficiency    Review of Systems  10 point systems review negative except as above.    Objective:   Physical Exam  BP 130/76   Pulse 64   Temp 97.3 F (36.3 C)   Resp 16   Ht 4' 11.5" (1.511 m)   Wt 137 lb 6.4 oz (62.3 kg)   BMI 27.29 kg/m   HEENT - WNL Neck - supple.  Chest - Clear equal BS. Cor - Nl HS. RRR w/o sig m. MS- FROM w/o deformities. (+) tender lower Left patella w/o ecchymosis of bruising or STS evident.  Muscle power, tone and bulk Nl. Limping Gait. Neuro - No obvious Cr N abnormalities. Sensory, motor  and Cerebellar functions appear Nl w/o focal abnormalities.    Assessment & Plan:   1. Contusion of left knee, initial encounter  - DG Knee 4 Views W/Patella Left; Future  - predniSONE (DELTASONE) 20 MG tablet; 1 tab 3 x day for 2 days, then 1 tab 2 x day for 2 days, then 1 tab 1 x day for 3 days  Dispense: 13 tablet; Refill: 0

## 2016-06-28 ENCOUNTER — Other Ambulatory Visit: Payer: Self-pay | Admitting: Internal Medicine

## 2016-07-03 ENCOUNTER — Ambulatory Visit (INDEPENDENT_AMBULATORY_CARE_PROVIDER_SITE_OTHER): Payer: Medicare Other

## 2016-07-03 DIAGNOSIS — J4540 Moderate persistent asthma, uncomplicated: Secondary | ICD-10-CM | POA: Diagnosis not present

## 2016-07-04 MED ORDER — OMALIZUMAB 150 MG ~~LOC~~ SOLR
150.0000 mg | SUBCUTANEOUS | Status: DC
  Administered 2016-07-03: 150 mg via SUBCUTANEOUS

## 2016-07-04 NOTE — Progress Notes (Signed)
Xolair injection documentation and charges entered by Ashley Caulfield, RMA, based on injection sheet filled out by Tammy Scott per office protocol.   

## 2016-07-08 ENCOUNTER — Telehealth: Payer: Self-pay | Admitting: Internal Medicine

## 2016-07-08 NOTE — Telephone Encounter (Signed)
#   vials:1 Ordered date:07/08/16 Shipping Date:07/08/16

## 2016-07-09 NOTE — Telephone Encounter (Signed)
#   Vials:1 Arrival Date:07/09/16 Lot #[id number] Exp Date:10/21

## 2016-08-05 ENCOUNTER — Ambulatory Visit: Payer: Medicare Other

## 2016-08-13 ENCOUNTER — Ambulatory Visit (INDEPENDENT_AMBULATORY_CARE_PROVIDER_SITE_OTHER): Payer: Medicare Other

## 2016-08-13 DIAGNOSIS — J4520 Mild intermittent asthma, uncomplicated: Secondary | ICD-10-CM

## 2016-08-14 MED ORDER — OMALIZUMAB 150 MG ~~LOC~~ SOLR
150.0000 mg | SUBCUTANEOUS | Status: DC
  Administered 2016-08-13: 150 mg via SUBCUTANEOUS

## 2016-08-14 NOTE — Progress Notes (Signed)
Documentation of medication administration and charges of Xolair have been completed by Lindsay Lemons, CMA based on the Xolair documentation sheet completed by Tammy Scott.  

## 2016-08-20 ENCOUNTER — Encounter (INDEPENDENT_AMBULATORY_CARE_PROVIDER_SITE_OTHER): Payer: Self-pay

## 2016-08-20 ENCOUNTER — Encounter: Payer: Self-pay | Admitting: Internal Medicine

## 2016-08-20 ENCOUNTER — Ambulatory Visit (INDEPENDENT_AMBULATORY_CARE_PROVIDER_SITE_OTHER): Payer: Medicare Other | Admitting: Internal Medicine

## 2016-08-20 VITALS — BP 100/52 | HR 64 | Temp 97.0°F | Resp 16 | Ht 59.5 in | Wt 131.0 lb

## 2016-08-20 DIAGNOSIS — R7303 Prediabetes: Secondary | ICD-10-CM

## 2016-08-20 DIAGNOSIS — Z79899 Other long term (current) drug therapy: Secondary | ICD-10-CM

## 2016-08-20 DIAGNOSIS — E782 Mixed hyperlipidemia: Secondary | ICD-10-CM

## 2016-08-20 DIAGNOSIS — I10 Essential (primary) hypertension: Secondary | ICD-10-CM | POA: Diagnosis not present

## 2016-08-20 DIAGNOSIS — E559 Vitamin D deficiency, unspecified: Secondary | ICD-10-CM | POA: Diagnosis not present

## 2016-08-20 LAB — CBC WITH DIFFERENTIAL/PLATELET
BASOS PCT: 1 %
Basophils Absolute: 74 cells/uL (ref 0–200)
EOS PCT: 2 %
Eosinophils Absolute: 148 cells/uL (ref 15–500)
HCT: 39.3 % (ref 35.0–45.0)
Hemoglobin: 12.8 g/dL (ref 11.7–15.5)
LYMPHS PCT: 24 %
Lymphs Abs: 1776 cells/uL (ref 850–3900)
MCH: 30.3 pg (ref 27.0–33.0)
MCHC: 32.6 g/dL (ref 32.0–36.0)
MCV: 92.9 fL (ref 80.0–100.0)
MPV: 10.9 fL (ref 7.5–12.5)
Monocytes Absolute: 740 cells/uL (ref 200–950)
Monocytes Relative: 10 %
NEUTROS PCT: 63 %
Neutro Abs: 4662 cells/uL (ref 1500–7800)
PLATELETS: 256 10*3/uL (ref 140–400)
RBC: 4.23 MIL/uL (ref 3.80–5.10)
RDW: 13.9 % (ref 11.0–15.0)
WBC: 7.4 10*3/uL (ref 3.8–10.8)

## 2016-08-20 LAB — HEPATIC FUNCTION PANEL
ALT: 10 U/L (ref 6–29)
AST: 16 U/L (ref 10–35)
Albumin: 3.9 g/dL (ref 3.6–5.1)
Alkaline Phosphatase: 68 U/L (ref 33–130)
BILIRUBIN DIRECT: 0.1 mg/dL (ref <=0.2)
BILIRUBIN INDIRECT: 0.3 mg/dL (ref 0.2–1.2)
BILIRUBIN TOTAL: 0.4 mg/dL (ref 0.2–1.2)
TOTAL PROTEIN: 6.5 g/dL (ref 6.1–8.1)

## 2016-08-20 LAB — LIPID PANEL
CHOLESTEROL: 158 mg/dL (ref <200)
HDL: 61 mg/dL (ref >50)
LDL Cholesterol: 74 mg/dL (ref <100)
TRIGLYCERIDES: 116 mg/dL (ref <150)
Total CHOL/HDL Ratio: 2.6 Ratio (ref <5.0)
VLDL: 23 mg/dL (ref <30)

## 2016-08-20 LAB — BASIC METABOLIC PANEL WITH GFR
BUN: 15 mg/dL (ref 7–25)
CALCIUM: 9.5 mg/dL (ref 8.6–10.4)
CO2: 27 mmol/L (ref 20–31)
Chloride: 104 mmol/L (ref 98–110)
Creat: 0.87 mg/dL (ref 0.60–0.88)
GFR, EST AFRICAN AMERICAN: 73 mL/min (ref >=60)
GFR, EST NON AFRICAN AMERICAN: 63 mL/min (ref >=60)
GLUCOSE: 99 mg/dL (ref 65–99)
Potassium: 4.3 mmol/L (ref 3.5–5.3)
SODIUM: 140 mmol/L (ref 135–146)

## 2016-08-20 LAB — TSH: TSH: 0.61 mIU/L

## 2016-08-20 NOTE — Patient Instructions (Signed)

## 2016-08-20 NOTE — Progress Notes (Signed)
This very nice 83 y.o. MWF presents for 6 month follow up with Hypertension, Hyperlipidemia, Pre-Diabetes, Hypothyroidism and Vitamin D Deficiency. Patient  has hx/oAllergic Asthma is followed by [name] and she's been stable for a # of years on Xolair injections (2007).      Patient is treated for HTN (2004) & BP has been controlled at home. Today's BP is 100/52. She had a negative Stress Myoview in 2013 by Dr Johnsie Cancel. Patient has had no complaints of any cardiac type chest pain, palpitations, dyspnea /orthopnea/PND, dizziness, claudication, or dependent edema.     Hyperlipidemia is controlled with diet & meds. Patient denies myalgias or other med SE's. Last Lipids were at goal: Lab Results  Component Value Date   CHOL 175 02/09/2016   HDL 80 02/09/2016   LDLCALC 76 02/09/2016   TRIG 97 02/09/2016   CHOLHDL 2.2 02/09/2016      Also, the patient has history of PreDiabetes ( A1c 6.0% in 2011 and A1c 5.7% in 2012) and has had no symptoms of reactive hypoglycemia, diabetic polys, paresthesias or visual blurring.  Last A1c was at goal: Lab Results  Component Value Date   HGBA1C 5.2 02/09/2016      Patient has been on replacement Thyroid circa 1980's. Further, the patient also has history of Vitamin D Deficiency and supplements vitamin D without any suspected side-effects. Last vitamin D was at goal: Lab Results  Component Value Date   VD25OH 65 02/09/2016   Current Outpatient Prescriptions on File Prior to Visit  Medication Sig  . budesonide (PULMICORT) 0.25 MG/2ML nebulizer solution Take 2 mLs (0.25 mg total) by nebulization daily. Once daily and will take again if needed  . calcium-vitamin D (CALCIUM 500/D) 500-200 MG-UNIT per tablet Take 1 tablet by mouth. At times  . Cholecalciferol (VITAMIN D PO) Take 2,000 Units by mouth.  . EPINEPHrine (EPIPEN JR) 0.15 MG/0.3ML injection Inject 0.15 mg into the muscle as needed.    . formoterol (PERFOROMIST) 20 MCG/2ML nebulizer solution Take 2  mLs (20 mcg total) by nebulization 2 (two) times daily.  . Ipratropium-Albuterol (COMBIVENT RESPIMAT) 20-100 MCG/ACT AERS respimat USE 1 INHALATION EVERY 6 HOURS AS NEEDED FOR WHEEZING  . levothyroxine (SYNTHROID, LEVOTHROID) 75 MCG tablet TAKE 1 TABLET DAILY (NEED OFFICE VISIT)  . loratadine (CLARITIN) 10 MG tablet Take 10 mg by mouth daily.    . sertraline (ZOLOFT) 50 MG tablet TAKE 1 TABLET DAILY  . simvastatin (ZOCOR) 40 MG tablet TAKE 1 TABLET AT BEDTIME   Current Facility-Administered Medications on File Prior to Visit  Medication  . omalizumab Arvid Right) injection 150 mg   Allergies  Allergen Reactions  . Codeine Nausea Only  . Propoxyphene N-Acetaminophen Nausea Only  . Shellfish-Derived Products Rash   PMHx:   Past Medical History:  Diagnosis Date  . Asthma   . COPD (chronic obstructive pulmonary disease) ([place])   . Depression   . Hyperlipemia   . Hypertension   . Hypothyroidism   . Prediabetes   . SVD (spontaneous vaginal delivery)    x 2  . Vitamin D deficiency    Immunization History  Administered Date(s) Administered  . Influenza Whole 10/05/2009, 11/26/2010, 11/05/2011  . Influenza, High Dose Seasonal PF 11/20/2012, 11/22/2013, 11/21/2014, 11/10/2015  . Pneumococcal Conjugate-13 11/22/2013  . Pneumococcal Polysaccharide-23 09/14/2008  . Td 10/09/2004  . Zoster 02/15/2013   Past Surgical History:  Procedure Laterality Date  . DILATATION & CURRETTAGE/HYSTEROSCOPY WITH RESECTOCOPE N/A 09/10/2013   Procedure: DILATATION &  CURETTAGE/HYSTEROSCOPY WITH RESECTOCOPE;  Surgeon: Darlyn Chamber, MD;  Location: [facility];  Service: Gynecology;  Laterality: N/A;  . TONSILLECTOMY    . TUBAL LIGATION Bilateral 1970  . WISDOM TOOTH EXTRACTION     FHx:    Reviewed / unchanged  SHx:    Reviewed / unchanged  Systems Review:  Constitutional: Denies fever, chills, wt changes, headaches, insomnia, fatigue, night sweats, change in appetite. Eyes: Denies redness, blurred vision,  diplopia, discharge, itchy, watery eyes.  ENT: Denies discharge, congestion, post nasal drip, epistaxis, sore throat, earache, hearing loss, dental pain, tinnitus, vertigo, sinus pain, snoring.  CV: Denies chest pain, palpitations, irregular heartbeat, syncope, dyspnea, diaphoresis, orthopnea, PND, claudication or edema. Respiratory: denies cough, dyspnea, DOE, pleurisy, hoarseness, laryngitis, wheezing.  Gastrointestinal: Denies dysphagia, odynophagia, heartburn, reflux, water brash, abdominal pain or cramps, nausea, vomiting, bloating, diarrhea, constipation, hematemesis, melena, hematochezia  or hemorrhoids. Genitourinary: Denies dysuria, frequency, urgency, nocturia, hesitancy, discharge, hematuria or flank pain. Musculoskeletal: Denies arthralgias, myalgias, stiffness, jt. swelling, pain, limping or strain/sprain.  Skin: Denies pruritus, rash, hives, warts, acne, eczema or change in skin lesion(s). Neuro: No weakness, tremor, incoordination, spasms, paresthesia or pain. Psychiatric: Denies confusion, memory loss or sensory loss. Endo: Denies change in weight, skin or hair change.  Heme/Lymph: No excessive bleeding, bruising or enlarged lymph nodes.  Physical Exam  BP (!) 100/52   Pulse 64   Temp (!) 97 F (36.1 C)   Resp 16   Ht 4' 11.5" (1.511 m)   Wt 131 lb (59.4 kg)   BMI 26.02 kg/m   Appears well nourished, well groomed  and in no distress.  Eyes: PERRLA, EOMs, conjunctiva no swelling or erythema. Sinuses: No frontal/maxillary tenderness ENT/Mouth: EAC's clear, TM's nl w/o erythema, bulging. Nares clear w/o erythema, swelling, exudates. Oropharynx clear without erythema or exudates. Oral hygiene is good. Tongue normal, non obstructing. Hearing intact.  Neck: Supple. Thyroid nl. Car 2+/2+ without bruits, nodes or JVD. Chest: Respirations nl with BS clear & equal w/o rales, rhonchi, wheezing or stridor.  Cor: Heart sounds normal w/ regular rate and rhythm without sig. murmurs,  gallops, clicks or rubs. Peripheral pulses normal and equal  without edema.  Abdomen: Soft & bowel sounds normal. Non-tender w/o guarding, rebound, hernias, masses or organomegaly.  Lymphatics: Unremarkable.  Musculoskeletal: Full ROM all peripheral extremities, joint stability, 5/5 strength and normal gait.  Skin: Warm, dry without exposed rashes, lesions or ecchymosis apparent.  Neuro: Cranial nerves intact, reflexes equal bilaterally. Sensory-motor testing grossly intact. Tendon reflexes grossly intact.  Pysch: Alert & oriented x 3.  Insight and judgement nl & appropriate. No ideations.  Assessment and Plan:  1. Essential hypertension  - Continue medication, monitor blood pressure at home.  - Continue DASH diet. Reminder to go to the ER if any CP,  SOB, nausea, dizziness, severe HA, changes vision/speech.  - CBC with Differential/Platelet - BASIC METABOLIC PANEL WITH GFR - Magnesium - TSH  2. Hyperlipidemia, mixed  - Continue diet/meds, exercise,& lifestyle modifications.  - Continue monitor periodic cholesterol/liver & renal functions   - Hepatic function panel - Lipid panel - TSH  3. Prediabetes  - Continue diet, exercise, lifestyle modifications.  - Monitor appropriate labs.  - Hemoglobin A1c - Insulin, random  4. Vitamin D deficiency  - Continue supplementation.  - VITAMIN D 25 Hydroxy   5. Medication management  - CBC with Differential/Platelet - BASIC METABOLIC PANEL WITH GFR - Hepatic function panel - Magnesium - Lipid panel - TSH -  Hemoglobin A1c - Insulin, random - VITAMIN D 25 Hydroxy s)        Discussed  regular exercise, BP monitoring, weight control to achieve/maintain BMI less than 25 and discussed med and SE's. Recommended labs to assess and monitor clinical status with further disposition pending results of labs. Over 30 minutes of exam, counseling, chart review was performed.

## 2016-08-21 LAB — VITAMIN D 25 HYDROXY (VIT D DEFICIENCY, FRACTURES): VIT D 25 HYDROXY: 57 ng/mL (ref 30–100)

## 2016-08-21 LAB — HEMOGLOBIN A1C
Hgb A1c MFr Bld: 5.4 % (ref <5.7)
MEAN PLASMA GLUCOSE: 108 mg/dL

## 2016-08-21 LAB — MAGNESIUM: Magnesium: 2 mg/dL (ref 1.5–2.5)

## 2016-08-22 LAB — INSULIN, RANDOM: Insulin: 15.1 u[IU]/mL (ref 2.0–19.6)

## 2016-08-29 ENCOUNTER — Telehealth: Payer: Self-pay | Admitting: Internal Medicine

## 2016-08-29 NOTE — Telephone Encounter (Signed)
#   vials:1 Ordered date:08/29/16 Shipping Date:08/29/16

## 2016-09-02 NOTE — Telephone Encounter (Signed)
#   Vials:1 Arrival Date:09/02/16 Lot #:[id number] Exp Date:01/2020

## 2016-09-17 ENCOUNTER — Ambulatory Visit (INDEPENDENT_AMBULATORY_CARE_PROVIDER_SITE_OTHER): Payer: Medicare Other

## 2016-09-17 DIAGNOSIS — J4540 Moderate persistent asthma, uncomplicated: Secondary | ICD-10-CM | POA: Diagnosis not present

## 2016-09-18 DIAGNOSIS — H1045 Other chronic allergic conjunctivitis: Secondary | ICD-10-CM | POA: Diagnosis not present

## 2016-09-18 MED ORDER — OMALIZUMAB 150 MG ~~LOC~~ SOLR
150.0000 mg | SUBCUTANEOUS | Status: DC
  Administered 2016-09-17: 150 mg via SUBCUTANEOUS

## 2016-09-18 NOTE — Progress Notes (Signed)
Xolair injection documentation and charges entered by Medora Roorda, RMA, based on injection sheet filled out by Tammy Scott per office protocol.   

## 2016-10-08 DIAGNOSIS — Z1231 Encounter for screening mammogram for malignant neoplasm of breast: Secondary | ICD-10-CM | POA: Diagnosis not present

## 2016-10-08 DIAGNOSIS — Z780 Asymptomatic menopausal state: Secondary | ICD-10-CM | POA: Diagnosis not present

## 2016-10-08 DIAGNOSIS — R351 Nocturia: Secondary | ICD-10-CM | POA: Diagnosis not present

## 2016-10-08 DIAGNOSIS — Z6826 Body mass index (BMI) 26.0-26.9, adult: Secondary | ICD-10-CM | POA: Diagnosis not present

## 2016-10-08 DIAGNOSIS — Z01419 Encounter for gynecological examination (general) (routine) without abnormal findings: Secondary | ICD-10-CM | POA: Diagnosis not present

## 2016-10-08 LAB — HM MAMMOGRAPHY

## 2016-10-09 LAB — HM MAMMOGRAPHY

## 2016-10-14 ENCOUNTER — Telehealth: Payer: Self-pay | Admitting: Internal Medicine

## 2016-10-14 NOTE — Telephone Encounter (Signed)
#   vials:1 Ordered date:10/14/16 Shipping Date:10/14/16

## 2016-10-15 NOTE — Telephone Encounter (Signed)
#   Vials:1 Arrival Date:10/15/16 Lot #:[id number]  Exp ZJIR:[id number]/[id number]

## 2016-10-18 ENCOUNTER — Ambulatory Visit: Payer: Medicare Other

## 2016-10-21 ENCOUNTER — Ambulatory Visit: Payer: Medicare Other

## 2016-10-24 ENCOUNTER — Ambulatory Visit (INDEPENDENT_AMBULATORY_CARE_PROVIDER_SITE_OTHER): Payer: Medicare Other

## 2016-10-24 DIAGNOSIS — J4540 Moderate persistent asthma, uncomplicated: Secondary | ICD-10-CM | POA: Diagnosis not present

## 2016-11-01 MED ORDER — OMALIZUMAB 150 MG ~~LOC~~ SOLR
150.0000 mg | Freq: Once | SUBCUTANEOUS | Status: AC
  Administered 2016-10-24: 150 mg via SUBCUTANEOUS

## 2016-11-04 ENCOUNTER — Telehealth: Payer: Self-pay | Admitting: Internal Medicine

## 2016-11-04 NOTE — Telephone Encounter (Signed)
#   vials:1 Ordered date:11/04/2016 Shipping Date:11/04/2016

## 2016-11-05 NOTE — Telephone Encounter (Signed)
#   Vials:1 Arrival Date:11/05/16 Lot #:[id number] Exp MDEK:[id number]

## 2016-11-19 ENCOUNTER — Ambulatory Visit (INDEPENDENT_AMBULATORY_CARE_PROVIDER_SITE_OTHER): Payer: Medicare Other

## 2016-11-19 DIAGNOSIS — Z23 Encounter for immunization: Secondary | ICD-10-CM | POA: Diagnosis not present

## 2016-11-27 ENCOUNTER — Ambulatory Visit (INDEPENDENT_AMBULATORY_CARE_PROVIDER_SITE_OTHER): Payer: Medicare Other

## 2016-11-27 DIAGNOSIS — J4540 Moderate persistent asthma, uncomplicated: Secondary | ICD-10-CM | POA: Diagnosis not present

## 2016-11-28 ENCOUNTER — Telehealth: Payer: Self-pay | Admitting: Internal Medicine

## 2016-11-28 MED ORDER — OMALIZUMAB 150 MG ~~LOC~~ SOLR
150.0000 mg | SUBCUTANEOUS | Status: DC
  Administered 2016-11-27: 150 mg via SUBCUTANEOUS

## 2016-11-28 NOTE — Telephone Encounter (Signed)
#   vials:1 Ordered date:11/28/16 Shipping Date:11/28/16

## 2016-11-28 NOTE — Progress Notes (Signed)
Xolair injection documentation and charges entered by Timathy Newberry, RMA, based on injection sheet filled out by Tammy Scott per office protocol.   

## 2016-11-29 NOTE — Telephone Encounter (Signed)
#   Vials:1 Arrival Date:11/29/16 Lot #:[id number] Exp Date:04/2018

## 2016-12-03 ENCOUNTER — Ambulatory Visit (INDEPENDENT_AMBULATORY_CARE_PROVIDER_SITE_OTHER): Payer: Medicare Other | Admitting: Neurology

## 2016-12-03 ENCOUNTER — Encounter: Payer: Self-pay | Admitting: Neurology

## 2016-12-03 VITALS — BP 126/66 | HR 56 | Ht 59.5 in | Wt 133.8 lb

## 2016-12-03 DIAGNOSIS — H9190 Unspecified hearing loss, unspecified ear: Secondary | ICD-10-CM | POA: Diagnosis not present

## 2016-12-03 DIAGNOSIS — R42 Dizziness and giddiness: Secondary | ICD-10-CM | POA: Insufficient documentation

## 2016-12-03 DIAGNOSIS — H81399 Other peripheral vertigo, unspecified ear: Secondary | ICD-10-CM

## 2016-12-03 NOTE — Progress Notes (Signed)
PATIENT: Kathy Whitaker DOB: 1933-08-27  Chief Complaint  Patient presents with  . Dizziness    Orthostatic Vitals: Lying: 126/66, 56, Sitting: 115/60, 57, Standing: 104/64, 63, Standing x 3 minutes: 110/68, 65 .  Reports intermittent dizziness that tends to worsen with positional changes.  She will sometimes have nausea and spinning sensations with these events.  [name] GYN - referring MD    Arvella Nigh, MD  . PCP    [name], MD     HISTORICAL  Kathy Whitaker is a 83 year old female, seen in refer by gynecologist Dr.  Radene Knee, Jenny Reichmann for evaluation of dizziness, initial evaluation was December 03 2016, her primary care physician is Dr. Melford Aase, Gwyndolyn Saxon.  I reviewed and summarized the referring note, she has history of hyperlipidemia, hypothyroidism, osteoporosis, COPD.  Around April 2018, she had an episode of vertigo, she woke up from overnight sleep, when she trying to get up, she had sudden onset vertigo, unsteady, she has to crawl into the bed, slept for a few hours, symptoms gradually reserved, she had mild nausea, but denied double vision, has mild baseline bilateral hearing loss, there is no recurrent similar episodes since.   She had another kind of dizziness in summer of 2018, she was sitting at the pouch, ready to go to the bathroom, when she get up quickly, she has set onset lightheadedness, dizziness, she fell, bumped her chest to the chair arm, she denies chest pain, no heart palpitation, lasting for a few minutes, gradually resolved,  She now learned to get up slowly in the morning time, sitting at the edge of bed for a few minutes,   She denies low back pain, no bilateral lower extremity paresthesia, no bowel and bladder incontinence.   Laboratory evaluation in July 2018, normal vitamin D 57, A1c 5.4, TSH 0.61, LDL 74, magnesium 2.0, liver functional test, BMP, CBC, UA,   REVIEW OF SYSTEMS: Full 14 system review of systems performed and notable only for shortness of  breath, blurred vision, cough, wheezing, easy bleeding, allergy, skin sensitivity, dizziness, sleepiness, hearing loss, spinning sensation,  ALLERGIES: Allergies  Allergen Reactions  . Codeine Nausea Only  . Propoxyphene N-Acetaminophen Nausea Only  . Shellfish-Derived Products Rash    HOME MEDICATIONS: Current Outpatient Prescriptions  Medication Sig Dispense Refill  . budesonide (PULMICORT) 0.25 MG/2ML nebulizer solution Take 2 mLs (0.25 mg total) by nebulization daily. Once daily and will take again if needed 60 mL 11  . calcium-vitamin D (CALCIUM 500/D) 500-200 MG-UNIT per tablet Take 1 tablet by mouth. At times    . Cholecalciferol (VITAMIN D PO) Take 2,000 Units by mouth.    . EPINEPHrine (EPIPEN JR) 0.15 MG/0.3ML injection Inject 0.15 mg into the muscle as needed.      . formoterol (PERFOROMIST) 20 MCG/2ML nebulizer solution Take 2 mLs (20 mcg total) by nebulization 2 (two) times daily.    . Ipratropium-Albuterol (COMBIVENT RESPIMAT) 20-100 MCG/ACT AERS respimat USE 1 INHALATION EVERY 6 HOURS AS NEEDED FOR WHEEZING 12 g 0  . levothyroxine (SYNTHROID, LEVOTHROID) 75 MCG tablet TAKE 1 TABLET DAILY (NEED OFFICE VISIT) 90 tablet 4  . loratadine (CLARITIN) 10 MG tablet Take 10 mg by mouth daily.      . sertraline (ZOLOFT) 50 MG tablet TAKE 1 TABLET DAILY 90 tablet 1  . simvastatin (ZOCOR) 40 MG tablet TAKE 1 TABLET AT BEDTIME 90 tablet 2   Current Facility-Administered Medications  Medication Dose Route Frequency Provider Last Rate Last Dose  . omalizumab Arvid Right)  injection 150 mg  150 mg Subcutaneous Q14 Days [name], MD   150 mg at 11/27/16 1138    PAST MEDICAL HISTORY: Past Medical History:  Diagnosis Date  . Asthma   . COPD (chronic obstructive pulmonary disease) ([place])   . Depression   . Dizziness   . Hyperlipemia   . Hypertension   . Hypothyroidism   . Prediabetes   . SVD (spontaneous vaginal delivery)    x 2  . Vitamin D deficiency     PAST SURGICAL  HISTORY: Past Surgical History:  Procedure Laterality Date  . DILATATION & CURRETTAGE/HYSTEROSCOPY WITH RESECTOCOPE N/A 09/10/2013   Procedure: DILATATION & CURETTAGE/HYSTEROSCOPY WITH RESECTOCOPE;  Surgeon: Darlyn Chamber, MD;  Location: [facility];  Service: Gynecology;  Laterality: N/A;  . TONSILLECTOMY    . TUBAL LIGATION Bilateral 1970  . WISDOM TOOTH EXTRACTION      FAMILY HISTORY: Family History  Problem Relation Age of Onset  . Thyroid disease Mother   . Cancer Brother        Thyroid  . Gout Brother   . Hodgkin's lymphoma Brother   . Other Father        unsure of medical history    SOCIAL HISTORY:  Social History   Social History  . Marital status: Married    Spouse name: N/A  . Number of children: 2  . Years of education: HS   Occupational History  . Retired    Social History Main Topics  . Smoking status: Never Smoker  . Smokeless tobacco: Never Used  . Alcohol use Yes     Comment: Wine daily  . Drug use: No  . Sexual activity: Yes    Birth control/ protection: Surgical   Other Topics Concern  . Not on file   Social History Narrative   Lives at home with her husband.   Right-handed.   3 cups caffeine per day.     PHYSICAL EXAM   Vitals:   12/03/16 1018  BP: 126/66  Pulse: (!) 56  Weight: 133 lb 12 oz (60.7 kg)  Height: 4' 11.5" (1.511 m)    Not recorded      Body mass index is 26.56 kg/m.  PHYSICAL EXAMNIATION:  Gen: NAD, conversant, well nourised, obese, well groomed                     Cardiovascular: Regular rate rhythm, no peripheral edema, warm, nontender. Eyes: Conjunctivae clear without exudates or hemorrhage Neck: Supple, no carotid bruits. Pulmonary: Clear to auscultation bilaterally   NEUROLOGICAL EXAM:  MENTAL STATUS: Speech:    Speech is normal; fluent and spontaneous with normal comprehension.  Cognition:     Orientation to time, place and person     Normal recent and remote memory     Normal Attention span and  concentration     Normal Language, naming, repeating,spontaneous speech     Fund of knowledge   CRANIAL NERVES: CN II: Visual fields are full to confrontation. Fundoscopic exam is normal with sharp discs and no vascular changes. Pupils are round equal and briskly reactive to light. CN III, IV, VI: extraocular movement are normal. No ptosis. CN V: Facial sensation is intact to pinprick in all 3 divisions bilaterally. Corneal responses are intact.  CN VII: Face is symmetric with normal eye closure and smile. CN VIII: Hearing is normal to rubbing fingers CN IX, X: Palate elevates symmetrically. Phonation is normal. CN XI: Head turning and shoulder  shrug are intact CN XII: Tongue is midline with normal movements and no atrophy.  MOTOR: There is no pronator drift of out-stretched arms. Muscle bulk and tone are normal. Muscle strength is normal.  REFLEXES: Reflexes are 2+ and symmetric at the biceps, triceps, knees, and ankles. Plantar responses are flexor.  SENSORY: Intact to light touch, pinprick, positional sensation and vibratory sensation are intact in fingers and toes.  COORDINATION: Rapid alternating movements and fine finger movements are intact. There is no dysmetria on finger-to-nose and heel-knee-shin.    GAIT/STANCE: Posture is normal. Gait is steady with normal steps, base, arm swing, and turning. Heel and toe walking are normal. Tandem gait is normal.  Romberg is absent.   DIAGNOSTIC DATA (LABS, IMAGING, TESTING) - I reviewed patient records, labs, notes, testing and imaging myself where available.   ASSESSMENT AND PLAN  Aryianna Earwood is a 83 y.o. female   Vertigo, hearing loss,  Need to rule out brainstem cerebellum pathology,  Proceed with MRI of the brain with without contrast Lightheadedness, dizziness   this could be related to the sudden blood pressure changes,  I have advised her document or the event, there is no orthostatic blood pressure documented on today's  test, she should also increase water intake,   Marcial Pacas, M.D. Ph.D.  [facility] [address] [place], [place] [address] Ph: [phone] Fax: [phone]  CC: Arvella Nigh, MD, [name], MD

## 2016-12-06 NOTE — Progress Notes (Signed)
Patient ID: Kathy Whitaker, female   DOB: Mar 29, 1933, 83 y.o.   MRN: [id number]  MEDICARE ANNUAL WELLNESS VISIT AND FOLLOW UP  Assessment:   Essential hypertension - continue medications, DASH diet, exercise and monitor at home. Call if greater than 130/80.   Hyperlipidemia, mixed -continue medications, check lipids, decrease fatty foods, increase activity.   Medication management  Hypothyroidism, unspecified type Hypothyroidism-check TSH level, continue medications the same, reminded to take on an empty stomach 30-74mins before food.  -     levothyroxine (SYNTHROID, LEVOTHROID) 75 MCG tablet; TAKE 1 TABLET DAILY  Hyperlipidemia, unspecified hyperlipidemia type -continue medications, check lipids, decrease fatty foods, increase activity.   Depression, major, recurrent, in partial remission ([place]) In remission  Actinic keratosis Schedule removal  Seborrheic keratoses, inflamed Schedule removal  Moderate persistent extrinsic asthma without complication Continue follow up Dr. [name]  Gastroesophageal reflux disease, esophagitis presence not specified Controlled  Hearing loss, unspecified hearing loss type, unspecified laterality Continue to monitor  URTICARIA Following Dr. [name]  ALLERGY, FOOD Following Dr. [name]  Vitamin D deficiency Continue supplement  Vertigo/hearing loss Following Dr. Krista Blue and getting MRI on the 10th  Medicare annual wellness visit, subsequent 1 year   Future Appointments  Date Time Provider [place]  12/14/2016  1:30 PM GI-315 MR 2 [id number] GI-[facility]  03/12/2017 10:00 AM [name], MD [facility] None  04/03/2017  3:00 PM Marcial Pacas, MD [facility] None     Plan:   During the course of the visit the patient was educated and counseled about appropriate screening and preventive services including:    Pneumococcal vaccine   Influenza vaccine  Td vaccine  Screening electrocardiogram  Bone densitometry screening  Colorectal  cancer screening  Diabetes screening  Glaucoma screening  Nutrition counseling   Advanced directives: requested   Subjective:   Debbera Whitaker is a 83 y.o. WWF who presents for Medicare Annual Wellness Visit and acute visit.    She has had elevated blood pressure since 2007 and has been monitored expectantly. [name] Her blood pressure has been controlled at home, & today their BP is BP: 116/72 She does not workout. She denies chest pain.   Patient has history of Asthma/allergies, follows with Dr. [name]. She is on pulmicort/performist once a day, she has been using the combivent once a day, and claritin and xolair injections that have helped significantly. Following with neurologist, Dr. Krista Blue, had vertigo and getting MRI on the 10th.  She is on cholesterol medication and denies myalgias. Her cholesterol is at goal. The cholesterol last visit was:    Lab Results  Component Value Date   CHOL 158 08/20/2016   HDL 61 08/20/2016   LDLCALC 74 08/20/2016   TRIG 116 08/20/2016   CHOLHDL 2.6 08/20/2016   She has had prediabetes for 4 years (2011 with A1c 6.0%). She has been working on diet and exercise for prediabetes, and denies foot ulcerations, hyperglycemia, paresthesia of the feet, polydipsia, polyuria and visual disturbances. Last A1C in the office was:  Lab Results  Component Value Date   HGBA1C 5.4 08/20/2016   Patient is on Vitamin D supplement.   Lab Results  Component Value Date   VD25OH 91 08/20/2016     She is on thyroid medication. Her medication was not changed last visit.   Lab Results  Component Value Date   TSH 0.61 08/20/2016  .  BMI is Body mass index is 26.41 kg/m., she is working on diet and exercise. Wt Readings from Last  3 Encounters:  12/09/16 133 lb (60.3 kg)  12/03/16 133 lb 12 oz (60.7 kg)  08/20/16 131 lb (59.4 kg)   Names of Other Physician/Practitioners you currently use: 1. [facility] here for primary care 2. Dr  Thom Chimes (OD), eye doctor, last visit Oct 2018 3. Dr Ignatius Specking, dentist, last visit 2017  Patient Care Team: [name], MD as PCP - General (Internal Medicine) Elsie Stain, MD (Pulmonary Disease) Arvella Nigh, MD as Consulting Physician (Obstetrics and Gynecology)  Medication Review: Current Outpatient Medications on File Prior to Visit  Medication Sig  . budesonide (PULMICORT) 0.25 MG/2ML nebulizer solution Take 2 mLs (0.25 mg total) by nebulization daily. Once daily and will take again if needed  . calcium-vitamin D (CALCIUM 500/D) 500-200 MG-UNIT per tablet Take 1 tablet by mouth. At times  . Cholecalciferol (VITAMIN D PO) Take 2,000 Units by mouth.  . EPINEPHrine (EPIPEN JR) 0.15 MG/0.3ML injection Inject 0.15 mg into the muscle as needed.    . formoterol (PERFOROMIST) 20 MCG/2ML nebulizer solution Take 2 mLs (20 mcg total) by nebulization 2 (two) times daily.  . Ipratropium-Albuterol (COMBIVENT RESPIMAT) 20-100 MCG/ACT AERS respimat USE 1 INHALATION EVERY 6 HOURS AS NEEDED FOR WHEEZING  . loratadine (CLARITIN) 10 MG tablet Take 10 mg by mouth daily.    . sertraline (ZOLOFT) 50 MG tablet TAKE 1 TABLET DAILY  . simvastatin (ZOCOR) 40 MG tablet TAKE 1 TABLET AT BEDTIME   Current Facility-Administered Medications on File Prior to Visit  Medication  . omalizumab Arvid Right) injection 150 mg    Current Problems (verified) Patient Active Problem List   Diagnosis Date Noted  . Depression, major, recurrent, in partial remission ([place]) 12/09/2016  . Vertigo 12/03/2016  . Dizziness 12/03/2016  . Hearing loss 12/03/2016  . Medicare annual wellness visit, subsequent 11/29/2014  . Essential hypertension 05/21/2013  . Medication management 05/21/2013  . Prediabetes   . Vitamin D deficiency   . Hypothyroidism   . Hyperlipemia   . ALLERGY, FOOD 04/05/2008  . URTICARIA 03/04/2008  . Extrinsic asthma 05/07/2007  . GERD 11/12/2006   Screening Tests Immunization History   Administered Date(s) Administered  . Influenza Whole 10/05/2009, 11/26/2010, 11/05/2011  . Influenza, High Dose Seasonal PF 11/20/2012, 11/22/2013, 11/21/2014, 11/10/2015, 11/19/2016  . Pneumococcal Conjugate-13 11/22/2013  . Pneumococcal Polysaccharide-23 09/14/2008  . Td 10/09/2004  . Zoster 02/15/2013   Preventative care: Last colonoscopy: 10/2005, will not have another and declines cologuard  MGM 06/2016 following with Dr. Radene Knee, will request record DEXA 06/2014 PAP 2015 CT chest 2005 CXR 2014 Stress test 2013  Prior vaccinations: TD : 10/09/2004 DUE Influenza: HD 2016  Pneumococcal: 09/14/2008 due for renewal Prevnar: 11/22/2013 Shingles/Zostavax: 12/2012  Allergies Allergies  Allergen Reactions  . Codeine Nausea Only  . Propoxyphene N-Acetaminophen Nausea Only  . Shellfish-Derived Products Rash    SURGICAL HISTORY She  has a past surgical history that includes Tubal ligation (Bilateral, 1970); Tonsillectomy; Wisdom tooth extraction; and DILATATION & CURETTAGE/HYSTEROSCOPY WITH RESECTOCOPE (N/A, 09/10/2013). FAMILY HISTORY Her family history includes Cancer in her brother; Gout in her brother; Hodgkin's lymphoma in her brother; Other in her father; Thyroid disease in her mother. SOCIAL HISTORY She  reports that  has never smoked. she has never used smokeless tobacco. She reports that she drinks alcohol. She reports that she does not use drugs.  MEDICARE WELLNESS OBJECTIVES: Physical activity:   Cardiac risk factors:   Depression/mood screen:   Depression screen [facility] 2/9 08/20/2016  Decreased Interest  0  Down, Depressed, Hopeless 0  PHQ - 2 Score 0    ADLs:  In your present state of health, do you have any difficulty performing the following activities: 08/20/2016 02/09/2016  Hearing? N N  Vision? N N  Difficulty concentrating or making decisions? N N  Walking or climbing stairs? N N  Dressing or bathing? N N  Doing errands, shopping? N N  Some recent data might be  hidden     Cognitive Testing  Alert? Yes  Normal Appearance?Yes  Oriented to person? Yes  Place? Yes   Time? Yes  Recall of three objects?  Yes  Can perform simple calculations? Yes  Displays appropriate judgment?Yes  Can read the correct time from a watch face?Yes  EOL planning: Does Patient Have a Medical Advance Directive?: Yes Type of Advance Directive: Healthcare Power of Attorney, Living will [place] in Chart?: Yes      Objective:     Blood pressure 116/72, pulse 69, temperature (!) 97.3 F (36.3 C), resp. rate 14, height 4' 11.5" (1.511 m), weight 133 lb (60.3 kg), SpO2 98 %. Body mass index is 26.41 kg/m.  General appearance: alert, no distress, WD/WN, female HEENT: normocephalic, sclerae anicteric, TMs pearly, nares patent, no discharge or erythema, pharynx normal Oral cavity: MMM, no lesions Neck: supple, no lymphadenopathy, no thyromegaly, no masses Heart: RRR, normal S1, S2, no murmurs Lungs: CTA bilaterally, course breath sounds, no wheezes, rhonchi, or rales Abdomen: +bs, soft, non tender, non distended, no masses, no hepatomegaly, no splenomegaly Musculoskeletal: nontender, no swelling, no obvious deformity Extremities: no edema, no cyanosis, no clubbing Pulses: 2+ symmetric, upper and lower extremities, normal cap refill Neurological: alert, oriented x 3, CN2-12 intact, strength normal upper extremities and lower extremities, sensation normal throughout, DTRs 2+ throughout, no cerebellar signs, gait normal Skin left brown macule on left arm and seb derm/actinic keratosis, wants removal Psychiatric: normal affect, behavior normal, pleasant   Medicare Attestation I have personally reviewed: The patient's medical and social history Their use of alcohol, tobacco or illicit drugs Their current medications and supplements The patient's functional ability including ADLs,fall risks, home safety risks, cognitive, and hearing and visual  impairment Diet and physical activities Evidence for depression or mood disorders  The patient's weight, height, BMI, and visual acuity have been recorded in the chart.  I have made referrals, counseling, and provided education to the patient based on review of the above and I have provided the patient with a written personalized care plan for preventive services.    Vicie Mutters, PA-C   12/09/2016

## 2016-12-09 ENCOUNTER — Encounter: Payer: Self-pay | Admitting: Physician Assistant

## 2016-12-09 ENCOUNTER — Ambulatory Visit (INDEPENDENT_AMBULATORY_CARE_PROVIDER_SITE_OTHER): Payer: Medicare Other | Admitting: Physician Assistant

## 2016-12-09 VITALS — BP 116/72 | HR 69 | Temp 97.3°F | Resp 14 | Ht 59.5 in | Wt 133.0 lb

## 2016-12-09 DIAGNOSIS — L272 Dermatitis due to ingested food: Secondary | ICD-10-CM

## 2016-12-09 DIAGNOSIS — F3341 Major depressive disorder, recurrent, in partial remission: Secondary | ICD-10-CM | POA: Insufficient documentation

## 2016-12-09 DIAGNOSIS — I10 Essential (primary) hypertension: Secondary | ICD-10-CM | POA: Diagnosis not present

## 2016-12-09 DIAGNOSIS — Z0001 Encounter for general adult medical examination with abnormal findings: Secondary | ICD-10-CM | POA: Diagnosis not present

## 2016-12-09 DIAGNOSIS — Z79899 Other long term (current) drug therapy: Secondary | ICD-10-CM | POA: Diagnosis not present

## 2016-12-09 DIAGNOSIS — E785 Hyperlipidemia, unspecified: Secondary | ICD-10-CM

## 2016-12-09 DIAGNOSIS — L820 Inflamed seborrheic keratosis: Secondary | ICD-10-CM

## 2016-12-09 DIAGNOSIS — K219 Gastro-esophageal reflux disease without esophagitis: Secondary | ICD-10-CM

## 2016-12-09 DIAGNOSIS — H9190 Unspecified hearing loss, unspecified ear: Secondary | ICD-10-CM | POA: Diagnosis not present

## 2016-12-09 DIAGNOSIS — R6889 Other general symptoms and signs: Secondary | ICD-10-CM | POA: Diagnosis not present

## 2016-12-09 DIAGNOSIS — E559 Vitamin D deficiency, unspecified: Secondary | ICD-10-CM

## 2016-12-09 DIAGNOSIS — Z0000 Encounter for general adult medical examination without abnormal findings: Secondary | ICD-10-CM

## 2016-12-09 DIAGNOSIS — E039 Hypothyroidism, unspecified: Secondary | ICD-10-CM

## 2016-12-09 DIAGNOSIS — L570 Actinic keratosis: Secondary | ICD-10-CM

## 2016-12-09 DIAGNOSIS — L509 Urticaria, unspecified: Secondary | ICD-10-CM | POA: Diagnosis not present

## 2016-12-09 DIAGNOSIS — R42 Dizziness and giddiness: Secondary | ICD-10-CM

## 2016-12-09 DIAGNOSIS — J4540 Moderate persistent asthma, uncomplicated: Secondary | ICD-10-CM

## 2016-12-09 DIAGNOSIS — E782 Mixed hyperlipidemia: Secondary | ICD-10-CM

## 2016-12-09 MED ORDER — LEVOTHYROXINE SODIUM 75 MCG PO TABS: ORAL_TABLET | ORAL | 0 refills | Status: DC

## 2016-12-09 NOTE — Patient Instructions (Signed)
Seborrheic Keratosis Seborrheic keratosis is a common, noncancerous (benign) skin growth. This condition causes waxy, rough, tan, brown, or black spots to appear on the skin. These skin growths can be flat or raised. What are the causes? The cause of this condition is not known. What increases the risk? This condition is more likely to develop in:  People who have a family history of seborrheic keratosis.  People who are 50 or older.  People who are pregnant.  People who have had estrogen replacement therapy.  What are the signs or symptoms? This condition often occurs on the face, chest, shoulders, back, or other areas. These growths:  Are usually painless, but may become irritated and itchy.  Can be yellow, brown, black, or other colors.  Are slightly raised or have a flat surface.  Are sometimes rough or wart-like in texture.  Are often waxy on the surface.  Are round or oval-shaped.  Sometimes look like they are "stuck on."  Often occur in groups, but may occur as a single growth.  How is this diagnosed? This condition is diagnosed with a medical history and physical exam. A sample of the growth may be tested (skin biopsy). You may need to see a skin specialist (dermatologist). How is this treated? Treatment is not usually needed for this condition, unless the growths are irritated or are often bleeding. You may also choose to have the growths removed if you do not like their appearance. Most commonly, these growths are treated with a procedure in which liquid nitrogen is applied to "freeze" off the growth (cryosurgery). They may also be burned off with electricity or cut off. Follow these instructions at home:  Watch your growth for any changes.  Keep all follow-up visits as told by your health care provider. This is important.  Do not scratch or pick at the growth or growths. This can cause them to become irritated or infected. Contact a health care provider  if:  You suddenly have many new growths.  Your growth bleeds, itches, or hurts.  Your growth suddenly becomes larger or changes color. This information is not intended to replace advice given to you by your health care provider. Make sure you discuss any questions you have with your health care provider. Document Released: 02/23/2010 Document Revised: 06/29/2015 Document Reviewed: 06/08/2014 Elsevier Interactive Patient Education  2017 Elsevier Inc.  

## 2016-12-14 ENCOUNTER — Ambulatory Visit
Admission: RE | Admit: 2016-12-14 | Discharge: 2016-12-14 | Disposition: A | Discharge status: Home / Self Care | Payer: Medicare Other | Source: Ambulatory Visit | Attending: Neurology | Admitting: Neurology

## 2016-12-14 DIAGNOSIS — H81399 Other peripheral vertigo, unspecified ear: Secondary | ICD-10-CM

## 2016-12-14 MED ORDER — GADOBENATE DIMEGLUMINE 529 MG/ML IV SOLN
10.0000 mL | Freq: Once | INTRAVENOUS | Status: AC | PRN
  Administered 2016-12-14: 10 mL via INTRAVENOUS

## 2016-12-24 ENCOUNTER — Encounter: Payer: Self-pay | Admitting: *Deleted

## 2016-12-30 ENCOUNTER — Encounter: Payer: Self-pay | Admitting: Internal Medicine

## 2017-01-17 ENCOUNTER — Ambulatory Visit (INDEPENDENT_AMBULATORY_CARE_PROVIDER_SITE_OTHER): Payer: Medicare Other

## 2017-01-17 DIAGNOSIS — J4540 Moderate persistent asthma, uncomplicated: Secondary | ICD-10-CM

## 2017-01-20 MED ORDER — OMALIZUMAB 150 MG ~~LOC~~ SOLR
150.0000 mg | SUBCUTANEOUS | Status: DC
  Administered 2017-01-17: 150 mg via SUBCUTANEOUS

## 2017-01-20 NOTE — Progress Notes (Signed)
Documentation of medication administration and charges of Xolair have been completed by Shalaunda Weatherholtz, CMA based on the Xolair documentation sheet completed by Tammy Scott.  

## 2017-02-13 ENCOUNTER — Telehealth: Payer: Self-pay | Admitting: *Deleted

## 2017-02-13 NOTE — Telephone Encounter (Signed)
#   vials:1 Ordered date: 02/13/17 Shipping Date:02/13/17

## 2017-02-14 NOTE — Telephone Encounter (Signed)
#   Vials:1 Arrival Date:02/14/17 Lot #:[id number] Exp Date:01/2018

## 2017-02-20 ENCOUNTER — Ambulatory Visit: Payer: Medicare Other

## 2017-02-20 ENCOUNTER — Ambulatory Visit (INDEPENDENT_AMBULATORY_CARE_PROVIDER_SITE_OTHER): Payer: Medicare Other

## 2017-02-20 DIAGNOSIS — J4540 Moderate persistent asthma, uncomplicated: Secondary | ICD-10-CM

## 2017-02-26 MED ORDER — OMALIZUMAB 150 MG ~~LOC~~ SOLR
150.0000 mg | SUBCUTANEOUS | Status: DC
  Administered 2017-02-20: 150 mg via SUBCUTANEOUS

## 2017-02-26 NOTE — Progress Notes (Signed)
Documentation of medication administration and charges of Xolair have been completed by Jary Louvier, CMA based on the Xolair documentation sheet completed by Tammy Scott.  

## 2017-03-12 ENCOUNTER — Ambulatory Visit (INDEPENDENT_AMBULATORY_CARE_PROVIDER_SITE_OTHER): Payer: Medicare Other | Admitting: Internal Medicine

## 2017-03-12 VITALS — BP 112/74 | HR 64 | Temp 97.4°F | Resp 16 | Ht 60.0 in | Wt 130.6 lb

## 2017-03-12 DIAGNOSIS — Z79899 Other long term (current) drug therapy: Secondary | ICD-10-CM | POA: Diagnosis not present

## 2017-03-12 DIAGNOSIS — Z23 Encounter for immunization: Secondary | ICD-10-CM | POA: Diagnosis not present

## 2017-03-12 DIAGNOSIS — Z136 Encounter for screening for cardiovascular disorders: Secondary | ICD-10-CM | POA: Diagnosis not present

## 2017-03-12 DIAGNOSIS — R7309 Other abnormal glucose: Secondary | ICD-10-CM

## 2017-03-12 DIAGNOSIS — E559 Vitamin D deficiency, unspecified: Secondary | ICD-10-CM | POA: Diagnosis not present

## 2017-03-12 DIAGNOSIS — K219 Gastro-esophageal reflux disease without esophagitis: Secondary | ICD-10-CM | POA: Diagnosis not present

## 2017-03-12 DIAGNOSIS — F329 Major depressive disorder, single episode, unspecified: Secondary | ICD-10-CM

## 2017-03-12 DIAGNOSIS — I10 Essential (primary) hypertension: Secondary | ICD-10-CM

## 2017-03-12 DIAGNOSIS — Z1211 Encounter for screening for malignant neoplasm of colon: Secondary | ICD-10-CM

## 2017-03-12 DIAGNOSIS — F32A Depression, unspecified: Secondary | ICD-10-CM

## 2017-03-12 DIAGNOSIS — Z1212 Encounter for screening for malignant neoplasm of rectum: Secondary | ICD-10-CM

## 2017-03-12 DIAGNOSIS — R7303 Prediabetes: Secondary | ICD-10-CM

## 2017-03-12 DIAGNOSIS — E039 Hypothyroidism, unspecified: Secondary | ICD-10-CM | POA: Diagnosis not present

## 2017-03-12 DIAGNOSIS — E782 Mixed hyperlipidemia: Secondary | ICD-10-CM | POA: Diagnosis not present

## 2017-03-12 DIAGNOSIS — S61512A Laceration without foreign body of left wrist, initial encounter: Secondary | ICD-10-CM

## 2017-03-12 NOTE — Progress Notes (Signed)
[facility] [name], M.D.     Uvaldo Bristle. Silverio Lay, P.A.-C Liane Comber, [place], [place] [address] Telephone [phone] Telefax [phone] Annual Screening/Preventative Visit & Comprehensive Evaluation &  Examination     This very nice 84 y.o. MWF presents for a Screening/Preventative Visit & comprehensive evaluation and management of multiple medical co-morbidities.  Patient has been followed for HTN, Prediabetes, Hyperlipidemia and Vitamin D Deficiency. Patient is followed by [name] for allergic asthmaand has been stable since 2007 on Xolair injections. Patient had recent cat scratch of her Rt wrist & hand and is overdue for DT.      HTN predates since 2004. Patient's BP has been controlled at home and patient denies any cardiac symptoms as chest pain, palpitations, shortness of breath, dizziness or ankle swelling. In 2013, she had a negative stress Myoview. Today's BP is at goal - 112/74.      Patient's hyperlipidemia is controlled with diet and medications. Patient denies myalgias or other medication SE's. Last lipids were at goal: Lab Results  Component Value Date   CHOL 158 08/20/2016   HDL 61 08/20/2016   LDLCALC 74 08/20/2016   TRIG 116 08/20/2016   CHOLHDL 2.6 08/20/2016      Patient has prediabetes (A1c 6.0%/2011, then 5.7%/2012) and patient denies reactive hypoglycemic symptoms, visual blurring, diabetic polys, or paresthesias. Last A1c was Normal & at goal: Lab Results  Component Value Date   HGBA1C 5.4 08/20/2016      Patient has been on Thyroid replacement since the 1980's. Finally, patient has history of Vitamin D Deficiency and last Vitamin D was at goal: Lab Results  Component Value Date   VD25OH 57 08/20/2016   Current Outpatient Medications on File Prior to Visit  Medication Sig  . PULMICORT 0.25 MG neb soln Take 2 mLs (0.25 mg total) by nebulization  daily  . calcium-vitamin D  500-200  Take 1 tab  . VITAMIN D  Take 2,000 Units.  [name] EPINEPHrine0.15 MG injection Inject 0.15 mg into the muscle as needed.    . formoterol nebulizer solution Take 2 mL/20 mcgby neb 2  times daily.  . COMBIVENT RESPIMAT) 20-100   respimat 1 INH EVERY 6 HRS AS NEEDED   . Levothyroxine 75 MCG tablet TAKE 1 TABLET DAILY  . loratadine  10 MG tablet Take  daily.    . sertraline  50 MG tablet TAKE 1 TABLET DAILY  . simvastatin 40 MG tablet TAKE 1 TABLET AT BEDTIME   Medication  . omalizumab Arvid Right) injection 150 mg   Allergies  Allergen Reactions  . Codeine Nausea Only  . Propoxyphene N-Acetaminophen Nausea Only  . Shellfish-Derived Products Rash   Past Medical History:  Diagnosis Date  . Asthma   . COPD (chronic obstructive pulmonary disease) ([place])   . Depression   . Dizziness   . Hyperlipemia   . Hypertension   . Hypothyroidism   . Prediabetes   . SVD (spontaneous vaginal delivery)    x 2  . Vitamin D deficiency    Health Maintenance  Topic Date Due  . TETANUS/TDAP  10/10/2014  . INFLUENZA VACCINE  Completed  . DEXA SCAN  Completed  . PNA vac Low Risk Adult  Completed   Immunization History  Administered Date(s) Administered  . Influenza Whole 10/05/2009, 11/26/2010, 11/05/2011  . Influenza, High Dose Seasonal PF 11/20/2012, 11/22/2013, 11/21/2014, 11/10/2015, 11/19/2016  . Pneumococcal Conjugate-13 11/22/2013  .  Pneumococcal Polysaccharide-23 09/14/2008  . Td 10/09/2004  . Zoster 02/15/2013   Last Colon -  Last Pap -  Past Surgical History:  Procedure Laterality Date  . DILATATION & CURRETTAGE/HYSTEROSCOPY WITH RESECTOCOPE N/A 09/10/2013   Procedure: DILATATION & CURETTAGE/HYSTEROSCOPY WITH RESECTOCOPE;  Surgeon: Darlyn Chamber, MD;  Location: [facility];  Service: Gynecology;  Laterality: N/A;  . TONSILLECTOMY    . TUBAL LIGATION Bilateral 1970  . WISDOM TOOTH EXTRACTION     Family History  Problem Relation Age of Onset  . Thyroid  disease Mother   . Cancer Brother        Thyroid  . Gout Brother   . Hodgkin's lymphoma Brother   . Other Father        unsure of medical history   Social History   Tobacco Use  . Smoking status: Never Smoker  . Smokeless tobacco: Never Used  Substance Use Topics  . Alcohol use: Yes    Comment: Wine daily  . Drug use: No    ROS Constitutional: Denies fever, chills, weight loss/gain, headaches, insomnia,  night sweats, and change in appetite. Does c/o fatigue. Eyes: Denies redness, blurred vision, diplopia, discharge, itchy, watery eyes.  ENT: Denies discharge, congestion, post nasal drip, epistaxis, sore throat, earache, hearing loss, dental pain, Tinnitus, Vertigo, Sinus pain, snoring.  Cardio: Denies chest pain, palpitations, irregular heartbeat, syncope, dyspnea, diaphoresis, orthopnea, PND, claudication, edema Respiratory: denies cough, dyspnea, DOE, pleurisy, hoarseness, laryngitis, wheezing.  Gastrointestinal: Denies dysphagia, heartburn, reflux, water brash, pain, cramps, nausea, vomiting, bloating, diarrhea, constipation, hematemesis, melena, hematochezia, jaundice, hemorrhoids Genitourinary: Denies dysuria, frequency, urgency, nocturia, hesitancy, discharge, hematuria, flank pain Breast: Breast lumps, nipple discharge, bleeding.  Musculoskeletal: Denies arthralgia, myalgia, stiffness, Jt. Swelling, pain, limp, and strain/sprain. Denies falls. Skin: Denies puritis, rash, hives, warts, acne, eczema, changing in skin lesion Neuro: No weakness, tremor, incoordination, spasms, paresthesia, pain Psychiatric: Denies confusion, memory loss, sensory loss. Denies Depression. Endocrine: Denies change in weight, skin, hair change, nocturia, and paresthesia, diabetic polys, visual blurring, hyper / hypo glycemic episodes.  Heme/Lymph: No excessive bleeding, bruising, enlarged lymph nodes.  Physical Exam  BP 112/74   Pulse 64   Temp (!) 97.4 F (36.3 C)   Resp 16   Ht 5' (1.524 m)    Wt 130 lb 9.6 oz (59.2 kg)   BMI 25.51 kg/m   General Appearance: Well nourished, well groomed and in no apparent distress.  Eyes: PERRLA, EOMs, conjunctiva no swelling or erythema, normal fundi and vessels. Sinuses: No frontal/maxillary tenderness ENT/Mouth: EACs patent / TMs  nl. Nares clear without erythema, swelling, mucoid exudates. Oral hygiene is good. No erythema, swelling, or exudate. Tongue normal, non-obstructing. Tonsils not swollen or erythematous. Hearing normal.  Neck: Supple, thyroid normal. No bruits, nodes or JVD. Respiratory: Respiratory effort normal.  BS equal and clear bilateral without rales, rhonci, wheezing or stridor. Cardio: Heart sounds are normal with regular rate and rhythm and no murmurs, rubs or gallops. Peripheral pulses are normal and equal bilaterally without edema. No aortic or femoral bruits. Chest: symmetric with normal excursions and percussion. Breasts: Symmetric, without lumps, nipple discharge, retractions, or fibrocystic changes.  Abdomen: Flat, soft with bowel sounds active. Nontender, no guarding, rebound, hernias, masses, or organomegaly.  Lymphatics: Non tender without lymphadenopathy.  Genitourinary:  Musculoskeletal: Full ROM all peripheral extremities, joint stability, 5/5 strength, and normal gait. Skin: Warm and dry without rashes, lesions, cyanosis, clubbing or  ecchymosis. Inflammed liner excoriation of the dorsal wrist and hand that appears  sl inflamed. No cellulitis or lymphangitic streaking.  Neuro: Cranial nerves intact, reflexes equal bilaterally. Normal muscle tone, no cerebellar symptoms. Sensation intact.  Pysch: Alert and oriented X 3, normal affect, Insight and Judgment appropriate.   Assessment and Plan  1. Annual Preventative Screening Examination  1. Essential hypertension  - EKG 12-Lead - Urinalysis, Routine w reflex microscopic - Microalbumin / creatinine urine ratio - CBC with Differential/Platelet - BASIC  METABOLIC PANEL WITH GFR - Magnesium - TSH  2. Hyperlipidemia, mixed  - EKG 12-Lead - Hepatic function panel - Lipid panel - TSH  3. Prediabetes  - EKG 12-Lead - Hemoglobin A1c - Insulin, random  4. Vitamin D deficiency  - VITAMIN D 25 Hydroxy   5. Abnormal glucose  - Hemoglobin A1c - Insulin, random  6. Hypothyroidism, unspecified type  - TSH  7. Gastroesophageal reflux disease, esophagitis presence not specified  - CBC with Differential/Platelet  8. Screening for colorectal cancer  - POC Hemoccult Bld/Stl   9. Screening for ischemic heart disease  - EKG 12-Lead  10. Depression, controlled   11. Laceration of left wrist  - Td : Tetanus/diphtheria   12. Need for prophylactic vaccination with tetanus-diphtheria (Td)   13. Medication management  - Urinalysis, Routine w reflex microscopic - Microalbumin / creatinine urine ratio - CBC with Differential/Platelet - BASIC METABOLIC PANEL WITH GFR - Hepatic function panel - Magnesium - Lipid panel - TSH - Hemoglobin A1c - Insulin, random - VITAMIN D 25 Hydroxy         Patient was counseled in prudent diet to achieve/maintain BMI less than 25 for weight control, BP monitoring, regular exercise and medications. Discussed med's effects and SE's. Screening labs and tests as requested with regular follow-up as recommended. Over 40 minutes of exam, counseling, chart review and high complex critical decision making was performed.

## 2017-03-12 NOTE — Patient Instructions (Signed)

## 2017-03-13 LAB — URINALYSIS, ROUTINE W REFLEX MICROSCOPIC
Bilirubin Urine: NEGATIVE
GLUCOSE, UA: NEGATIVE
Hgb urine dipstick: NEGATIVE
Ketones, ur: NEGATIVE
LEUKOCYTES UA: NEGATIVE
Nitrite: NEGATIVE
PROTEIN: NEGATIVE
Specific Gravity, Urine: 1.012 (ref 1.001–1.03)
pH: 6.5 (ref 5.0–8.0)

## 2017-03-13 LAB — HEPATIC FUNCTION PANEL
AG RATIO: 1.4 (calc) (ref 1.0–2.5)
ALKALINE PHOSPHATASE (APISO): 84 U/L (ref 33–130)
ALT: 11 U/L (ref 6–29)
AST: 18 U/L (ref 10–35)
Albumin: 4.2 g/dL (ref 3.6–5.1)
BILIRUBIN INDIRECT: 0.5 mg/dL (ref 0.2–1.2)
BILIRUBIN TOTAL: 0.6 mg/dL (ref 0.2–1.2)
Bilirubin, Direct: 0.1 mg/dL (ref 0.0–0.2)
Globulin: 3.1 g/dL (calc) (ref 1.9–3.7)
TOTAL PROTEIN: 7.3 g/dL (ref 6.1–8.1)

## 2017-03-13 LAB — BASIC METABOLIC PANEL WITH GFR
BUN: 20 mg/dL (ref 7–25)
CO2: 30 mmol/L (ref 20–32)
Calcium: 9.9 mg/dL (ref 8.6–10.4)
Chloride: 102 mmol/L (ref 98–110)
Creat: 0.84 mg/dL (ref 0.60–0.88)
GFR, Est African American: 76 mL/min/{1.73_m2} (ref >=60)
GFR, Est Non African American: 65 mL/min/{1.73_m2} (ref >=60)
GLUCOSE: 79 mg/dL (ref 65–99)
POTASSIUM: 4.7 mmol/L (ref 3.5–5.3)
Sodium: 140 mmol/L (ref 135–146)

## 2017-03-13 LAB — HEMOGLOBIN A1C
HEMOGLOBIN A1C: 5.5 %{Hb} (ref <5.7)
MEAN PLASMA GLUCOSE: 111 (calc)
eAG (mmol/L): 6.2 (calc)

## 2017-03-13 LAB — LIPID PANEL
CHOL/HDL RATIO: 2.8 (calc) (ref <5.0)
CHOLESTEROL: 193 mg/dL (ref <200)
HDL: 70 mg/dL (ref >50)
LDL CHOLESTEROL (CALC): 106 mg/dL — AB
NON-HDL CHOLESTEROL (CALC): 123 mg/dL (ref <130)
TRIGLYCERIDES: 79 mg/dL (ref <150)

## 2017-03-13 LAB — MICROALBUMIN / CREATININE URINE RATIO
Creatinine, Urine: 47 mg/dL (ref 20–275)
Microalb Creat Ratio: 13 mcg/mg creat (ref <30)
Microalb, Ur: 0.6 mg/dL

## 2017-03-13 LAB — CBC WITH DIFFERENTIAL/PLATELET
BASOS PCT: 1.2 %
Basophils Absolute: 72 cells/uL (ref 0–200)
Eosinophils Absolute: 180 cells/uL (ref 15–500)
Eosinophils Relative: 3 %
HEMATOCRIT: 40.9 % (ref 35.0–45.0)
Hemoglobin: 13.6 g/dL (ref 11.7–15.5)
LYMPHS ABS: 1416 {cells}/uL (ref 850–3900)
MCH: 29.4 pg (ref 27.0–33.0)
MCHC: 33.3 g/dL (ref 32.0–36.0)
MCV: 88.3 fL (ref 80.0–100.0)
MPV: 11 fL (ref 7.5–12.5)
Monocytes Relative: 8.5 %
Neutro Abs: 3822 cells/uL (ref 1500–7800)
Neutrophils Relative %: 63.7 %
Platelets: 254 10*3/uL (ref 140–400)
RBC: 4.63 10*6/uL (ref 3.80–5.10)
RDW: 13.2 % (ref 11.0–15.0)
Total Lymphocyte: 23.6 %
WBC: 6 10*3/uL (ref 3.8–10.8)
WBCMIX: 510 {cells}/uL (ref 200–950)

## 2017-03-13 LAB — MAGNESIUM: Magnesium: 2 mg/dL (ref 1.5–2.5)

## 2017-03-13 LAB — INSULIN, RANDOM: Insulin: 12.2 u[IU]/mL (ref 2.0–19.6)

## 2017-03-13 LAB — TSH: TSH: 1.72 mIU/L (ref 0.40–4.50)

## 2017-03-13 LAB — VITAMIN D 25 HYDROXY (VIT D DEFICIENCY, FRACTURES): Vit D, 25-Hydroxy: 73 ng/mL (ref 30–100)

## 2017-03-16 ENCOUNTER — Encounter: Payer: Self-pay | Admitting: Internal Medicine

## 2017-03-20 ENCOUNTER — Telehealth: Payer: Self-pay | Admitting: Internal Medicine

## 2017-03-20 NOTE — Telephone Encounter (Signed)
#   vials:1 Ordered date:03/20/2017 Shipping Date:03/20/2017

## 2017-03-21 NOTE — Telephone Encounter (Signed)
#   Vials:1 Arrival Date:03/21/17 Lot #:[id number] Exp Date:01/2018

## 2017-03-25 ENCOUNTER — Other Ambulatory Visit: Payer: Self-pay | Admitting: Internal Medicine

## 2017-03-27 ENCOUNTER — Ambulatory Visit: Payer: Medicare Other

## 2017-03-27 ENCOUNTER — Ambulatory Visit (INDEPENDENT_AMBULATORY_CARE_PROVIDER_SITE_OTHER): Payer: Medicare Other | Admitting: Internal Medicine

## 2017-03-27 VITALS — BP 124/80 | HR 60 | Temp 97.3°F | Resp 16 | Ht 60.0 in | Wt 132.4 lb

## 2017-03-27 DIAGNOSIS — L570 Actinic keratosis: Secondary | ICD-10-CM

## 2017-03-27 DIAGNOSIS — I10 Essential (primary) hypertension: Secondary | ICD-10-CM | POA: Diagnosis not present

## 2017-03-27 NOTE — Progress Notes (Signed)
  Subjective:    Patient ID: Kathy Whitaker, female    DOB: [birth year], 84 y.o.   MRN: [id number]  HPI  This very nice 84 yo MWF with HTN, HLD, PreDM, Vit D Def and chronic Allergic Asthma returns for f/u and has a negative Hypertensive systems review. She does however have concerns about a non-healing pruritic skin lesion of her upper Left anterior chest.   Medication Sig  . budesonide (PULMICORT) 0.25 MG/2ML nebulizer solution Take 2 mLs (0.25 mg total) by nebulization daily. Once daily and will take again if needed  . calcium-vitamin D (CALCIUM 500/D) 500-200 MG-UNIT per tablet Take 1 tablet by mouth. At times  . Cholecalciferol (VITAMIN D PO) Take 2,000 Units by mouth.  . EPINEPHrine (EPIPEN JR) 0.15 MG/0.3ML injection Inject 0.15 mg into the muscle as needed.    . formoterol (PERFOROMIST) 20 MCG/2ML nebulizer solution Take 2 mLs (20 mcg total) by nebulization 2 (two) times daily.  . Ipratropium-Albuterol (COMBIVENT RESPIMAT) 20-100 MCG/ACT AERS respimat USE 1 INHALATION EVERY 6 HOURS AS NEEDED FOR WHEEZING  . levothyroxine (SYNTHROID, LEVOTHROID) 75 MCG tablet TAKE 1 TABLET DAILY  . loratadine (CLARITIN) 10 MG tablet Take 10 mg by mouth daily.    . sertraline (ZOLOFT) 50 MG tablet TAKE 1 TABLET DAILY  . simvastatin (ZOCOR) 40 MG tablet TAKE 1 TABLET AT BEDTIME   Medication Dose Route Frequency Provider  . Arvid Right injec   150 mg Subcuaneous Q14 Days Christinia Gully, MD   Allergies  Allergen Reactions  . Codeine Nausea Only  . Propoxyphene N-Acetaminophen Nausea Only  . Shellfish-Derived Products Rash   Past Medical History:  Diagnosis Date  . Asthma   . COPD (chronic obstructive pulmonary disease) ([place])   . Depression   . Dizziness   . Hyperlipemia   . Hypertension   . Hypothyroidism   . Prediabetes   . SVD (spontaneous vaginal delivery)    x 2  . Vitamin D deficiency    Past Surgical History:  Procedure Laterality Date  . DILATATION & CURRETTAGE/HYSTEROSCOPY WITH RESECTOCOPE  N/A 09/10/2013   Procedure: DILATATION & CURETTAGE/HYSTEROSCOPY WITH RESECTOCOPE;  Surgeon: Darlyn Chamber, MD;  Location: [facility];  Service: Gynecology;  Laterality: N/A;  . TONSILLECTOMY    . TUBAL LIGATION Bilateral 1970  . WISDOM TOOTH EXTRACTION     Review of Systems     10 point systems review negative except as above.    Objective:   Physical Exam  BP 124/80   Pulse 60   Temp (!) 97.3 F (36.3 C)   Resp 16   Ht 5' (1.524 m)   Wt 132 lb 6.4 oz (60.1 kg)   BMI 25.86 kg/m   HEENT - WNL. Neck - supple.  Chest - Clear equal BS. Cor - Nl HS. RRR w/o sig MGR. PP 1(+). No edema. MS- FROM w/o deformities.  Gait Nl. Neuro -  Nl w/o focal abnormalities.  Skin - There is a 10 x 12 mm superficially ulcerated and scaly pink lesion of the upper ant Left chest just inferior to the mid Lt clavicle.   Procedure (CPT: 69678)  After informed consent , the above lesion was treated by Cryosurg with Liq Noitrogen by a triple freeze/thaw technique.     Assessment & Plan:   1. Essential hypertension   2. Actinic keratosis  - Procedure: (CPT - 17000)

## 2017-03-27 NOTE — Patient Instructions (Signed)
Actinic Keratosis An actinic keratosis is a precancerous growth on the skin. This means that it could develop into skin cancer if it is not treated. About 1% of these growths (actinic keratoses) turn into skin cancer within one year if they are not treated. It is important to have all of these growths evaluated to determine the best treatment approach. What are the causes? This condition is caused by getting too much ultraviolet (UV) radiation from the sun or other UV light sources. What increases the risk? The following factors may make you more likely to develop this condition:  Having light-colored skin and blue eyes.  Having blonde or red hair.  Spending a lot of time in the sun.  Inadequate skin protection when outdoors. This may include: ? Not using sunscreen properly. ? Not covering up skin that is exposed to sunlight.  Aging. The risk of developing an actinic keratosis increases with age.  What are the signs or symptoms? Actinic keratoses look like scaly, rough spots of skin.They can be as small as a pinhead or as big as a quarter. They may itch, hurt, or feel sensitive. In most cases, the growths become red. In some cases, they may be skin-colored, light tan, dark tan, pink, or a combination of any of these colors. There may be a small piece of pink or gray skin (skin tag) growing from the actinic keratosis. In some cases, it may be easier to notice actinic keratoses by feeling them, rather than seeing them. Actinic keratoses appear most often on areas of skin that get a lot of sun exposure, including the scalp, face, ears, lips, upper back, forearms, and the backs of the hands. Sometimes, actinic keratoses disappear, but many reappear a few days to a few weeks later. How is this diagnosed? This condition is usually diagnosed with a physical exam. A tissue sample may be removed from the actinic keratosis and examined under a microscope (biopsy). How is this treated?  Treatment for  this condition may include:  Scraping off the actinic keratosis (curettage).  Freezing the actinic keratosis with liquid nitrogen (cryosurgery). This causes the growth to eventually fall off the skin.  Applying medicated creams or gels to destroy the cells in the growth.  Applying chemicals to the actinic keratosis to make the outer layers of skin peel off (chemical peel).  Photodynamic therapy. In this procedure, medicated cream is applied to the actinic keratosis. This cream increases your skin's sensitivity to light. Then, a strong light is aimed at the actinic keratosis to destroy cells in the growth.  Follow these instructions at home: Skin care  Apply cool, wet cloths (cool compresses) to the affected areas.  Do not scratch your skin.  Check your skin regularly for any growths, especially growths that: ? Start to itch or bleed. ? Change in size, shape, or color. Caring for the treated area  Keep the treated area clean and dry as told by your health care provider.  Do not apply any medicine, cream, or lotion to the treated area unless your health care provider tells you to do that.  Do not pick at blisters or try to break them open. This can cause infection and scarring.  If you have red or irritated skin after treatment, follow instructions from your health care provider about how to take care of the treated area. Make sure you: ? Wash your hands with soap and water before you change your bandage (dressing). If soap and water are not available, use  hand sanitizer. ? Change your dressing as told by your health care provider.  If you have red or irritated skin after treatment, check your treated area every day for signs of infection. Check for: ? Swelling, pain, or more redness. ? Fluid or blood. ? Warmth. ? Pus or a bad smell. General instructions  Take over-the-counter and prescription medicines only as told by your health care provider.  Return to your normal  activities as told by your health care provider. Ask your health care provider what activities are safe for you.  Do not use any tobacco products, such as cigarettes, chewing tobacco, and e-cigarettes. If you need help quitting, ask your health care provider.  Have a skin exam done every year by a health care provider who is a skin conditions specialist (dermatologist).  Keep all follow-up visits as told by your health care provider. This is important. How is this prevented?  Do not get sunburns.  Try to avoid the sun between 10:00 a.m. and 4:00 p.m. This is when the UV light is the strongest.  Use a sunscreen or sunblock with SPF 30 (sun protection factor 30) or greater.  Apply sunscreen before you are exposed to sunlight, and reapply periodically as often as directed by the instructions on the sunscreen container.  Always wear sunglasses that have UV protection, and always wear hats and clothing to protect your skin from sunlight.  When possible, avoid medicines that increase your sensitivity to sunlight. These include: ? Certain antibiotic medicines. ? Certain water pills (diuretics). ? Certain prescription medicines that are used to treat acne (retinoids).  Do not use tanning beds or other indoor tanning devices. Contact a health care provider if:  You notice any changes or new growths on your skin.  You have swelling, pain, or more redness around your treated area.  You have fluid or blood coming from your treated area.  Your treated area feels warm to the touch.  You have pus or a bad smell coming from your treated area.  You have a fever.  You have a blister that becomes large and painful. This information is not intended to replace advice given to you by your health care provider. Make sure you discuss any questions you have with your health care provider. Document Released: 04/19/2008 Document Revised: 09/22/2015 Document Reviewed: 10/01/2014 Elsevier Interactive  Patient Education  2018 [employer]  

## 2017-04-03 ENCOUNTER — Ambulatory Visit: Payer: Medicare Other | Admitting: Neurology

## 2017-04-07 ENCOUNTER — Ambulatory Visit (INDEPENDENT_AMBULATORY_CARE_PROVIDER_SITE_OTHER): Payer: Medicare Other

## 2017-04-07 DIAGNOSIS — J4540 Moderate persistent asthma, uncomplicated: Secondary | ICD-10-CM | POA: Diagnosis not present

## 2017-04-08 MED ORDER — OMALIZUMAB 150 MG ~~LOC~~ SOLR
150.0000 mg | SUBCUTANEOUS | Status: DC
  Administered 2017-04-07: 150 mg via SUBCUTANEOUS

## 2017-04-08 NOTE — Progress Notes (Signed)
Documentation of medication administration and charges of Xolair have been completed by Illana Nolting, CMA based on the Xolair documentation sheet completed by Tammy Scott.  

## 2017-05-12 ENCOUNTER — Telehealth: Payer: Self-pay | Admitting: *Deleted

## 2017-05-12 ENCOUNTER — Ambulatory Visit: Payer: Medicare Other

## 2017-05-12 NOTE — Telephone Encounter (Signed)
Prefilled Syringe: #150mg  1  #75mg  0 Ordered Date: 05/12/2017 Shipping Date: 05/12/2017

## 2017-05-13 NOTE — Telephone Encounter (Signed)
Prefilled Syringes: # 150mg  1  #75mg  0 Arrival Date:05/13/2017 Lot #: 150mg  [id number]      75mg  Exp Date: 150mg  01/2018   75mg 

## 2017-05-14 ENCOUNTER — Ambulatory Visit (INDEPENDENT_AMBULATORY_CARE_PROVIDER_SITE_OTHER): Payer: Medicare Other

## 2017-05-14 DIAGNOSIS — J4540 Moderate persistent asthma, uncomplicated: Secondary | ICD-10-CM

## 2017-05-15 DIAGNOSIS — J4540 Moderate persistent asthma, uncomplicated: Secondary | ICD-10-CM | POA: Diagnosis not present

## 2017-05-15 MED ORDER — OMALIZUMAB 150 MG ~~LOC~~ SOLR
150.0000 mg | SUBCUTANEOUS | Status: DC
  Administered 2017-05-15: 150 mg via SUBCUTANEOUS

## 2017-05-15 NOTE — Progress Notes (Signed)
Documentation of medication administration and charges of Xolair have been completed by Philbert Ocallaghan, CMA based on the Xolair documentation sheet completed by Ashley Caulfield, RMA. 

## 2017-05-19 ENCOUNTER — Other Ambulatory Visit: Payer: Self-pay | Admitting: *Deleted

## 2017-05-19 DIAGNOSIS — E039 Hypothyroidism, unspecified: Secondary | ICD-10-CM

## 2017-05-19 MED ORDER — SIMVASTATIN 40 MG PO TABS: 40.0000 mg | ORAL_TABLET | Freq: Every day | ORAL | 2 refills | Status: DC

## 2017-05-19 MED ORDER — SERTRALINE HCL 50 MG PO TABS: 50.0000 mg | ORAL_TABLET | Freq: Every day | ORAL | 2 refills | Status: DC

## 2017-05-19 MED ORDER — LEVOTHYROXINE SODIUM 75 MCG PO TABS: ORAL_TABLET | ORAL | 2 refills | Status: DC

## 2017-05-28 ENCOUNTER — Telehealth: Payer: Self-pay | Admitting: Internal Medicine

## 2017-05-28 NOTE — Telephone Encounter (Signed)
Prefilled Syringe: #150mg  1  #75mg  0 Ordered Date: 4.24.19 Shipping Date: 4.25.19

## 2017-05-29 NOTE — Telephone Encounter (Signed)
Prefilled Syringes: # 150mg  1  #75mg  0 Arrival Date: 05/29/17 Lot #: 150mg  [id number]      75mg  0 Exp Date: 150mg  01/2018   75mg  0

## 2017-06-10 ENCOUNTER — Ambulatory Visit: Payer: Self-pay | Admitting: Adult Health

## 2017-06-19 ENCOUNTER — Ambulatory Visit: Payer: Medicare Other

## 2017-06-26 ENCOUNTER — Ambulatory Visit (INDEPENDENT_AMBULATORY_CARE_PROVIDER_SITE_OTHER): Payer: Medicare Other

## 2017-06-26 DIAGNOSIS — J4540 Moderate persistent asthma, uncomplicated: Secondary | ICD-10-CM | POA: Diagnosis not present

## 2017-06-27 MED ORDER — OMALIZUMAB 150 MG ~~LOC~~ SOLR
150.0000 mg | SUBCUTANEOUS | Status: DC
  Administered 2017-06-26: 150 mg via SUBCUTANEOUS

## 2017-06-27 NOTE — Progress Notes (Signed)
Documentation of medication administration and charges of Xolair have been completed by Lindsay Lemons, CMA based on the Xolair documentation sheet completed by Tammy Scott.  

## 2017-07-24 ENCOUNTER — Telehealth: Payer: Self-pay | Admitting: *Deleted

## 2017-07-25 NOTE — Telephone Encounter (Addendum)
Prefilled Syringe: #150mg  1  #75mg  0 Ordered Date: 07/24/2017 Shipping Date: 07/24/2017 Kathy Whitaker is aware.

## 2017-07-25 NOTE — Telephone Encounter (Signed)
Prefilled Syringes: # 150mg  1  #75mg  0 Arrival Date: 07/25/17 Lot #: 150mg  [id number]      75mg  0 Exp Date: 150mg  02/2018   75mg  0

## 2017-07-30 ENCOUNTER — Ambulatory Visit: Payer: Medicare Other

## 2017-07-31 ENCOUNTER — Ambulatory Visit: Payer: Medicare Other

## 2017-07-31 ENCOUNTER — Ambulatory Visit (INDEPENDENT_AMBULATORY_CARE_PROVIDER_SITE_OTHER): Payer: Medicare Other

## 2017-07-31 DIAGNOSIS — J4540 Moderate persistent asthma, uncomplicated: Secondary | ICD-10-CM | POA: Diagnosis not present

## 2017-08-05 MED ORDER — OMALIZUMAB 150 MG ~~LOC~~ SOLR
150.0000 mg | Freq: Once | SUBCUTANEOUS | Status: AC
  Administered 2017-07-31: 150 mg via SUBCUTANEOUS

## 2017-08-26 ENCOUNTER — Telehealth: Payer: Self-pay | Admitting: Internal Medicine

## 2017-08-27 ENCOUNTER — Telehealth: Payer: Self-pay | Admitting: Internal Medicine

## 2017-08-27 ENCOUNTER — Other Ambulatory Visit: Payer: Self-pay

## 2017-08-27 DIAGNOSIS — E039 Hypothyroidism, unspecified: Secondary | ICD-10-CM

## 2017-08-27 MED ORDER — LEVOTHYROXINE SODIUM 75 MCG PO TABS: ORAL_TABLET | ORAL | 2 refills | Status: DC

## 2017-08-27 NOTE — Telephone Encounter (Signed)
Prefilled Syringes: # 150mg  1  #75mg  0 Arrival Date: 08/27/17 Lot #: 150mg  [id number]      75mg  0 Exp Date: 150mg  02/2018   75mg  0

## 2017-08-27 NOTE — Telephone Encounter (Signed)
ERROR

## 2017-08-27 NOTE — Telephone Encounter (Signed)
Error

## 2017-08-27 NOTE — Telephone Encounter (Signed)
Prefilled Syringe: #150mg  1  #75mg  0 Ordered Date: 08/26/17 Shipping Date: 08/26/17

## 2017-09-01 ENCOUNTER — Ambulatory Visit (INDEPENDENT_AMBULATORY_CARE_PROVIDER_SITE_OTHER): Payer: Medicare Other

## 2017-09-01 DIAGNOSIS — J4540 Moderate persistent asthma, uncomplicated: Secondary | ICD-10-CM | POA: Diagnosis not present

## 2017-09-02 MED ORDER — OMALIZUMAB 150 MG ~~LOC~~ SOLR
150.0000 mg | SUBCUTANEOUS | Status: DC
  Administered 2017-09-01 – 2017-11-18 (×2): 150 mg via SUBCUTANEOUS

## 2017-09-02 NOTE — Progress Notes (Signed)
Documentation of medication administration and charges of Xolair have been completed by Koralynn Greenspan, CMA based on the Xolair documentation sheet completed by Tammy Scott.  

## 2017-09-10 ENCOUNTER — Ambulatory Visit: Payer: Self-pay | Admitting: Internal Medicine

## 2017-09-17 ENCOUNTER — Telehealth: Payer: Self-pay | Admitting: Internal Medicine

## 2017-09-17 NOTE — Telephone Encounter (Signed)
Prefilled Syringe: #150mg  1  #75mg  0 Ordered Date: 09/17/17 Shipping Date: 09/17/17

## 2017-09-18 NOTE — Telephone Encounter (Signed)
Prefilled Syringes: # 150mg  1  #75mg  0 Arrival Date: 09/18/17 Lot #: 150mg  [id number]      75mg  0 Exp Date: 150mg  03/2018   75mg  0

## 2017-09-23 ENCOUNTER — Ambulatory Visit (INDEPENDENT_AMBULATORY_CARE_PROVIDER_SITE_OTHER): Payer: Medicare Other | Admitting: Internal Medicine

## 2017-09-23 VITALS — BP 100/54 | HR 68 | Temp 97.2°F | Resp 16 | Ht 60.0 in | Wt 130.6 lb

## 2017-09-23 DIAGNOSIS — Z79899 Other long term (current) drug therapy: Secondary | ICD-10-CM | POA: Diagnosis not present

## 2017-09-23 DIAGNOSIS — R7303 Prediabetes: Secondary | ICD-10-CM

## 2017-09-23 DIAGNOSIS — E559 Vitamin D deficiency, unspecified: Secondary | ICD-10-CM | POA: Diagnosis not present

## 2017-09-23 DIAGNOSIS — R7309 Other abnormal glucose: Secondary | ICD-10-CM

## 2017-09-23 DIAGNOSIS — E039 Hypothyroidism, unspecified: Secondary | ICD-10-CM | POA: Diagnosis not present

## 2017-09-23 DIAGNOSIS — Z136 Encounter for screening for cardiovascular disorders: Secondary | ICD-10-CM

## 2017-09-23 DIAGNOSIS — E782 Mixed hyperlipidemia: Secondary | ICD-10-CM | POA: Diagnosis not present

## 2017-09-23 DIAGNOSIS — I10 Essential (primary) hypertension: Secondary | ICD-10-CM

## 2017-09-23 DIAGNOSIS — J4540 Moderate persistent asthma, uncomplicated: Secondary | ICD-10-CM | POA: Diagnosis not present

## 2017-09-23 NOTE — Progress Notes (Signed)
This very nice 84 y.o.  MWF  presents for 6 month follow up with HTN, HLD, Pre-Diabetes and Vitamin D Deficiency. Patient has been on Xolair injections for Allergic Asthma  since 2007 per [name].      Patient is treated for HTN (2004)  & BP has been controlled at home. Today's BP is at goal at 100/54. She had a negative stress Myoview in 2013. Patient has had no complaints of any cardiac type chest pain, palpitations, dyspnea / orthopnea / PND, dizziness, claudication, or dependent edema.     Hyperlipidemia is controlled with diet & meds. Patient denies myalgias or other med SE's. Last Lipids were not at goal: Lab Results  Component Value Date   CHOL 193 03/12/2017   HDL 70 03/12/2017   LDLCALC 106 (H) 03/12/2017   TRIG 79 03/12/2017   CHOLHDL 2.8 03/12/2017      Patient has been on Thyroid Replacement since the 1980's     Also, the patient has history of PreDiabetes (A1c 6.0%/2011, then 5.7%/2012) and has had no symptoms of reactive hypoglycemia, diabetic polys, paresthesias or visual blurring.  Last A1c was Normal & at goal: Lab Results  Component Value Date   HGBA1C 5.5 03/12/2017      Further, the patient also has history of Vitamin D Deficiency and supplements vitamin D without any suspected side-effects. Last vitamin D was at goal:   Lab Results  Component Value Date   VD25OH 73 03/12/2017   Current Outpatient Medications on File Prior to Visit  Medication Sig  . budesonide  0.25 MG/2ML neb soln Take 2 mLs by neb daily.  [name] VITAMIN D Take 2,000 Units by mouth.  . EPIPEN JR 0.15 MG injec Inject 0.15 mg into the muscle as needed.    . formoterol 20 MCG/2ML neb soln Take 2 mLs by neb 2 x times daily.  . COMBIVENT RESPIMAT 20-100 USE 1 INHALEVERY 6 HOURS   . levothyroxine  75 MCG tablet TAKE 1 TABLET DAILY  . loratadine  10 MG tablet Take 10 mg by mouth daily.    . sertraline 50 MG tablet Take 1 tablet (50 mg total) by mouth daily.  . simvastatin  40 MG tablet Take 1  tablet (40 mg total) by mouth at bedtime.   Allergies  Allergen Reactions  . Codeine Nausea Only  . Propoxyphene N-Acetaminophen Nausea Only  . Shellfish-Derived Products Rash   PMHx:   Past Medical History:  Diagnosis Date  . Asthma   . COPD (chronic obstructive pulmonary disease) ([place])   . Depression   . Dizziness   . Hyperlipemia   . Hypertension   . Hypothyroidism   . Prediabetes   . SVD (spontaneous vaginal delivery)    x 2  . Vitamin D deficiency    Immunization History  Administered Date(s) Administered  . Influenza Whole 10/05/2009, 11/26/2010, 11/05/2011  . Influenza, High Dose Seasonal PF 11/20/2012, 11/22/2013, 11/21/2014, 11/10/2015, 11/19/2016  . Pneumococcal Conjugate-13 11/22/2013  . Pneumococcal Polysaccharide-23 09/14/2008  . Td 10/09/2004, 03/12/2017  . Zoster 02/15/2013   Past Surgical History:  Procedure Laterality Date  . DILATATION & CURRETTAGE/HYSTEROSCOPY WITH RESECTOCOPE N/A 09/10/2013   Procedure: DILATATION & CURETTAGE/HYSTEROSCOPY WITH RESECTOCOPE;  Surgeon: Darlyn Chamber, MD;  Location: [facility];  Service: Gynecology;  Laterality: N/A;  . TONSILLECTOMY    . TUBAL LIGATION Bilateral 1970  . WISDOM TOOTH EXTRACTION     FHx:    Reviewed / unchanged  SHx:  Reviewed / unchanged   Systems Review:  Constitutional: Denies fever, chills, wt changes, headaches, insomnia, fatigue, night sweats, change in appetite. Eyes: Denies redness, blurred vision, diplopia, discharge, itchy, watery eyes.  ENT: Denies discharge, congestion, post nasal drip, epistaxis, sore throat, earache, hearing loss, dental pain, tinnitus, vertigo, sinus pain, snoring.  CV: Denies chest pain, palpitations, irregular heartbeat, syncope, dyspnea, diaphoresis, orthopnea, PND, claudication or edema. Respiratory: denies cough, dyspnea, DOE, pleurisy, hoarseness, laryngitis, wheezing.  Gastrointestinal: Denies dysphagia, odynophagia, heartburn, reflux, water brash, abdominal pain or  cramps, nausea, vomiting, bloating, diarrhea, constipation, hematemesis, melena, hematochezia  or hemorrhoids. Genitourinary: Denies dysuria, frequency, urgency, nocturia, hesitancy, discharge, hematuria or flank pain. Musculoskeletal: Denies arthralgias, myalgias, stiffness, jt. swelling, pain, limping or strain/sprain.  Skin: Denies pruritus, rash, hives, warts, acne, eczema or change in skin lesion(s). Neuro: No weakness, tremor, incoordination, spasms, paresthesia or pain. Psychiatric: Denies confusion, memory loss or sensory loss. Endo: Denies change in weight, skin or hair change.  Heme/Lymph: No excessive bleeding, bruising or enlarged lymph nodes.  Physical Exam  BP (!) 100/54   Pulse 68   Temp (!) 97.2 F (36.2 C)   Resp 16   Ht 5' (1.524 m)   Wt 130 lb 9.6 oz (59.2 kg)   BMI 25.51 kg/m   Appears  well nourished, well groomed  and in no distress.  Eyes: PERRLA, EOMs, conjunctiva no swelling or erythema. Sinuses: No frontal/maxillary tenderness ENT/Mouth: EAC's clear, TM's nl w/o erythema, bulging. Nares clear w/o erythema, swelling, exudates. Oropharynx clear without erythema or exudates. Oral hygiene is good. Tongue normal, non obstructing. Hearing intact.  Neck: Supple. Thyroid not palpable. Car 2+/2+ without bruits, nodes or JVD. Chest: Respirations nl with BS clear & equal w/o rales, rhonchi, wheezing or stridor.  Cor: Heart sounds normal w/ regular rate and rhythm without sig. murmurs, gallops, clicks or rubs. Peripheral pulses normal and equal  without edema.  Abdomen: Soft & bowel sounds normal. Non-tender w/o guarding, rebound, hernias, masses or organomegaly.  Lymphatics: Unremarkable.  Musculoskeletal: Full ROM all peripheral extremities, joint stability, 5/5 strength and normal gait.  Skin: Warm, dry without exposed rashes, lesions or ecchymosis apparent.  Neuro: Cranial nerves intact, reflexes equal bilaterally. Sensory-motor testing grossly intact. Tendon  reflexes grossly intact.  Pysch: Alert & oriented x 3.  Insight and judgement nl & appropriate. No ideations.  Assessment and Plan:  1. Essential hypertension  - Continue medication, monitor blood pressure at home.  - Continue DASH diet.  Reminder to go to the ER if any CP,  SOB, nausea, dizziness, severe HA, changes vision/speech.  - CBC with Differential/Platelet - COMPLETE METABOLIC PANEL WITH GFR - Magnesium - TSH  2. Hyperlipidemia, mixed  - Continue diet/meds, exercise,& lifestyle modifications.  - Continue monitor periodic cholesterol/liver & renal functions   - Lipid panel - TSH  3. Abnormal glucose  - Continue diet, exercise, lifestyle modifications.  - Monitor appropriate labs.  - Hemoglobin A1c - Insulin, random  4. Vitamin D deficiency  - Continue supplementation.  - VITAMIN D 25 Hydroxyl  5. Prediabetes  - Hemoglobin A1c - Insulin, random  6. Hypothyroidism  - TSH  7. Extrinsic asthma   8. Medication management  - CBC with Differential/Platelet - COMPLETE METABOLIC PANEL WITH GFR - Magnesium - Lipid panel - TSH - Hemoglobin A1c - Insulin, random - VITAMIN D 25 Hydroxyl      Discussed  regular exercise, BP monitoring, weight control to achieve/maintain BMI less than 25 and discussed med and  SE's. Recommended labs to assess and monitor clinical status with further disposition pending results of labs. Over 30 minutes of exam, counseling, chart review was performed.

## 2017-09-23 NOTE — Patient Instructions (Addendum)
Recommend Adult Low Dose Aspirin or  coated  Aspirin 81 mg daily  To reduce risk of Colon Cancer 20 %,  Skin Cancer 26 % ,  Melanoma 46%  and  Pancreatic cancer 60% +++++++++++++++++++++++++ Vitamin D goal  is between 70-100.  Please make sure that you are taking your Vitamin D as directed.  It is very important as a natural anti-inflammatory  helping hair, skin, and nails, as well as reducing stroke and heart attack risk.  It helps your bones and helps with mood. It also decreases numerous cancer risks so please take it as directed.  Low Vit D is associated with a 200-300% higher risk for CANCER  and 200-300% higher risk for HEART   ATTACK  &  STROKE.   ...................................... It is also associated with higher death rate at younger ages,  autoimmune diseases like Rheumatoid arthritis, Lupus, Multiple Sclerosis.    Also many other serious conditions, like depression, Alzheimer's Dementia, infertility, muscle aches, fatigue, fibromyalgia - just to name a few. ++++++++++++++++++++ Recommend the book "The END of DIETING" by Dr Joel Fuhrman  & the book "The END of DIABETES " by Dr Joel Fuhrman At [employer] - get book & Audio CD's    Being diabetic has a  300% increased risk for heart attack, stroke, cancer, and alzheimer- type vascular dementia. It is very important that you work harder with diet by avoiding all foods that are white. Avoid white rice (brown & wild rice is OK), white potatoes (sweetpotatoes in moderation is OK), White bread or wheat bread or anything made out of white flour like bagels, donuts, rolls, buns, biscuits, cakes, pastries, cookies, pizza crust, and pasta (made from white flour & egg whites) - vegetarian pasta or spinach or wheat pasta is OK. Multigrain breads like Arnold's or Pepperidge [employer], or multigrain sandwich thins or flatbreads.  Diet, exercise and weight loss can reverse and cure diabetes in the early stages.  Diet, exercise and weight loss is  very important in the control and prevention of complications of diabetes which affects every system in your body, ie. Brain - dementia/stroke, eyes - glaucoma/blindness, heart - heart attack/heart failure, kidneys - dialysis, stomach - gastric paralysis, intestines - malabsorption, nerves - severe painful neuritis, circulation - gangrene & loss of a leg(s), and finally cancer and Alzheimers.    I recommend avoid fried & greasy foods,  sweets/candy, white rice (brown or wild rice or Quinoa is OK), white potatoes (sweet potatoes are OK) - anything made from white flour - bagels, doughnuts, rolls, buns, biscuits,white and wheat breads, pizza crust and traditional pasta made of white flour & egg white(vegetarian pasta or spinach or wheat pasta is OK).  Multi-grain bread is OK - like multi-grain flat bread or sandwich thins. Avoid alcohol in excess. Exercise is also important.    Eat all the vegetables you want - avoid meat, especially red meat and dairy - especially cheese.  Cheese is the most concentrated form of trans-fats which is the worst thing to clog up our arteries. Veggie cheese is OK which can be found in the fresh produce section at [employer] or [employer] or [employer]  +++++++++++++++++++++ DASH Eating Plan  DASH stands for "Dietary Approaches to Stop Hypertension."   The DASH eating plan is a healthy eating plan that has been shown to reduce high blood pressure (hypertension). Additional health benefits may include reducing the risk of type 2 diabetes mellitus, heart disease, and stroke. The DASH eating plan may also   help with weight loss. WHAT DO I NEED TO KNOW ABOUT THE DASH EATING PLAN? For the DASH eating plan, you will follow these general guidelines:  Choose foods with a percent daily value for sodium of less than 5% (as listed on the food label).  Use salt-free seasonings or herbs instead of table salt or sea salt.  Check with your health care provider or pharmacist before  using salt substitutes.  Eat lower-sodium products, often labeled as "lower sodium" or "no salt added."  Eat fresh foods.  Eat more vegetables, fruits, and low-fat dairy products.  Choose whole grains. Look for the word "whole" as the first word in the ingredient list.  Choose fish   Limit sweets, desserts, sugars, and sugary drinks.  Choose heart-healthy fats.  Eat veggie cheese   Eat more home-cooked food and less restaurant, buffet, and fast food.  Limit fried foods.  Cook foods using methods other than frying.  Limit canned vegetables. If you do use them, rinse them well to decrease the sodium.  When eating at a restaurant, ask that your food be prepared with less salt, or no salt if possible.                      WHAT FOODS CAN I EAT? Read Dr Joel Fuhrman's books on The End of Dieting & The End of Diabetes  Grains Whole grain or whole wheat bread. Brown rice. Whole grain or whole wheat pasta. Quinoa, bulgur, and whole grain cereals. Low-sodium cereals. Corn or whole wheat flour tortillas. Whole grain cornbread. Whole grain crackers. Low-sodium crackers.  Vegetables Fresh or frozen vegetables (raw, steamed, roasted, or grilled). Low-sodium or reduced-sodium tomato and vegetable juices. Low-sodium or reduced-sodium tomato sauce and paste. Low-sodium or reduced-sodium canned vegetables.   Fruits All fresh, canned (in natural juice), or frozen fruits.  Protein Products  All fish and seafood.  Dried beans, peas, or lentils. Unsalted nuts and seeds. Unsalted canned beans.  Dairy Low-fat dairy products, such as skim or 1% milk, 2% or reduced-fat cheeses, low-fat ricotta or cottage cheese, or plain low-fat yogurt. Low-sodium or reduced-sodium cheeses.  Fats and Oils Tub margarines without trans fats. Light or reduced-fat mayonnaise and salad dressings (reduced sodium). Avocado. Safflower, olive, or canola oils. Natural peanut or almond butter.  Other Unsalted popcorn  and pretzels. The items listed above may not be a complete list of recommended foods or beverages. Contact your dietitian for more options.  +++++++++++++++  WHAT FOODS ARE NOT RECOMMENDED? Grains/ White flour or wheat flour White bread. White pasta. White rice. Refined cornbread. Bagels and croissants. Crackers that contain trans fat.  Vegetables  Creamed or fried vegetables. Vegetables in a . Regular canned vegetables. Regular canned tomato sauce and paste. Regular tomato and vegetable juices.  Fruits Dried fruits. Canned fruit in light or heavy syrup. Fruit juice.  Meat and Other Protein Products Meat in general - RED meat & White meat.  Fatty cuts of meat. Ribs, chicken wings, all processed meats as bacon, sausage, bologna, salami, fatback, hot dogs, bratwurst and packaged luncheon meats.  Dairy Whole or 2% milk, cream, half-and-half, and cream cheese. Whole-fat or sweetened yogurt. Full-fat cheeses or blue cheese. Non-dairy creamers and whipped toppings. Processed cheese, cheese spreads, or cheese curds.  Condiments Onion and garlic salt, seasoned salt, table salt, and sea salt. Canned and packaged gravies. Worcestershire sauce. Tartar sauce. Barbecue sauce. Teriyaki sauce. Soy sauce, including reduced sodium. Steak sauce. Fish sauce. Oyster sauce. Cocktail   sauce. Horseradish. Ketchup and mustard. Meat flavorings and tenderizers. Bouillon cubes. Hot sauce. Tabasco sauce. Marinades. Taco seasonings. Relishes.  Fats and Oils Butter, stick margarine, lard, shortening and bacon fat. Coconut, palm kernel, or palm oils. Regular salad dressings.  Pickles and olives. Salted popcorn and pretzels.

## 2017-09-24 LAB — COMPLETE METABOLIC PANEL WITH GFR
AG RATIO: 1.6 (calc) (ref 1.0–2.5)
ALKALINE PHOSPHATASE (APISO): 87 U/L (ref 33–130)
ALT: 11 U/L (ref 6–29)
AST: 18 U/L (ref 10–35)
Albumin: 4.2 g/dL (ref 3.6–5.1)
BUN: 16 mg/dL (ref 7–25)
CALCIUM: 9.7 mg/dL (ref 8.6–10.4)
CHLORIDE: 103 mmol/L (ref 98–110)
CO2: 30 mmol/L (ref 20–32)
Creat: 0.88 mg/dL (ref 0.60–0.88)
GFR, EST NON AFRICAN AMERICAN: 62 mL/min/{1.73_m2} (ref >=60)
GFR, Est African American: 71 mL/min/{1.73_m2} (ref >=60)
GLOBULIN: 2.7 g/dL (ref 1.9–3.7)
Glucose, Bld: 105 mg/dL — ABNORMAL HIGH (ref 65–99)
POTASSIUM: 4.4 mmol/L (ref 3.5–5.3)
Sodium: 142 mmol/L (ref 135–146)
Total Bilirubin: 0.4 mg/dL (ref 0.2–1.2)
Total Protein: 6.9 g/dL (ref 6.1–8.1)

## 2017-09-24 LAB — CBC WITH DIFFERENTIAL/PLATELET
BASOS ABS: 72 {cells}/uL (ref 0–200)
Basophils Relative: 1.1 %
EOS PCT: 2.5 %
Eosinophils Absolute: 163 cells/uL (ref 15–500)
HCT: 39.8 % (ref 35.0–45.0)
Hemoglobin: 13.1 g/dL (ref 11.7–15.5)
Lymphs Abs: 1827 cells/uL (ref 850–3900)
MCH: 29.6 pg (ref 27.0–33.0)
MCHC: 32.9 g/dL (ref 32.0–36.0)
MCV: 90 fL (ref 80.0–100.0)
MPV: 11.7 fL (ref 7.5–12.5)
Monocytes Relative: 9.3 %
NEUTROS PCT: 59 %
Neutro Abs: 3835 cells/uL (ref 1500–7800)
PLATELETS: 268 10*3/uL (ref 140–400)
RBC: 4.42 10*6/uL (ref 3.80–5.10)
RDW: 12.9 % (ref 11.0–15.0)
TOTAL LYMPHOCYTE: 28.1 %
WBC: 6.5 10*3/uL (ref 3.8–10.8)
WBCMIX: 605 {cells}/uL (ref 200–950)

## 2017-09-24 LAB — VITAMIN D 25 HYDROXY (VIT D DEFICIENCY, FRACTURES): VIT D 25 HYDROXY: 68 ng/mL (ref 30–100)

## 2017-09-24 LAB — TSH: TSH: 1.1 m[IU]/L (ref 0.40–4.50)

## 2017-09-24 LAB — HEMOGLOBIN A1C
EAG (MMOL/L): 6.2 (calc)
Hgb A1c MFr Bld: 5.5 % of total Hgb (ref <5.7)
Mean Plasma Glucose: 111 (calc)

## 2017-09-24 LAB — LIPID PANEL
CHOL/HDL RATIO: 2.3 (calc) (ref <5.0)
CHOLESTEROL: 174 mg/dL (ref <200)
HDL: 77 mg/dL (ref >50)
LDL Cholesterol (Calc): 76 mg/dL (calc)
Non-HDL Cholesterol (Calc): 97 mg/dL (calc) (ref <130)
Triglycerides: 127 mg/dL (ref <150)

## 2017-09-24 LAB — MAGNESIUM: MAGNESIUM: 2.1 mg/dL (ref 1.5–2.5)

## 2017-09-24 LAB — INSULIN, RANDOM: INSULIN: 11.9 u[IU]/mL (ref 2.0–19.6)

## 2017-09-28 ENCOUNTER — Encounter: Payer: Self-pay | Admitting: Internal Medicine

## 2017-10-14 DIAGNOSIS — Z6825 Body mass index (BMI) 25.0-25.9, adult: Secondary | ICD-10-CM | POA: Diagnosis not present

## 2017-10-14 DIAGNOSIS — Z1231 Encounter for screening mammogram for malignant neoplasm of breast: Secondary | ICD-10-CM | POA: Diagnosis not present

## 2017-10-14 DIAGNOSIS — Z01419 Encounter for gynecological examination (general) (routine) without abnormal findings: Secondary | ICD-10-CM | POA: Diagnosis not present

## 2017-10-15 ENCOUNTER — Ambulatory Visit (INDEPENDENT_AMBULATORY_CARE_PROVIDER_SITE_OTHER): Payer: Medicare Other

## 2017-10-15 DIAGNOSIS — J4540 Moderate persistent asthma, uncomplicated: Secondary | ICD-10-CM | POA: Diagnosis not present

## 2017-10-15 MED ORDER — OMALIZUMAB 150 MG ~~LOC~~ SOLR
150.0000 mg | Freq: Once | SUBCUTANEOUS | Status: AC
  Administered 2017-10-15: 150 mg via SUBCUTANEOUS

## 2017-11-03 ENCOUNTER — Other Ambulatory Visit: Payer: Self-pay | Admitting: Internal Medicine

## 2017-11-03 MED ORDER — MECLIZINE HCL 25 MG PO TABS: ORAL_TABLET | ORAL | 11 refills | Status: DC

## 2017-11-13 ENCOUNTER — Telehealth: Payer: Self-pay | Admitting: Internal Medicine

## 2017-11-13 NOTE — Telephone Encounter (Signed)
Prefilled Syringe: #150mg  1  #75mg   Ordered Date: 11/13/17 Shipping Date: 11/14/17

## 2017-11-14 NOTE — Telephone Encounter (Signed)
Prefilled Syringes: # 150mg  1   Arrival Date:10.11.19 Lot #: 150mg  [id number] Exp Date: 150mg  05/2018

## 2017-11-18 ENCOUNTER — Encounter: Payer: Self-pay | Admitting: Internal Medicine

## 2017-11-18 ENCOUNTER — Ambulatory Visit (INDEPENDENT_AMBULATORY_CARE_PROVIDER_SITE_OTHER): Payer: Medicare Other | Admitting: Internal Medicine

## 2017-11-18 ENCOUNTER — Ambulatory Visit (INDEPENDENT_AMBULATORY_CARE_PROVIDER_SITE_OTHER): Payer: Medicare Other

## 2017-11-18 VITALS — BP 118/78 | HR 60 | Ht 60.0 in | Wt 127.2 lb

## 2017-11-18 DIAGNOSIS — J4540 Moderate persistent asthma, uncomplicated: Secondary | ICD-10-CM

## 2017-11-18 LAB — NITRIC OXIDE: NITRIC OXIDE: 27

## 2017-11-18 NOTE — Patient Instructions (Signed)
Combine your nebulizer solutions and take each am plus second treatment in pm if needed.  Work on inhaler technique:  relax and gently blow all the way out then take a nice smooth deep breath back in, triggering the inhaler at same time you start breathing in.  Hold for up to 5 seconds if you can. Blow out thru nose. Rinse and gargle with water when done  Only use your a combivent as a rescue medication to be used if you can't catch your breath by resting or doing a relaxed purse lip breathing pattern.  - The less you use it, the better it will work when you need it. - Ok to use up to 1 puffs  every 4 hours if you must but call for immediate appointment if use goes up over your usual need - Don't leave home without it !!  (think of it like the spare tire for your car)   Please schedule a follow up visit in 12 months but call sooner if needed

## 2017-11-18 NOTE — Progress Notes (Signed)
Documented by Tacia Hindley CMA based on hand-written Xolair documentation sheet completed by Tammy Scott CMA, who administered the medication.  

## 2017-11-18 NOTE — Progress Notes (Addendum)
Subjective:     Patient ID: Kathy Whitaker, female   DOB: 1933/12/01   MRN: [id number]    Brief patient profile:  75  yowf never smoker with h/o allergic rhinitis/ moderate chronic asthma dating back to  Her late 24s and much better on xolair/performist/budesonide previously under Dr Bettina Gavia care  - on [place] since around 2009 / reduced to monthly rx 04/2015 by Dr Annamaria Boots      History of Present Illness 05/02/2015 1st   office visit/ [name] / transition of care from Dr Joya Gaskins  Chief Complaint  Patient presents with  . Follow-up    Former PW pt. Pt c/o occasional SOB with exertion. Pt states that her breathing is doing well. She recently had a cold but is improving. Pt denies cough/wheeze/CP/tightness. Pt is on Xolair and reports no complications/reactions.   maint rx with performist/budesonide rarely need combivent (very poor hfa noted - see a/p) Says other forms of ICS don't work. Concerned about longterm effects of meds  rec Combine your nebulizer solutions and take each am plus second treatment in pm if needed. Work on Doctor, hospital.     11/18/2017  f/u ov/Lezly Rumpf re:  Asthma/ rhinitis on xolair monthly and performist/bud daily  Chief Complaint  Patient presents with  . Follow-up    Last seen in 2017.  She is here just for f/u so she can continue on xolair. She denies any co's. She rarely uses her combivent for rescue.   Dyspnea:  Doe x steps on or off xolair (says worse when late for a single dose)  Cough: not a problem Sleeping: fine flat  SABA use:  None / poor understanding of smi technique for combivent  02: none    No obvious day to day or daytime variability or assoc excess/ purulent sputum or mucus plugs or hemoptysis or cp or chest tightness, subjective wheeze or overt sinus or hb symptoms.   Sleeps  without nocturnal  or early am exacerbation  of respiratory  c/o's or need for noct saba. Also denies any obvious fluctuation of symptoms with weather or environmental changes or  other aggravating or alleviating factors except as outlined above   No unusual exposure hx or h/o childhood pna/ asthma or knowledge of premature birth.  Current Allergies, Complete Past Medical History, Past Surgical History, Family History, and Social History were reviewed in [facility] record.  ROS  The following are not active complaints unless bolded Hoarseness, sore throat, dysphagia, dental problems, itching, sneezing,  nasal congestion or discharge of excess mucus or purulent secretions, ear ache,   fever, chills, sweats, unintended wt loss or wt gain, classically pleuritic or exertional cp,  orthopnea pnd or arm/hand swelling  or leg swelling, presyncope, palpitations, abdominal pain, anorexia, nausea, vomiting, diarrhea  or change in bowel habits or change in bladder habits, change in stools or change in urine, dysuria, hematuria,  rash, arthralgias, visual complaints, headache, numbness, weakness or ataxia or problems with walking or coordination,  change in mood= anxious or  Memory. New postional vertigo x sev weeks         Current Meds  Medication Sig  . budesonide (PULMICORT) 0.25 MG/2ML nebulizer solution Take 2 mLs (0.25 mg total) by nebulization daily. Once daily and will take again if needed  . Cholecalciferol (VITAMIN D PO) Take 2,000 Units by mouth.  . EPINEPHrine (EPIPEN JR) 0.15 MG/0.3ML injection Inject 0.15 mg into the muscle as needed.    . formoterol (PERFOROMIST) 20 MCG/2ML  nebulizer solution Take 2 mLs (20 mcg total) by nebulization 2 (two) times daily.  . Ipratropium-Albuterol (COMBIVENT RESPIMAT) 20-100 MCG/ACT AERS respimat USE 1 INHALATION EVERY 6 HOURS AS NEEDED FOR WHEEZING  . levothyroxine (SYNTHROID, LEVOTHROID) 75 MCG tablet TAKE 1 TABLET DAILY  . loratadine (CLARITIN) 10 MG tablet Take 10 mg by mouth daily.    . meclizine (ANTIVERT) 25 MG tablet Take 1/2 to 1 tablet 3 x /day as needed for dizziness / Vertigo  . sertraline (ZOLOFT) 50  MG tablet Take 1 tablet (50 mg total) by mouth daily.  . simvastatin (ZOCOR) 40 MG tablet Take 1 tablet (40 mg total) by mouth at bedtime.                Objective:   Physical Exam   amb pleasant wf nad   11/18/2017     127   05/02/15 136 lb (61.689 kg)  11/29/14 136 lb (61.689 kg)  03/23/14 138 lb (62.596 kg)     Vital signs reviewed - Note on arrival 02 sats  95% on RA   HEENT: nl dentition, turbinates bilaterally, and oropharynx. Nl external ear canals without cough reflex   NECK :  without JVD/Nodes/TM/ nl carotid upstrokes bilaterally   LUNGS: no acc muscle use,  Nl contour chest which is clear to A and P bilaterally without cough on insp or exp maneuvers   CV:  RRR  no s3 or murmur or increase in P2, and no edema   ABD:  soft and nontender with nl inspiratory excursion in the supine position. No bruits or organomegaly appreciated, bowel sounds nl  MS:  Nl gait/ ext warm without deformities, calf tenderness, cyanosis or clubbing No obvious joint restrictions   SKIN: warm and dry without lesions    NEURO:  alert, approp, nl sensorium with  no motor or cerebellar deficits apparent.          Assessment:

## 2017-11-19 ENCOUNTER — Encounter: Payer: Self-pay | Admitting: Internal Medicine

## 2017-11-19 ENCOUNTER — Ambulatory Visit: Payer: Medicare Other | Admitting: Internal Medicine

## 2017-11-19 NOTE — Assessment & Plan Note (Signed)
Xolair rx since at least 2009 per EMR  - Spirometry 07/05/08  FEV1 1.60 (100%)  Ratio 62 p 15% resp to saba   - changed to monthly  interval per Dr Annamaria Boots rec as of 05/02/2015    - Spirometry 11/18/2017  FEV1 1,0  (68%)  Ratio 61  p am formoterol/bud - FENO 11/18/2017  =   29 on bud neb   She has chronic doe = MMRC1 = can walk nl pace, flat grade, can't hurry or go uphills or steps s sob  With pfts c/w with mod asthma though may have fixed component but not using bid neb so if any worse sob should certainly increase the neb to bid as previously instructed.  Whether to continue xolair beyond 10 years at age 33 is totally empirical rec at this point as no data on this > my rec is that as long as it's being reimbursed given severity of dz reasonable to continue monthly    I had an extended discussion with the patient reviewing all relevant studies completed to date and  lasting 15 to 20 minutes of a 25 minute visit    See device teaching which extended face to face time for this visit.  Each maintenance medication was reviewed in detail including emphasizing most importantly the difference between maintenance and prns and under what circumstances the prns are to be triggered using an action plan format that is not reflected in the computer generated alphabetically organized AVS which I have not found useful in most complex patients, especially with respiratory illnesses  Please see AVS for specific instructions unique to this visit that I personally wrote and verbalized to the the pt in detail and then reviewed with pt  by my nurse highlighting any  changes in therapy recommended at today's visit to their plan of care.

## 2017-11-20 ENCOUNTER — Ambulatory Visit (INDEPENDENT_AMBULATORY_CARE_PROVIDER_SITE_OTHER): Payer: Medicare Other

## 2017-11-20 DIAGNOSIS — Z23 Encounter for immunization: Secondary | ICD-10-CM

## 2017-11-20 MED ORDER — SERTRALINE HCL 50 MG PO TABS: 50.0000 mg | ORAL_TABLET | Freq: Every day | ORAL | 2 refills | Status: DC

## 2017-12-08 ENCOUNTER — Telehealth: Payer: Self-pay | Admitting: Internal Medicine

## 2017-12-08 NOTE — Telephone Encounter (Signed)
Prefilled Syringe: #150mg  1  #75mg  0 Ordered Date: 12/08/17 Shipping Date: 12/08/17

## 2017-12-09 NOTE — Telephone Encounter (Signed)
Prefilled Syringes: # 150mg  1  #75mg  0 Arrival Date: 12/09/17 Lot #: 150mg  [id number]      75mg  0 Exp Date: 150mg  05/2018   75mg  0

## 2017-12-10 ENCOUNTER — Other Ambulatory Visit: Payer: Self-pay | Admitting: *Deleted

## 2017-12-10 DIAGNOSIS — E039 Hypothyroidism, unspecified: Secondary | ICD-10-CM

## 2017-12-10 MED ORDER — LEVOTHYROXINE SODIUM 75 MCG PO TABS: ORAL_TABLET | ORAL | 3 refills | Status: DC

## 2017-12-29 NOTE — Progress Notes (Deleted)
Patient ID: Kathy Whitaker, female   DOB: November 10, 1933, 84 y.o.   MRN: [id number]  MEDICARE ANNUAL WELLNESS VISIT AND FOLLOW UP  Assessment:   Medicare annual wellness visit, subsequent 1 year  Essential hypertension - continue medications, DASH diet, exercise and monitor at home. Call if greater than 130/80.   Hyperlipidemia, mixed -continue medications, check lipids, decrease fatty foods, increase activity.   Medication management  Hypothyroidism, unspecified type Hypothyroidism-check TSH level, continue medications the same, reminded to take on an empty stomach 30-43mins before food.  -     levothyroxine (SYNTHROID, LEVOTHROID) 75 MCG tablet; TAKE 1 TABLET DAILY  Hyperlipidemia, unspecified hyperlipidemia type -continue medications, check lipids, decrease fatty foods, increase activity.   Depression, major, recurrent, in partial remission ([place]) In remission  Moderate persistent extrinsic asthma without complication Continue follow up Dr. [name]  Gastroesophageal reflux disease, esophagitis presence not specified Controlled  Hearing loss, unspecified hearing loss type, unspecified laterality Continue to monitor  Vitamin D deficiency Continue supplement  Vertigo/hearing loss Following Dr. Krista Blue MRI was unremarkable    Future Appointments  Date Time Provider [place]  12/30/2017  2:00 PM Liane Comber, NP [facility] None  04/10/2018 10:00 AM [name], MD [facility] None     Plan:   During the course of the visit the patient was educated and counseled about appropriate screening and preventive services including:    Pneumococcal vaccine   Influenza vaccine  Td vaccine  Screening electrocardiogram  Bone densitometry screening  Colorectal cancer screening  Diabetes screening  Glaucoma screening  Nutrition counseling   Advanced directives: requested   Subjective:   Kathy Whitaker is a 84 y.o. [place] who presents for Medicare Annual Wellness  Visit and 3 month follow up for chronic care management.    Patient has history of Asthma/allergies, follows with Dr. [name]. She is on pulmicort/performist once a day, she has been using the combivent once a day, and claritin and xolair injections that have helped significantly. Following with neurologist, Dr. Krista Blue, for vertigo. MRI on 12/14/2016 was unremarkable excepting age-appropriate microvascular changes.   BMI is There is no height or weight on file to calculate BMI., she {HAS HAS ION:62952} been working on diet and exercise. Wt Readings from Last 3 Encounters:  11/18/17 127 lb 3.2 oz (57.7 kg)  09/23/17 130 lb 9.6 oz (59.2 kg)  03/27/17 132 lb 6.4 oz (60.1 kg)   She has had elevated blood pressure since 2007 and has been monitored expectantly. [name] Her blood pressure has been controlled at home, & today their BP is   She does not workout. She denies chest pain.    She is on cholesterol medication (simvastatin) and denies myalgias. Her cholesterol is at goal. The cholesterol last visit was:   Lab Results  Component Value Date   CHOL 174 09/23/2017   HDL 77 09/23/2017   LDLCALC 76 09/23/2017   TRIG 127 09/23/2017   CHOLHDL 2.3 09/23/2017   She has had prediabetes in the past (2011 with A1c 6.0%). She has been working on diet and exercise for glucose management, and denies foot ulcerations, hyperglycemia, paresthesia of the feet, polydipsia, polyuria and visual disturbances. Last A1C in the office was:  Lab Results  Component Value Date   HGBA1C 5.5 09/23/2017    Lab Results  Component Value Date   GFRNONAA 62 09/23/2017   Patient is on Vitamin D supplement.   Lab Results  Component Value Date   VD25OH 68 09/23/2017     She is  on thyroid medication. Her medication was not changed last visit.   Lab Results  Component Value Date   TSH 1.10 09/23/2017      Medication Review: Current Outpatient Medications on File Prior to Visit  Medication Sig  . budesonide (PULMICORT)  0.25 MG/2ML nebulizer solution Take 2 mLs (0.25 mg total) by nebulization daily. Once daily and will take again if needed  . Cholecalciferol (VITAMIN D PO) Take 2,000 Units by mouth.  . EPINEPHrine (EPIPEN JR) 0.15 MG/0.3ML injection Inject 0.15 mg into the muscle as needed.    . formoterol (PERFOROMIST) 20 MCG/2ML nebulizer solution Take 2 mLs (20 mcg total) by nebulization 2 (two) times daily.  . Ipratropium-Albuterol (COMBIVENT RESPIMAT) 20-100 MCG/ACT AERS respimat USE 1 INHALATION EVERY 6 HOURS AS NEEDED FOR WHEEZING  . levothyroxine (SYNTHROID, LEVOTHROID) 75 MCG tablet TAKE 1 TABLET DAILY  . loratadine (CLARITIN) 10 MG tablet Take 10 mg by mouth daily.    . meclizine (ANTIVERT) 25 MG tablet Take 1/2 to 1 tablet 3 x /day as needed for dizziness / Vertigo  . sertraline (ZOLOFT) 50 MG tablet Take 1 tablet (50 mg total) by mouth daily.  . simvastatin (ZOCOR) 40 MG tablet Take 1 tablet (40 mg total) by mouth at bedtime.   Current Facility-Administered Medications on File Prior to Visit  Medication  . omalizumab Arvid Right) injection 150 mg  . omalizumab Arvid Right) injection 150 mg    Current Problems (verified) Patient Active Problem List   Diagnosis Date Noted  . Depression, major, recurrent, in partial remission ([place]) 12/09/2016  . Vertigo 12/03/2016  . Hearing loss 12/03/2016  . Encounter for Medicare annual wellness exam 11/29/2014  . Essential hypertension 05/21/2013  . Medication management 05/21/2013  . Other abnormal glucose   . Vitamin D deficiency   . Hypothyroidism   . Hyperlipemia   . Extrinsic asthma 05/07/2007  . GERD 11/12/2006   Screening Tests Immunization History  Administered Date(s) Administered  . Influenza Whole 10/05/2009, 11/26/2010, 11/05/2011  . Influenza, High Dose Seasonal PF 11/20/2012, 11/22/2013, 11/21/2014, 11/10/2015, 11/19/2016, 11/20/2017  . Pneumococcal Conjugate-13 11/22/2013  . Pneumococcal Polysaccharide-23 09/14/2008  . Td 10/09/2004,  03/12/2017  . Zoster 02/15/2013   Preventative care: Last colonoscopy: 10/2005, will not have another and declines cologuard  MGM 06/2016 following with Dr. Radene Knee, will request record DEXA 06/2014 PAP 2015 CT chest 2005 CXR 2014 Stress test 2013  Prior vaccinations: TD : 03/2017 Influenza: HD 11/2017  Pneumococcal: 09/14/2008  Prevnar: 11/22/2013 Shingles/Zostavax: 12/2013  Names of Other Physician/Practitioners you currently use: 1. [facility] here for primary care 2. Dr Thom Chimes (OD), eye doctor, last visit Oct 2018 3. Dr Ignatius Specking, dentist, last visit 2017  Patient Care Team: [name], MD as PCP - General (Internal Medicine) Elsie Stain, MD (Pulmonary Disease) Arvella Nigh, MD as Consulting Physician (Obstetrics and Gynecology)   Allergies Allergies  Allergen Reactions  . Codeine Nausea Only  . Propoxyphene N-Acetaminophen Nausea Only  . Shellfish-Derived Products Rash    SURGICAL HISTORY She  has a past surgical history that includes Tubal ligation (Bilateral, 1970); Tonsillectomy; Wisdom tooth extraction; and Dilatation & currettage/hysteroscopy with resectoscope (N/A, 09/10/2013). FAMILY HISTORY Her family history includes Cancer in her brother; Gout in her brother; Hodgkin's lymphoma in her brother; Other in her father; Thyroid disease in her mother. SOCIAL HISTORY She  reports that she has never smoked. She has never used smokeless tobacco. She reports that she drinks alcohol. She reports that she does not  use drugs.  MEDICARE WELLNESS OBJECTIVES: Physical activity:   Cardiac risk factors:   Depression/mood screen:   Depression screen [facility] 2/9 09/28/2017  Decreased Interest 0  Down, Depressed, Hopeless 0  PHQ - 2 Score 0    ADLs:  In your present state of health, do you have any difficulty performing the following activities: 09/28/2017 03/16/2017  Hearing? N N  Vision? N N  Difficulty concentrating or making  decisions? N N  Walking or climbing stairs? N N  Dressing or bathing? N N  Doing errands, shopping? N N  Some recent data might be hidden     Cognitive Testing  Alert? Yes  Normal Appearance?Yes  Oriented to person? Yes  Place? Yes   Time? Yes  Recall of three objects?  Yes  Can perform simple calculations? Yes  Displays appropriate judgment?Yes  Can read the correct time from a watch face?Yes  EOL planning:        Objective:     There were no vitals taken for this visit. There is no height or weight on file to calculate BMI.  General appearance: alert, no distress, WD/WN, female HEENT: normocephalic, sclerae anicteric, TMs pearly, nares patent, no discharge or erythema, pharynx normal Oral cavity: MMM, no lesions Neck: supple, no lymphadenopathy, no thyromegaly, no masses Heart: RRR, normal S1, S2, no murmurs Lungs: CTA bilaterally, course breath sounds, no wheezes, rhonchi, or rales Abdomen: +bs, soft, non tender, non distended, no masses, no hepatomegaly, no splenomegaly Musculoskeletal: nontender, no swelling, no obvious deformity Extremities: no edema, no cyanosis, no clubbing Pulses: 2+ symmetric, upper and lower extremities, normal cap refill Neurological: alert, oriented x 3, CN2-12 intact, strength normal upper extremities and lower extremities, sensation normal throughout, DTRs 2+ throughout, no cerebellar signs, gait normal Skin left brown macule on left arm and seb derm/actinic keratosis, wants removal Psychiatric: normal affect, behavior normal, pleasant   Medicare Attestation I have personally reviewed: The patient's medical and social history Their use of alcohol, tobacco or illicit drugs Their current medications and supplements The patient's functional ability including ADLs,fall risks, home safety risks, cognitive, and hearing and visual impairment Diet and physical activities Evidence for depression or mood disorders  The patient's weight, height,  BMI, and visual acuity have been recorded in the chart.  I have made referrals, counseling, and provided education to the patient based on review of the above and I have provided the patient with a written personalized care plan for preventive services.    Izora Ribas, NP   12/29/2017

## 2017-12-30 ENCOUNTER — Ambulatory Visit: Payer: Self-pay | Admitting: Adult Health

## 2018-01-02 ENCOUNTER — Ambulatory Visit (INDEPENDENT_AMBULATORY_CARE_PROVIDER_SITE_OTHER): Payer: Medicare Other

## 2018-01-02 DIAGNOSIS — J4540 Moderate persistent asthma, uncomplicated: Secondary | ICD-10-CM | POA: Diagnosis not present

## 2018-01-07 MED ORDER — OMALIZUMAB 150 MG ~~LOC~~ SOLR
150.0000 mg | SUBCUTANEOUS | Status: DC
  Administered 2018-01-02: 150 mg via SUBCUTANEOUS

## 2018-01-13 NOTE — Progress Notes (Signed)
Patient ID: Kathy Whitaker, female   DOB: February 28, 1933, 84 y.o.   MRN: [id number]  MEDICARE ANNUAL WELLNESS VISIT AND FOLLOW UP  Assessment:   Medicare annual wellness visit, subsequent 1 year  Essential hypertension - continue medications, DASH diet, exercise and monitor at home. Call if greater than 130/80.   Hyperlipidemia, mixed Low risk individual, passt age 45, discussed trial off of statin medication -, check lipids, decrease fatty foods, increase activity.   Medication management  Hypothyroidism, unspecified type Hypothyroidism-check TSH level, continue medications the same, reminded to take on an empty stomach 30-37mins before food.  -     levothyroxine (SYNTHROID, LEVOTHROID) 75 MCG tablet; TAKE 1 TABLET DAILY  Depression, major, recurrent, in partial remission ([place]) In remission on med Lifestyle discussed: diet/exerise, sleep hygiene, stress management, hydration  Moderate persistent extrinsic asthma without complication Continue follow up Dr. [name]  Gastroesophageal reflux disease, esophagitis presence not specified Controlled  Hearing loss, unspecified hearing loss type, unspecified laterality Continue to monitor  Vitamin D deficiency Continue supplement  Vertigo/hearing loss Following Dr. Krista Blue MRI was unremarkable    Future Appointments  Date Time Provider [place]  02/03/2018 11:15 AM [facility] None  04/10/2018 10:00 AM [name], MD [facility] None     Plan:   During the course of the visit the patient was educated and counseled about appropriate screening and preventive services including:    Pneumococcal vaccine   Influenza vaccine  Td vaccine  Screening electrocardiogram  Bone densitometry screening  Colorectal cancer screening  Diabetes screening  Glaucoma screening  Nutrition counseling   Advanced directives: requested   Subjective:   Kathy Whitaker is a 84 y.o. [place] who presents for Medicare  Annual Wellness Visit and 3 month follow up for chronic care management.   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 On zoloft 50 mg daily for mood and feels symptoms are currently well controlled.    Patient has history of Asthma/allergies, follows with Dr. [name]. She is on pulmicort/performist once a day, she has been using the combivent once a day, and claritin and xolair injections that have helped significantly. Cold weather can be problematic but doing well recently.   Following with neurologist, Dr. Krista Blue, for vertigo. MRI on 12/14/2016 was unremarkable excepting age-appropriate microvascular changes. She reports symptoms are stable and managing.   BMI is Body mass index is 24.84 kg/m., she has been working on diet, exercise limited due to time limitations and asthma reactive to cold whether.  Wt Readings from Last 3 Encounters:  01/14/18 127 lb 3.2 oz (57.7 kg)  11/18/17 127 lb 3.2 oz (57.7 kg)  09/23/17 130 lb 9.6 oz (59.2 kg)   She has had elevated blood pressure since 2007 and has been monitored expectantly. [name] Her blood pressure has been controlled at home, & today their BP is BP: 110/70 She does not workout. She denies chest pain.    She is on cholesterol medication (simvastatin 40 mg daily) and denies myalgias. Her cholesterol is at goal. The cholesterol last visit was:   Lab Results  Component Value Date   CHOL 174 09/23/2017   HDL 77 09/23/2017   LDLCALC 76 09/23/2017   TRIG 127 09/23/2017   CHOLHDL 2.3 09/23/2017   She has had prediabetes in the past (2011 with A1c 6.0%). She has been working on diet and exercise for glucose management, and denies foot ulcerations, hyperglycemia, paresthesia of the feet, polydipsia, polyuria and visual disturbances. Last A1C in the office  was:  Lab Results  Component Value Date   HGBA1C 5.5 09/23/2017   Lab Results  Component Value Date   GFRNONAA 62 09/23/2017   Patient is on Vitamin D supplement.   Lab Results  Component  Value Date   VD25OH 68 09/23/2017     She is on thyroid medication. Her medication was not changed last visit.   Lab Results  Component Value Date   TSH 1.10 09/23/2017     Medication Review: Current Outpatient Medications on File Prior to Visit  Medication Sig  . budesonide (PULMICORT) 0.25 MG/2ML nebulizer solution Take 2 mLs (0.25 mg total) by nebulization daily. Once daily and will take again if needed  . Cholecalciferol (VITAMIN D PO) Take 2,000 Units by mouth.  . EPINEPHrine (EPIPEN JR) 0.15 MG/0.3ML injection Inject 0.15 mg into the muscle as needed.    . formoterol (PERFOROMIST) 20 MCG/2ML nebulizer solution Take 2 mLs (20 mcg total) by nebulization 2 (two) times daily.  . Ipratropium-Albuterol (COMBIVENT RESPIMAT) 20-100 MCG/ACT AERS respimat USE 1 INHALATION EVERY 6 HOURS AS NEEDED FOR WHEEZING  . levothyroxine (SYNTHROID, LEVOTHROID) 75 MCG tablet TAKE 1 TABLET DAILY  . loratadine (CLARITIN) 10 MG tablet Take 10 mg by mouth daily.    . meclizine (ANTIVERT) 25 MG tablet Take 1/2 to 1 tablet 3 x /day as needed for dizziness / Vertigo  . sertraline (ZOLOFT) 50 MG tablet Take 1 tablet (50 mg total) by mouth daily.   Current Facility-Administered Medications on File Prior to Visit  Medication  . omalizumab Arvid Right) injection 150 mg    Current Problems (verified) Patient Active Problem List   Diagnosis Date Noted  . Depression, major, recurrent, in partial remission ([place]) 12/09/2016  . Vertigo 12/03/2016  . Hearing loss 12/03/2016  . BMI 24.0-24.9, adult 11/29/2014  . Encounter for Medicare annual wellness exam 11/29/2014  . Essential hypertension 05/21/2013  . Medication management 05/21/2013  . Other abnormal glucose   . Vitamin D deficiency   . Hypothyroidism   . Hyperlipemia   . Extrinsic asthma 05/07/2007  . GERD 11/12/2006   Screening Tests Immunization History  Administered Date(s) Administered  . Influenza Whole 10/05/2009, 11/26/2010, 11/05/2011  .  Influenza, High Dose Seasonal PF 11/20/2012, 11/22/2013, 11/21/2014, 11/10/2015, 11/19/2016, 11/20/2017  . Pneumococcal Conjugate-13 11/22/2013  . Pneumococcal Polysaccharide-23 09/14/2008  . Td 10/09/2004, 03/12/2017  . Zoster 02/15/2013   Preventative care: Last colonoscopy: 10/2005, will not have another and declines cologuard  MGM 12/2016 following with Dr. Radene Knee DEXA 06/2014 has via GYN - she had 05/2017 via [facility] as part of study - reports was unchanged from previous - mild osteopenia- will send report PAP 2015 CT chest 2005 CXR 2014 Stress test 2013  Prior vaccinations: TD : 03/2017 Influenza: HD 11/2017  Pneumococcal: 09/14/2008  Prevnar: 11/22/2013 Shingles/Zostavax: 12/2013  Names of Other Physician/Practitioners you currently use: 1. [facility] here for primary care 2. Dr Thom Chimes (OD), eye doctor, last visit Oct 2018, has scheduled with Dr. Katy Fitch next year 3. Dr Johnnye Sima, dentist, last visit 2019, has implants  Patient Care Team: [name], MD as PCP - General (Internal Medicine) Elsie Stain, MD (Pulmonary Disease) Arvella Nigh, MD as Consulting Physician (Obstetrics and Gynecology)   Allergies Allergies  Allergen Reactions  . Codeine Nausea Only  . Propoxyphene N-Acetaminophen Nausea Only  . Shellfish-Derived Products Rash    SURGICAL HISTORY She  has a past surgical history that includes Tubal ligation (Bilateral, 1970); Tonsillectomy;  Wisdom tooth extraction; and Dilatation & currettage/hysteroscopy with resectoscope (N/A, 09/10/2013). FAMILY HISTORY Her family history includes Cancer in her brother; Gout in her brother; Hodgkin's lymphoma in her brother; Other in her father; Thyroid disease in her mother. SOCIAL HISTORY She  reports that she has never smoked. She has never used smokeless tobacco. She reports that she drinks alcohol. She reports that she does not use drugs.  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 N N  Vision? N N  Difficulty concentrating or making decisions? N N  Walking or climbing stairs? N N  Dressing or bathing? N N  Doing errands, shopping? N N  Some recent data might be hidden     Cognitive Testing  Alert? Yes  Normal Appearance?Yes  Oriented to person? Yes  Place? Yes   Time? Yes  Recall of three objects?  Yes  Can perform simple calculations? Yes  Displays appropriate judgment?Yes  Can read the correct time from a watch face?Yes  EOL planning: Does Patient Have a Medical Advance Directive?: Yes Type of Advance Directive: Healthcare Power of Attorney, Living will Does patient want to make changes to medical advance directive?: No - Patient declined Copy of [place] in Chart?: Yes - validated most recent copy scanned in chart (See row information)      Objective:     Blood pressure 110/70, pulse 61, temperature (!) 97.3 F (36.3 C), height 5' (1.524 m), weight 127 lb 3.2 oz (57.7 kg), SpO2 92 %. Body mass index is 24.84 kg/m.  General appearance: alert, no distress, WD/WN, female HEENT: normocephalic, sclerae anicteric, TMs pearly, nares patent, no discharge or erythema, pharynx normal Oral cavity: MMM, no lesions Neck: supple, no lymphadenopathy, no thyromegaly, no masses Heart: RRR, normal S1, S2, no murmurs Lungs:  CTA bilaterally, course breath sounds, no wheezes, rhonchi, or rales Abdomen: +bs, soft, non tender, non distended, no masses, no hepatomegaly, no splenomegaly Musculoskeletal: nontender, no swelling, no obvious deformity Extremities: no edema, no cyanosis, no clubbing Pulses: 2+ symmetric, upper and lower extremities, normal cap refill Neurological: alert, oriented x 3, CN2-12 intact, strength normal upper extremities and lower extremities, sensation normal throughout, DTRs 2+ throughout, no cerebellar signs, gait normal Skin left brown macule on left arm and seb derm/actinic keratosis, wants removal Psychiatric: normal affect, behavior normal, pleasant   Medicare Attestation I have personally reviewed: The patient's medical and social history Their use of alcohol, tobacco or illicit drugs Their current medications and supplements The patient's functional ability including ADLs,fall risks, home safety risks, cognitive, and hearing and visual impairment Diet and physical activities Evidence for depression or mood disorders  The patient's weight, height, BMI, and visual acuity have been recorded in the chart.  I have made referrals, counseling, and provided education to the patient based on review of the above and I have provided the patient with a written personalized care plan for preventive services.    Izora Ribas, NP  01/14/2018  

## 2018-01-14 ENCOUNTER — Encounter: Payer: Self-pay | Admitting: Adult Health

## 2018-01-14 ENCOUNTER — Ambulatory Visit (INDEPENDENT_AMBULATORY_CARE_PROVIDER_SITE_OTHER): Payer: Medicare Other | Admitting: Adult Health

## 2018-01-14 VITALS — BP 110/70 | HR 61 | Temp 97.3°F | Ht 60.0 in | Wt 127.2 lb

## 2018-01-14 DIAGNOSIS — Z79899 Other long term (current) drug therapy: Secondary | ICD-10-CM

## 2018-01-14 DIAGNOSIS — E785 Hyperlipidemia, unspecified: Secondary | ICD-10-CM | POA: Diagnosis not present

## 2018-01-14 DIAGNOSIS — J4540 Moderate persistent asthma, uncomplicated: Secondary | ICD-10-CM

## 2018-01-14 DIAGNOSIS — H9190 Unspecified hearing loss, unspecified ear: Secondary | ICD-10-CM | POA: Diagnosis not present

## 2018-01-14 DIAGNOSIS — R42 Dizziness and giddiness: Secondary | ICD-10-CM

## 2018-01-14 DIAGNOSIS — K219 Gastro-esophageal reflux disease without esophagitis: Secondary | ICD-10-CM

## 2018-01-14 DIAGNOSIS — F3341 Major depressive disorder, recurrent, in partial remission: Secondary | ICD-10-CM

## 2018-01-14 DIAGNOSIS — Z0001 Encounter for general adult medical examination with abnormal findings: Secondary | ICD-10-CM

## 2018-01-14 DIAGNOSIS — R6889 Other general symptoms and signs: Secondary | ICD-10-CM

## 2018-01-14 DIAGNOSIS — E039 Hypothyroidism, unspecified: Secondary | ICD-10-CM

## 2018-01-14 DIAGNOSIS — R7309 Other abnormal glucose: Secondary | ICD-10-CM

## 2018-01-14 DIAGNOSIS — E559 Vitamin D deficiency, unspecified: Secondary | ICD-10-CM

## 2018-01-14 DIAGNOSIS — Z6824 Body mass index (BMI) 24.0-24.9, adult: Secondary | ICD-10-CM | POA: Diagnosis not present

## 2018-01-14 DIAGNOSIS — I10 Essential (primary) hypertension: Secondary | ICD-10-CM | POA: Diagnosis not present

## 2018-01-14 DIAGNOSIS — Z0000 Encounter for general adult medical examination without abnormal findings: Secondary | ICD-10-CM

## 2018-01-14 NOTE — Patient Instructions (Addendum)
Kathy Whitaker , Thank you for taking time to come for your Medicare Wellness Visit. I appreciate your ongoing commitment to your health goals. Please review the following plan we discussed and let me know if I can assist you in the future.   This is a list of the screening recommended for you and due dates:  Health Maintenance  Topic Date Due  . Tetanus Vaccine  03/13/2027  . Flu Shot  Completed  . DEXA scan (bone density measurement)  Completed  . Pneumonia vaccines  Completed    Know what a healthy weight is for you (roughly BMI <25) and aim to maintain this  Aim for 7+ servings of fruits and vegetables daily  65-80+ fluid ounces of water or unsweet tea for healthy kidneys  Limit to max 1 drink of alcohol per day; avoid smoking/tobacco  Limit animal fats in diet for cholesterol and heart health - choose grass fed whenever available  Avoid highly processed foods, and foods high in saturated/trans fats  Aim for low stress - take time to unwind and care for your mental health  Aim for 150 min of moderate intensity exercise weekly for heart health, and weights twice weekly for bone health  Aim for 7-9 hours of sleep daily      Preventing High Cholesterol Cholesterol is a waxy, fat-like substance that your body needs in small amounts. Your liver makes all the cholesterol that your body needs. Having high cholesterol (hypercholesterolemia) increases your risk for heart disease and stroke. Extra (excess) cholesterol comes from the food you eat, such as animal-based fat (saturated fat) from meat and some dairy products. High cholesterol can often be prevented with diet and lifestyle changes. If you already have high cholesterol, you can control it with diet and lifestyle changes, as well as medicine. What nutrition changes can be made?  Eat less saturated fat. Foods that contain saturated fat include red meat and some dairy products.  Avoid processed meats, like bacon and lunch  meats.  Avoid trans fats, which are found in margarine and some baked goods.  Avoid foods and beverages that have added sugars.  Eat more fruits, vegetables, and whole grains.  Choose healthy sources of protein, such as fish, poultry, and nuts.  Choose healthy sources of fat, such as: ? Nuts. ? Vegetable oils, especially olive oil. ? Fish that have healthy fats (omega-3 fatty acids), such as mackerel or salmon. What lifestyle changes can be made?  Lose weight if you are overweight. Losing 5-10 lb (2.3-4.5 kg) can help prevent or control high cholesterol and reduce your risk for diabetes and high blood pressure. Ask your health care provider to help you with a diet and exercise plan to safely lose weight.  Get enough exercise. Do at least 150 minutes of moderate-intensity exercise each week. ? You could do this in short exercise sessions several times a day, or you could do longer exercise sessions a few times a week. For example, you could take a brisk 10-minute walk or bike ride, 3 times a day, for 5 days a week.  Do not smoke. If you need help quitting, ask your health care provider.  Limit your alcohol intake. If you drink alcohol, limit alcohol intake to no more than 1 drink a day for nonpregnant women and 2 drinks a day for men. One drink equals 12 oz of beer, 5 oz of wine, or 1 oz of hard liquor. Why are these changes important? If you have high cholesterol,  deposits (plaques) may build up on the walls of your blood vessels. Plaques make the arteries narrower and stiffer, which can restrict or block blood flow and cause blood clots to form. This greatly increases your risk for heart attack and stroke. Making diet and lifestyle changes can reduce your risk for these life-threatening conditions. What can I do to lower my risk?  Manage your risk factors for high cholesterol. Talk with your health care provider about all of your risk factors and how to lower your risk.  Manage other  conditions that you have, such as diabetes or high blood pressure (hypertension).  Have your cholesterol checked at regular intervals.  Keep all follow-up visits as told by your health care provider. This is important. How is this treated? In addition to diet and lifestyle changes, your health care provider may recommend medicines to help lower cholesterol, such as a medicine to reduce the amount of cholesterol made in your liver. You may need medicine if:  Diet and lifestyle changes do not lower your cholesterol enough.  You have high cholesterol and other risk factors for heart disease or stroke.  Take over-the-counter and prescription medicines only as told by your health care provider. Where to find more information:  American Heart Association: ThisTune.com.pt.jsp  National Heart, Lung, and Blood Institute: FrenchToiletries.com.cy Summary  High cholesterol increases your risk for heart disease and stroke. By keeping your cholesterol level low, you can reduce your risk for these conditions.  Diet and lifestyle changes are the most important steps in preventing high cholesterol.  Work with your health care provider to manage your risk factors, and have your blood tested regularly. This information is not intended to replace advice given to you by your health care provider. Make sure you discuss any questions you have with your health care provider. Document Released: 02/05/2015 Document Revised: 09/30/2015 Document Reviewed: 09/30/2015 Elsevier Interactive Patient Education  [employer].

## 2018-01-15 LAB — CBC WITH DIFFERENTIAL/PLATELET
BASOS PCT: 1 %
Basophils Absolute: 59 cells/uL (ref 0–200)
EOS PCT: 2.4 %
Eosinophils Absolute: 142 cells/uL (ref 15–500)
HEMATOCRIT: 37.5 % (ref 35.0–45.0)
Hemoglobin: 12.7 g/dL (ref 11.7–15.5)
Lymphs Abs: 1322 cells/uL (ref 850–3900)
MCH: 30 pg (ref 27.0–33.0)
MCHC: 33.9 g/dL (ref 32.0–36.0)
MCV: 88.4 fL (ref 80.0–100.0)
MPV: 11.1 fL (ref 7.5–12.5)
Monocytes Relative: 10.2 %
Neutro Abs: 3776 cells/uL (ref 1500–7800)
Neutrophils Relative %: 64 %
PLATELETS: 258 10*3/uL (ref 140–400)
RBC: 4.24 10*6/uL (ref 3.80–5.10)
RDW: 13.2 % (ref 11.0–15.0)
Total Lymphocyte: 22.4 %
WBC mixed population: 602 cells/uL (ref 200–950)
WBC: 5.9 10*3/uL (ref 3.8–10.8)

## 2018-01-15 LAB — LIPID PANEL
CHOLESTEROL: 180 mg/dL (ref <200)
HDL: 64 mg/dL (ref >50)
LDL CHOLESTEROL (CALC): 95 mg/dL
Non-HDL Cholesterol (Calc): 116 mg/dL (calc) (ref <130)
Total CHOL/HDL Ratio: 2.8 (calc) (ref <5.0)
Triglycerides: 113 mg/dL (ref <150)

## 2018-01-15 LAB — COMPLETE METABOLIC PANEL WITH GFR
AG RATIO: 1.4 (calc) (ref 1.0–2.5)
ALKALINE PHOSPHATASE (APISO): 78 U/L (ref 33–130)
ALT: 9 U/L (ref 6–29)
AST: 18 U/L (ref 10–35)
Albumin: 4.1 g/dL (ref 3.6–5.1)
BILIRUBIN TOTAL: 0.4 mg/dL (ref 0.2–1.2)
BUN/Creatinine Ratio: 23 (calc) — ABNORMAL HIGH (ref 6–22)
BUN: 22 mg/dL (ref 7–25)
CHLORIDE: 104 mmol/L (ref 98–110)
CO2: 29 mmol/L (ref 20–32)
CREATININE: 0.96 mg/dL — AB (ref 0.60–0.88)
Calcium: 9.8 mg/dL (ref 8.6–10.4)
GFR, EST NON AFRICAN AMERICAN: 55 mL/min/{1.73_m2} — AB (ref >=60)
GFR, Est African American: 64 mL/min/{1.73_m2} (ref >=60)
Globulin: 2.9 g/dL (calc) (ref 1.9–3.7)
Glucose, Bld: 82 mg/dL (ref 65–99)
Potassium: 4.7 mmol/L (ref 3.5–5.3)
Sodium: 141 mmol/L (ref 135–146)
Total Protein: 7 g/dL (ref 6.1–8.1)

## 2018-01-15 LAB — MAGNESIUM: MAGNESIUM: 2 mg/dL (ref 1.5–2.5)

## 2018-01-15 LAB — TSH: TSH: 1.38 m[IU]/L (ref 0.40–4.50)

## 2018-02-03 ENCOUNTER — Telehealth: Payer: Self-pay | Admitting: Internal Medicine

## 2018-02-03 ENCOUNTER — Ambulatory Visit: Payer: Medicare Other

## 2018-02-03 NOTE — Telephone Encounter (Signed)
Prefilled Syringes: # 150mg  1  #75mg  0 Arrival Date: 02/03/18 Lot #: 150mg  [id number]      75mg  0 Exp Date: 150mg  07/2018   75mg  0

## 2018-02-17 ENCOUNTER — Ambulatory Visit (INDEPENDENT_AMBULATORY_CARE_PROVIDER_SITE_OTHER): Payer: Medicare Other

## 2018-02-17 DIAGNOSIS — J4540 Moderate persistent asthma, uncomplicated: Secondary | ICD-10-CM

## 2018-02-19 MED ORDER — OMALIZUMAB 150 MG ~~LOC~~ SOLR
150.0000 mg | Freq: Once | SUBCUTANEOUS | Status: AC
  Administered 2018-02-17: 150 mg via SUBCUTANEOUS

## 2018-03-17 ENCOUNTER — Other Ambulatory Visit: Payer: Self-pay

## 2018-03-17 DIAGNOSIS — E039 Hypothyroidism, unspecified: Secondary | ICD-10-CM

## 2018-03-17 MED ORDER — LEVOTHYROXINE SODIUM 75 MCG PO TABS: ORAL_TABLET | ORAL | 0 refills | Status: DC

## 2018-03-17 NOTE — Telephone Encounter (Signed)
Refill request for Levothyroxine. Patient is going out of town on Friday and won't have time to wait on mail order. Please send to local pharmacy

## 2018-03-19 ENCOUNTER — Telehealth: Payer: Self-pay | Admitting: Internal Medicine

## 2018-03-19 ENCOUNTER — Ambulatory Visit (INDEPENDENT_AMBULATORY_CARE_PROVIDER_SITE_OTHER): Payer: Medicare Other

## 2018-03-19 DIAGNOSIS — J4540 Moderate persistent asthma, uncomplicated: Secondary | ICD-10-CM | POA: Diagnosis not present

## 2018-03-19 NOTE — Telephone Encounter (Signed)
Prefilled Syringes: # 150mg  1  #75mg  0 Arrival Date: 03/19/2018 Lot #: 150mg  [id number]      75mg  0 Exp Date: 150mg  01/2019   75mg  0

## 2018-03-20 ENCOUNTER — Ambulatory Visit: Payer: Medicare Other

## 2018-03-24 MED ORDER — OMALIZUMAB 150 MG ~~LOC~~ SOLR
150.0000 mg | SUBCUTANEOUS | Status: AC
  Administered 2018-03-19: 150 mg via SUBCUTANEOUS

## 2018-03-24 NOTE — Progress Notes (Signed)
Xolair injection documentation and charges entered by Cleora Karnik, RMA, based on injection sheet filled out by Tammy Scott during preparation and administration. This documentation process is due to office requirements.   

## 2018-04-10 ENCOUNTER — Encounter: Payer: Self-pay | Admitting: Internal Medicine

## 2018-04-14 ENCOUNTER — Other Ambulatory Visit: Payer: Self-pay

## 2018-04-14 DIAGNOSIS — E039 Hypothyroidism, unspecified: Secondary | ICD-10-CM

## 2018-04-14 MED ORDER — LEVOTHYROXINE SODIUM 75 MCG PO TABS: ORAL_TABLET | ORAL | 0 refills | Status: DC

## 2018-04-22 ENCOUNTER — Other Ambulatory Visit: Payer: Self-pay

## 2018-04-22 ENCOUNTER — Encounter: Payer: Self-pay | Admitting: Internal Medicine

## 2018-04-22 ENCOUNTER — Ambulatory Visit (INDEPENDENT_AMBULATORY_CARE_PROVIDER_SITE_OTHER): Payer: Medicare Other | Admitting: Internal Medicine

## 2018-04-22 VITALS — BP 126/80 | HR 56 | Temp 97.9°F | Resp 16 | Ht 59.5 in | Wt 128.6 lb

## 2018-04-22 DIAGNOSIS — R7309 Other abnormal glucose: Secondary | ICD-10-CM

## 2018-04-22 DIAGNOSIS — I10 Essential (primary) hypertension: Secondary | ICD-10-CM | POA: Diagnosis not present

## 2018-04-22 DIAGNOSIS — E559 Vitamin D deficiency, unspecified: Secondary | ICD-10-CM | POA: Diagnosis not present

## 2018-04-22 DIAGNOSIS — E782 Mixed hyperlipidemia: Secondary | ICD-10-CM

## 2018-04-22 DIAGNOSIS — Z1211 Encounter for screening for malignant neoplasm of colon: Secondary | ICD-10-CM

## 2018-04-22 DIAGNOSIS — Z79899 Other long term (current) drug therapy: Secondary | ICD-10-CM | POA: Diagnosis not present

## 2018-04-22 DIAGNOSIS — K219 Gastro-esophageal reflux disease without esophagitis: Secondary | ICD-10-CM

## 2018-04-22 DIAGNOSIS — E039 Hypothyroidism, unspecified: Secondary | ICD-10-CM | POA: Diagnosis not present

## 2018-04-22 DIAGNOSIS — Z136 Encounter for screening for cardiovascular disorders: Secondary | ICD-10-CM | POA: Diagnosis not present

## 2018-04-22 DIAGNOSIS — Z1212 Encounter for screening for malignant neoplasm of rectum: Secondary | ICD-10-CM

## 2018-04-22 NOTE — Progress Notes (Signed)
[facility] [name], M.D.     Uvaldo Bristle. Silverio Lay, P.A.-C [address], [place], [place] [address] Telephone [phone] Telefax [phone]  Comprehensive Evaluation &  Examination     This very nice 85 y.o. MWF presents for a comprehensive evaluation and management of multiple medical co-morbidities.  Patient has been followed for HTN, HLD, Prediabetes  and Vitamin D Deficiency. Patient is followed by [name] for Allergic Asthma & has been on Xolair injections since 2007.      HTN predates circa 2004.  Stress Myoview in 2013 was Negative. Patient's BP has been controlled at home and patient denies any cardiac symptoms as chest pain, palpitations, shortness of breath, dizziness or ankle swelling. Today's BP is at goal  - 126/80.      Patient's hyperlipidemia is controlled with diet and medications. Patient denies myalgias or other medication SE's. Last lipids were at goal:  Lab Results  Component Value Date   CHOL 286 (H) 04/22/2018   HDL 70 04/22/2018   LDLCALC 181 (H) 04/22/2018   TRIG 190 (H) 04/22/2018   CHOLHDL 4.1 04/22/2018      Patient has hx/o prediabetes (A1c 6.0% / 2011 and 5.7% / 2012)  and patient denies reactive hypoglycemic symptoms, visual blurring, diabetic polys or paresthesias. Last A1c was Normal & at goal: Lab Results  Component Value Date   HGBA1C 5.5 04/22/2018      Patient was initiated on Thyroid Replacement sin the 1980's     Finally, patient has history of Vitamin D Deficiency and last Vitamin D was slightly low (goal 70-100): Lab Results  Component Value Date   VD25OH 49 04/22/2018   Current Outpatient Medications on File Prior to Visit  Medication Sig  . budesonide  0.25 MG/2ML neb  soln Take 2 mLs (0.25 mg total) by nebulization daily  . VITAMIN D 2,000 Units Take  Daily  . EPINEPHrine  0.15 MG/0.3ML injec Inject 0.15 mg into the muscle as  needed.    . formoterol  20 MCG/2ML neb soln Take 2 mLs  by neb 2 times daily.  . COMBIVENT RESPIMAT  USE 1 INHALATION EVERY 6 HRS AS NEEDED   . levothyroxine  75 MCG  TAKE 1 TABLET DAILY  . loratadine  10 MG  Take  daily.    . meclizine  25 MG  Take 1/2 to 1 tablet 3 x /day as needed   . sertraline  50 MG  Take 1 tablet  daily.   Current Facility-Administered Medications on File Prior to Visit  Medication  . omalizumab Arvid Right) injection 150 mg   Allergies  Allergen Reactions  . Codeine Nausea Only  . Propoxyphene N-Acetaminophen Nausea Only  . Shellfish-Derived Products Rash   Past Medical History:  Diagnosis Date  . Asthma   . COPD (chronic obstructive pulmonary disease) ([place])   . Depression   . Dizziness   . Hyperlipemia   . Hypertension   . Hypothyroidism   . Prediabetes   . SVD (spontaneous vaginal delivery)    x 2  . Vitamin D deficiency    Health Maintenance  Topic Date Due  . TETANUS/TDAP  03/13/2027  . INFLUENZA VACCINE  Completed  . DEXA SCAN  Completed  . PNA vac Low Risk Adult  Completed   Immunization History  Administered Date(s) Administered  . Influenza Whole 10/05/2009, 11/26/2010, 11/05/2011  . Influenza, High Dose Seasonal PF 11/20/2012, 11/22/2013, 11/21/2014,  11/10/2015, 11/19/2016, 11/20/2017  . Pneumococcal Conjugate-13 11/22/2013  . Pneumococcal Polysaccharide-23 09/14/2008  . Td 10/09/2004, 03/12/2017  . Zoster 02/15/2013   Last Colon - 09/2005 - Dr Deborha Payment - No f/u due to age  MGM - 10/2017 - Dr Sherran Needs office Last MGM - 10/09/2016  Past Surgical History:  Procedure Laterality Date  . DILATATION & CURRETTAGE/HYSTEROSCOPY WITH RESECTOCOPE N/A 09/10/2013   Procedure: DILATATION & CURETTAGE/HYSTEROSCOPY WITH RESECTOCOPE;  Surgeon: Darlyn Chamber, MD;  Location: [facility];  Service: Gynecology;  Laterality: N/A;  . TONSILLECTOMY    . TUBAL LIGATION Bilateral 1970  . WISDOM TOOTH EXTRACTION     Family History  Problem Relation Age of  Onset  . Thyroid disease Mother   . Cancer Brother        Thyroid  . Gout Brother   . Hodgkin's lymphoma Brother   . Other Father        unsure of medical history   Social History   Tobacco Use  . Smoking status: Never Smoker  . Smokeless tobacco: Never Used  Substance Use Topics  . Alcohol use: Yes    Comment: Wine daily  . Drug use: No    ROS Constitutional: Denies fever, chills, weight loss/gain, headaches, insomnia,  night sweats, and change in appetite. Does c/o fatigue. Eyes: Denies redness, blurred vision, diplopia, discharge, itchy, watery eyes.  ENT: Denies discharge, congestion, post nasal drip, epistaxis, sore throat, earache, hearing loss, dental pain, Tinnitus, Vertigo, Sinus pain, snoring.  Cardio: Denies chest pain, palpitations, irregular heartbeat, syncope, dyspnea, diaphoresis, orthopnea, PND, claudication, edema Respiratory: denies cough, dyspnea, DOE, pleurisy, hoarseness, laryngitis, wheezing.  Gastrointestinal: Denies dysphagia, heartburn, reflux, water brash, pain, cramps, nausea, vomiting, bloating, diarrhea, constipation, hematemesis, melena, hematochezia, jaundice, hemorrhoids Genitourinary: Denies dysuria, frequency, urgency, nocturia, hesitancy, discharge, hematuria, flank pain Breast: Breast lumps, nipple discharge, bleeding.  Musculoskeletal: Denies arthralgia, myalgia, stiffness, Jt. Swelling, pain, limp, and strain/sprain. Denies falls. Skin: Denies puritis, rash, hives, warts, acne, eczema, changing in skin lesion Neuro: No weakness, tremor, incoordination, spasms, paresthesia, pain Psychiatric: Denies confusion, memory loss, sensory loss. Denies Depression. Endocrine: Denies change in weight, skin, hair change, nocturia, and paresthesia, diabetic polys, visual blurring, hyper / hypo glycemic episodes.  Heme/Lymph: No excessive bleeding, bruising, enlarged lymph nodes.  Physical Exam  BP 126/80   Pulse (!) 56   Temp 97.9 F (36.6 C)   Resp 16    Ht 4' 11.5" (1.511 m)   Wt 128 lb 9.6 oz (58.3 kg)   BMI 25.54 kg/m   General Appearance: Well nourished, well groomed and in no apparent distress.  Eyes: PERRLA, EOMs, conjunctiva no swelling or erythema, normal fundi and vessels. Sinuses: No frontal/maxillary tenderness ENT/Mouth: EACs patent / TMs  nl. Nares clear without erythema, swelling, mucoid exudates. Oral hygiene is good. No erythema, swelling, or exudate. Tongue normal, non-obstructing. Tonsils not swollen or erythematous. Hearing normal.  Neck: Supple, thyroid not palpable. No bruits, nodes or JVD. Respiratory: Respiratory effort normal.  BS equal and clear bilateral without rales, rhonci, wheezing or stridor. Cardio: Heart sounds are normal with regular rate and rhythm and no murmurs, rubs or gallops. Peripheral pulses are normal and equal bilaterally without edema. No aortic or femoral bruits. Chest: symmetric with normal excursions and percussion. Breasts: Symmetric, without lumps, nipple discharge, retractions, or fibrocystic changes.  Abdomen: Flat, soft with bowel sounds active. Nontender, no guarding, rebound, hernias, masses, or organomegaly.  Lymphatics: Non tender without lymphadenopathy.  Musculoskeletal: Full ROM all peripheral extremities,  joint stability, 5/5 strength, and normal gait. Skin: Warm and dry without rashes, lesions, cyanosis, clubbing or  ecchymosis.  Neuro: Cranial nerves intact, reflexes equal bilaterally. Normal muscle tone, no cerebellar symptoms. Sensation intact.  Pysch: Alert and oriented X 3, normal affect, Insight and Judgment appropriate.   Assessment and Plan  1. Essential hypertension  - EKG 12-Lead - Urinalysis, Routine w reflex microscopic - Microalbumin / creatinine urine ratio - CBC with Differential/Platelet - COMPLETE METABOLIC PANEL WITH GFR - Magnesium - TSH  2. Hyperlipidemia, mixed  - EKG 12-Lead - Lipid panel - TSH  3. Abnormal glucose  - EKG 12-Lead -  Hemoglobin A1c - Insulin, random  4. Vitamin D deficiency  - VITAMIN D 25 Hydroxyl  5. Hypothyroidism  - TSH  6. Gastroesophageal reflux disease  - CBC with Differential/Platelet  7. Screening for colorectal cancer  - POC Hemoccult Bld/Stl  8. Screening for ischemic heart disease  - EKG 12-Lead  9. Medication management  - Urinalysis, Routine w reflex microscopic - Microalbumin / creatinine urine ratio - CBC with Differential/Platelet - COMPLETE METABOLIC PANEL WITH GFR - Magnesium - Lipid panel - TSH - Hemoglobin A1c - Insulin, random - VITAMIN D 25 Hydroxyl        Patient was counseled in prudent diet to achieve/maintain BMI less than 25 for weight control, BP monitoring, regular exercise and medications. Discussed med's effects and SE's. Screening labs and tests as requested with regular follow-up as recommended. Over 40 minutes of exam, counseling, chart review and high complex critical decision making was performed.

## 2018-04-22 NOTE — Patient Instructions (Signed)

## 2018-04-23 ENCOUNTER — Telehealth: Payer: Self-pay | Admitting: Internal Medicine

## 2018-04-23 LAB — CBC WITH DIFFERENTIAL/PLATELET
Absolute Monocytes: 504 cells/uL (ref 200–950)
Basophils Absolute: 52 cells/uL (ref 0–200)
Basophils Relative: 1 %
Eosinophils Absolute: 130 cells/uL (ref 15–500)
Eosinophils Relative: 2.5 %
HCT: 39.2 % (ref 35.0–45.0)
Hemoglobin: 13 g/dL (ref 11.7–15.5)
Lymphs Abs: 1409 cells/uL (ref 850–3900)
MCH: 29.7 pg (ref 27.0–33.0)
MCHC: 33.2 g/dL (ref 32.0–36.0)
MCV: 89.7 fL (ref 80.0–100.0)
MPV: 11 fL (ref 7.5–12.5)
Monocytes Relative: 9.7 %
Neutro Abs: 3104 cells/uL (ref 1500–7800)
Neutrophils Relative %: 59.7 %
Platelets: 255 10*3/uL (ref 140–400)
RBC: 4.37 10*6/uL (ref 3.80–5.10)
RDW: 14 % (ref 11.0–15.0)
Total Lymphocyte: 27.1 %
WBC: 5.2 10*3/uL (ref 3.8–10.8)

## 2018-04-23 LAB — LIPID PANEL
CHOL/HDL RATIO: 4.1 (calc) (ref <5.0)
Cholesterol: 286 mg/dL — ABNORMAL HIGH (ref <200)
HDL: 70 mg/dL (ref >=50)
LDL CHOLESTEROL (CALC): 181 mg/dL — AB
Non-HDL Cholesterol (Calc): 216 mg/dL (calc) — ABNORMAL HIGH (ref <130)
Triglycerides: 190 mg/dL — ABNORMAL HIGH (ref <150)

## 2018-04-23 LAB — URINALYSIS, ROUTINE W REFLEX MICROSCOPIC
Bilirubin Urine: NEGATIVE
Glucose, UA: NEGATIVE
Hgb urine dipstick: NEGATIVE
Ketones, ur: NEGATIVE
Leukocytes,Ua: NEGATIVE
Nitrite: NEGATIVE
Protein, ur: NEGATIVE
Specific Gravity, Urine: 1.009 (ref 1.001–1.03)
pH: 6 (ref 5.0–8.0)

## 2018-04-23 LAB — COMPLETE METABOLIC PANEL WITH GFR
AG Ratio: 1.4 (calc) (ref 1.0–2.5)
ALT: 8 U/L (ref 6–29)
AST: 18 U/L (ref 10–35)
Albumin: 4.1 g/dL (ref 3.6–5.1)
Alkaline phosphatase (APISO): 76 U/L (ref 37–153)
BUN: 16 mg/dL (ref 7–25)
CO2: 33 mmol/L — ABNORMAL HIGH (ref 20–32)
Calcium: 9.9 mg/dL (ref 8.6–10.4)
Chloride: 101 mmol/L (ref 98–110)
Creat: 0.87 mg/dL (ref 0.60–0.88)
GFR, Est African American: 72 mL/min/{1.73_m2} (ref >=60)
GFR, Est Non African American: 62 mL/min/{1.73_m2} (ref >=60)
GLUCOSE: 81 mg/dL (ref 65–99)
Globulin: 3 g/dL (calc) (ref 1.9–3.7)
Potassium: 4.1 mmol/L (ref 3.5–5.3)
SODIUM: 138 mmol/L (ref 135–146)
Total Bilirubin: 0.4 mg/dL (ref 0.2–1.2)
Total Protein: 7.1 g/dL (ref 6.1–8.1)

## 2018-04-23 LAB — MAGNESIUM: Magnesium: 1.9 mg/dL (ref 1.5–2.5)

## 2018-04-23 LAB — TSH: TSH: 3.25 mIU/L (ref 0.40–4.50)

## 2018-04-23 LAB — HEMOGLOBIN A1C
Hgb A1c MFr Bld: 5.5 % of total Hgb (ref <5.7)
Mean Plasma Glucose: 111 (calc)
eAG (mmol/L): 6.2 (calc)

## 2018-04-23 LAB — VITAMIN D 25 HYDROXY (VIT D DEFICIENCY, FRACTURES): Vit D, 25-Hydroxy: 49 ng/mL (ref 30–100)

## 2018-04-23 LAB — INSULIN, RANDOM: Insulin: 6.4 u[IU]/mL

## 2018-04-23 LAB — MICROALBUMIN / CREATININE URINE RATIO
Creatinine, Urine: 38 mg/dL (ref 20–275)
Microalb Creat Ratio: 5 mcg/mg creat (ref <30)
Microalb, Ur: 0.2 mg/dL

## 2018-04-23 NOTE — Telephone Encounter (Signed)
Patient is calling back. CB is [phone]

## 2018-04-24 NOTE — Telephone Encounter (Signed)
Called pt, her husband answered. Asked him to tell her I called, and that her Xolair will be here 04/28/2018. Please call Mon. And make an appt. To come in. Nothing further needed.

## 2018-04-26 ENCOUNTER — Encounter: Payer: Self-pay | Admitting: Internal Medicine

## 2018-04-28 ENCOUNTER — Telehealth: Payer: Self-pay | Admitting: Internal Medicine

## 2018-04-28 ENCOUNTER — Other Ambulatory Visit: Payer: Self-pay

## 2018-04-28 ENCOUNTER — Ambulatory Visit (INDEPENDENT_AMBULATORY_CARE_PROVIDER_SITE_OTHER): Payer: Medicare Other

## 2018-04-28 DIAGNOSIS — J4540 Moderate persistent asthma, uncomplicated: Secondary | ICD-10-CM | POA: Diagnosis not present

## 2018-04-28 MED ORDER — OMALIZUMAB 150 MG ~~LOC~~ SOLR
150.0000 mg | Freq: Once | SUBCUTANEOUS | Status: AC
  Administered 2018-04-28: 150 mg via SUBCUTANEOUS

## 2018-04-28 NOTE — Telephone Encounter (Signed)
Prefilled Syringes: # 150mg  1  #75mg  0 Arrival Date: 04/28/2018 Lot #: 150mg  [id number]      75mg  0 Exp Date: 150mg  01/2019   75mg  0

## 2018-04-28 NOTE — Telephone Encounter (Signed)
Prefilled Syringe: #150mg  1  #75mg  0 Ordered Date: 04/27/2018 Shipping Date: 04/27/2018

## 2018-05-22 ENCOUNTER — Ambulatory Visit: Payer: Medicare Other | Admitting: Adult Health Nurse Practitioner

## 2018-05-22 ENCOUNTER — Encounter: Payer: Self-pay | Admitting: Adult Health Nurse Practitioner

## 2018-05-22 VITALS — Ht 59.0 in | Wt 128.0 lb

## 2018-05-22 DIAGNOSIS — Z0001 Encounter for general adult medical examination with abnormal findings: Secondary | ICD-10-CM | POA: Diagnosis not present

## 2018-05-22 DIAGNOSIS — Z6824 Body mass index (BMI) 24.0-24.9, adult: Secondary | ICD-10-CM

## 2018-05-22 DIAGNOSIS — Z0000 Encounter for general adult medical examination without abnormal findings: Secondary | ICD-10-CM

## 2018-05-22 DIAGNOSIS — F3341 Major depressive disorder, recurrent, in partial remission: Secondary | ICD-10-CM

## 2018-05-22 DIAGNOSIS — E039 Hypothyroidism, unspecified: Secondary | ICD-10-CM | POA: Diagnosis not present

## 2018-05-22 DIAGNOSIS — R42 Dizziness and giddiness: Secondary | ICD-10-CM | POA: Diagnosis not present

## 2018-05-22 DIAGNOSIS — R6889 Other general symptoms and signs: Secondary | ICD-10-CM | POA: Diagnosis not present

## 2018-05-22 DIAGNOSIS — E785 Hyperlipidemia, unspecified: Secondary | ICD-10-CM

## 2018-05-22 DIAGNOSIS — Z79899 Other long term (current) drug therapy: Secondary | ICD-10-CM

## 2018-05-22 DIAGNOSIS — R7309 Other abnormal glucose: Secondary | ICD-10-CM | POA: Diagnosis not present

## 2018-05-22 DIAGNOSIS — E559 Vitamin D deficiency, unspecified: Secondary | ICD-10-CM | POA: Diagnosis not present

## 2018-05-22 DIAGNOSIS — J4540 Moderate persistent asthma, uncomplicated: Secondary | ICD-10-CM

## 2018-05-22 MED ORDER — LORATADINE 10 MG PO TABS: 10.0000 mg | ORAL_TABLET | Freq: Every day | ORAL | 1 refills | Status: DC | PRN

## 2018-05-22 MED ORDER — LEVOTHYROXINE SODIUM 75 MCG PO TABS: ORAL_TABLET | ORAL | 2 refills | Status: DC

## 2018-05-22 NOTE — Progress Notes (Signed)
Virtual Visit via Telephone Note  I connected with@ on 05/24/18 at  9:30 AM EDT by telephone and verified that I am speaking with the correct person using two identifiers.   I discussed the limitations, risks, security and privacy concerns of performing an evaluation and management service by telephone and the availability of in person appointments. I also discussed with the patient that there may be a patient responsible charge related to this service. The patient expressed understanding and agreed to proceed.    MEDICARE ANNUAL WELLNESS VISIT AND FOLLOW UP Assessment:   Diagnoses and all orders for this visit:  Encounter for Medicare annual wellness exam Yearly  Moderate persistent extrinsic asthma without complication Doing well Avoid triggers, pollen, heavy now has not been outside much related to this.  -     loratadine (CLARITIN) 10 MG tablet; Take 1 tablet (10 mg total) by mouth daily as needed for allergies. Using Pulmicort neb daily Using Perforomist neb BID Receiving monthly injections, Xolair 150mg   Depression, major, recurrent, in partial remission ([place]) Doing well on current regiment Continue Zoloft with benefit  Hyperlipidemia, unspecified hyperlipidemia type Diet controlled Will check lipids next visit  Hypothyroidism, unspecified type -     levothyroxine (SYNTHROID) 75 MCG tablet; TAKE 1 TABLET DAILY Defer labs, check next visit. Asymptomatic  Vertigo Doing well at this time Unknown triggers Has meclizine PRN, last use one month ago.  Vitamin D deficiency Continue supplimention Will check labs next visit  Other abnormal glucose Discussed dietary and exercise modifications  BMI 24.0-24.9, adult Discussed dietary and exercise modifications  Follow Up Instructions:    I discussed the assessment and treatment plan with the patient. The patient was provided an opportunity to ask questions and all were answered. The patient agreed with the plan and  demonstrated an understanding of the instructions.   The patient was advised to call back or seek an in-person evaluation if the symptoms worsen or if the condition fails to improve as anticipated.  I provided 30 minutes of non-face-to-face time during this encounter including counseling, chart review, and critical decision making was performed.  Future Appointments  Date Time Provider [place]  06/01/2018  1:30 PM [facility] None  10/29/2018  3:00 PM Liane Comber, NP [facility] None  06/08/2019  3:00 PM [name], MD [facility] None     Plan:   During the course of the visit the patient was educated and counseled about appropriate screening and preventive services including:    Pneumococcal vaccine   Influenza vaccine  Prevnar 13  Td vaccine  Screening electrocardiogram, deferred, COVID19, telephone visit.  Colorectal cancer screening  Diabetes screening  Glaucoma screening  Nutrition counseling    Subjective:  Kathy Whitaker is a 85 y.o. female who presents for Medicare Annual Wellness Visit and 3 month follow up for hypothyroidism, hyperlipidemia, abnormal glucose, vertigo depression and vitamin D Def.   Her blood pressure has been controlled, she does not check her blood pressure at home.  She does not have supplies to check this at home today.  She does not workout. She denies chest pain, shortness of breath, dizziness.  She is not on cholesterol medication and denies myalgias. Her cholesterol is not at goal. The cholesterol last visit was:   Lab Results  Component Value Date   CHOL 286 (H) 04/22/2018   HDL 70 04/22/2018   LDLCALC 181 (H) 04/22/2018   TRIG 190 (H) 04/22/2018   CHOLHDL 4.1 04/22/2018   She has not  been working on diet and exercise for abnormal glucose, and denies hyperglycemia, hypoglycemia , increased appetite, nausea, paresthesia of the feet, polydipsia, polyuria, visual disturbances and vomiting. Last A1C  in the office was:  Lab Results  Component Value Date   HGBA1C 5.5 04/22/2018   Last GFR Lab Results  Component Value Date   GFRNONAA 62 04/22/2018     Lab Results  Component Value Date   GFRAA 72 04/22/2018   Patient is on Vitamin D supplement.   Lab Results  Component Value Date   VD25OH 49 04/22/2018      Medication Review:  Current Outpatient Medications (Endocrine & Metabolic):  .  levothyroxine (SYNTHROID) 75 MCG tablet, TAKE 1 TABLET DAILY   Current Outpatient Medications (Cardiovascular):  [name]  EPINEPHrine (EPIPEN JR) 0.15 MG/0.3ML injection, Inject 0.15 mg into the muscle as needed.     Current Outpatient Medications (Respiratory):  .  budesonide (PULMICORT) 0.25 MG/2ML nebulizer solution, Take 2 mLs (0.25 mg total) by nebulization daily. Once daily and will take again if needed .  formoterol (PERFOROMIST) 20 MCG/2ML nebulizer solution, Take 2 mLs (20 mcg total) by nebulization 2 (two) times daily. .  Ipratropium-Albuterol (COMBIVENT RESPIMAT) 20-100 MCG/ACT AERS respimat, USE 1 INHALATION EVERY 6 HOURS AS NEEDED FOR WHEEZING .  loratadine (CLARITIN) 10 MG tablet, Take 1 tablet (10 mg total) by mouth daily as needed for allergies.  Current Facility-Administered Medications (Respiratory):  .  omalizumab Arvid Right) injection 150 mg .  omalizumab Arvid Right) injection 150 mg      Current Outpatient Medications (Other):  [name]  Cholecalciferol (VITAMIN D PO), Take 2,000 Units by mouth. .  meclizine (ANTIVERT) 25 MG tablet, Take 1/2 to 1 tablet 3 x /day as needed for dizziness / Vertigo .  sertraline (ZOLOFT) 50 MG tablet, Take 1 tablet (50 mg total) by mouth daily.   Allergies: Allergies  Allergen Reactions  . Codeine Nausea Only  . Propoxyphene N-Acetaminophen Nausea Only  . Shellfish-Derived Products Rash    Current Problems (verified) has Extrinsic asthma; GERD; Other abnormal glucose; Vitamin D deficiency; Hypothyroidism; Hyperlipemia; Essential hypertension;  Medication management; BMI 24.0-24.9, adult; Encounter for Medicare annual wellness exam; Vertigo; Hearing loss; and Depression, major, recurrent, in partial remission ([place]) on their problem list.  Screening Tests Immunization History  Administered Date(s) Administered  . Influenza Whole 10/05/2009, 11/26/2010, 11/05/2011  . Influenza, High Dose Seasonal PF 11/20/2012, 11/22/2013, 11/21/2014, 11/10/2015, 11/19/2016, 11/20/2017  . Pneumococcal Conjugate-13 11/22/2013  . Pneumococcal Polysaccharide-23 09/14/2008  . Td 10/09/2004, 03/12/2017  . Zoster 02/15/2013    Preventative care: Last colonoscopy: 2015 Mammogram: 2018, DUE? Dr Velda Shell   Prior vaccinations:  TD or Tdap: 2019  Influenza: 2019 Pneumococcal: 2010 Prevnar13: 2015 Shingles/Zostavax: 2015  Names of Other Physician/Practitioners you currently use: 1. [facility] here for primary care 2. Eye Exam: DUE 3. Dential Exam: 2019  Patient Care Team: [name], MD as PCP - General (Internal Medicine) Elsie Stain, MD (Pulmonary Disease) Arvella Nigh, MD as Consulting Physician (Obstetrics and Gynecology)  Surgical: She  has a past surgical history that includes Tubal ligation (Bilateral, 1970); Tonsillectomy; Wisdom tooth extraction; and Dilatation & currettage/hysteroscopy with resectoscope (N/A, 09/10/2013). Family Her family history includes Cancer in her brother; Gout in her brother; Hodgkin's lymphoma in her brother; Other in her father; Thyroid disease in her mother. Social history  She reports that she has never smoked. She has never used smokeless tobacco. She reports current alcohol use. She reports that she  does not use drugs.  MEDICARE WELLNESS OBJECTIVES: Physical activity: Current Exercise Habits: The patient has a physically strenuous job, but has no regular exercise apart from work. Cardiac risk factors:   Depression/mood screen:   Depression screen [facility] 2/9  05/22/2018  Decreased Interest 0  Down, Depressed, Hopeless 0  PHQ - 2 Score 0    ADLs:  In your present state of health, do you have any difficulty performing the following activities: 05/22/2018 04/26/2018  Hearing? N N  Vision? N N  Difficulty concentrating or making decisions? N N  Walking or climbing stairs? N N  Dressing or bathing? N N  Doing errands, shopping? N N  Preparing Food and eating ? N -  Using the Toilet? N -  In the past six months, have you accidently leaked urine? N -  Do you have problems with loss of bowel control? N -  Managing your Medications? N -  Managing your Finances? N -  Housekeeping or managing your Housekeeping? N -  Some recent data might be hidden     Cognitive Testing  Alert? Yes  Normal Appearance?Yes  Oriented to person? Yes  Place? Yes   Time? Yes  Recall of three objects?  Yes  Can perform simple calculations? Yes  Displays appropriate judgment?Yes  Can read the correct time from a watch face?Yes  EOL planning: Does Patient Have a Medical Advance Directive?: Yes Type of Advance Directive: Healthcare Power of Attorney, Living will Does patient want to make changes to medical advance directive?: Yes (Inpatient - patient defers changing a medical advance directive at this time - Information given)   Objective:   Today's Vitals   05/24/18 1331  Weight: 128 lb (58.1 kg)  Height: 4\' 11"  (1.499 m)   Body mass index is 25.85 kg/m.  General : Well sounding patient in no apparent distress HEENT: no hoarseness, no cough for duration of visit Lungs: speaks in complete sentences, no audible wheezing, no apparent distress Neurological: alert, oriented x 3 Psychiatric: pleasant, judgement appropriate   Medicare Attestation I have personally reviewed: The patient's medical and social history Their use of alcohol, tobacco or illicit drugs Their current medications and supplements The patient's functional ability including ADLs,fall risks,  home safety risks, cognitive, and hearing and visual impairment Diet and physical activities Evidence for depression or mood disorders  The patient's weight, height, BMI, have been recorded in the chart.  I have made referrals, counseling, and provided education to the patient based on review of the above and I have provided the patient with a written personalized care plan for preventive services.     Garnet Sierras, NP [facility] 05/22/2018  9:30am

## 2018-05-24 NOTE — Patient Instructions (Addendum)
05/22/18 you had a telephone visit with Garnet Sierras, DNP.  Below is a summary of your visit.   Ms. Zellers , Thank you for taking time to come for your Medicare Wellness Visit. I appreciate your ongoing commitment to your health goals. Please review the following plan we discussed and let me know if I can assist you in the future.    This is a list of the screening recommended for you and due dates:  Health Maintenance  Topic Date Due   Flu Shot  09/05/2018   Tetanus Vaccine  03/13/2027   DEXA scan (bone density measurement)  Completed   Pneumonia vaccines  Completed     Here are some simple exercises I like you can do from a chair.  This is great if you are not able to get outdoors or restricted by your breathing.  Step-By-Step Chair Exercises  To maximize your chair exercise routine, you'll need a set of small dumbbells weighing anywhere from 5 to 8 pounds each, as well as a resistance band. Start your routine by doing a couple of exercises, then gradually add more to your routine. Repeat each movement 3 to 10 times.     1. Sitting Side Tilt  This is a great exercise to begin with because it warms up the body while improving core strength. Sit up straight in your chair and tilt your upper body as far as you can to the left, return to center, then repeat the exercise on your right.        2. Back Archer  The Back Fernande Boyden will help to improve posture and open up your chest. Clasp your hands behind your back. Arch your back and push your hands away from you. Hold for 1-3 seconds, then release your hands and relax your back. Repeat.     3. Shoulder Arcs  This exercise strengthens the shoulders and works the rotator cuffs, which tend to be a problem area for older adults as those muscles are used less often. Hold a weight in each hand, palms facing in. Move your arms forward and up to shoulder level, then lower. You can also try moving your arms out to the side and raising  them to shoulder height, then lowering them. As you build strength, raise your arms as high as possible over your head before lowering them. Repeat.      4. Toe Up  The Toe Up strengthens your calves. Press toes into the floor to raise your knees, then lower and press heels into the floor. Repeat.       5. Sitting Quad Set  If you spend a significant amount of time in the sitting position, it is not uncommon to see muscle mass loss in your legs. To prevent muscle loss, the Sitting Quad Set will help keep your muscles strong and active. Raise your left foot to straighten your leg, tightening the muscle in your thigh. Hold for 10-30 seconds, then lower. Repeat with your right leg.     6. Chair Push-up  Chair Push-ups can greatly improve stability and strengthen your core. Though this exercise is slightly more advanced, you can work up to it with time. Grip the arms of the chair and press down to push your buttocks up. Lower yourself back into the chair. Repeat.     7. Side Leg Kick  For improved balance and strengthening your legs, try the Side Leg Kick exercise. Stand up, holding the back of the chair for balance,  and raise your left leg out to the side. Lower your left leg, then repeat with your right leg.    8. Leg Swing  The Leg Swing is another exercise that helps improve your balance and strengthen the legs. Stand behind your chair, holding the top for balance, and lift your left leg out in front of you. Swing your left leg out behind you, then bring back to the middle. Repeat with your right leg.     9. Ankle Bend  This exercise primarily works to improve your balance. Holding your chair for support, press your left heel into the floor and gently bend your ankle and raise your toes, keeping your body straight throughout the exercise. Lower your toes and repeat with right leg.    If you are just getting started with chair exercises, avoid injury by having another person  there for support. They can help make sure you are using good form for each exercise. Listen to your body and start out slowly in the beginning.  Do what you can, start with two and slowly add exercise every 3-4 days.  This will help to work your muscles to increase stability and bone health.    Below is some general information about COVID19.   Coronavirus (COVID-19) Are you at risk?  Are you at risk for the Coronavirus (COVID-19)?  To be considered HIGH RISK for Coronavirus (COVID-19), you have to meet the following criteria:   Traveled to [place], [place], [place], [place] or [place]; or in the [place] to [place], [place], [place], or [place]; and have fever, cough, and shortness of breath within the last 2 weeks of travel OR  Been in close contact with a person diagnosed with COVID-19 within the last 2 weeks and have fever, cough, and shortness of breath  IF YOU DO NOT MEET THESE CRITERIA, YOU ARE CONSIDERED LOW RISK FOR COVID-19.  What to do if you are HIGH RISK for COVID-19?   If you are having a medical emergency, call 911.  Seek medical care right away. Before you go to a doctors office, urgent care or emergency department, call ahead and tell them about your recent travel, contact with someone diagnosed with COVID-19, and your symptoms. You should receive instructions from your physicians office regarding next steps of care.   When you arrive at healthcare provider, tell the healthcare staff immediately you have returned from visiting [place], [place], [place], [place] or [place]; or traveled in the [place] to [place], [place], [place], or [place]; in the last two weeks or you have been in close contact with a person diagnosed with COVID-19 in the last 2 weeks.    Tell the health care staff about your symptoms: fever, cough and shortness of breath.  After you have been seen by a medical provider, you will be either: o Tested for (COVID-19) and  discharged home on quarantine except to seek medical care if symptoms worsen, and asked to  - Stay home and avoid contact with others until you get your results (4-5 days)  - Avoid travel on public transportation if possible (such as bus, train, or airplane) or o Sent to the Emergency Department by EMS for evaluation, COVID-19 testing, and possible admission depending on your condition and test results.  What to do if you are LOW RISK for COVID-19?  Reduce your risk of any infection by using the same precautions used for avoiding the common cold or flu:  Wash your hands often with soap and warm water for at least 20 seconds.  If soap and water are not readily available, use an alcohol-based hand sanitizer with at least 60% alcohol.   If coughing or sneezing, cover your mouth and nose by coughing or sneezing into the elbow areas of your shirt or coat, into a tissue or into your sleeve (not your hands).  Avoid shaking hands with others and consider head nods or verbal greetings only.  Avoid touching your eyes, nose, or mouth with unwashed hands.   Avoid close contact with people who are sick.  Avoid places or events with large numbers of people in one location, like concerts or sporting events.  Carefully consider travel plans you have or are making.  If you are planning any travel outside or inside the [place], visit the [place] webpage for the latest health notices.  If you have some symptoms but not all symptoms, continue to monitor at home and seek medical attention if your symptoms worsen.  If you are having a medical emergency, call 911.   [place] / e-Visit: [email or web address]         [facility]: [facility]: [phone]                   [facility]: [phone]

## 2018-05-25 ENCOUNTER — Other Ambulatory Visit: Payer: Self-pay

## 2018-06-01 ENCOUNTER — Ambulatory Visit: Payer: Medicare Other

## 2018-06-02 ENCOUNTER — Other Ambulatory Visit: Payer: Self-pay

## 2018-06-02 ENCOUNTER — Ambulatory Visit (INDEPENDENT_AMBULATORY_CARE_PROVIDER_SITE_OTHER): Payer: Medicare Other

## 2018-06-02 DIAGNOSIS — J4540 Moderate persistent asthma, uncomplicated: Secondary | ICD-10-CM | POA: Diagnosis not present

## 2018-06-02 MED ORDER — OMALIZUMAB 150 MG ~~LOC~~ SOLR
150.0000 mg | Freq: Once | SUBCUTANEOUS | Status: AC
  Administered 2018-06-02: 150 mg via SUBCUTANEOUS

## 2018-06-02 MED ORDER — OMALIZUMAB 150 MG ~~LOC~~ SOLR
150.0000 mg | SUBCUTANEOUS | Status: DC
  Administered 2018-06-02: 150 mg via SUBCUTANEOUS

## 2018-06-02 NOTE — Progress Notes (Signed)
Have you been hospitalized within the last 10 days?  No Do you have a fever?  No Do you have a cough?  No Do you have a headache or sore throat? No  

## 2018-07-09 ENCOUNTER — Telehealth: Payer: Self-pay | Admitting: Internal Medicine

## 2018-07-09 NOTE — Telephone Encounter (Signed)
Spoke with pt's husband. I asked him to leave her a message, Please cb and schedule a Xolair appt.. Pt missed her May inj..(06/30/2018) Pt called back, I scheduled her for 2:00 07/10/2018. Nothing further needed.

## 2018-07-10 ENCOUNTER — Other Ambulatory Visit: Payer: Self-pay

## 2018-07-10 ENCOUNTER — Ambulatory Visit (INDEPENDENT_AMBULATORY_CARE_PROVIDER_SITE_OTHER): Payer: Medicare Other

## 2018-07-10 DIAGNOSIS — J4540 Moderate persistent asthma, uncomplicated: Secondary | ICD-10-CM | POA: Diagnosis not present

## 2018-07-10 MED ORDER — OMALIZUMAB 150 MG ~~LOC~~ SOLR
150.0000 mg | Freq: Once | SUBCUTANEOUS | Status: AC
  Administered 2018-07-10: 150 mg via SUBCUTANEOUS

## 2018-07-10 NOTE — Progress Notes (Signed)
Have you been hospitalized within the last 10 days?  No Do you have a fever?  No Do you have a cough?  No Do you have a headache or sore throat? No  

## 2018-08-03 ENCOUNTER — Telehealth: Payer: Self-pay | Admitting: Internal Medicine

## 2018-08-03 NOTE — Telephone Encounter (Signed)
Xolair Prefilled Syringe Order: 150mg  Prefilled Syringe:  #1 75mg  Prefilled Syringe: N/A Ordered Date: 08/03/2018 Expected date of arrival: 08/04/2018 Ordered by: Desmond Dike, [place]

## 2018-08-04 NOTE — Telephone Encounter (Signed)
Xolair Prefilled Syringe Received:  150mg  Prefilled Syringe >> quantity 1, lot # [id number], exp date 4//2021 Medication arrival date: 08/04/2018 Received by: TBS

## 2018-08-12 ENCOUNTER — Other Ambulatory Visit: Payer: Self-pay

## 2018-08-12 ENCOUNTER — Ambulatory Visit (INDEPENDENT_AMBULATORY_CARE_PROVIDER_SITE_OTHER): Payer: Medicare Other

## 2018-08-12 DIAGNOSIS — J4540 Moderate persistent asthma, uncomplicated: Secondary | ICD-10-CM

## 2018-08-12 MED ORDER — OMALIZUMAB 150 MG/ML ~~LOC~~ SOSY
150.0000 mg | PREFILLED_SYRINGE | Freq: Once | SUBCUTANEOUS | Status: AC
  Administered 2018-08-12: 150 mg via SUBCUTANEOUS

## 2018-08-12 NOTE — Progress Notes (Signed)
Have you been hospitalized within the last 10 days?  No Do you have a fever?  No Do you have a cough?  No Do you have a headache or sore throat? No  I asked pt questions before I gave her her inj.[name]

## 2018-09-07 ENCOUNTER — Telehealth: Payer: Self-pay | Admitting: Internal Medicine

## 2018-09-07 NOTE — Telephone Encounter (Signed)
Xolair Prefilled Syringe Order: 150mg  Prefilled Syringe:  #1 75mg  Prefilled Syringe: n/a Ordered Date: 09/07/18  Expected date of arrival: 09/08/18  Ordered by: Parke Poisson, [place]

## 2018-09-08 NOTE — Telephone Encounter (Signed)
Xolair Prefilled Syringe Received:  150mg  Prefilled Syringe >> quantity 1, lot # [id number], exp date 06/05/2019 75mg  Prefilled Syringe >>N/A Medication arrival date: 09/08/18 Received by: Lattie Haw T,LPN

## 2018-09-15 ENCOUNTER — Ambulatory Visit (INDEPENDENT_AMBULATORY_CARE_PROVIDER_SITE_OTHER): Payer: Medicare Other

## 2018-09-15 ENCOUNTER — Other Ambulatory Visit: Payer: Self-pay

## 2018-09-15 DIAGNOSIS — J4540 Moderate persistent asthma, uncomplicated: Secondary | ICD-10-CM

## 2018-09-15 MED ORDER — OMALIZUMAB 150 MG/ML ~~LOC~~ SOSY
150.0000 mg | PREFILLED_SYRINGE | Freq: Once | SUBCUTANEOUS | Status: AC
  Administered 2018-09-15: 150 mg via SUBCUTANEOUS

## 2018-09-15 NOTE — Progress Notes (Signed)
Have you been hospitalized within the last 10 days?  No Do you have a fever?  No Do you have a cough?  No Do you have a headache or sore throat? No Do you have your Epi Pen visible and is it within date?  Yes 

## 2018-09-21 ENCOUNTER — Telehealth: Payer: Self-pay | Admitting: *Deleted

## 2018-09-21 ENCOUNTER — Other Ambulatory Visit: Payer: Self-pay | Admitting: *Deleted

## 2018-09-21 DIAGNOSIS — E039 Hypothyroidism, unspecified: Secondary | ICD-10-CM

## 2018-09-21 MED ORDER — LEVOTHYROXINE SODIUM 75 MCG PO TABS: ORAL_TABLET | ORAL | 2 refills | Status: DC

## 2018-09-21 MED ORDER — SIMVASTATIN 40 MG PO TABS: ORAL_TABLET | ORAL | 2 refills | Status: DC

## 2018-09-21 NOTE — Telephone Encounter (Signed)
Entered in error

## 2018-10-05 ENCOUNTER — Telehealth: Payer: Self-pay | Admitting: Internal Medicine

## 2018-10-05 NOTE — Telephone Encounter (Signed)
Xolair Prefilled Syringe Order: 150mg  Prefilled Syringe:  #1 75mg  Prefilled Syringe: #N/A Ordered Date: 10/05/18 Expected date of arrival: 10/06/2018 Ordered by: [facility]: Nigel Mormon

## 2018-10-06 NOTE — Telephone Encounter (Signed)
Xolair Prefilled Syringe Received:  150mg  Prefilled Syringe >> quantity 1 lot # [id number], exp date 07/06/2019 75mg  Prefilled Syringe >>N/A Medication arrival date: 10/06/18 Received by: Lattie Haw T,LPN

## 2018-10-15 ENCOUNTER — Ambulatory Visit (INDEPENDENT_AMBULATORY_CARE_PROVIDER_SITE_OTHER): Payer: Medicare Other

## 2018-10-15 ENCOUNTER — Other Ambulatory Visit: Payer: Self-pay

## 2018-10-15 DIAGNOSIS — J4540 Moderate persistent asthma, uncomplicated: Secondary | ICD-10-CM

## 2018-10-15 MED ORDER — OMALIZUMAB 150 MG/ML ~~LOC~~ SOSY
150.0000 mg | PREFILLED_SYRINGE | Freq: Once | SUBCUTANEOUS | Status: AC
  Administered 2018-10-15: 14:00:00 150 mg via SUBCUTANEOUS

## 2018-10-15 NOTE — Progress Notes (Signed)
All questions were answered by the patient before medication was administered. Have you been hospitalized in the last 10 days? No Do you have a fever? No Do you have a cough? No Do you have a headache or sore throat? No  

## 2018-10-28 NOTE — Progress Notes (Signed)
FOLLOW UP  Assessment and Plan:   Moderate persistent extrinsic asthma without complication Doing well Avoid triggers, pollen, heavy now has not been outside much related to this. Using Pulmicort neb daily Using Perforomist neb BID Receiving monthly injections, Xolair 150mg   Depression, major, recurrent, in partial remission ([place]) Doing well on current regiment Continue Zoloft with benefit  Hyperlipidemia, unspecified hyperlipidemia type Continue medications: pravastatin 40 mg daily  Continue low cholesterol diet and exercise.  Check lipid panel.   Hypothyroidism, unspecified type continue medications the same pending lab results reminded to take on an empty stomach 30-54mins before food.  check TSH level  Vitamin D deficiency Continue supplimention; she reports increased to 5000 IU now Check vitamin D level  Other abnormal glucose Discussed dietary and exercise modifications  BMI 25 Discussed dietary and exercise modifications  Continue diet and meds as discussed. Further disposition pending results of labs. Discussed med's effects and SE's.   Over 30 minutes of exam, counseling, chart review, and critical decision making was performed.   Future Appointments  Date Time Provider [place]  11/17/2018  2:30 PM [facility]JECTION [facility] None  05/25/2019 10:00 AM Garnet Sierras, NP [facility] None  06/08/2019  3:00 PM [name], MD [facility] None    ----------------------------------------------------------------------------------------------------------------------  HPI 85 y.o. female  presents for 3 month follow up on hypertension, cholesterol, glucose, weight, asthma and vitamin D deficiency.   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  On zoloft 50 mg daily for mood and feels symptoms are currently well controlled.    Patient has history of  Asthma/allergies, follows with Dr. [name]. She is on pulmicort/performist once a day, combivent PRN, 2-3 times per month, and claritin and xolair injections that have helped significantly. Cold weather can be problematic but doing well recently.   Following with neurologist, Dr. Krista Blue, for vertigo. MRI on 12/14/2016 was unremarkable excepting age-appropriate microvascular changes. She reports symptoms have since improved, meclizine does improve symptoms significantly.   BMI is Body mass index is 25.89 kg/m., she has not been working on diet and exercise. Wt Readings from Last 3 Encounters:  10/29/18 128 lb 3.2 oz (58.2 kg)  05/24/18 128 lb (58.1 kg)  04/22/18 128 lb 9.6 oz (58.3 kg)   Today their BP is BP: 110/60  She does not workout, full time caregiver, very active around the house, up and down steps. She denies chest pain, shortness of breath, dizziness.   She is on cholesterol medication Simvastatin 40 mg dailyand denies myalgias. Her cholesterol is not at goal at last check, apparently she was off of the medication at that time. The cholesterol last visit was:    Lab Results  Component Value Date   CHOL 286 (H) 04/22/2018   HDL 70 04/22/2018   LDLCALC 181 (H) 04/22/2018   TRIG 190 (H) 04/22/2018   CHOLHDL 4.1 04/22/2018    She has not been working on diet and exercise for glucose management, and denies nausea, paresthesia of the feet, polydipsia, polyuria, visual disturbances and vomiting. Last A1C in the office was:  Lab Results  Component Value Date   HGBA1C 5.5 04/22/2018   Lab Results  Component Value Date   GFRNONAA 62 04/22/2018   Patient is on Vitamin D supplement, has been taking 5000 IU   Lab Results  Component Value Date   VD25OH 49 04/22/2018  Current Medications:  Current Outpatient Medications on File Prior to Visit  Medication Sig  . budesonide (PULMICORT) 0.25 MG/2ML nebulizer solution Take 2 mLs (0.25 mg total) by nebulization daily. Once daily  and will take again if needed  . Cholecalciferol (VITAMIN D PO) Take 2,000 Units by mouth.  . EPINEPHrine (EPIPEN JR) 0.15 MG/0.3ML injection Inject 0.15 mg into the muscle as needed.    . formoterol (PERFOROMIST) 20 MCG/2ML nebulizer solution Take 2 mLs (20 mcg total) by nebulization 2 (two) times daily.  . Ipratropium-Albuterol (COMBIVENT RESPIMAT) 20-100 MCG/ACT AERS respimat USE 1 INHALATION EVERY 6 HOURS AS NEEDED FOR WHEEZING  . levothyroxine (SYNTHROID) 75 MCG tablet TAKE 1 TABLET DAILY  . loratadine (CLARITIN) 10 MG tablet Take 1 tablet (10 mg total) by mouth daily as needed for allergies.  [name] meclizine (ANTIVERT) 25 MG tablet Take 1/2 to 1 tablet 3 x /day as needed for dizziness / Vertigo  . sertraline (ZOLOFT) 50 MG tablet Take 1 tablet (50 mg total) by mouth daily.  . simvastatin (ZOCOR) 40 MG tablet Take 1 tablet daily for cholesterol.   Current Facility-Administered Medications on File Prior to Visit  Medication  . omalizumab Arvid Right) injection 150 mg  . omalizumab Arvid Right) injection 150 mg     Allergies:  Allergies  Allergen Reactions  . Codeine Nausea Only  . Propoxyphene N-Acetaminophen Nausea Only  . Shellfish-Derived Products Rash     Medical History:  Past Medical History:  Diagnosis Date  . Asthma   . COPD (chronic obstructive pulmonary disease) ([place])   . Depression   . Dizziness   . Hyperlipemia   . Hypertension   . Hypothyroidism   . Prediabetes   . SVD (spontaneous vaginal delivery)    x 2  . Vitamin D deficiency    Family history- Reviewed and unchanged Social history- Reviewed and unchanged   Review of Systems:  Review of Systems  Constitutional: Negative for malaise/fatigue and weight loss.  HENT: Negative for hearing loss and tinnitus.   Eyes: Negative for blurred vision and double vision.  Respiratory: Negative for cough, sputum production, shortness of breath and wheezing.   Cardiovascular: Negative for chest pain, palpitations,  orthopnea, claudication, leg swelling and PND.  Gastrointestinal: Negative for abdominal pain, blood in stool, constipation, diarrhea, heartburn, melena, nausea and vomiting.  Genitourinary: Negative.   Musculoskeletal: Negative for falls, joint pain and myalgias.  Skin: Negative for rash.  Neurological: Negative for dizziness, tingling, sensory change, weakness and headaches.  Endo/Heme/Allergies: Negative for polydipsia.  Psychiatric/Behavioral: Negative.  Negative for depression, memory loss, substance abuse and suicidal ideas. The patient is not nervous/anxious and does not have insomnia.   All other systems reviewed and are negative.   Physical Exam: BP 110/60   Pulse 64   Temp (!) 97.5 F (36.4 C)   Wt 128 lb 3.2 oz (58.2 kg)   SpO2 95%   BMI 25.89 kg/m  Wt Readings from Last 3 Encounters:  10/29/18 128 lb 3.2 oz (58.2 kg)  05/24/18 128 lb (58.1 kg)  04/22/18 128 lb 9.6 oz (58.3 kg)   General Appearance: Well nourished, in no apparent distress. Eyes: PERRLA, EOMs, conjunctiva no swelling or erythema Sinuses: No Frontal/maxillary tenderness ENT/Mouth: Ext aud canals clear, TMs without erythema, bulging. No erythema, swelling, or exudate on post pharynx.  Tonsils not swollen or erythematous. Hearing normal.  Neck: Supple, thyroid normal.  Respiratory: Respiratory effort normal, BS equal bilaterally without rales, rhonchi, wheezing or stridor.  Cardio: RRR with  no MRGs. Brisk peripheral pulses without edema.  Abdomen: Soft, + BS.  Non tender, no guarding, rebound, hernias, masses. Lymphatics: Non tender without lymphadenopathy.  Musculoskeletal: Full ROM, mild effusion without erythema or warmth to left knee, 5/5 strength, Normal gait Skin: Warm, dry without rashes, lesions, ecchymosis.  Neuro: Cranial nerves intact. No cerebellar symptoms.  Psych: Awake and oriented X 3, normal affect, Insight and Judgment appropriate.    Izora Ribas, NP 3:16 PM [facility]

## 2018-10-29 ENCOUNTER — Other Ambulatory Visit: Payer: Self-pay

## 2018-10-29 ENCOUNTER — Ambulatory Visit (INDEPENDENT_AMBULATORY_CARE_PROVIDER_SITE_OTHER): Payer: Medicare Other | Admitting: Adult Health

## 2018-10-29 ENCOUNTER — Encounter: Payer: Self-pay | Admitting: Adult Health

## 2018-10-29 VITALS — BP 110/60 | HR 64 | Temp 97.5°F | Wt 128.2 lb

## 2018-10-29 DIAGNOSIS — R7309 Other abnormal glucose: Secondary | ICD-10-CM | POA: Diagnosis not present

## 2018-10-29 DIAGNOSIS — J4540 Moderate persistent asthma, uncomplicated: Secondary | ICD-10-CM

## 2018-10-29 DIAGNOSIS — E785 Hyperlipidemia, unspecified: Secondary | ICD-10-CM | POA: Diagnosis not present

## 2018-10-29 DIAGNOSIS — I10 Essential (primary) hypertension: Secondary | ICD-10-CM

## 2018-10-29 DIAGNOSIS — E039 Hypothyroidism, unspecified: Secondary | ICD-10-CM

## 2018-10-29 DIAGNOSIS — Z79899 Other long term (current) drug therapy: Secondary | ICD-10-CM | POA: Diagnosis not present

## 2018-10-29 DIAGNOSIS — Z6825 Body mass index (BMI) 25.0-25.9, adult: Secondary | ICD-10-CM

## 2018-10-29 DIAGNOSIS — F3341 Major depressive disorder, recurrent, in partial remission: Secondary | ICD-10-CM

## 2018-10-29 DIAGNOSIS — E559 Vitamin D deficiency, unspecified: Secondary | ICD-10-CM

## 2018-10-29 NOTE — Patient Instructions (Signed)
Suggest you get a knee sleeve brace to wear daily; avoid aggravating movements as much as possible  tylenol or antiinflammatories as needed; try applying ice for 15-20 min as needed   Knee Effusion  Knee effusion means that you have extra fluid in your knee. This can cause pain. Your knee may be more difficult to bend and move. What are the causes? Common causes are:  Arthritis.  Infection.  Injury.  Autoimmune disease. This means that your body's defense system (immune system) mistakenly attacks healthy body tissues. Follow these instructions at home: Medicines  Take medicines only as told by your doctor.  Do not drive or use heavy machinery while taking prescription pain medicine.  If you are taking prescription pain medicine, take actions to help prevent or treat trouble pooping (constipation). Your doctor may recommend that you: ? Drink enough fluid to keep your pee (urine) pale yellow. ? Eat foods that are high in fiber. These include fresh fruits and vegetables, whole grains, and beans. ? Avoid eating fatty or sweet foods. ? Take a medicine for constipation. If you have a brace:  Wear the brace as told by your doctor. Remove it only as told by your doctor.  Loosen the brace if your toes tingle, become numb, or turn cold and blue.  Keep the brace clean.  If the brace is not waterproof: ? Do not let it get wet. ? Cover it with a watertight covering when you take a bath or a shower. Managing pain, stiffness, and swelling   If told, apply ice to the swollen area: ? If you have a removable brace, remove it as told by your doctor. ? Put ice in a plastic bag. ? Place a towel between your skin and the bag. ? Leave the ice on for 20 minutes, 2-3 times per day.  Keep your knee raised (elevated) when you are sitting or lying down. General instructions  Do not use any products that contain nicotine or tobacco, such as cigarettes and e-cigarettes. These can delay  healing. If you need help quitting, ask your doctor.  Use crutches as told by your doctor.  Do exercises as told by your doctor.  Rest as told by your doctor.  Keep all follow-up visits as told by your doctor. This is important. Contact a doctor if you:  Continue to have pain in your knee. Get help right away if you:  Have swelling or redness of your knee that gets worse or does not get better.  Have serious pain in your knee.  Have a fever. Summary  Knee effusion is when you have extra fluid in your knee. This causes pain and swelling and makes it hard to bend and move your knee.  Take medicines only as told by your doctor.  If you have a brace, wear the brace as told by your doctor. This information is not intended to replace advice given to you by your health care provider. Make sure you discuss any questions you have with your health care provider. Document Released: 02/23/2010 Document Revised: 02/03/2017 Document Reviewed: 02/02/2017 Elsevier Patient Education  2020 [employer].

## 2018-10-30 LAB — COMPLETE METABOLIC PANEL WITH GFR
AG Ratio: 1.6 (calc) (ref 1.0–2.5)
ALT: 12 U/L (ref 6–29)
AST: 21 U/L (ref 10–35)
Albumin: 4 g/dL (ref 3.6–5.1)
Alkaline phosphatase (APISO): 83 U/L (ref 37–153)
BUN: 19 mg/dL (ref 7–25)
CO2: 30 mmol/L (ref 20–32)
Calcium: 9.4 mg/dL (ref 8.6–10.4)
Chloride: 102 mmol/L (ref 98–110)
Creat: 0.85 mg/dL (ref 0.60–0.88)
GFR, Est African American: 74 mL/min/{1.73_m2} (ref >=60)
GFR, Est Non African American: 64 mL/min/{1.73_m2} (ref >=60)
Globulin: 2.5 g/dL (calc) (ref 1.9–3.7)
Glucose, Bld: 105 mg/dL — ABNORMAL HIGH (ref 65–99)
Potassium: 4.2 mmol/L (ref 3.5–5.3)
Sodium: 139 mmol/L (ref 135–146)
Total Bilirubin: 0.4 mg/dL (ref 0.2–1.2)
Total Protein: 6.5 g/dL (ref 6.1–8.1)

## 2018-10-30 LAB — CBC WITH DIFFERENTIAL/PLATELET
Absolute Monocytes: 592 cells/uL (ref 200–950)
Basophils Absolute: 59 cells/uL (ref 0–200)
Basophils Relative: 0.9 %
Eosinophils Absolute: 247 cells/uL (ref 15–500)
Eosinophils Relative: 3.8 %
HCT: 38 % (ref 35.0–45.0)
Hemoglobin: 12.4 g/dL (ref 11.7–15.5)
Lymphs Abs: 1521 cells/uL (ref 850–3900)
MCH: 30 pg (ref 27.0–33.0)
MCHC: 32.6 g/dL (ref 32.0–36.0)
MCV: 91.8 fL (ref 80.0–100.0)
MPV: 12 fL (ref 7.5–12.5)
Monocytes Relative: 9.1 %
Neutro Abs: 4082 cells/uL (ref 1500–7800)
Neutrophils Relative %: 62.8 %
Platelets: 256 10*3/uL (ref 140–400)
RBC: 4.14 10*6/uL (ref 3.80–5.10)
RDW: 13 % (ref 11.0–15.0)
Total Lymphocyte: 23.4 %
WBC: 6.5 10*3/uL (ref 3.8–10.8)

## 2018-10-30 LAB — LIPID PANEL
Cholesterol: 169 mg/dL (ref <200)
HDL: 66 mg/dL (ref >=50)
LDL Cholesterol (Calc): 75 mg/dL (calc)
Non-HDL Cholesterol (Calc): 103 mg/dL (calc) (ref <130)
Total CHOL/HDL Ratio: 2.6 (calc) (ref <5.0)
Triglycerides: 184 mg/dL — ABNORMAL HIGH (ref <150)

## 2018-10-30 LAB — MAGNESIUM: Magnesium: 1.9 mg/dL (ref 1.5–2.5)

## 2018-10-30 LAB — TSH: TSH: 1.16 mIU/L (ref 0.40–4.50)

## 2018-10-30 LAB — VITAMIN D 25 HYDROXY (VIT D DEFICIENCY, FRACTURES): Vit D, 25-Hydroxy: 51 ng/mL (ref 30–100)

## 2018-11-09 ENCOUNTER — Telehealth: Payer: Self-pay | Admitting: Internal Medicine

## 2018-11-09 NOTE — Telephone Encounter (Signed)
Xolair Prefilled Syringe Order: 150mg  Prefilled Syringe:  #1 75mg  Prefilled Syringe: N/A Ordered Date: 11/09/18 Expected date of arrival: 11/10/18 Ordered by: [place]: [facility]

## 2018-11-10 ENCOUNTER — Ambulatory Visit (INDEPENDENT_AMBULATORY_CARE_PROVIDER_SITE_OTHER): Payer: Medicare Other

## 2018-11-10 ENCOUNTER — Other Ambulatory Visit: Payer: Self-pay

## 2018-11-10 VITALS — Temp 95.7°F

## 2018-11-10 DIAGNOSIS — Z23 Encounter for immunization: Secondary | ICD-10-CM

## 2018-11-10 NOTE — Progress Notes (Signed)
Patient presents to the office forHDFlu Vaccine. Vaccine administered toRIGHT Deltoid withoutanycomplication. Temperature taken and recorded 

## 2018-11-10 NOTE — Telephone Encounter (Signed)
Xolair Prefilled Syringe Received:  150mg  Prefilled Syringe >> quantity # 1, lot # [id number], exp date 07/06/2019 75mg  Prefilled Syringe >> N/A Medication arrival date: 11/10/18 Received by: Hugh Kamara,LPN

## 2018-11-17 ENCOUNTER — Ambulatory Visit (INDEPENDENT_AMBULATORY_CARE_PROVIDER_SITE_OTHER): Payer: Medicare Other

## 2018-11-17 ENCOUNTER — Ambulatory Visit: Payer: Medicare Other

## 2018-11-17 ENCOUNTER — Other Ambulatory Visit: Payer: Self-pay

## 2018-11-17 DIAGNOSIS — J4540 Moderate persistent asthma, uncomplicated: Secondary | ICD-10-CM

## 2018-11-17 MED ORDER — OMALIZUMAB 150 MG/ML ~~LOC~~ SOSY
150.0000 mg | PREFILLED_SYRINGE | Freq: Once | SUBCUTANEOUS | Status: AC
  Administered 2018-11-17: 150 mg via SUBCUTANEOUS

## 2018-11-17 NOTE — Progress Notes (Signed)
Have you been hospitalized within the last 10 days?  No Do you have a fever?  No Do you have a cough?  No Do you have a headache or sore throat? No  

## 2018-12-01 ENCOUNTER — Encounter (INDEPENDENT_AMBULATORY_CARE_PROVIDER_SITE_OTHER): Payer: Self-pay

## 2018-12-07 ENCOUNTER — Telehealth: Payer: Self-pay | Admitting: Internal Medicine

## 2018-12-07 NOTE — Telephone Encounter (Signed)
Xolair Prefilled Syringe Order: 150mg  Prefilled Syringe:  #1 75mg  Prefilled Syringe: N/A Ordered Date: 12/07/2018 Expected date of arrival: 12/08/2018 Ordered by: Desmond Dike, [place]  Specialty Pharmacy: [facility]

## 2018-12-08 NOTE — Telephone Encounter (Signed)
Xolair Prefilled Syringe Received:  150mg  Prefilled Syringe >> quantity #1, lot # [id number], exp date 08/2019 75mg  Prefilled Syringe >> N/A Medication arrival date: 12/08/2018 Received by: Desmond Dike, [place]

## 2018-12-15 ENCOUNTER — Telehealth: Payer: Self-pay | Admitting: *Deleted

## 2018-12-15 NOTE — Telephone Encounter (Signed)
Patient called and reported she and her husband had a possible Covid exposure by his home health aid. The aid was last at their home on 12/11/2018.  Patient was advised she needs to wait 4 days to be tested and both of them need to take Vitamin C, Zinc and continue their Vitamin D, to increase their immunity. Patient is aware.

## 2018-12-17 ENCOUNTER — Ambulatory Visit: Payer: Medicare Other

## 2018-12-18 ENCOUNTER — Other Ambulatory Visit: Payer: Self-pay

## 2018-12-18 DIAGNOSIS — Z20822 Contact with and (suspected) exposure to covid-19: Secondary | ICD-10-CM

## 2018-12-18 DIAGNOSIS — Z20828 Contact with and (suspected) exposure to other viral communicable diseases: Secondary | ICD-10-CM | POA: Diagnosis not present

## 2018-12-21 LAB — NOVEL CORONAVIRUS, NAA: SARS-CoV-2, NAA: NOT DETECTED

## 2018-12-22 ENCOUNTER — Ambulatory Visit (INDEPENDENT_AMBULATORY_CARE_PROVIDER_SITE_OTHER): Payer: Medicare Other

## 2018-12-22 ENCOUNTER — Other Ambulatory Visit: Payer: Self-pay

## 2018-12-22 DIAGNOSIS — J4540 Moderate persistent asthma, uncomplicated: Secondary | ICD-10-CM

## 2018-12-22 MED ORDER — OMALIZUMAB 150 MG/ML ~~LOC~~ SOSY
150.0000 mg | PREFILLED_SYRINGE | Freq: Once | SUBCUTANEOUS | Status: AC
  Administered 2018-12-22: 14:00:00 150 mg via SUBCUTANEOUS

## 2018-12-22 NOTE — Progress Notes (Signed)
Have you been hospitalized within the last 10 days?  No Do you have a fever?  No Do you have a cough?  No Do you have a headache or sore throat? No  

## 2019-01-11 DIAGNOSIS — Z1231 Encounter for screening mammogram for malignant neoplasm of breast: Secondary | ICD-10-CM | POA: Diagnosis not present

## 2019-01-11 LAB — HM MAMMOGRAPHY

## 2019-01-13 ENCOUNTER — Other Ambulatory Visit: Payer: Self-pay | Admitting: *Deleted

## 2019-01-13 DIAGNOSIS — E039 Hypothyroidism, unspecified: Secondary | ICD-10-CM

## 2019-01-13 MED ORDER — LEVOTHYROXINE SODIUM 75 MCG PO TABS: ORAL_TABLET | ORAL | 2 refills | Status: DC

## 2019-01-13 MED ORDER — SIMVASTATIN 40 MG PO TABS: ORAL_TABLET | ORAL | 2 refills | Status: DC

## 2019-01-20 ENCOUNTER — Ambulatory Visit: Payer: Medicare Other | Admitting: Internal Medicine

## 2019-01-25 ENCOUNTER — Telehealth: Payer: Self-pay | Admitting: Internal Medicine

## 2019-01-25 NOTE — Telephone Encounter (Signed)
Spoke with pt. She is needing to schedule her Xolair appointment. Advised her that we would have to contact the pharmacy and order her medication. This has been done. Pt has been scheduled for 01/26/2019 at 1330.  Xolair Prefilled Syringe Order: 150mg  Prefilled Syringe:  #1 75mg  Prefilled Syringe: N/A Ordered Date: 01/25/2019 Expected date of arrival: 01/26/2019 Ordered by: Desmond Dike, [place]  Specialty Pharmacy: [facility]

## 2019-01-26 ENCOUNTER — Ambulatory Visit (INDEPENDENT_AMBULATORY_CARE_PROVIDER_SITE_OTHER): Payer: Medicare Other

## 2019-01-26 ENCOUNTER — Other Ambulatory Visit: Payer: Self-pay

## 2019-01-26 ENCOUNTER — Other Ambulatory Visit: Payer: Self-pay | Admitting: *Deleted

## 2019-01-26 DIAGNOSIS — J4540 Moderate persistent asthma, uncomplicated: Secondary | ICD-10-CM

## 2019-01-26 MED ORDER — OMALIZUMAB 150 MG/ML ~~LOC~~ SOSY
150.0000 mg | PREFILLED_SYRINGE | Freq: Once | SUBCUTANEOUS | Status: AC
  Administered 2019-01-26: 150 mg via SUBCUTANEOUS

## 2019-01-26 NOTE — Progress Notes (Signed)
All questions were answered by the patient before medication was administered. Have you been hospitalized in the last 10 days? No Do you have a fever? No Do you have a cough? No Do you have a headache or sore throat? No  

## 2019-01-26 NOTE — Telephone Encounter (Signed)
Xolair Prefilled Syringe Received:  150mg  Prefilled Syringe >> quantity #1, lot # [id number], exp date 09/2019 75mg  Prefilled Syringe >> N/A Medication arrival date: 01/26/2019 Received by: Desmond Dike, [place]

## 2019-02-03 ENCOUNTER — Encounter: Payer: Self-pay | Admitting: Internal Medicine

## 2019-02-03 NOTE — Patient Instructions (Signed)

## 2019-02-03 NOTE — Progress Notes (Signed)
History of Present Illness:      This very nice 85 y.o. MWF presents for  6  month follow up with HTN, HLD, Pre-Diabetes and Vitamin D Deficiency. Patient has COPD & Allergic Asthma and has been Xolair for years followed by [name].       Patient has hx/o  HTN (2004) & BP has been controlled at home. Today's BP: is at goal -102/64. In 2013 , she had a negative Stress Myoview.  Patient has had no complaints of any cardiac type chest pain, palpitations, dyspnea / orthopnea / PND, dizziness, claudication, or dependent edema.      Hyperlipidemia is controlled with diet & meds. Patient denies myalgias or other med SE's. Last Lipids were at goal except Trig's:  Lab Results  Component Value Date   CHOL 169 10/29/2018   HDL 66 10/29/2018   LDLCALC 75 10/29/2018   TRIG 184 (H) 10/29/2018   CHOLHDL 2.6 10/29/2018        Also, the patient has history of PreDiabetes(A1c 6.0% / 2011, then 5.7% / 2012)  and has had no symptoms of reactive hypoglycemia, diabetic polys, paresthesias or visual blurring.  Last A1c was Normal & at goal:  Lab Results  Component Value Date   HGBA1C 5.5 04/22/2018                                       Patient was dx'd Hypothyroid  Circa 1980's and has been on Thyroid Replacement since then.      Further, the patient also has history of Vitamin D Deficiency and supplements vitamin D without any suspected side-effects. Last vitamin D was near goal (70-100):  Lab Results  Component Value Date   VD25OH 51 10/29/2018    Current Outpatient Medications on File Prior to Visit  Medication Sig  . Ascorbic Acid (VITAMIN C PO) Take 500 mg by mouth daily.  . budesonide (PULMICORT) 0.25 MG/2ML nebulizer solution Take 2 mLs (0.25 mg total) by nebulization daily. Once daily and will take again if needed  . Cholecalciferol (VITAMIN D PO) Take 5,000 Units by mouth daily.  [name] EPINEPHrine (EPIPEN JR) 0.15 MG/0.3ML injection Inject 0.15 mg into the muscle as needed.    .  formoterol (PERFOROMIST) 20 MCG/2ML nebulizer solution Take 2 mLs (20 mcg total) by nebulization 2 (two) times daily.  . Ipratropium-Albuterol (COMBIVENT RESPIMAT) 20-100 MCG/ACT AERS respimat USE 1 INHALATION EVERY 6 HOURS AS NEEDED FOR WHEEZING  . levothyroxine (SYNTHROID) 75 MCG tablet TAKE 1 TABLET DAILY  . loratadine (CLARITIN) 10 MG tablet Take 1 tablet (10 mg total) by mouth daily as needed for allergies.  [name] meclizine (ANTIVERT) 25 MG tablet Take 1/2 to 1 tablet 3 x /day as needed for dizziness / Vertigo  . simvastatin (ZOCOR) 40 MG tablet Take 1 tablet daily for cholesterol.  . zinc gluconate 50 MG tablet Take 50 mg by mouth daily.   Current Facility-Administered Medications on File Prior to Visit  Medication  . omalizumab Arvid Right) injection 150 mg    Allergies  Allergen Reactions  . Codeine Nausea Only  . Propoxyphene N-Acetaminophen Nausea Only  . Shellfish-Derived Products Rash    PMHx:   Past Medical History:  Diagnosis Date  . Asthma   . COPD (chronic obstructive pulmonary disease) ([place])   . Depression   . Dizziness   . Hyperlipemia   .  Hypertension   . Hypothyroidism   . Prediabetes   . SVD (spontaneous vaginal delivery)    x 2  . Vitamin D deficiency    Immunization History  Administered Date(s) Administered  . Influenza Whole 10/05/2009, 11/26/2010, 11/05/2011  . Influenza, High Dose Seasonal PF 11/20/2012, 11/22/2013, 11/21/2014, 11/10/2015, 11/19/2016, 11/20/2017, 11/10/2018  . Pneumococcal Conjugate-13 11/22/2013  . Pneumococcal Polysaccharide-23 09/14/2008  . Td 10/09/2004, 03/12/2017  . Zoster 02/15/2013   Past Surgical History:  Procedure Laterality Date  . DILATATION & CURRETTAGE/HYSTEROSCOPY WITH RESECTOCOPE N/A 09/10/2013   Procedure: DILATATION & CURETTAGE/HYSTEROSCOPY WITH RESECTOCOPE;  Surgeon: Darlyn Chamber, MD;  Location: [facility];  Service: Gynecology;  Laterality: N/A;  . TONSILLECTOMY    . TUBAL LIGATION Bilateral 1970  . WISDOM TOOTH  EXTRACTION      FHx:    Reviewed / unchanged  SHx:    Reviewed / unchanged   Systems Review:  Constitutional: Denies fever, chills, wt changes, headaches, insomnia, fatigue, night sweats, change in appetite. Eyes: Denies redness, blurred vision, diplopia, discharge, itchy, watery eyes.  ENT: Denies discharge, congestion, post nasal drip, epistaxis, sore throat, earache, hearing loss, dental pain, tinnitus, vertigo, sinus pain, snoring.  CV: Denies chest pain, palpitations, irregular heartbeat, syncope, dyspnea, diaphoresis, orthopnea, PND, claudication or edema. Respiratory: denies cough, dyspnea, DOE, pleurisy, hoarseness, laryngitis, wheezing.  Gastrointestinal: Denies dysphagia, odynophagia, heartburn, reflux, water brash, abdominal pain or cramps, nausea, vomiting, bloating, diarrhea, constipation, hematemesis, melena, hematochezia  or hemorrhoids. Genitourinary: Denies dysuria, frequency, urgency, nocturia, hesitancy, discharge, hematuria or flank pain. Musculoskeletal: Denies arthralgias, myalgias, stiffness, jt. swelling, pain, limping or strain/sprain.  Skin: Denies pruritus, rash, hives, warts, acne, eczema or change in skin lesion(s). Neuro: No weakness, tremor, incoordination, spasms, paresthesia or pain. Psychiatric: Denies confusion, memory loss or sensory loss. Endo: Denies change in weight, skin or hair change.  Heme/Lymph: No excessive bleeding, bruising or enlarged lymph nodes.  Physical Exam  BP 102/64   Pulse 68   Temp (!) 97.2 F (36.2 C)   Resp 16   Ht 4' 11.5" (1.511 m)   Wt 124 lb 6.4 oz (56.4 kg)   BMI 24.71 kg/m   Appears  well nourished, well groomed  and in no distress.  Eyes: PERRLA, EOMs, conjunctiva no swelling or erythema. Sinuses: No frontal/maxillary tenderness ENT/Mouth: EAC's clear, TM's nl w/o erythema, bulging. Nares clear w/o erythema, swelling, exudates. Oropharynx clear without erythema or exudates. Oral hygiene is good. Tongue normal,  non obstructing. Hearing intact.  Neck: Supple. Thyroid not palpable. Car 2+/2+ without bruits, nodes or JVD. Chest: Respirations nl with BS clear & equal w/o rales, rhonchi, wheezing or stridor.  Cor: Heart sounds normal w/ regular rate and rhythm without sig. murmurs, gallops, clicks or rubs. Peripheral pulses normal and equal  without edema.  Abdomen: Soft & bowel sounds normal. Non-tender w/o guarding, rebound, hernias, masses or organomegaly.  Lymphatics: Unremarkable.  Musculoskeletal: Full ROM all peripheral extremities, joint stability, 5/5 strength and normal gait.  Skin: Warm, dry without exposed rashes, lesions or ecchymosis apparent.  Neuro: Cranial nerves intact, reflexes equal bilaterally. Sensory-motor testing grossly intact. Tendon reflexes grossly intact.  Pysch: Alert & oriented x 3.  Insight and judgement nl & appropriate. No ideations.  Assessment and Plan:  1. Essential hypertension  - Continue medication, monitor blood pressure at home.  - Continue DASH diet.  Reminder to go to the ER if any CP,  SOB, nausea, dizziness, severe HA, changes vision/speech.  - CBC with Diff - COMPLETE  METABOLIC PANEL WITH GFR - Magnesium - TSH  2. Hyperlipidemia, mixed  - Continue diet/meds, exercise,& lifestyle modifications.  - Continue monitor periodic cholesterol/liver & renal functions   - Lipid Profile - TSH  3. Abnormal glucose  - Hemoglobin A1c ([facility]) - Insulin, random  4. Vitamin D deficiency  - Vitamin D (25 hydroxy)  5. Hypothyroidism  - TSH  6. Moderate persistent extrinsic asthma   7. Medication management  - CBC with Diff - COMPLETE METABOLIC PANEL WITH GFR - Magnesium - Lipid Profile - TSH - Hemoglobin A1c ([facility]) - Insulin, random - Vitamin D (25 hydroxy)  1. Essential hypertension  - CBC with Diff - COMPLETE METABOLIC PANEL WITH GFR - Magnesium - TSH  2. Hyperlipidemia, mixed  - Lipid Profile - TSH  3. Abnormal glucose  -  Continue diet, exercise  - Lifestyle modifications.  - Monitor appropriate labs.  - Hemoglobin A1c ([facility]) - Insulin, random  4. Vitamin D deficiency  - Continue supplementation.  - Vitamin D (25 hydroxy)  5. Hypothyroidism, unspecified type  - TSH  6. Moderate persistent extrinsic asthma without complication   7. Medication management  - CBC with Diff - COMPLETE METABOLIC PANEL WITH GFR - Magnesium - Lipid Profile - TSH - Hemoglobin A1c ([facility]) - Insulin, random - Vitamin D (25 hydroxy)        Discussed  regular exercise, BP monitoring, weight control to achieve/maintain BMI less than 25 and discussed med and SE's. Recommended labs to assess and monitor clinical status with further disposition pending results of labs.  I discussed the assessment and treatment plan with the patient. The patient was provided an opportunity to ask questions and all were answered. The patient agreed with the plan and demonstrated an understanding of the instructions.  I provided over 30 minutes of exam, counseling, chart review and  complex critical decision making.  Kirtland Bouchard, MD

## 2019-02-04 ENCOUNTER — Ambulatory Visit (INDEPENDENT_AMBULATORY_CARE_PROVIDER_SITE_OTHER): Payer: Medicare Other | Admitting: Internal Medicine

## 2019-02-04 ENCOUNTER — Other Ambulatory Visit: Payer: Self-pay

## 2019-02-04 ENCOUNTER — Other Ambulatory Visit: Payer: Self-pay | Admitting: *Deleted

## 2019-02-04 ENCOUNTER — Other Ambulatory Visit: Payer: Self-pay | Admitting: Internal Medicine

## 2019-02-04 VITALS — BP 102/64 | HR 68 | Temp 97.2°F | Resp 16 | Ht 59.5 in | Wt 124.4 lb

## 2019-02-04 DIAGNOSIS — I10 Essential (primary) hypertension: Secondary | ICD-10-CM

## 2019-02-04 DIAGNOSIS — R7309 Other abnormal glucose: Secondary | ICD-10-CM

## 2019-02-04 DIAGNOSIS — E782 Mixed hyperlipidemia: Secondary | ICD-10-CM | POA: Diagnosis not present

## 2019-02-04 DIAGNOSIS — J4540 Moderate persistent asthma, uncomplicated: Secondary | ICD-10-CM

## 2019-02-04 DIAGNOSIS — E559 Vitamin D deficiency, unspecified: Secondary | ICD-10-CM | POA: Diagnosis not present

## 2019-02-04 DIAGNOSIS — E039 Hypothyroidism, unspecified: Secondary | ICD-10-CM

## 2019-02-04 DIAGNOSIS — Z79899 Other long term (current) drug therapy: Secondary | ICD-10-CM

## 2019-02-04 DIAGNOSIS — J4530 Mild persistent asthma, uncomplicated: Secondary | ICD-10-CM

## 2019-02-04 MED ORDER — SERTRALINE HCL 50 MG PO TABS: ORAL_TABLET | ORAL | 2 refills | Status: DC

## 2019-02-05 ENCOUNTER — Other Ambulatory Visit: Payer: Self-pay | Admitting: Internal Medicine

## 2019-02-05 DIAGNOSIS — E039 Hypothyroidism, unspecified: Secondary | ICD-10-CM

## 2019-02-05 MED ORDER — LEVOTHYROXINE SODIUM 75 MCG PO TABS: ORAL_TABLET | ORAL | 2 refills | Status: DC

## 2019-02-08 LAB — COMPLETE METABOLIC PANEL WITH GFR
AG Ratio: 1.4 (calc) (ref 1.0–2.5)
ALT: 11 U/L (ref 6–29)
AST: 19 U/L (ref 10–35)
Albumin: 4.1 g/dL (ref 3.6–5.1)
Alkaline phosphatase (APISO): 77 U/L (ref 37–153)
BUN/Creatinine Ratio: 15 (calc) (ref 6–22)
BUN: 14 mg/dL (ref 7–25)
CO2: 32 mmol/L (ref 20–32)
Calcium: 9.9 mg/dL (ref 8.6–10.4)
Chloride: 100 mmol/L (ref 98–110)
Creat: 0.95 mg/dL — ABNORMAL HIGH (ref 0.60–0.88)
GFR, Est African American: 64 mL/min/{1.73_m2} (ref >=60)
GFR, Est Non African American: 55 mL/min/{1.73_m2} — ABNORMAL LOW (ref >=60)
Globulin: 2.9 g/dL (calc) (ref 1.9–3.7)
Glucose, Bld: 105 mg/dL — ABNORMAL HIGH (ref 65–99)
Potassium: 4.6 mmol/L (ref 3.5–5.3)
Sodium: 139 mmol/L (ref 135–146)
Total Bilirubin: 0.6 mg/dL (ref 0.2–1.2)
Total Protein: 7 g/dL (ref 6.1–8.1)

## 2019-02-08 LAB — LIPID PANEL
Cholesterol: 182 mg/dL (ref <200)
HDL: 70 mg/dL (ref >=50)
LDL Cholesterol (Calc): 96 mg/dL (calc)
Non-HDL Cholesterol (Calc): 112 mg/dL (calc) (ref <130)
Total CHOL/HDL Ratio: 2.6 (calc) (ref <5.0)
Triglycerides: 69 mg/dL (ref <150)

## 2019-02-08 LAB — INSULIN, RANDOM: Insulin: 15.6 u[IU]/mL

## 2019-02-08 LAB — CBC WITH DIFFERENTIAL/PLATELET
Absolute Monocytes: 431 cells/uL (ref 200–950)
Basophils Absolute: 49 cells/uL (ref 0–200)
Basophils Relative: 1 %
Eosinophils Absolute: 88 cells/uL (ref 15–500)
Eosinophils Relative: 1.8 %
HCT: 38.7 % (ref 35.0–45.0)
Hemoglobin: 12.6 g/dL (ref 11.7–15.5)
Lymphs Abs: 1073 cells/uL (ref 850–3900)
MCH: 30 pg (ref 27.0–33.0)
MCHC: 32.6 g/dL (ref 32.0–36.0)
MCV: 92.1 fL (ref 80.0–100.0)
MPV: 11.3 fL (ref 7.5–12.5)
Monocytes Relative: 8.8 %
Neutro Abs: 3259 cells/uL (ref 1500–7800)
Neutrophils Relative %: 66.5 %
Platelets: 242 10*3/uL (ref 140–400)
RBC: 4.2 10*6/uL (ref 3.80–5.10)
RDW: 13 % (ref 11.0–15.0)
Total Lymphocyte: 21.9 %
WBC: 4.9 10*3/uL (ref 3.8–10.8)

## 2019-02-08 LAB — HEMOGLOBIN A1C
Hgb A1c MFr Bld: 5.4 % of total Hgb (ref <5.7)
Mean Plasma Glucose: 108 (calc)
eAG (mmol/L): 6 (calc)

## 2019-02-08 LAB — MAGNESIUM: Magnesium: 2.1 mg/dL (ref 1.5–2.5)

## 2019-02-08 LAB — TSH: TSH: 4.99 mIU/L — ABNORMAL HIGH (ref 0.40–4.50)

## 2019-02-08 LAB — VITAMIN D 25 HYDROXY (VIT D DEFICIENCY, FRACTURES): Vit D, 25-Hydroxy: 56 ng/mL (ref 30–100)

## 2019-03-02 ENCOUNTER — Telehealth: Payer: Self-pay | Admitting: Internal Medicine

## 2019-03-02 NOTE — Telephone Encounter (Signed)
Xolair Prefilled Syringe Order: 150mg  Prefilled Syringe:  #1 75mg  Prefilled Syringe: N/A Ordered Date: 03/02/2019 Expected date of arrival: 03/03/2019 Ordered by: Desmond Dike, [place]: Jessica Priest with pt. She has been scheduled for her appointment on 03/03/2019 at 1430.

## 2019-03-03 ENCOUNTER — Telehealth: Payer: Self-pay | Admitting: Internal Medicine

## 2019-03-03 ENCOUNTER — Other Ambulatory Visit: Payer: Self-pay

## 2019-03-03 ENCOUNTER — Ambulatory Visit (INDEPENDENT_AMBULATORY_CARE_PROVIDER_SITE_OTHER): Payer: Medicare Other

## 2019-03-03 DIAGNOSIS — J4540 Moderate persistent asthma, uncomplicated: Secondary | ICD-10-CM | POA: Diagnosis not present

## 2019-03-03 MED ORDER — OMALIZUMAB 150 MG/ML ~~LOC~~ SOSY
150.0000 mg | PREFILLED_SYRINGE | Freq: Once | SUBCUTANEOUS | Status: AC
  Administered 2019-03-03: 150 mg via SUBCUTANEOUS

## 2019-03-03 NOTE — Progress Notes (Signed)
All questions were answered by the patient before medication was administered. Have you been hospitalized in the last 10 days? No Do you have a fever? No Do you have a cough? No Do you have a headache or sore throat? No  

## 2019-03-03 NOTE — Telephone Encounter (Signed)
Spoke with pt. She is aware that her medication did come in this morning. Nothing further was needed.

## 2019-03-03 NOTE — Telephone Encounter (Signed)
Xolair Prefilled Syringe Received:  150mg  Prefilled Syringe >> quantity #1, lot # [id number], exp date 10/2019 75mg  Prefilled Syringe >> N/A Medication arrival date: 03/03/2019 Received by: Desmond Dike, [place]

## 2019-03-11 ENCOUNTER — Ambulatory Visit: Payer: Medicare Other

## 2019-03-29 ENCOUNTER — Telehealth: Payer: Self-pay | Admitting: Internal Medicine

## 2019-03-29 NOTE — Telephone Encounter (Signed)
Xolair Prefilled Syringe Order: 150mg  Prefilled Syringe:  #1 75mg  Prefilled Syringe: N/A Ordered Date: 03/29/2019 Expected date of arrival: 03/30/2019 Ordered by: Desmond Dike, [place]  Specialty Pharmacy: [facility]

## 2019-03-30 NOTE — Telephone Encounter (Signed)
Xolair Prefilled Syringe Received:  150mg  Prefilled Syringe >> quantity #1, lot # [id number], exp date 11/2019 75mg  Prefilled Syringe >> N/A Medication arrival date: 03/30/2019 Received by: Desmond Dike, [place]

## 2019-04-04 ENCOUNTER — Other Ambulatory Visit: Payer: Self-pay

## 2019-04-04 ENCOUNTER — Ambulatory Visit: Payer: Medicare Other | Attending: Internal Medicine

## 2019-04-04 DIAGNOSIS — Z23 Encounter for immunization: Secondary | ICD-10-CM | POA: Insufficient documentation

## 2019-04-04 NOTE — Progress Notes (Signed)
   Covid-19 Vaccination Clinic  Name:  Kathy Whitaker    MRN: [id number] DOB: 03/28/1933  04/04/2019  Ms. [name] was observed post Covid-19 immunization for 30 minutes based on pre-vaccination screening without incidence. She was provided with Vaccine Information Sheet and instruction to access the V-Safe system.   Ms. [name] was instructed to call 911 with any severe reactions post vaccine: [name] Difficulty breathing  . Swelling of your face and throat  . A fast heartbeat  . A bad rash all over your body  . Dizziness and weakness    Immunizations Administered    Name Date Dose VIS Date Route   Pfizer COVID-19 Vaccine 04/04/2019 10:38 AM 0.3 mL 01/15/2019 Intramuscular   Manufacturer: [place]   Lot: [id number]   [place]: [id number]

## 2019-04-08 ENCOUNTER — Ambulatory Visit (INDEPENDENT_AMBULATORY_CARE_PROVIDER_SITE_OTHER): Payer: Medicare Other

## 2019-04-08 ENCOUNTER — Other Ambulatory Visit: Payer: Self-pay

## 2019-04-08 DIAGNOSIS — J4540 Moderate persistent asthma, uncomplicated: Secondary | ICD-10-CM

## 2019-04-08 MED ORDER — OMALIZUMAB 150 MG/ML ~~LOC~~ SOSY
150.0000 mg | PREFILLED_SYRINGE | Freq: Once | SUBCUTANEOUS | Status: AC
  Administered 2019-04-08: 150 mg via SUBCUTANEOUS

## 2019-04-08 NOTE — Progress Notes (Signed)
All questions were answered by the patient before medication was administered. Have you been hospitalized in the last 10 days? No Do you have a fever? No Do you have a cough? No Do you have a headache or sore throat? No  

## 2019-04-15 ENCOUNTER — Other Ambulatory Visit: Payer: Self-pay | Admitting: Internal Medicine

## 2019-04-15 DIAGNOSIS — J4530 Mild persistent asthma, uncomplicated: Secondary | ICD-10-CM

## 2019-04-19 ENCOUNTER — Other Ambulatory Visit: Payer: Self-pay | Admitting: *Deleted

## 2019-04-19 DIAGNOSIS — E039 Hypothyroidism, unspecified: Secondary | ICD-10-CM

## 2019-04-19 MED ORDER — LEVOTHYROXINE SODIUM 75 MCG PO TABS: ORAL_TABLET | ORAL | 2 refills | Status: DC

## 2019-04-20 ENCOUNTER — Telehealth: Payer: Self-pay | Admitting: Internal Medicine

## 2019-04-20 DIAGNOSIS — J4530 Mild persistent asthma, uncomplicated: Secondary | ICD-10-CM

## 2019-04-20 NOTE — Telephone Encounter (Signed)
Called and spoke with Patient.  Patient stated [facility] had been sending fax for pulmicort, and perforomist refills, with no response. Patient stated she is in desperate need of her neb meds. Patient LOV 11/18/2017, with Dr. [name]. Patient was offered OV with NP, but refused.  Patient stated she only wants to see Dr. [name]. Patient scheduled 05/18/19, at 1045, for asthma follow up. Patient is requesting refills to be sent to [facility] pharmacy.  Dr. [name], please advise if you are ok with refilling pulmicort and perforomist   LOV instructions from 11/18/17, MW -  Instructions  Combine your nebulizer solutions and take each am plus second treatment in pm if needed.  Work on inhaler technique:  relax and gently blow all the way out then take a nice smooth deep breath back in, triggering the inhaler at same time you start breathing in.  Hold for up to 5 seconds if you can. Blow out thru nose. Rinse and gargle with water when done  Only use your a combivent as a rescue medication to be used if you can't catch your breath by resting or doing a relaxed purse lip breathing pattern.  - The less you use it, the better it will work when you need it. - Ok to use up to 1 puffs  every 4 hours if you must but call for immediate appointment if use goes up over your usual need - Don't leave home without it !!  (think of it like the spare tire for your car)   Please schedule a follow up visit in 12 months but call sooner if needed

## 2019-04-21 NOTE — Telephone Encounter (Signed)
That is fine, just be sure she keeps her appointment and give 3 months worth only as always possible it will need to be changed once we regroup in office

## 2019-04-21 NOTE — Telephone Encounter (Signed)
Called patient but she did not answer. Left message for her to call back.  

## 2019-04-22 MED ORDER — BUDESONIDE 0.25 MG/2ML IN SUSP: 0.2500 mg | Freq: Every day | RESPIRATORY_TRACT | 2 refills | Status: DC

## 2019-04-22 MED ORDER — FORMOTEROL FUMARATE 20 MCG/2ML IN NEBU: 20.0000 ug | INHALATION_SOLUTION | Freq: Two times a day (BID) | RESPIRATORY_TRACT | 2 refills | Status: DC

## 2019-04-22 NOTE — Telephone Encounter (Addendum)
Called and spoke with pt letting her know that MW said it was fine to have her neb meds refilled and pt verbalized understanding. Sent rx for both perforomist and pulmicort to [facility] for pt. Nothing further needed.

## 2019-04-22 NOTE — Progress Notes (Signed)
Subjective:    Patient ID: Kathy Whitaker, female    DOB: May 06, 1933, 86 y.o.   MRN: [id number]  HPI 86 y.o. WF presents s/p fall 1 month ago. She was on her stair lift taking soup to her husband, stopped her stair lift and went down the stairs but tripped on part of the stair lift. [place], bilateral and landed on her knees.  No LOC, did not hit her head.  She states she continues to have a hematoma left anterior lateral leg.   Blood pressure 126/72, pulse 74, temperature (!) 97.3 F (36.3 C), weight 122 lb (55.3 kg), SpO2 97 %.  Medications  Current Outpatient Medications (Endocrine & Metabolic):  .  levothyroxine (SYNTHROID) 75 MCG tablet, Take 1 tablet daily on an empty stomach with only water for 30 minutes & no Antacid meds, Calcium or Magnesium for 4 hours & avoid Biotin   Current Outpatient Medications (Cardiovascular):  [name]  EPINEPHrine (EPIPEN JR) 0.15 MG/0.3ML injection, Inject 0.15 mg into the muscle as needed.   .  simvastatin (ZOCOR) 40 MG tablet, Take 1 tablet daily for cholesterol.   Current Outpatient Medications (Respiratory):  .  budesonide (PULMICORT) 0.25 MG/2ML nebulizer solution, Take 2 mLs (0.25 mg total) by nebulization daily. Once daily and will take again if needed .  formoterol (PERFOROMIST) 20 MCG/2ML nebulizer solution, Take 2 mLs (20 mcg total) by nebulization 2 (two) times daily. .  Ipratropium-Albuterol (COMBIVENT RESPIMAT) 20-100 MCG/ACT AERS respimat, USE 1 INHALATION EVERY 6 HOURS AS NEEDED FOR WHEEZING .  loratadine (CLARITIN) 10 MG tablet, Take 1 tablet (10 mg total) by mouth daily as needed for allergies.  Current Facility-Administered Medications (Respiratory):  .  omalizumab Arvid Right) injection 150 mg .  omalizumab Arvid Right) injection 150 mg      Current Outpatient Medications (Other):  [name]  Ascorbic Acid (VITAMIN C PO), Take 500 mg by mouth daily. .  Cholecalciferol (VITAMIN D PO), Take 5,000 Units by mouth daily. .  meclizine (ANTIVERT) 25 MG  tablet, Take 1/2 to 1 tablet 3 x /day as needed for dizziness / Vertigo .  sertraline (ZOLOFT) 50 MG tablet, Take 1 tablet daily for mood. .  zinc gluconate 50 MG tablet, Take 50 mg by mouth daily.   Problem list She has Extrinsic asthma; GERD; Other abnormal glucose; Vitamin D deficiency; Hypothyroidism; Hyperlipemia; Essential hypertension; Medication management; BMI 24.0-24.9, adult; Encounter for Medicare annual wellness exam; Vertigo; Hearing loss; and Depression, major, recurrent, in partial remission ([place]) on their problem list.  Review of Systems     Objective:   Physical Exam HENT:     Head: Normocephalic and atraumatic.  Neck:     Comments: Negative C7 tenderness Cardiovascular:     Rate and Rhythm: Normal rate.  Pulmonary:     Effort: Pulmonary effort is normal.     Breath sounds: Normal breath sounds.  Abdominal:     General: Bowel sounds are normal. There is no distension.     Palpations: Abdomen is soft.     Tenderness: There is no abdominal tenderness.  Musculoskeletal:        General: No swelling, tenderness or deformity. Normal range of motion.     Cervical back: Normal range of motion and neck supple.     Right lower leg: No edema.     Left lower leg: No edema.  Skin:    General: Skin is warm and dry.     Comments: 5 x 3 cm hematoma left anterior medial leg,  no warmth, redness, negative homen's, no distal swelling.   Neurological:     Mental Status: She is alert and oriented to person, place, and time.     Cranial Nerves: No cranial nerve deficit.     Sensory: No sensory deficit.     Motor: No abnormal muscle tone.     Deep Tendon Reflexes: Reflexes normal.        Assessment & Plan:  Keyani was seen today for acute visit.  Diagnoses and all orders for this visit:  Hematoma Patient educated about hematoma and the course that it will take No evidence of DVT, signs or symptoms discussed with patient to follow up or go to the ER Fall prevention  discussed.

## 2019-04-22 NOTE — Telephone Encounter (Signed)
Pt returning call.  [phone].  Pt is completely out of medication.  Please call ASAP.

## 2019-04-23 ENCOUNTER — Other Ambulatory Visit: Payer: Self-pay

## 2019-04-23 ENCOUNTER — Encounter: Payer: Self-pay | Admitting: Physician Assistant

## 2019-04-23 ENCOUNTER — Ambulatory Visit (INDEPENDENT_AMBULATORY_CARE_PROVIDER_SITE_OTHER): Payer: Medicare Other | Admitting: Physician Assistant

## 2019-04-23 VITALS — BP 126/72 | HR 74 | Temp 97.3°F | Wt 122.0 lb

## 2019-04-23 DIAGNOSIS — T148XXA Other injury of unspecified body region, initial encounter: Secondary | ICD-10-CM

## 2019-04-23 NOTE — Patient Instructions (Signed)
Hematoma A hematoma is a collection of blood under the skin, in an organ, in a body space, in a joint space, or in other tissue. The blood can thicken (clot) to form a lump that you can see and feel. The lump is often firm and may become sore and tender. Most hematomas get better in a few days to weeks. However, some hematomas may be serious and require medical care. Hematomas can range from very small to very large. What are the causes? This condition is caused by:  A blunt or penetrating injury.  A leakage from a blood vessel under the skin.  Some medical procedures, including surgeries, such as oral surgery, face lifts, and surgeries on the joints.  Some medical conditions that cause bleeding or bruising. There may be multiple hematomas that appear in different areas of the body. What increases the risk? You are more likely to develop this condition if:  You are an older adult.  You use blood thinners. What are the signs or symptoms?  Symptoms of this condition depend on where the hematoma is located.  Common symptoms of a hematoma that is under the skin include:  A firm lump on the body.  Pain and tenderness in the area.  Bruising. Blue, dark blue, purple-red, or yellowish skin (discoloration) may appear at the site of the hematoma if the hematoma is close to the surface of the skin. Common symptoms of a hematoma that is deep in the tissues or body spaces may be less obvious. They include:  A collection of blood in the stomach (intra-abdominal hematoma). This may cause pain in the abdomen, weakness, fainting, and shortness of breath.  A collection of blood in the head (intracranial hematoma). This may cause a headache or symptoms such as weakness, trouble speaking or understanding, or a change in consciousness. How is this diagnosed? This condition is diagnosed based on:  Your medical history.  A physical exam.  Imaging tests, such as an ultrasound or CT scan. These may  be needed if your health care provider suspects a hematoma in deeper tissues or body spaces.  Blood tests. These may be needed if your health care provider believes that the hematoma is caused by a medical condition. How is this treated? Treatment for this condition depends on the cause, size, and location of the hematoma. Treatment may include:  Doing nothing. The majority of hematomas do not need treatment as many of them go away on their own over time.  Surgery or close monitoring. This may be needed for large hematomas or hematomas that affect vital organs.  Medicines. Medicines may be given if there is an underlying medical cause for the hematoma. Follow these instructions at home: Managing pain, stiffness, and swelling   If directed, put ice on the affected area. ? Put ice in a plastic bag. ? Place a towel between your skin and the bag. ? Leave the ice on for 20 minutes, 2-3 times a day for the first couple of days.  If directed, apply heat to the affected area after applying ice for a couple of days. Use the heat source that your health care provider recommends, such as a moist heat pack or a heating pad. ? Place a towel between your skin and the heat source. ? Leave the heat on for 20-30 minutes. ? Remove the heat if your skin turns bright red. This is especially important if you are unable to feel pain, heat, or cold. You may have a greater   risk of getting burned.  Raise (elevate) the affected area above the level of your heart while you are sitting or lying down.  If told, wrap the affected area with an elastic bandage. The bandage applies pressure (compression) to the area, which may help to reduce swelling and promote healing. Do not wrap the bandage too tightly around the affected area.  If your hematoma is on a leg or foot (lower extremity) and is painful, your health care provider may recommend crutches. Use them as told by your health care provider. General  instructions  Take over-the-counter and prescription medicines only as told by your health care provider.  Keep all follow-up visits as told by your health care provider. This is important. Contact a health care provider if:  You have a fever.  The swelling or discoloration gets worse.  You develop more hematomas. Get help right away if:  Your pain is worse or your pain is not controlled with medicine.  Your skin over the hematoma breaks or starts bleeding.  Your hematoma is in your chest or abdomen and you have weakness, shortness of breath, or a change in consciousness.  You have a hematoma on your scalp that is caused by a fall or injury, and you also have: ? A headache that gets worse. ? Trouble speaking or understanding speech. ? Weakness. ? Change in alertness or consciousness. Summary  A hematoma is a collection of blood under the skin, in an organ, in a body space, in a joint space, or in other tissue.  This condition usually does not need treatment because many hematomas go away on their own over time.  Large hematomas, or those that may affect vital organs, may need surgical drainage or monitoring. If the hematoma is caused by a medical condition, medicines may be prescribed.  Get help right away if your hematoma breaks or starts to bleed, you have shortness of breath, or you have a headache or trouble speaking after a fall. This information is not intended to replace advice given to you by your health care provider. Make sure you discuss any questions you have with your health care provider. Document Revised: 06/17/2018 Document Reviewed: 06/26/2017 Elsevier Patient Education  2020 Elsevier Inc.  

## 2019-04-26 ENCOUNTER — Other Ambulatory Visit: Payer: Self-pay | Admitting: *Deleted

## 2019-04-26 DIAGNOSIS — E039 Hypothyroidism, unspecified: Secondary | ICD-10-CM

## 2019-04-26 MED ORDER — LEVOTHYROXINE SODIUM 75 MCG PO TABS: ORAL_TABLET | ORAL | 0 refills | Status: DC

## 2019-04-28 ENCOUNTER — Other Ambulatory Visit: Payer: Self-pay | Admitting: *Deleted

## 2019-04-28 ENCOUNTER — Ambulatory Visit: Payer: Medicare Other | Attending: Internal Medicine

## 2019-04-28 DIAGNOSIS — E039 Hypothyroidism, unspecified: Secondary | ICD-10-CM

## 2019-04-28 DIAGNOSIS — Z23 Encounter for immunization: Secondary | ICD-10-CM

## 2019-04-28 MED ORDER — LEVOTHYROXINE SODIUM 75 MCG PO TABS: ORAL_TABLET | ORAL | 0 refills | Status: DC

## 2019-04-28 NOTE — Progress Notes (Signed)
   Covid-19 Vaccination Clinic  Name:  Kathy Whitaker    MRN: [id number] DOB: 1933-11-24  04/28/2019  Kathy Whitaker was observed post Covid-19 immunization for 15 minutes without incident. She was provided with Vaccine Information Sheet and instruction to access the V-Safe system.   Kathy Whitaker was instructed to call 911 with any severe reactions post vaccine: [name] Difficulty breathing  . Swelling of face and throat  . A fast heartbeat  . A bad rash all over body  . Dizziness and weakness   Immunizations Administered    Name Date Dose VIS Date Route   Pfizer COVID-19 Vaccine 04/28/2019  2:05 PM 0.3 mL 01/15/2019 Intramuscular   Manufacturer: [place]   Lot: [id number]   [place]: [id number]

## 2019-05-03 ENCOUNTER — Telehealth: Payer: Self-pay | Admitting: Internal Medicine

## 2019-05-03 NOTE — Telephone Encounter (Signed)
Xolair Prefilled Syringe Order: 150mg  Prefilled Syringe:  #1 75mg  Prefilled Syringe: #0 Ordered Date: 05/03/2019 Expected date of arrival: 05/04/2019 Ordered by: Desmond Dike, [place]  Specialty [facility]: [facility]

## 2019-05-04 NOTE — Telephone Encounter (Signed)
Xolair Prefilled Syringe Received:  150mg  Prefilled Syringe >> quantity #1, lot # [id number], exp date 12/2019 75mg  Prefilled Syringe >> N/A Medication arrival date: 05/04/2019 Received by: Desmond Dike, [place]

## 2019-05-06 DIAGNOSIS — H25813 Combined forms of age-related cataract, bilateral: Secondary | ICD-10-CM | POA: Diagnosis not present

## 2019-05-10 ENCOUNTER — Ambulatory Visit: Payer: Medicare Other

## 2019-05-11 ENCOUNTER — Other Ambulatory Visit: Payer: Self-pay

## 2019-05-11 ENCOUNTER — Ambulatory Visit (INDEPENDENT_AMBULATORY_CARE_PROVIDER_SITE_OTHER): Payer: Medicare Other

## 2019-05-11 DIAGNOSIS — J4530 Mild persistent asthma, uncomplicated: Secondary | ICD-10-CM | POA: Diagnosis not present

## 2019-05-11 MED ORDER — OMALIZUMAB 150 MG/ML ~~LOC~~ SOSY
150.0000 mg | PREFILLED_SYRINGE | Freq: Once | SUBCUTANEOUS | Status: AC
  Administered 2019-05-11: 150 mg via SUBCUTANEOUS

## 2019-05-11 NOTE — Progress Notes (Signed)
All questions were answered by the patient before medication was administered. Have you been hospitalized in the last 10 days? No Do you have a fever? No Do you have a cough? No Do you have a headache or sore throat? No  

## 2019-05-18 ENCOUNTER — Ambulatory Visit: Payer: Medicare Other | Admitting: Internal Medicine

## 2019-05-25 ENCOUNTER — Ambulatory Visit: Payer: Medicare Other | Admitting: Adult Health Nurse Practitioner

## 2019-05-31 ENCOUNTER — Telehealth: Payer: Self-pay | Admitting: Internal Medicine

## 2019-05-31 NOTE — Telephone Encounter (Signed)
Error

## 2019-06-04 ENCOUNTER — Other Ambulatory Visit: Payer: Self-pay

## 2019-06-04 ENCOUNTER — Encounter: Payer: Self-pay | Admitting: Internal Medicine

## 2019-06-04 ENCOUNTER — Ambulatory Visit (INDEPENDENT_AMBULATORY_CARE_PROVIDER_SITE_OTHER): Payer: Medicare Other | Admitting: Internal Medicine

## 2019-06-04 DIAGNOSIS — J4540 Moderate persistent asthma, uncomplicated: Secondary | ICD-10-CM

## 2019-06-04 NOTE — Progress Notes (Signed)
Subjective:     Patient ID: Kathy Whitaker, female   DOB: [birth year]   MRN: [id number]    Brief patient profile:  42  yowf never smoker with h/o allergic rhinitis/ moderate chronic asthma dating back to  Her late 31s and much better on xolair/performist/budesonide previously under Dr Bettina Gavia care  - on [place] since around 2009 / reduced to monthly rx 04/2015 by Dr Annamaria Boots      History of Present Illness 05/02/2015 1st   office visit/ [name] / transition of care from Dr Joya Gaskins  Chief Complaint  Patient presents with  . Follow-up    Former PW pt. Pt c/o occasional SOB with exertion. Pt states that her breathing is doing well. She recently had a cold but is improving. Pt denies cough/wheeze/CP/tightness. Pt is on Xolair and reports no complications/reactions.   maint rx with performist/budesonide rarely need combivent (very poor hfa noted - see a/p) Says other forms of ICS don't work. Concerned about longterm effects of meds  rec Combine your nebulizer solutions and take each am plus second treatment in pm if needed. Work on Doctor, hospital.     11/18/2017  f/u ov/Raenah Murley re:  Asthma/ rhinitis on xolair monthly and performist/bud daily  Chief Complaint  Patient presents with  . Follow-up    Last seen in 2017.  She is here just for f/u so she can continue on xolair. She denies any co's. She rarely uses her combivent for rescue.   Dyspnea:  Doe x steps on or off xolair (says worse when late for a single dose)  Cough: not a problem Sleeping: fine flat  SABA use:  None / poor understanding of smi technique for combivent  02: none   rec Combine your nebulizer solutions and take each am plus second treatment in pm if needed. Work on inhaler technique:    Only use your a combivent as a rescue medication    06/04/2019  f/u ov/Shiva Sahagian re: asthma/rhinitis on [place]/bud q am with optional second dose Chief Complaint  Patient presents with  . Follow-up  Dyspnea:  Not limited by breathing from desired  activities unless does lot of flights    Cough: minimal  Sleeping: bed is flat/one pillow  SABA use: hardly ever uses combivent/ very poor hfa  02: none   No obvious day to day or daytime variability or assoc excess/ purulent sputum or mucus plugs or hemoptysis or cp or chest tightness, subjective wheeze or overt sinus or hb symptoms.   Sleeping  without nocturnal  or early am exacerbation  of respiratory  c/o's or need for noct saba. Also denies any obvious fluctuation of symptoms with weather or environmental changes or other aggravating or alleviating factors except as outlined above   No unusual exposure hx or h/o childhood pna/ asthma or knowledge of premature birth.  Current Allergies, Complete Past Medical History, Past Surgical History, Family History, and Social History were reviewed in [facility] record.  ROS  The following are not active complaints unless bolded Hoarseness, sore throat, dysphagia, dental problems, itching, sneezing,  nasal congestion or discharge of excess mucus or purulent secretions, ear ache,   fever, chills, sweats, unintended wt loss or wt gain, classically pleuritic or exertional cp,  orthopnea pnd or arm/hand swelling  or leg swelling, presyncope, palpitations, abdominal pain, anorexia, nausea, vomiting, diarrhea  or change in bowel habits or change in bladder habits, change in stools or change in urine, dysuria, hematuria,  rash, arthralgias, visual complaints,  headache, numbness, weakness or ataxia or problems with walking or coordination,  change in mood or  memory.        Current Meds  Medication Sig  . Ascorbic Acid (VITAMIN C PO) Take 500 mg by mouth daily.  . budesonide (PULMICORT) 0.25 MG/2ML nebulizer solution Take 2 mLs (0.25 mg total) by nebulization daily. Once daily and will take again if needed  . Cholecalciferol (VITAMIN D PO) Take 5,000 Units by mouth daily.  [name] EPINEPHrine (EPIPEN JR) 0.15 MG/0.3ML injection Inject  0.15 mg into the muscle as needed.    . formoterol (PERFOROMIST) 20 MCG/2ML nebulizer solution Take 2 mLs (20 mcg total) by nebulization 2 (two) times daily.  . Ipratropium-Albuterol (COMBIVENT RESPIMAT) 20-100 MCG/ACT AERS respimat USE 1 INHALATION EVERY 6 HOURS AS NEEDED FOR WHEEZING  . levothyroxine (SYNTHROID) 75 MCG tablet Take 1 tablet daily on an empty stomach with only water for 30 minutes & no Antacid meds, Calcium or Magnesium for 4 hours & avoid Biotin  . loratadine (CLARITIN) 10 MG tablet Take 1 tablet (10 mg total) by mouth daily as needed for allergies.  [name] meclizine (ANTIVERT) 25 MG tablet Take 1/2 to 1 tablet 3 x /day as needed for dizziness / Vertigo  . sertraline (ZOLOFT) 50 MG tablet Take 1 tablet daily for mood.  . simvastatin (ZOCOR) 40 MG tablet Take 1 tablet daily for cholesterol.  . zinc gluconate 50 MG tablet Take 50 mg by mouth daily.                   Objective:   Physical Exam   amb wf nad  06/04/2019        125   11/18/2017     127   05/02/15 136 lb (61.689 kg)  11/29/14 136 lb (61.689 kg)  03/23/14 138 lb (62.596 kg)     Vital signs reviewed  06/04/2019  - Note at rest 02 sats  94% on RA     HEENT : pt wearing mask not removed for exam due to covid -19 concerns.    NECK :  without JVD/Nodes/TM/ nl carotid upstrokes bilaterally   LUNGS: no acc muscle use,  Nl contour chest which is clear to A and P bilaterally without cough on insp or exp maneuvers   CV:  RRR  no s3 or murmur or increase in P2, and no edema   ABD:  soft and nontender with nl inspiratory excursion in the supine position. No bruits or organomegaly appreciated, bowel sounds nl  MS:  Nl gait/ ext warm without deformities, calf tenderness, cyanosis or clubbing No obvious joint restrictions   SKIN: warm and dry without lesions    NEURO:  alert, approp, nl sensorium with  no motor or cerebellar deficits apparent.            Assessment:

## 2019-06-04 NOTE — Patient Instructions (Signed)
No change in your medication or xolair injections   Please schedule a follow up visit in 12 months but call sooner if needed  - bring your inhaler with you

## 2019-06-06 ENCOUNTER — Encounter: Payer: Self-pay | Admitting: Internal Medicine

## 2019-06-06 NOTE — Assessment & Plan Note (Signed)
Onset her late 20's Xolair rx since at least 2009 per EMR  - Spirometry 07/05/08  FEV1 1.60 (100%)  Ratio 62 p 15% resp to saba   - changed to monthly  interval per Dr Annamaria Boots rec as of 05/02/2015    - Spirometry 11/18/2017  FEV1 1,0  (68%)  Ratio 61  p am formoterol/bud - FENO 11/18/2017  =   27 on bud neb  - The proper method of use, as well as anticipated side effects, of a metered-dose inhaler are discussed and demonstrated to the patient. Improved effectiveness after extensive coaching during this visit to a level of approximately 50 % from a baseline of nearly 0 > rec continue to work on better hfa   Despite poor technique > All goals of chronic asthma control met including optimal function and elimination of symptoms with minimal need for rescue therapy.  Contingencies discussed in full including contacting this office immediately if not controlling the symptoms using the rule of two's.     >>> f/u q 12 m, no change rx          Each maintenance medication was reviewed in detail including emphasizing most importantly the difference between maintenance and prns and under what circumstances the prns are to be triggered using an action plan format where appropriate.  Total time for H and P, chart review, counseling,  and generating customized AVS unique to this office visit / charting = 20 min

## 2019-06-07 ENCOUNTER — Encounter: Payer: Self-pay | Admitting: Internal Medicine

## 2019-06-07 NOTE — Progress Notes (Signed)
Comprehensive Evaluation &  Examination     This very nice 86 y.o. MWF presents for a comprehensive evaluation and management of multiple medical co-morbidities.  Patient has been followed for HTN, HLD, Prediabetes  and Vitamin D Deficiency. Patient has COPD/Allergic Asthma and has been on Xolair followed by [name].      HTN predates since 2004. In 2013, she had a negative Stress Myoview.  Patient's BP has been controlled at home and patient denies any cardiac symptoms as chest pain, palpitations, shortness of breath, dizziness or ankle swelling. Today's BP is at goal - 110/66.      Patient's hyperlipidemia is controlled with diet and Simvastatin. Patient denies myalgias or other medication SE's. Last lipids were at goal:  Lab Results  Component Value Date   CHOL 182 02/04/2019   HDL 70 02/04/2019   LDLCALC 96 02/04/2019   TRIG 69 02/04/2019   CHOLHDL 2.6 02/04/2019      Patient was dx'd Hypothyroid in the 1980's and has been on Thyroid Replacement since.     Patient has hx/o prediabetes (A1c 6.0% / 2011, then 5.7% / 2012)  and patient denies reactive hypoglycemic symptoms, visual blurring, diabetic polys or paresthesias. Last A1c was Normal & at goal:  Lab Results  Component Value Date   HGBA1C 5.4 02/04/2019      Patient was dx'd Hypothyroid in the 29's.       Finally, patient has history of Vitamin D Deficiency and last Vitamin D was near goal: Lab Results  Component Value Date   VD25OH 65 02/04/2019    Current Outpatient Medications on File Prior to Visit  Medication Sig  . Ascorbic Acid (VITAMIN C PO) Take 500 mg by mouth daily.  [name] aspirin EC 81 MG tablet Take 81 mg by mouth daily.  . budesonide (PULMICORT) 0.25 MG/2ML nebulizer solution Take 2 mLs (0.25 mg total) by nebulization daily. Once daily and will take again if needed  . Cholecalciferol (VITAMIN D PO) Take 5,000 Units by mouth daily.  [name] EPINEPHrine (EPIPEN JR) 0.15 MG/0.3ML injection Inject 0.15 mg into the  muscle as needed.    . formoterol (PERFOROMIST) 20 MCG/2ML nebulizer solution Take 2 mLs (20 mcg total) by nebulization 2 (two) times daily.  . Ipratropium-Albuterol (COMBIVENT RESPIMAT) 20-100 MCG/ACT AERS respimat USE 1 INHALATION EVERY 6 HOURS AS NEEDED FOR WHEEZING  . levothyroxine (SYNTHROID) 75 MCG tablet Take 1 tablet daily on an empty stomach with only water for 30 minutes & no Antacid meds, Calcium or Magnesium for 4 hours & avoid Biotin  . loratadine (CLARITIN) 10 MG tablet Take 1 tablet (10 mg total) by mouth daily as needed for allergies.  [name] meclizine (ANTIVERT) 25 MG tablet Take 1/2 to 1 tablet 3 x /day as needed for dizziness / Vertigo  . sertraline (ZOLOFT) 50 MG tablet Take 1 tablet daily for mood.  . simvastatin (ZOCOR) 40 MG tablet Take 1 tablet daily for cholesterol.  . zinc gluconate 50 MG tablet Take 50 mg by mouth daily.   Current Facility-Administered Medications on File Prior to Visit  Medication  . omalizumab Arvid Right) injection 150 mg  . omalizumab Arvid Right) injection 150 mg   Allergies  Allergen Reactions  . Codeine Nausea Only  . Propoxyphene N-Acetaminophen Nausea Only  . Shellfish-Derived Products Rash   Past Medical History:  Diagnosis Date  . Asthma   . COPD (chronic obstructive pulmonary disease) ([place])   . Depression   . Dizziness   .  Hyperlipemia   . Hypertension   . Hypothyroidism   . Prediabetes   . SVD (spontaneous vaginal delivery)    x 2  . Vitamin D deficiency    Health Maintenance  Topic Date Due  . INFLUENZA VACCINE  09/05/2019  . TETANUS/TDAP  03/13/2027  . DEXA SCAN  Completed  . COVID-19 Vaccine  Completed  . PNA vac Low Risk Adult  Completed   Immunization History  Administered Date(s) Administered  . Influenza Whole 10/05/2009, 11/26/2010, 11/05/2011  . Influenza, High Dose Seasonal PF 11/20/2012, 11/22/2013, 11/21/2014, 11/10/2015, 11/19/2016, 11/20/2017, 11/10/2018  . PFIZER SARS-COV-2 Vaccination 04/04/2019, 04/28/2019  .  Pneumococcal Conjugate-13 11/22/2013  . Pneumococcal Polysaccharide-23 09/14/2008  . Td 10/09/2004, 03/12/2017  . Zoster 02/15/2013    Last Colon - 09/2005 - Dr Deborha Payment - No f/u due to age  Last MGM - 10/09/2016  Past Surgical History:  Procedure Laterality Date  . DILATATION & CURRETTAGE/HYSTEROSCOPY WITH RESECTOCOPE N/A 09/10/2013   Procedure: DILATATION & CURETTAGE/HYSTEROSCOPY WITH RESECTOCOPE;  Surgeon: Darlyn Chamber, MD;  Location: [facility];  Service: Gynecology;  Laterality: N/A;  . TONSILLECTOMY    . TUBAL LIGATION Bilateral 1970  . WISDOM TOOTH EXTRACTION     Family History  Problem Relation Age of Onset  . Thyroid disease Mother   . Cancer Brother        Thyroid  . Gout Brother   . Hodgkin's lymphoma Brother   . Other Father        unsure of medical history   Social History   Tobacco Use  . Smoking status: Never Smoker  . Smokeless tobacco: Never Used  Substance Use Topics  . Alcohol use: Yes    Comment: Wine daily  . Drug use: No    ROS Constitutional: Denies fever, chills, weight loss/gain, headaches, insomnia,  night sweats, and change in appetite. Does c/o fatigue. Eyes: Denies redness, blurred vision, diplopia, discharge, itchy, watery eyes.  ENT: Denies discharge, congestion, post nasal drip, epistaxis, sore throat, earache, hearing loss, dental pain, Tinnitus, Vertigo, Sinus pain, snoring.  Cardio: Denies chest pain, palpitations, irregular heartbeat, syncope, dyspnea, diaphoresis, orthopnea, PND, claudication, edema Respiratory: denies cough, dyspnea, DOE, pleurisy, hoarseness, laryngitis, wheezing.  Gastrointestinal: Denies dysphagia, heartburn, reflux, water brash, pain, cramps, nausea, vomiting, bloating, diarrhea, constipation, hematemesis, melena, hematochezia, jaundice, hemorrhoids Genitourinary: Denies dysuria, frequency, urgency, nocturia, hesitancy, discharge, hematuria, flank pain Breast: Breast lumps, nipple discharge, bleeding.    Musculoskeletal: Denies arthralgia, myalgia, stiffness, Jt. Swelling, pain, limp, and strain/sprain. Denies falls. Skin: Denies puritis, rash, hives, warts, acne, eczema, changing in skin lesion Neuro: No weakness, tremor, incoordination, spasms, paresthesia, pain Psychiatric: Denies confusion, memory loss, sensory loss. Denies Depression. Endocrine: Denies change in weight, skin, hair change, nocturia, and paresthesia, diabetic polys, visual blurring, hyper / hypo glycemic episodes.  Heme/Lymph: No excessive bleeding, bruising, enlarged lymph nodes.  Physical Exam  BP 110/66   Pulse 64   Temp (!) 97.4 F (36.3 C)   Resp 16   Ht 4' 11.5" (1.511 m)   Wt 124 lb 6.4 oz (56.4 kg)   SpO2 95%   BMI 24.71 kg/m   General Appearance: Well nourished, well groomed and in no apparent distress.  Eyes: PERRLA, EOMs, conjunctiva no swelling or erythema, normal fundi and vessels. Sinuses: No frontal/maxillary tenderness ENT/Mouth: EACs patent / TMs  nl. Nares clear without erythema, swelling, mucoid exudates. Oral hygiene is good. No erythema, swelling, or exudate. Tongue normal, non-obstructing. Tonsils not swollen or erythematous. Hearing normal.  Neck: Supple, thyroid not palpable. No bruits, nodes or JVD. Respiratory: Respiratory effort normal.  BS equal and clear bilateral without rales, rhonci, wheezing or stridor. Cardio: Heart sounds are normal with regular rate and rhythm and no murmurs, rubs or gallops. Peripheral pulses are normal and equal bilaterally without edema. No aortic or femoral bruits. Chest: symmetric with normal excursions and percussion. Breasts: Symmetric, without lumps, nipple discharge, retractions, or fibrocystic changes.  Abdomen: Flat, soft with bowel sounds active. Nontender, no guarding, rebound, hernias, masses, or organomegaly.  Lymphatics: Non tender without lymphadenopathy.  Genitourinary:  Musculoskeletal: Full ROM all peripheral extremities, joint stability,  5/5 strength, and normal gait. Skin: Warm and dry without rashes, lesions, cyanosis, clubbing or  ecchymosis.  Neuro: Cranial nerves intact, reflexes equal bilaterally. Normal muscle tone, no cerebellar symptoms. Sensation intact.  Pysch: Alert and oriented X 3, normal affect, Insight and Judgment appropriate.   Assessment and Plan  1. Essential hypertension  - CBC with Differential/Platelet - COMPLETE METABOLIC PANEL WITH GFR - Magnesium - TSH  2. Hyperlipidemia, mixed  - Lipid panel - TSH  3. Abnormal glucose  - Hemoglobin A1c - Insulin, random  4. Vitamin D deficiency  - VITAMIN D 25 Hydroxy  5. Hypothyroidism   6. Prediabetes   7. Extrinsic asthma   8. Screening for colorectal cancer  - POC Hemoccult Bld/Stl   9. Screening for ischemic heart disease   10. Medication management  - CBC with Differential/Platelet - COMPLETE METABOLIC PANEL WITH GFR - Magnesium - Lipid panel - TSH - Hemoglobin A1c - Insulin, random - VITAMIN D 25 Hydroxyl       Patient was counseled in prudent diet to achieve/maintain BMI less than 25 for weight control, BP monitoring, regular exercise and medications. Discussed med's effects and SE's. Screening labs and tests as requested with regular follow-up as recommended. Over 40 minutes of exam, counseling, chart review and high complex critical decision making was performed.   Kirtland Bouchard, MD

## 2019-06-07 NOTE — Patient Instructions (Signed)

## 2019-06-08 ENCOUNTER — Encounter: Payer: Self-pay | Admitting: Internal Medicine

## 2019-06-08 ENCOUNTER — Other Ambulatory Visit: Payer: Self-pay

## 2019-06-08 ENCOUNTER — Ambulatory Visit (INDEPENDENT_AMBULATORY_CARE_PROVIDER_SITE_OTHER): Payer: Medicare Other | Admitting: Internal Medicine

## 2019-06-08 VITALS — BP 110/66 | HR 64 | Temp 97.4°F | Resp 16 | Ht 59.5 in | Wt 124.4 lb

## 2019-06-08 DIAGNOSIS — E782 Mixed hyperlipidemia: Secondary | ICD-10-CM | POA: Diagnosis not present

## 2019-06-08 DIAGNOSIS — Z136 Encounter for screening for cardiovascular disorders: Secondary | ICD-10-CM | POA: Diagnosis not present

## 2019-06-08 DIAGNOSIS — E039 Hypothyroidism, unspecified: Secondary | ICD-10-CM | POA: Diagnosis not present

## 2019-06-08 DIAGNOSIS — Z79899 Other long term (current) drug therapy: Secondary | ICD-10-CM

## 2019-06-08 DIAGNOSIS — E559 Vitamin D deficiency, unspecified: Secondary | ICD-10-CM

## 2019-06-08 DIAGNOSIS — Z1211 Encounter for screening for malignant neoplasm of colon: Secondary | ICD-10-CM

## 2019-06-08 DIAGNOSIS — I10 Essential (primary) hypertension: Secondary | ICD-10-CM

## 2019-06-08 DIAGNOSIS — R7309 Other abnormal glucose: Secondary | ICD-10-CM | POA: Diagnosis not present

## 2019-06-08 DIAGNOSIS — J4540 Moderate persistent asthma, uncomplicated: Secondary | ICD-10-CM

## 2019-06-08 DIAGNOSIS — R7303 Prediabetes: Secondary | ICD-10-CM | POA: Diagnosis not present

## 2019-06-08 DIAGNOSIS — Z1212 Encounter for screening for malignant neoplasm of rectum: Secondary | ICD-10-CM

## 2019-06-09 ENCOUNTER — Ambulatory Visit (INDEPENDENT_AMBULATORY_CARE_PROVIDER_SITE_OTHER): Payer: Medicare Other

## 2019-06-09 DIAGNOSIS — J4540 Moderate persistent asthma, uncomplicated: Secondary | ICD-10-CM | POA: Diagnosis not present

## 2019-06-09 LAB — CBC WITH DIFFERENTIAL/PLATELET
Absolute Monocytes: 654 cells/uL (ref 200–950)
Basophils Absolute: 84 cells/uL (ref 0–200)
Basophils Relative: 1.1 %
Eosinophils Absolute: 160 cells/uL (ref 15–500)
Eosinophils Relative: 2.1 %
HCT: 38.6 % (ref 35.0–45.0)
Hemoglobin: 12.5 g/dL (ref 11.7–15.5)
Lymphs Abs: 1406 cells/uL (ref 850–3900)
MCH: 30 pg (ref 27.0–33.0)
MCHC: 32.4 g/dL (ref 32.0–36.0)
MCV: 92.8 fL (ref 80.0–100.0)
MPV: 11.1 fL (ref 7.5–12.5)
Monocytes Relative: 8.6 %
Neutro Abs: 5297 cells/uL (ref 1500–7800)
Neutrophils Relative %: 69.7 %
Platelets: 260 10*3/uL (ref 140–400)
RBC: 4.16 10*6/uL (ref 3.80–5.10)
RDW: 13 % (ref 11.0–15.0)
Total Lymphocyte: 18.5 %
WBC: 7.6 10*3/uL (ref 3.8–10.8)

## 2019-06-09 LAB — TSH: TSH: 3.16 mIU/L (ref 0.40–4.50)

## 2019-06-09 LAB — COMPLETE METABOLIC PANEL WITH GFR
AG Ratio: 1.5 (calc) (ref 1.0–2.5)
ALT: 10 U/L (ref 6–29)
AST: 18 U/L (ref 10–35)
Albumin: 4.1 g/dL (ref 3.6–5.1)
Alkaline phosphatase (APISO): 75 U/L (ref 37–153)
BUN: 24 mg/dL (ref 7–25)
CO2: 28 mmol/L (ref 20–32)
Calcium: 9.7 mg/dL (ref 8.6–10.4)
Chloride: 101 mmol/L (ref 98–110)
Creat: 0.7 mg/dL (ref 0.60–0.88)
GFR, Est African American: 93 mL/min/{1.73_m2} (ref >=60)
GFR, Est Non African American: 80 mL/min/{1.73_m2} (ref >=60)
Globulin: 2.7 g/dL (calc) (ref 1.9–3.7)
Glucose, Bld: 80 mg/dL (ref 65–99)
Potassium: 4.4 mmol/L (ref 3.5–5.3)
Sodium: 138 mmol/L (ref 135–146)
Total Bilirubin: 0.5 mg/dL (ref 0.2–1.2)
Total Protein: 6.8 g/dL (ref 6.1–8.1)

## 2019-06-09 LAB — HEMOGLOBIN A1C
Hgb A1c MFr Bld: 5.2 % of total Hgb (ref <5.7)
Mean Plasma Glucose: 103 (calc)
eAG (mmol/L): 5.7 (calc)

## 2019-06-09 LAB — LIPID PANEL
Cholesterol: 175 mg/dL (ref <200)
HDL: 67 mg/dL (ref >=50)
LDL Cholesterol (Calc): 89 mg/dL (calc)
Non-HDL Cholesterol (Calc): 108 mg/dL (calc) (ref <130)
Total CHOL/HDL Ratio: 2.6 (calc) (ref <5.0)
Triglycerides: 99 mg/dL (ref <150)

## 2019-06-09 LAB — URINALYSIS, ROUTINE W REFLEX MICROSCOPIC
Bilirubin Urine: NEGATIVE
Glucose, UA: NEGATIVE
Hgb urine dipstick: NEGATIVE
Ketones, ur: NEGATIVE
Leukocytes,Ua: NEGATIVE
Nitrite: NEGATIVE
Protein, ur: NEGATIVE
Specific Gravity, Urine: 1.015 (ref 1.001–1.03)
pH: <=5 (ref 5.0–8.0)

## 2019-06-09 LAB — MAGNESIUM: Magnesium: 2 mg/dL (ref 1.5–2.5)

## 2019-06-09 LAB — INSULIN, RANDOM: Insulin: 2.5 u[IU]/mL

## 2019-06-09 LAB — VITAMIN D 25 HYDROXY (VIT D DEFICIENCY, FRACTURES): Vit D, 25-Hydroxy: 57 ng/mL (ref 30–100)

## 2019-06-09 MED ORDER — OMALIZUMAB 150 MG/ML ~~LOC~~ SOSY
150.0000 mg | PREFILLED_SYRINGE | Freq: Once | SUBCUTANEOUS | Status: AC
  Administered 2019-06-09: 150 mg via SUBCUTANEOUS

## 2019-06-09 NOTE — Progress Notes (Signed)
Have you been hospitalized within the last 10 days?  No Do you have a fever?  No Do you have a cough?  No Do you have a headache or sore throat? No  

## 2019-06-28 ENCOUNTER — Telehealth: Payer: Self-pay | Admitting: Internal Medicine

## 2019-06-28 NOTE — Telephone Encounter (Signed)
Xolair Prefilled Syringe Order: 150mg  Prefilled Syringe:  #1 75mg  Prefilled Syringe: #0 Ordered Date: 06/28/2019 Expected date of arrival: 06/29/2019 Ordered by: Desmond Dike, [place]  [facility]ty [facility]

## 2019-06-29 NOTE — Telephone Encounter (Signed)
Xolair Prefilled Syringe Received:  150mg  Prefilled Syringe >> quantity #1, lot # [id number], exp date 03/2020 75mg  Prefilled Syringe >> N/A Medication arrival date: 06/29/2019 Received by: Desmond Dike, [place]

## 2019-07-09 ENCOUNTER — Ambulatory Visit: Payer: Medicare Other

## 2019-07-13 ENCOUNTER — Ambulatory Visit: Payer: Medicare Other

## 2019-07-14 ENCOUNTER — Ambulatory Visit (INDEPENDENT_AMBULATORY_CARE_PROVIDER_SITE_OTHER): Payer: Medicare Other

## 2019-07-14 ENCOUNTER — Other Ambulatory Visit: Payer: Self-pay

## 2019-07-14 DIAGNOSIS — J4540 Moderate persistent asthma, uncomplicated: Secondary | ICD-10-CM | POA: Diagnosis not present

## 2019-07-14 MED ORDER — OMALIZUMAB 150 MG/ML ~~LOC~~ SOSY
150.0000 mg | PREFILLED_SYRINGE | Freq: Once | SUBCUTANEOUS | Status: AC
  Administered 2019-07-14: 150 mg via SUBCUTANEOUS

## 2019-07-14 NOTE — Progress Notes (Signed)
Have you been hospitalized within the last 10 days?  No Do you have a fever?  No Do you have a cough?  No Do you have a headache or sore throat? No  

## 2019-07-20 ENCOUNTER — Encounter: Payer: Self-pay | Admitting: Internal Medicine

## 2019-07-20 ENCOUNTER — Other Ambulatory Visit: Payer: Self-pay | Admitting: *Deleted

## 2019-07-20 ENCOUNTER — Other Ambulatory Visit: Payer: Self-pay

## 2019-07-20 ENCOUNTER — Ambulatory Visit (INDEPENDENT_AMBULATORY_CARE_PROVIDER_SITE_OTHER): Payer: Medicare Other | Admitting: Internal Medicine

## 2019-07-20 VITALS — BP 128/66 | HR 60 | Temp 97.0°F | Resp 16 | Ht 59.5 in | Wt 129.9 lb

## 2019-07-20 DIAGNOSIS — E039 Hypothyroidism, unspecified: Secondary | ICD-10-CM

## 2019-07-20 DIAGNOSIS — M76892 Other specified enthesopathies of left lower limb, excluding foot: Secondary | ICD-10-CM | POA: Diagnosis not present

## 2019-07-20 DIAGNOSIS — L03113 Cellulitis of right upper limb: Secondary | ICD-10-CM

## 2019-07-20 MED ORDER — LEVOTHYROXINE SODIUM 75 MCG PO TABS: ORAL_TABLET | ORAL | 3 refills | Status: DC

## 2019-07-20 MED ORDER — AZITHROMYCIN 250 MG PO TABS: ORAL_TABLET | ORAL | 1 refills | Status: DC

## 2019-07-20 MED ORDER — DEXAMETHASONE 4 MG PO TABS: ORAL_TABLET | ORAL | 0 refills | Status: DC

## 2019-07-20 NOTE — Progress Notes (Signed)
History of Present Illness:  This nice 86 yo MWF presents with an apparent skin infection consequent of he cat scratching her Rt hand in several places. Also, unrelated she reports recent pain / tenderness in the Left popliteal space.   Medications  .  levothyroxine (SYNTHROID) 75 MCG tablet, Take 1 tablet daily on an empty stomach with only water for 30 minutes & no Antacid meds, Calcium or Magnesium for 4 hours & avoid Biotin .  EPINEPHrine (EPIPEN JR) 0.15 MG/0.3ML injection, Inject 0.15 mg into the muscle as needed.   .  simvastatin (ZOCOR) 40 MG tablet, Take 1 tablet daily for cholesterol. .  budesonide (PULMICORT) 0.25 MG/2ML nebulizer solution, Take 2 mLs (0.25 mg total) by nebulization daily. Once daily and will take again if needed .  formoterol (PERFOROMIST) 20 MCG/2ML nebulizer solution, Take 2 mLs (20 mcg total) by nebulization 2 (two) times daily. .  Ipratropium-Albuterol (COMBIVENT RESPIMAT) 20-100 MCG/ACT AERS respimat, USE 1 INHALATION EVERY 6 HOURS AS NEEDED FOR WHEEZING .  loratadine (CLARITIN) 10 MG tablet, Take 1 tablet (10 mg total) by mouth daily as needed for allergies. [name]  omalizumab Arvid Right) injection 150 mg .  omalizumab Arvid Right) injection 150 mg .  aspirin EC 81 MG tablet, Take 81 mg by mouth daily. .  Ascorbic Acid (VITAMIN C PO), Take 500 mg by mouth daily. .  Cholecalciferol (VITAMIN D PO), Take 5,000 Units by mouth daily. .  meclizine (ANTIVERT) 25 MG tablet, Take 1/2 to 1 tablet 3 x /day as needed for dizziness / Vertigo .  sertraline (ZOLOFT) 50 MG tablet, Take 1 tablet daily for mood. .  zinc gluconate 50 MG tablet, Take 50 mg by mouth daily.  Problem list She has Extrinsic asthma; GERD; Other abnormal glucose; Vitamin D deficiency; Hypothyroidism; Hyperlipemia; Essential hypertension; Medication management; BMI 24.0-24.9, adult; Encounter for Medicare annual wellness exam; Vertigo; Hearing loss; and Depression, major, recurrent, in partial remission ([place])  on their problem list.   Observations/Objective:  BP 128/66   Pulse 60   Temp (!) 97 F (36.1 C)   Resp 16   Ht 4' 11.5" (1.511 m)   Wt 129 lb 14.4 oz (58.9 kg)   BMI 25.80 kg/m   HEENT - WNL. Neck - supple.  Chest - Clear equal BS. Cor - Nl HS. RRR w/o sig MGR. PP 1(+). No edema. MS- FROM w/o deformities.  Gait Nl. Has tenderness along the lateral/medial tendon insertions of the posterior thigh muscle tendons at the popliteal area insertion. Neuro -  Nl w/o focal abnormalities. Skin - directed to Rt hand finds several area of excoriations over inflammed skin patches. No draining lesions or lymphangitis.   Assessment and Plan:  1. Cellulitis of right hand  - azithromycin (ZITHROMAX) 250 MG tablet; Take 2 tablets with Food on  Day 1, then 1 tablet Daily with Food for Infection  Dispense: 6 each; Refill: 1  2. Tendonitis of left knee  - dexamethasone (DECADRON) 4 MG tablet; Take 1 tab 3 x day - 2 days, then 2 x day -2 days, then 1 tab daily  Dispense: 13 tablet; Refill: 0  Follow Up Instructions:     I discussed the assessment and treatment plan with the patient. The patient was provided an opportunity to ask questions and all were answered. The patient agreed with the plan and demonstrated an understanding of the instructions.       The patient was advised to call back or seek an in-person evaluation  if the symptoms worsen or if the condition fails to improve as anticipated.   Kirtland Bouchard, MD

## 2019-08-02 ENCOUNTER — Telehealth: Payer: Self-pay | Admitting: Internal Medicine

## 2019-08-02 NOTE — Telephone Encounter (Signed)
Xolair Prefilled Syringe Order: 150mg  Prefilled Syringe:  #1 75mg  Prefilled Syringe: #0 Ordered Date: 08/02/2019 Expected date of arrival: 08/03/2019 Ordered by: Desmond Dike, [place]  [facility]ty [facility]: [facility]

## 2019-08-03 NOTE — Telephone Encounter (Signed)
Xolair Prefilled Syringe Received:  150mg  Prefilled Syringe >> quantity #1, lot # [id number], exp date 05/2020 75mg  Prefilled Syringe >> N/A Medication arrival date: 08/03/2019 Received by: Desmond Dike, [place]

## 2019-08-13 ENCOUNTER — Ambulatory Visit (INDEPENDENT_AMBULATORY_CARE_PROVIDER_SITE_OTHER): Payer: Medicare Other

## 2019-08-13 ENCOUNTER — Other Ambulatory Visit: Payer: Self-pay

## 2019-08-13 DIAGNOSIS — J4540 Moderate persistent asthma, uncomplicated: Secondary | ICD-10-CM

## 2019-08-13 MED ORDER — OMALIZUMAB 150 MG/ML ~~LOC~~ SOSY
150.0000 mg | PREFILLED_SYRINGE | Freq: Once | SUBCUTANEOUS | Status: AC
  Administered 2019-08-13: 150 mg via SUBCUTANEOUS

## 2019-08-13 NOTE — Progress Notes (Signed)
All questions were answered by the patient before medication was administered. Have you been hospitalized in the last 10 days? No Do you have a fever? No Do you have a cough? No Do you have a headache or sore throat? No  

## 2019-08-18 DIAGNOSIS — H25813 Combined forms of age-related cataract, bilateral: Secondary | ICD-10-CM | POA: Diagnosis not present

## 2019-09-03 DIAGNOSIS — H25811 Combined forms of age-related cataract, right eye: Secondary | ICD-10-CM | POA: Diagnosis not present

## 2019-09-03 DIAGNOSIS — H52221 Regular astigmatism, right eye: Secondary | ICD-10-CM | POA: Diagnosis not present

## 2019-09-03 HISTORY — PX: CATARACT EXTRACTION: SUR2

## 2019-09-06 ENCOUNTER — Telehealth: Payer: Self-pay | Admitting: Internal Medicine

## 2019-09-06 NOTE — Progress Notes (Signed)
MEDICARE ANNUAL WELLNESS VISIT AND FOLLOW UP Assessment:   Diagnoses and all orders for this visit:  Encounter for Medicare annual wellness exam Yearly  Moderate persistent extrinsic asthma without complication Doing well Avoid triggers, pollen, heavy now has not been outside much related to this. Using Pulmicort neb daily Using Perforomist neb BID Receiving monthly injections, Xolair 150mg   Depression, major, recurrent, in partial remission ([place]) Doing well on current regimen Continue Zoloft with benefit  Hyperlipidemia, unspecified hyperlipidemia type Continue statin; LDL goal <100 Will check lipids next visit  Hypothyroidism, unspecified type continue medications the same pending lab results reminded to take on an empty stomach 30-7mins before food.  check TSH level  Vertigo Doing well at this time Unknown triggers Has meclizine PRN  Vitamin D deficiency Continue supplimention Will check labs next visit  Other abnormal glucose Discussed dietary and exercise modifications  BMI 24.0-24.9, adult Discussed dietary and exercise modifications  Hearing loss  Needs hearing aids but patient feels limited need at this time; monitoring;  Declines any referral at this time   Follow Up Instructions:   I discussed the assessment and treatment plan with the patient. The patient was provided an opportunity to ask questions and all were answered. The patient agreed with the plan and demonstrated an understanding of the instructions.   The patient was advised to call back or seek an in-person evaluation if the symptoms worsen or if the condition fails to improve as anticipated.  I provided 30 minutes of face-to-face time during this encounter including counseling, chart review, and critical decision making was performed.  Future Appointments  Date Time Provider [place]  09/14/2019  1:30 PM [facility] None  12/13/2019  2:00 PM Liane Comber, NP [facility] None  06/12/2020  3:00 PM [name], MD [facility] None     Plan:   During the course of the visit the patient was educated and counseled about appropriate screening and preventive services including:    Pneumococcal vaccine   Influenza vaccine  Prevnar 13  Td vaccine  Screening electrocardiogram, deferred, COVID19, telephone visit.  Colorectal cancer screening  Diabetes screening  Glaucoma screening  Nutrition counseling    Subjective:  Kathy Whitaker is a 86 y.o. female who presents for Medicare Annual Wellness Visit and 3 month follow up for hypothyroidism, hyperlipidemia, abnormal glucose, depression and vitamin D Def.   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 On zoloft 50 mg daily for mood and feels symptoms are currently well controlled.    Patient has history of Asthma/allergies, follows with Dr. [name]. She is on pulmicort/performist once a day, combivent PRN, 2-3 times per month, and claritin and xolair injections that have helped significantly. Cold weather can be problematic but doing well recently.   Following with neurologist, Dr. Krista Blue, for vertigo. MRI on 12/14/2016 was unremarkable excepting age-appropriate microvascular changes. She reports symptoms have since improved, meclizine does improve symptoms significantly.   BMI is Body mass index is 25.42 kg/m., she has been working on diet , repots generally active but no current intentional exercise, plans to start walking.  Wt Readings from Last 3 Encounters:  09/08/19 128 lb (58.1 kg)  07/20/19 129 lb 14.4 oz (58.9 kg)  06/08/19 124 lb 6.4 oz (56.4 kg)   Her blood pressure has been controlled at home, today their BP is BP: 110/62 She denies chest pain, shortness of breath.   She is on  cholesterol medication  (simvastatin 40 mg daily) and denies myalgias. Her cholesterol is at goal. The cholesterol last  visit was:   Lab Results  Component Value Date   CHOL 175 06/08/2019   HDL 67 06/08/2019   LDLCALC 89 06/08/2019   TRIG 99 06/08/2019   CHOLHDL 2.6 06/08/2019   She has not been working on diet and exercise for abnormal glucose, and denies hyperglycemia, hypoglycemia , increased appetite, nausea, paresthesia of the feet, polydipsia, polyuria, visual disturbances and vomiting. Last A1C in the office was:  Lab Results  Component Value Date   HGBA1C 5.2 06/08/2019   Last GFR Lab Results  Component Value Date   GFRNONAA 80 06/08/2019   She is on thyroid medication. Her medication was not changed last visit.   Lab Results  Component Value Date   TSH 3.16 06/08/2019    Patient is on Vitamin D supplement.   Lab Results  Component Value Date   VD25OH 57 06/08/2019      Medication Review:  Current Outpatient Medications (Endocrine & Metabolic):  .  levothyroxine (SYNTHROID) 75 MCG tablet, Take 1 tablet daily on an empty stomach with only water for 30 minutes & no Antacid meds, Calcium or Magnesium for 4 hours & avoid Biotin   Current Outpatient Medications (Cardiovascular):  [name]  EPINEPHrine (EPIPEN JR) 0.15 MG/0.3ML injection, Inject 0.15 mg into the muscle as needed.   .  simvastatin (ZOCOR) 40 MG tablet, Take 1 tablet daily for cholesterol.   Current Outpatient Medications (Respiratory):  .  budesonide (PULMICORT) 0.25 MG/2ML nebulizer solution, Take 2 mLs (0.25 mg total) by nebulization daily. Once daily and will take again if needed .  formoterol (PERFOROMIST) 20 MCG/2ML nebulizer solution, Take 2 mLs (20 mcg total) by nebulization 2 (two) times daily. .  Ipratropium-Albuterol (COMBIVENT RESPIMAT) 20-100 MCG/ACT AERS respimat, USE 1 INHALATION EVERY 6 HOURS AS NEEDED FOR WHEEZING .  loratadine (CLARITIN) 10 MG tablet, Take 1 tablet (10 mg total) by mouth daily as needed for allergies.  Current Facility-Administered Medications (Respiratory):  .  omalizumab Arvid Right) injection  150 mg .  omalizumab Arvid Right) injection 150 mg  Current Outpatient Medications (Analgesics):  .  aspirin EC 81 MG tablet, Take 81 mg by mouth daily.     Current Outpatient Medications (Other):  [name]  Ascorbic Acid (VITAMIN C PO), Take 500 mg by mouth daily. .  Cholecalciferol (VITAMIN D PO), Take 5,000 Units by mouth daily. .  meclizine (ANTIVERT) 25 MG tablet, Take 1/2 to 1 tablet 3 x /day as needed for dizziness / Vertigo .  sertraline (ZOLOFT) 50 MG tablet, Take 1 tablet daily for mood. .  zinc gluconate 50 MG tablet, Take 50 mg by mouth daily.   Allergies: Allergies  Allergen Reactions  . Codeine Nausea Only  . Propoxyphene N-Acetaminophen Nausea Only  . Shellfish-Derived Products Rash    Current Problems (verified) has Extrinsic asthma; GERD; Other abnormal glucose; Vitamin D deficiency; Hypothyroidism; Hyperlipemia; Essential hypertension; Medication management; BMI 24.0-24.9, adult; Encounter for Medicare annual wellness exam; Vertigo; Hearing loss; and Depression, major, recurrent, in partial remission ([place]) on their problem list.  Screening Tests Immunization History  Administered Date(s) Administered  . Influenza Whole 10/05/2009, 11/26/2010, 11/05/2011  . Influenza, High Dose Seasonal PF 11/20/2012, 11/22/2013, 11/21/2014, 11/10/2015, 11/19/2016, 11/20/2017, 11/10/2018  . PFIZER SARS-COV-2 Vaccination 04/04/2019, 04/28/2019  . Pneumococcal Conjugate-13 11/22/2013  . Pneumococcal Polysaccharide-23 09/14/2008  . Td 10/09/2004, 03/12/2017  . Zoster 02/15/2013    Preventative care: Last colonoscopy: 2015  Mammogram: 2018, DUE? Gets q2y by Dr Velda Shell, did see him last year and believes had then will check, report requested DEXA: GYN follows, report requested   Prior vaccinations:  TD or Tdap: 2019  Influenza: 11/2018 Pneumococcal: 2010 Prevnar13: 2015 Shingles/Zostavax: 2015 Covid 19: 2/2, 2021, pfizer  Names of Other Physician/Practitioners you currently  use: 1. [facility] here for primary care 2. Eye Exam: Dr. Katy Fitch, last 2021, just had R cataract, will had L in 2 months  3. Dential Exam: 2021, goes q6m  Patient Care Team: [name], MD as PCP - General (Internal Medicine) Elsie Stain, MD (Pulmonary Disease) Arvella Nigh, MD as Consulting Physician (Obstetrics and Gynecology)  Surgical: She  has a past surgical history that includes Tubal ligation (Bilateral, 1970); Tonsillectomy; Wisdom tooth extraction; Dilatation & currettage/hysteroscopy with resectoscope (N/A, 09/10/2013); and Cataract extraction (Right, 09/03/2019). Family Her family history includes Cancer in her brother; Gout in her brother; Hodgkin's lymphoma in her brother; Other in her father; Thyroid disease in her mother. Social history  She reports that she has never smoked. She has never used smokeless tobacco. She reports current alcohol use. She reports that she does not use drugs.  MEDICARE WELLNESS OBJECTIVES: Physical activity: Current Exercise Habits: The patient does not participate in regular exercise at present, Exercise limited by: None identified Cardiac risk factors: Cardiac Risk Factors include: advanced age (>22men, >19 women);dyslipidemia;hypertension;sedentary lifestyle Depression/mood screen:   Depression screen [facility] 2/9 09/08/2019  Decreased Interest 0  Down, Depressed, Hopeless 0  PHQ - 2 Score 0  Some recent data might be hidden    ADLs:  In your present state of health, do you have any difficulty performing the following activities: 09/08/2019 06/08/2019  Hearing? N N  Vision? N N  Difficulty concentrating or making decisions? N N  Walking or climbing stairs? N N  Dressing or bathing? N N  Doing errands, shopping? N N  Some recent data might be hidden     Cognitive Testing  Alert? Yes  Normal Appearance?Yes  Oriented to person? Yes  Place? Yes   Time? Yes  Recall of three objects?  Yes  Can  perform simple calculations? Yes  Displays appropriate judgment?Yes  Can read the correct time from a watch face?Yes  EOL planning: Does Patient Have a Medical Advance Directive?: Yes Type of Advance Directive: Healthcare Power of Attorney, Living will Does patient want to make changes to medical advance directive?: No - Patient declined Copy of [place] in Chart?: No - copy requested   Objective:   Today's Vitals   09/08/19 1438  BP: 110/62  Pulse: 60  Temp: (!) 96.9 F (36.1 C)  SpO2: 96%  Weight: 128 lb (58.1 kg)   Body mass index is 25.42 kg/m.  General Appearance: Well nourished, in no apparent distress. Eyes: PERRLA, EOMs, conjunctiva no swelling or erythema Sinuses: No Frontal/maxillary tenderness ENT/Mouth: Ext aud canals clear, TMs without erythema, bulging. No erythema, swelling, or exudate on post pharynx.  Tonsils not swollen or erythematous. Hearing normal.  Neck: Supple, thyroid normal.  Respiratory: Respiratory effort normal, BS equal bilaterally without rales, rhonchi, wheezing or stridor.  Cardio: RRR with no MRGs. Brisk peripheral pulses without edema.  Abdomen: Soft, + BS.  Non tender, no guarding, rebound, hernias, masses. Lymphatics: Non tender without lymphadenopathy.  Musculoskeletal: Full ROM, no effusions, 5/5 strength, Normal gait Skin: Warm, dry without rashes, lesions, ecchymosis.  Neuro: Cranial nerves intact. No cerebellar symptoms.  Psych: Awake and oriented X 3, normal affect, Insight and Judgment appropriate.   Medicare Attestation I have personally reviewed: The patient's medical and social history Their use of alcohol, tobacco or illicit drugs Their current medications and supplements The patient's functional ability including ADLs,fall risks, home safety risks, cognitive, and hearing and visual impairment Diet and physical activities Evidence for depression or mood disorders  The patient's weight, height, BMI, have  been recorded in the chart.  I have made referrals, counseling, and provided education to the patient based on review of the above and I have provided the patient with a written personalized care plan for preventive services.     Garnet Sierras, NP [facility] 05/22/2018  9:30am

## 2019-09-06 NOTE — Telephone Encounter (Signed)
Xolair Prefilled Syringe Order: 150mg  Prefilled Syringe:  #1 75mg  Prefilled Syringe: #0 Ordered Date: 09/06/2019 Expected date of arrival: 09/07/2019 Ordered by: Desmond Dike, [place]  Specialty Pharmacy: [facility]

## 2019-09-07 NOTE — Telephone Encounter (Signed)
Xolair Prefilled Syringe Received:  150mg  Prefilled Syringe >> quantity #1, lot # [id number], exp date 07/2020 75mg  Prefilled Syringe >> N/A Medication arrival date: 09/07/2019 Received by: Desmond Dike, [place]

## 2019-09-08 ENCOUNTER — Encounter: Payer: Self-pay | Admitting: Adult Health

## 2019-09-08 ENCOUNTER — Ambulatory Visit (INDEPENDENT_AMBULATORY_CARE_PROVIDER_SITE_OTHER): Payer: Medicare Other | Admitting: Adult Health

## 2019-09-08 ENCOUNTER — Other Ambulatory Visit: Payer: Self-pay

## 2019-09-08 VITALS — BP 110/62 | HR 60 | Temp 96.9°F | Wt 128.0 lb

## 2019-09-08 DIAGNOSIS — R42 Dizziness and giddiness: Secondary | ICD-10-CM | POA: Diagnosis not present

## 2019-09-08 DIAGNOSIS — E039 Hypothyroidism, unspecified: Secondary | ICD-10-CM

## 2019-09-08 DIAGNOSIS — Z6824 Body mass index (BMI) 24.0-24.9, adult: Secondary | ICD-10-CM

## 2019-09-08 DIAGNOSIS — J4540 Moderate persistent asthma, uncomplicated: Secondary | ICD-10-CM | POA: Diagnosis not present

## 2019-09-08 DIAGNOSIS — E559 Vitamin D deficiency, unspecified: Secondary | ICD-10-CM | POA: Diagnosis not present

## 2019-09-08 DIAGNOSIS — K219 Gastro-esophageal reflux disease without esophagitis: Secondary | ICD-10-CM | POA: Diagnosis not present

## 2019-09-08 DIAGNOSIS — E785 Hyperlipidemia, unspecified: Secondary | ICD-10-CM | POA: Diagnosis not present

## 2019-09-08 DIAGNOSIS — R6889 Other general symptoms and signs: Secondary | ICD-10-CM | POA: Diagnosis not present

## 2019-09-08 DIAGNOSIS — F3341 Major depressive disorder, recurrent, in partial remission: Secondary | ICD-10-CM

## 2019-09-08 DIAGNOSIS — Z0000 Encounter for general adult medical examination without abnormal findings: Secondary | ICD-10-CM

## 2019-09-08 DIAGNOSIS — Z0001 Encounter for general adult medical examination with abnormal findings: Secondary | ICD-10-CM

## 2019-09-08 DIAGNOSIS — Z79899 Other long term (current) drug therapy: Secondary | ICD-10-CM

## 2019-09-08 DIAGNOSIS — R7309 Other abnormal glucose: Secondary | ICD-10-CM

## 2019-09-08 DIAGNOSIS — H9190 Unspecified hearing loss, unspecified ear: Secondary | ICD-10-CM

## 2019-09-08 DIAGNOSIS — I10 Essential (primary) hypertension: Secondary | ICD-10-CM | POA: Diagnosis not present

## 2019-09-08 NOTE — Patient Instructions (Signed)
Kathy Whitaker , Thank you for taking time to come for your Medicare Wellness Visit. I appreciate your ongoing commitment to your health goals. Please review the following plan we discussed and let me know if I can assist you in the future.   These are the goals we discussed: Goals    . Exercise 150 min/wk Moderate Activity       This is a list of the screening recommended for you and due dates:  Health Maintenance  Topic Date Due  . Flu Shot  09/05/2019  . Tetanus Vaccine  03/13/2027  . DEXA scan (bone density measurement)  Completed  . COVID-19 Vaccine  Completed  . Pneumonia vaccines  Completed      Exercising to Stay Healthy To become healthy and stay healthy, it is recommended that you do moderate-intensity and vigorous-intensity exercise. You can tell that you are exercising at a moderate intensity if your heart starts beating faster and you start breathing faster but can still hold a conversation. You can tell that you are exercising at a vigorous intensity if you are breathing much harder and faster and cannot hold a conversation while exercising. Exercising regularly is important. It has many health benefits, such as:  Improving overall fitness, flexibility, and endurance.  Increasing bone density.  Helping with weight control.  Decreasing body fat.  Increasing muscle strength.  Reducing stress and tension.  Improving overall health. How often should I exercise? Choose an activity that you enjoy, and set realistic goals. Your health care provider can help you make an activity plan that works for you. Exercise regularly as told by your health care provider. This may include:  Doing strength training two times a week, such as: ? Lifting weights. ? Using resistance bands. ? Push-ups. ? Sit-ups. ? Yoga.  Doing a certain intensity of exercise for a given amount of time. Choose from these options: ? A total of 150 minutes of moderate-intensity exercise every week. ? A  total of 75 minutes of vigorous-intensity exercise every week. ? A mix of moderate-intensity and vigorous-intensity exercise every week. Children, pregnant women, people who have not exercised regularly, people who are overweight, and older adults may need to talk with a health care provider about what activities are safe to do. If you have a medical condition, be sure to talk with your health care provider before you start a new exercise program. What are some exercise ideas? Moderate-intensity exercise ideas include:  Walking 1 mile (1.6 km) in about 15 minutes.  Biking.  Hiking.  Golfing.  Dancing.  Water aerobics. Vigorous-intensity exercise ideas include:  Walking 4.5 miles (7.2 km) or more in about 1 hour.  Jogging or running 5 miles (8 km) in about 1 hour.  Biking 10 miles (16.1 km) or more in about 1 hour.  Lap swimming.  Roller-skating or in-line skating.  Cross-country skiing.  Vigorous competitive sports, such as football, basketball, and soccer.  Jumping rope.  Aerobic dancing. What are some everyday activities that can help me to get exercise?  [place] work, such as: ? Pushing a Conservation officer, nature. ? Raking and bagging leaves.  Washing your car.  Pushing a stroller.  Shoveling snow.  Gardening.  Washing windows or floors. How can I be more active in my day-to-day activities?  Use stairs instead of an elevator.  Take a walk during your lunch break.  If you drive, park your car farther away from your work or school.  If you take public transportation, get  off one stop early and walk the rest of the way.  Stand up or walk around during all of your indoor phone calls.  Get up, stretch, and walk around every 30 minutes throughout the day.  Enjoy exercise with a friend. Support to continue exercising will help you keep a regular routine of activity. What guidelines can I follow while exercising?  Before you start a new exercise program, talk with your  health care provider.  Do not exercise so much that you hurt yourself, feel dizzy, or get very short of breath.  Wear comfortable clothes and wear shoes with good support.  Drink plenty of water while you exercise to prevent dehydration or heat stroke.  Work out until your breathing and your heartbeat get faster. Where to find more information  U.S. Department of Health and Human Services: BondedCompany.at  Centers for Disease Control and Prevention (CDC): http://www.wolf.info/ Summary  Exercising regularly is important. It will improve your overall fitness, flexibility, and endurance.  Regular exercise also will improve your overall health. It can help you control your weight, reduce stress, and improve your bone density.  Do not exercise so much that you hurt yourself, feel dizzy, or get very short of breath.  Before you start a new exercise program, talk with your health care provider. This information is not intended to replace advice given to you by your health care provider. Make sure you discuss any questions you have with your health care provider. Document Revised: 01/03/2017 Document Reviewed: 12/12/2016 Elsevier Patient Education  2020 [employer].

## 2019-09-09 ENCOUNTER — Other Ambulatory Visit: Payer: Self-pay | Admitting: *Deleted

## 2019-09-09 DIAGNOSIS — Z1211 Encounter for screening for malignant neoplasm of colon: Secondary | ICD-10-CM

## 2019-09-09 DIAGNOSIS — Z1212 Encounter for screening for malignant neoplasm of rectum: Secondary | ICD-10-CM | POA: Diagnosis not present

## 2019-09-09 LAB — COMPLETE METABOLIC PANEL WITH GFR
AG Ratio: 1.5 (calc) (ref 1.0–2.5)
ALT: 11 U/L (ref 6–29)
AST: 20 U/L (ref 10–35)
Albumin: 4 g/dL (ref 3.6–5.1)
Alkaline phosphatase (APISO): 79 U/L (ref 37–153)
BUN: 14 mg/dL (ref 7–25)
CO2: 31 mmol/L (ref 20–32)
Calcium: 9.7 mg/dL (ref 8.6–10.4)
Chloride: 104 mmol/L (ref 98–110)
Creat: 0.74 mg/dL (ref 0.60–0.88)
GFR, Est African American: 87 mL/min/{1.73_m2} (ref >=60)
GFR, Est Non African American: 75 mL/min/{1.73_m2} (ref >=60)
Globulin: 2.7 g/dL (calc) (ref 1.9–3.7)
Glucose, Bld: 92 mg/dL (ref 65–99)
Potassium: 4.5 mmol/L (ref 3.5–5.3)
Sodium: 140 mmol/L (ref 135–146)
Total Bilirubin: 0.5 mg/dL (ref 0.2–1.2)
Total Protein: 6.7 g/dL (ref 6.1–8.1)

## 2019-09-09 LAB — POC HEMOCCULT BLD/STL (HOME/3-CARD/SCREEN)
Card #2 Fecal Occult Blod, POC: NEGATIVE
Card #3 Fecal Occult Blood, POC: NEGATIVE
Fecal Occult Blood, POC: NEGATIVE

## 2019-09-09 LAB — CBC WITH DIFFERENTIAL/PLATELET
Absolute Monocytes: 583 cells/uL (ref 200–950)
Basophils Absolute: 80 cells/uL (ref 0–200)
Basophils Relative: 1.2 %
Eosinophils Absolute: 295 cells/uL (ref 15–500)
Eosinophils Relative: 4.4 %
HCT: 39.3 % (ref 35.0–45.0)
Hemoglobin: 12.7 g/dL (ref 11.7–15.5)
Lymphs Abs: 1541 cells/uL (ref 850–3900)
MCH: 29.9 pg (ref 27.0–33.0)
MCHC: 32.3 g/dL (ref 32.0–36.0)
MCV: 92.5 fL (ref 80.0–100.0)
MPV: 11.2 fL (ref 7.5–12.5)
Monocytes Relative: 8.7 %
Neutro Abs: 4201 cells/uL (ref 1500–7800)
Neutrophils Relative %: 62.7 %
Platelets: 259 10*3/uL (ref 140–400)
RBC: 4.25 10*6/uL (ref 3.80–5.10)
RDW: 13.2 % (ref 11.0–15.0)
Total Lymphocyte: 23 %
WBC: 6.7 10*3/uL (ref 3.8–10.8)

## 2019-09-09 LAB — MAGNESIUM: Magnesium: 2.1 mg/dL (ref 1.5–2.5)

## 2019-09-09 LAB — TSH: TSH: 3.13 mIU/L (ref 0.40–4.50)

## 2019-09-09 LAB — LIPID PANEL
Cholesterol: 175 mg/dL (ref <200)
HDL: 74 mg/dL (ref >=50)
LDL Cholesterol (Calc): 82 mg/dL (calc)
Non-HDL Cholesterol (Calc): 101 mg/dL (calc) (ref <130)
Total CHOL/HDL Ratio: 2.4 (calc) (ref <5.0)
Triglycerides: 101 mg/dL (ref <150)

## 2019-09-14 ENCOUNTER — Ambulatory Visit (INDEPENDENT_AMBULATORY_CARE_PROVIDER_SITE_OTHER): Payer: Medicare Other

## 2019-09-14 ENCOUNTER — Other Ambulatory Visit: Payer: Self-pay

## 2019-09-14 ENCOUNTER — Ambulatory Visit: Payer: Medicare Other

## 2019-09-14 DIAGNOSIS — J4540 Moderate persistent asthma, uncomplicated: Secondary | ICD-10-CM | POA: Diagnosis not present

## 2019-09-14 MED ORDER — OMALIZUMAB 150 MG/ML ~~LOC~~ SOSY
150.0000 mg | PREFILLED_SYRINGE | Freq: Once | SUBCUTANEOUS | Status: AC
  Administered 2019-09-14: 150 mg via SUBCUTANEOUS

## 2019-09-14 NOTE — Progress Notes (Signed)
All questions were answered by the patient before medication was administered. Have you been hospitalized in the last 10 days? No Do you have a fever? No Do you have a cough? No Do you have a headache or sore throat? No  

## 2019-09-16 ENCOUNTER — Encounter: Payer: Self-pay | Admitting: Adult Health

## 2019-09-16 DIAGNOSIS — M810 Age-related osteoporosis without current pathological fracture: Secondary | ICD-10-CM | POA: Insufficient documentation

## 2019-09-17 ENCOUNTER — Encounter: Payer: Self-pay | Admitting: Internal Medicine

## 2019-10-04 ENCOUNTER — Telehealth: Payer: Self-pay | Admitting: Internal Medicine

## 2019-10-04 NOTE — Telephone Encounter (Signed)
Xolair Prefilled Syringe Order: 150mg  Prefilled Syringe:  #1 75mg  Prefilled Syringe: #N/A Ordered Date: 10/04/19 Expected date of arrival: 10/05/19 Ordered by: [place]: [facility]

## 2019-10-06 ENCOUNTER — Telehealth: Payer: Self-pay | Admitting: Internal Medicine

## 2019-10-06 NOTE — Telephone Encounter (Signed)
Xolair Prefilled Syringe Received:  150mg  Prefilled Syringe >> quantity #1, lot # [id number], exp date 10/03/2020 75mg  Prefilled Syringe >> quantity #N/A Medication arrival date: 10/06/19 Received by: Finnean Cerami,LPN

## 2019-10-06 NOTE — Telephone Encounter (Signed)
Patient scheduled 10/14/19 at 1200. Nothing further at this time.

## 2019-10-07 ENCOUNTER — Telehealth: Payer: Self-pay | Admitting: Internal Medicine

## 2019-10-07 NOTE — Telephone Encounter (Signed)
Called and spoke with Patient.  Patient rescheduled 10/19/19 at 1330.

## 2019-10-14 ENCOUNTER — Ambulatory Visit: Payer: Medicare Other

## 2019-10-15 ENCOUNTER — Ambulatory Visit: Payer: Medicare Other

## 2019-10-18 ENCOUNTER — Ambulatory Visit (INDEPENDENT_AMBULATORY_CARE_PROVIDER_SITE_OTHER): Payer: Medicare Other | Admitting: Physician Assistant

## 2019-10-18 ENCOUNTER — Encounter: Payer: Self-pay | Admitting: Physician Assistant

## 2019-10-18 ENCOUNTER — Other Ambulatory Visit: Payer: Self-pay

## 2019-10-18 VITALS — BP 114/68 | HR 75 | Temp 97.5°F | Wt 126.0 lb

## 2019-10-18 DIAGNOSIS — R21 Rash and other nonspecific skin eruption: Secondary | ICD-10-CM

## 2019-10-18 MED ORDER — TRIAMCINOLONE ACETONIDE 0.1 % EX OINT: 1.0000 "application " | TOPICAL_OINTMENT | Freq: Two times a day (BID) | CUTANEOUS | 1 refills | Status: DC

## 2019-10-18 MED ORDER — PERMETHRIN 5 % EX CREA: 1.0000 "application " | TOPICAL_CREAM | Freq: Once | CUTANEOUS | 1 refills | Status: AC

## 2019-10-18 NOTE — Patient Instructions (Addendum)
Get on zyrtec or certizine for skin rash Can apply the topical triamcinolone  Do the Permethrin skin cream Leave on 8-14 hours and then shower If not better will refer to derm What is this medicine? PERMETHRIN (per METH rin) skin cream is used to treat scabies. This medicine may be used for other purposes; ask your health care provider or pharmacist if you have questions. COMMON BRAND NAME(S): Acticin, Elimite What should I tell my health care provider before I take this medicine? They need to know if you have any of these conditions:  asthma  an unusual or allergic reaction to permethrin, veterinary or household insecticides, other medicines, chrysanthemums, foods, dyes, or preservatives  pregnant or trying to get pregnant  breast-feeding How should I use this medicine? This medicine is for external use only. Do not take by mouth. Follow the directions on the prescription label. A bath or shower is NOT recommended before applying this medicine. Thoroughly rub the cream into all skin surfaces, from your head to the soles of your feet. It is important to apply it everywhere on your body, not just where the rash is. Apply the cream between fingers and toe creases, in the folds of the wrist and waistline, in the cleft of the buttocks, on the genitals, and in the belly button. Use a toothpick to apply the cream beneath your fingernails and toenails. Nails should be cut short. If you have little or no hair, or you are applying the cream to an infant or young child, make sure you rub the cream into the neck, scalp, hairline, temples, and forehead. Leave it on for 8 to 14 hours, then remove it by bathing and shampooing. If you are applying this medicine to another person, wear plastic or disposable gloves to protect yourself from infestation. Do not get this medicine in your eyes. If you do, rinse out with plenty of cool tap water. Talk to your pediatrician regarding the use of this medicine in  children. While this drug may be prescribed for children as young as 63 months of age for selected conditions, precautions do apply. Overdosage: If you think you have taken too much of this medicine contact a poison control center or emergency room at once. NOTE: This medicine is only for you. Do not share this medicine with others. What if I miss a dose? This does not apply. What may interact with this medicine? Interactions are not expected. Do not use any other skin products on the affected area without telling your doctor or health care professional. This list may not describe all possible interactions. Give your health care provider a list of all the medicines, herbs, non-prescription drugs, or dietary supplements you use. Also tell them if you smoke, drink alcohol, or use illegal drugs. Some items may interact with your medicine. What should I watch for while using this medicine? It is not unusual for itching and rash to continue for as long as 2 to 4 weeks after treatment. These symptoms may be a temporary reaction to the remains of the mites. This does not mean this cream did not work or that it needs to be reapplied. If you feel that the itching and rash is intense or if it continues beyond 4 weeks, talk to your doctor or health care professional right away. Scabies is spread by direct skin contact with an infected person. Family members and sexual partners may require treatment with this medicine. You should discuss this with your doctor or health care  professional. Using a normal washing cycle, you should wash all clothing, towels and bed linen that has touched your skin. You do not need to rewash clean clothing that has not yet been worn. Coats, furniture, rugs, floors, and walls do not need to be cleaned in any special manner. What side effects may I notice from receiving this medicine? Side effects that usually do not require medical attention (report to your doctor or health care  professional if they continue or are bothersome):  itching  numbness  rash  redness or mild swelling of the skin  stinging or burning  tingling sensation This list may not describe all possible side effects. Call your doctor for medical advice about side effects. You may report side effects to FDA at 1-800-FDA-1088. Where should I keep my medicine? Keep out of the reach of children. Store at room temperature away from heat and direct light. Do not refrigerate or freeze. Throw away any unused medicine after the expiration date. NOTE: This sheet is a summary. It may not cover all possible information. If you have questions about this medicine, talk to your doctor, pharmacist, or health care provider.  2020 Elsevier/Gold Standard (2015-02-22 17:09:34)

## 2019-10-18 NOTE — Progress Notes (Signed)
   Subjective:    Patient ID: Kathy Whitaker, female    DOB: 09-04-1933, 86 y.o.   MRN: [id number]  HPI 86 y.o. WF presents with bites that she notices every morning on her body for several weeks. They are very itchy. She called [facility] and they did not see fleas, bed bugs without anything.   Denies specific medication, food, skin care product, detergent, soap, or other environmental triggers have been identified. She did not experience concomitant cardiopulmonary or GI symptoms.  No new supplements.  She did travel to a cabin but no one else has rash.  Does have cats but they are indoor.  She has no specific nasal symptom complaints today.  Blood pressure 114/68, pulse 75, temperature (!) 97.5 F (36.4 C), weight 126 lb (57.2 kg), SpO2 94 %.  Medications  Current Outpatient Medications (Endocrine & Metabolic):  .  levothyroxine (SYNTHROID) 75 MCG tablet, Take 1 tablet daily on an empty stomach with only water for 30 minutes & no Antacid meds, Calcium or Magnesium for 4 hours & avoid Biotin   Current Outpatient Medications (Cardiovascular):  [name]  EPINEPHrine (EPIPEN JR) 0.15 MG/0.3ML injection, Inject 0.15 mg into the muscle as needed.   .  simvastatin (ZOCOR) 40 MG tablet, Take 1 tablet daily for cholesterol.   Current Outpatient Medications (Respiratory):  .  budesonide (PULMICORT) 0.25 MG/2ML nebulizer solution, Take 2 mLs (0.25 mg total) by nebulization daily. Once daily and will take again if needed .  formoterol (PERFOROMIST) 20 MCG/2ML nebulizer solution, Take 2 mLs (20 mcg total) by nebulization 2 (two) times daily. .  Ipratropium-Albuterol (COMBIVENT RESPIMAT) 20-100 MCG/ACT AERS respimat, USE 1 INHALATION EVERY 6 HOURS AS NEEDED FOR WHEEZING .  loratadine (CLARITIN) 10 MG tablet, Take 1 tablet (10 mg total) by mouth daily as needed for allergies.  Current Facility-Administered Medications (Respiratory):  .  omalizumab Arvid Right) injection 150 mg .  omalizumab Arvid Right) injection  150 mg  Current Outpatient Medications (Analgesics):  .  aspirin EC 81 MG tablet, Take 81 mg by mouth daily.     Current Outpatient Medications (Other):  [name]  Ascorbic Acid (VITAMIN C PO), Take 500 mg by mouth daily. .  Cholecalciferol (VITAMIN D PO), Take 5,000 Units by mouth daily. .  meclizine (ANTIVERT) 25 MG tablet, Take 1/2 to 1 tablet 3 x /day as needed for dizziness / Vertigo .  sertraline (ZOLOFT) 50 MG tablet, Take 1 tablet daily for mood. .  zinc gluconate 50 MG tablet, Take 50 mg by mouth daily.   Problem list She has Extrinsic asthma; GERD; Other abnormal glucose; Vitamin D deficiency; Hypothyroidism; Hyperlipemia; Essential hypertension; Medication management; BMI 24.0-24.9, adult; Encounter for Medicare annual wellness exam; Vertigo; Hearing loss; Depression, major, recurrent, in partial remission ([place]); and Osteoporosis on their problem list.    Review of Systems     Objective:   Physical Exam          Assessment & Plan:   Rash- ? Scabies  Will treat with permethrin lotion and tiramcinolone ointment, add on zyrtec, if not better follow up in the office -     permethrin (ELIMITE) 5 % cream; Apply 1 application topically once for 1 dose. -     triamcinolone ointment (KENALOG) 0.1 %; Apply 1 application topically 2 (two) times daily.

## 2019-10-19 ENCOUNTER — Other Ambulatory Visit: Payer: Self-pay

## 2019-10-19 ENCOUNTER — Ambulatory Visit: Payer: Medicare Other | Admitting: Adult Health

## 2019-10-19 ENCOUNTER — Ambulatory Visit (INDEPENDENT_AMBULATORY_CARE_PROVIDER_SITE_OTHER): Payer: Medicare Other

## 2019-10-19 DIAGNOSIS — J4540 Moderate persistent asthma, uncomplicated: Secondary | ICD-10-CM | POA: Diagnosis not present

## 2019-10-19 MED ORDER — OMALIZUMAB 150 MG/ML ~~LOC~~ SOSY
150.0000 mg | PREFILLED_SYRINGE | Freq: Once | SUBCUTANEOUS | Status: AC
  Administered 2019-10-19: 150 mg via SUBCUTANEOUS

## 2019-10-19 NOTE — Progress Notes (Signed)
Have you been hospitalized within the last 10 days?  No Do you have a fever?  No Do you have a cough?  No Do you have a headache or sore throat? No Do you have your Epi Pen visible and is it within date?  Yes 

## 2019-10-27 ENCOUNTER — Telehealth: Payer: Self-pay | Admitting: Physician Assistant

## 2019-10-27 MED ORDER — PERMETHRIN 5 % EX CREA: 1.0000 "application " | TOPICAL_CREAM | Freq: Once | CUTANEOUS | 0 refills | Status: AC

## 2019-10-27 NOTE — Telephone Encounter (Signed)
-----   Message from Elenor Quinones, [place] sent at 10/26/2019  4:14 PM EDT ----- Regarding: rX REQUEST PATIENT HAS REQUESTED Rx. For PERMETHRIN 5% CREAM 60GM

## 2019-11-01 ENCOUNTER — Telehealth: Payer: Self-pay | Admitting: Internal Medicine

## 2019-11-01 NOTE — Telephone Encounter (Signed)
Xolair Prefilled Syringe Order: 150mg  Prefilled Syringe:  #1 75mg  Prefilled Syringe: #n/a Ordered Date: 11/01/19 Expected date of arrival: 11/03/19 Ordered by: [place]: Nigel Mormon

## 2019-11-04 NOTE — Telephone Encounter (Signed)
Xolair Prefilled Syringe Received:  150mg  Prefilled Syringe >> quantity #1, lot # [id number], exp date 10/04/2020 75mg  Prefilled Syringe>> n/a Medication arrival date: 11/04/19 Received by: Andrianna Manalang,LPN

## 2019-11-17 ENCOUNTER — Other Ambulatory Visit: Payer: Self-pay

## 2019-11-17 ENCOUNTER — Ambulatory Visit (INDEPENDENT_AMBULATORY_CARE_PROVIDER_SITE_OTHER): Payer: Medicare Other

## 2019-11-17 VITALS — BP 122/66 | Temp 97.6°F | Wt 127.0 lb

## 2019-11-17 DIAGNOSIS — Z23 Encounter for immunization: Secondary | ICD-10-CM

## 2019-11-17 DIAGNOSIS — L539 Erythematous condition, unspecified: Secondary | ICD-10-CM

## 2019-11-17 DIAGNOSIS — H2512 Age-related nuclear cataract, left eye: Secondary | ICD-10-CM | POA: Diagnosis not present

## 2019-11-18 ENCOUNTER — Ambulatory Visit: Payer: Medicare Other

## 2019-11-19 DIAGNOSIS — H52222 Regular astigmatism, left eye: Secondary | ICD-10-CM | POA: Diagnosis not present

## 2019-11-19 DIAGNOSIS — H25812 Combined forms of age-related cataract, left eye: Secondary | ICD-10-CM | POA: Diagnosis not present

## 2019-11-22 ENCOUNTER — Telehealth: Payer: Self-pay | Admitting: Internal Medicine

## 2019-11-22 NOTE — Telephone Encounter (Signed)
ATC Patient.  LM to call back to schedule Xolair injection.

## 2019-11-24 ENCOUNTER — Ambulatory Visit: Payer: Medicare Other | Admitting: Adult Health

## 2019-11-24 NOTE — Telephone Encounter (Signed)
Pt is needing to get in for her Xolair injection - please advise [phone]

## 2019-11-24 NOTE — Telephone Encounter (Signed)
Patient scheduled 11/25/19 at 1015 for Xolair injection.

## 2019-11-25 ENCOUNTER — Other Ambulatory Visit: Payer: Self-pay

## 2019-11-25 ENCOUNTER — Ambulatory Visit (INDEPENDENT_AMBULATORY_CARE_PROVIDER_SITE_OTHER): Payer: Medicare Other

## 2019-11-25 DIAGNOSIS — J4540 Moderate persistent asthma, uncomplicated: Secondary | ICD-10-CM

## 2019-11-25 MED ORDER — OMALIZUMAB 150 MG/ML ~~LOC~~ SOSY
150.0000 mg | PREFILLED_SYRINGE | Freq: Once | SUBCUTANEOUS | Status: AC
  Administered 2019-11-25: 150 mg via SUBCUTANEOUS

## 2019-11-25 NOTE — Progress Notes (Signed)
Have you been hospitalized within the last 10 days?  No Do you have a fever?  No Do you have a cough?  No Do you have a headache or sore throat? No Do you have your Epi Pen visible and is it within date?  Yes 

## 2019-12-13 ENCOUNTER — Other Ambulatory Visit: Payer: Self-pay

## 2019-12-13 ENCOUNTER — Ambulatory Visit: Payer: Medicare Other | Admitting: Adult Health Nurse Practitioner

## 2019-12-13 ENCOUNTER — Encounter: Payer: Self-pay | Admitting: Adult Health Nurse Practitioner

## 2019-12-13 ENCOUNTER — Ambulatory Visit (INDEPENDENT_AMBULATORY_CARE_PROVIDER_SITE_OTHER): Payer: Medicare Other | Admitting: Adult Health Nurse Practitioner

## 2019-12-13 VITALS — BP 116/78 | HR 66 | Temp 97.6°F | Wt 127.0 lb

## 2019-12-13 DIAGNOSIS — M76892 Other specified enthesopathies of left lower limb, excluding foot: Secondary | ICD-10-CM | POA: Diagnosis not present

## 2019-12-13 DIAGNOSIS — R42 Dizziness and giddiness: Secondary | ICD-10-CM | POA: Diagnosis not present

## 2019-12-13 DIAGNOSIS — K219 Gastro-esophageal reflux disease without esophagitis: Secondary | ICD-10-CM | POA: Diagnosis not present

## 2019-12-13 DIAGNOSIS — Z6824 Body mass index (BMI) 24.0-24.9, adult: Secondary | ICD-10-CM

## 2019-12-13 DIAGNOSIS — R7309 Other abnormal glucose: Secondary | ICD-10-CM | POA: Diagnosis not present

## 2019-12-13 DIAGNOSIS — M76891 Other specified enthesopathies of right lower limb, excluding foot: Secondary | ICD-10-CM

## 2019-12-13 DIAGNOSIS — F3341 Major depressive disorder, recurrent, in partial remission: Secondary | ICD-10-CM

## 2019-12-13 DIAGNOSIS — E559 Vitamin D deficiency, unspecified: Secondary | ICD-10-CM | POA: Diagnosis not present

## 2019-12-13 DIAGNOSIS — I10 Essential (primary) hypertension: Secondary | ICD-10-CM | POA: Diagnosis not present

## 2019-12-13 DIAGNOSIS — J4540 Moderate persistent asthma, uncomplicated: Secondary | ICD-10-CM

## 2019-12-13 DIAGNOSIS — E039 Hypothyroidism, unspecified: Secondary | ICD-10-CM

## 2019-12-13 DIAGNOSIS — R109 Unspecified abdominal pain: Secondary | ICD-10-CM | POA: Diagnosis not present

## 2019-12-13 MED ORDER — PREDNISONE 10 MG (21) PO TBPK: ORAL_TABLET | Freq: Every day | ORAL | 0 refills | Status: DC

## 2019-12-13 NOTE — Patient Instructions (Addendum)
We wlil contact you via My Chart with your lab results.  We are going to send in a prednisone taper for you to take for your knees.  Take this with food as directed on taper pack.  Freeze a water bottles and ice the back of your knees 3-4 times a day.   Take a Probiotic suplement daily such as Digestive Advantage or Align. OR yogurt with probiotics such as Activia.  This can help with gas and bloating.   Please go to the ER if you have any severe AB pain, unable to hold down food/water, blood in stool or vomit, chest pain, shortness of breath, or any worsening symptoms.   Consider keeping a food diary- common causes of diarrhea are dairy, certain carbs...  FODMAP stands for fermentable oligo-, di-, mono-saccharides and polyols (1). These are the scientific terms used to classify groups of carbs that are notorious for triggering digestive symptoms like bloating, gas and stomach pain.   FODMAPs are found in a wide range of foods in varying amounts. Some foods contain just one type, while others contain several.  The main dietary sources of the four groups of FODMAPs include:  Oligosaccharides: Wheat, rye, legumes and various fruits and vegetables, such as garlic and onions.  Disaccharides: Milk, yogurt and soft cheese. Lactose is the main carb.  Monosaccharides: Various fruit including figs and mangoes, and sweeteners such as honey and agave nectar. Fructose is the main carb.  Polyols: Certain fruits and vegetables including blackberries and lychee, as well as some low-calorie sweeteners like those in sugar-free gum.   Keep a food diary. This will help you identify foods that cause symptoms. Write down: ? What you eat and when. ? What symptoms you have. ? When symptoms occur in relation to your meals.  Avoid foods that cause symptoms. Talk with your dietitian about other ways to get the same nutrients that are in these foods.  Eat your meals slowly, in a relaxed setting.  Aim to  eat 5-6 small meals per day. Do not skip meals.  Drink enough fluids to keep your urine clear or pale yellow.  Ask your health care provider if you should take an over-the-counter probiotic during flare-ups to help restore healthy gut bacteria.  If you have cramping or diarrhea, try making your meals low in fat and high in carbohydrates. Examples of carbohydrates are pasta, rice, whole grain breads and cereals, fruits, and vegetables.  If dairy products cause your symptoms to flare up, try eating less of them. You might be able to handle yogurt better than other dairy products because it contains bacteria that help with digestion.

## 2019-12-13 NOTE — Progress Notes (Signed)
FOLLOW UP Assessment:   Diagnoses and all orders for this visit:  Tendonitis of both knees QQ:[id number] taper pak discussed medication and side effects.  Take with food Discussed ice area and stretching  Essential hypertension Continue current medications: levothyroxine 50mcg daily Monitor blood pressure at home; call if consistently over 130/80 Continue DASH diet.   Reminder to go to the ER if any CP, SOB, nausea, dizziness, severe HA, changes vision/speech, left arm numbness and tingling and jaw pain.  Hypothyroidism, unspecified type continue medications the same pending lab results reminded to take on an empty stomach 30-22mins before food.  check TSH level  Moderate persistent extrinsic asthma without complication Doing well Avoid triggers, pollen, heavy now has not been outside much related to this. Using Pulmicort neb daily Using Perforomist neb BID Receiving monthly injections, Xolair 150mg   Depression, major, recurrent, in partial remission ([place]) Doing well on current regimen Continue Zoloft with benefit  Hyperlipidemia, unspecified hyperlipidemia type Stop simvastatin foro now related to myalgias?  LDL goal <100 Will check lipids next visit  Vertigo Doing well at this time Unknown triggers Has meclizine PRN  Vitamin D deficiency Continue supplimention Will check labs next visit  Other abnormal glucose Discussed dietary and exercise modifications  BMI 24.0-24.9, adult Discussed dietary and exercise modifications  Hearing loss  Needs hearing aids but patient feels limited need at this time; monitoring;  Declines any referral at this time   GERD Taking tums every evening  Abdominal cramping Discussed dietary modifications, decrease fast food Discussed probiotic daily to help with bloating cramping Monitor symptoms Consider famotidine daily? Contact office with any new or worsening symptoms    Follow Up Instructions:   I discussed the  assessment and treatment plan with the patient. The patient was provided an opportunity to ask questions and all were answered. The patient agreed with the plan and demonstrated an understanding of the instructions.   The patient was advised to call back or seek an in-person evaluation if the symptoms worsen or if the condition fails to improve as anticipated.  I provided 30 minutes of face-to-face time during this encounter including counseling, chart review, and critical decision making was performed.  Future Appointments  Date Time Provider [place]  12/27/2019  1:30 PM [facility] None  06/12/2020  3:00 PM [name], MD [facility] None  09/14/2020  2:00 PM Liane Comber, NP [facility] None    Subjective:  Kathy Whitaker is a 86 y.o. female who presents for follow up for HLD, hypothyroidism, abnormal glucose, depression and vitamin D Def.   She reports that she is having leg pain.  Bilaterally behind her knees.  The left is worse than the right.  She stopped the simvastatin about on month ago to see if that would help. No change in her level of pain.  It is increasing painful at night when she lays down.  She take two a day, max, to help.   It does help with the pain.  She reports pain with initiation of walking, dull in quality.   She report before her husband went into [place]..  She reports she had a fall over the stair lift and ever since then she has had problems.  She did not have evaluation after this.   On zoloft 50 mg daily for mood and feels symptoms are currently well controlled.    Patient has history of Asthma/allergies, follows with Dr. [name]. She is on pulmicort/performist once a day, combivent PRN, 2-3 times  per month, and claritin and xolair injections that have helped significantly. Cold weather can be problematic but doing well recently.   Following with neurologist, Dr. Krista Blue, for vertigo. MRI on 12/14/2016 was unremarkable excepting  age-appropriate microvascular changes. She reports symptoms have since improved, meclizine does improve symptoms significantly.   She reports that she is having constant stomach cramps, lower abdomen, over the course of the past month.  It is continuous and she get bloated, worse after eating and when she wakes she fells like she needs to have multiple.  The is having approx three BM's a day and they are Type 4 a day.  She reports now she does live by herself and she is eating more fast food but not excessive.  She is drinking about 6 glasses of water a day.   She has been taking tums every night.  Denies nausea or vomiting. She has not taken anything for this.  BMI is Body mass index is 25.22 kg/m., she has been working on diet , repots generally active but no current intentional exercise, plans to start walking.  Wt Readings from Last 3 Encounters:  12/13/19 127 lb (57.6 kg)  11/17/19 127 lb (57.6 kg)  10/18/19 126 lb (57.2 kg)   Her blood pressure has been controlled at home, today their BP is BP: 116/78 She denies chest pain, shortness of breath.   She is on cholesterol medication  (simvastatin 40 mg daily) and denies myalgias. Her cholesterol is at goal. The cholesterol last visit was:   Lab Results  Component Value Date   CHOL 175 09/08/2019   HDL 74 09/08/2019   LDLCALC 82 09/08/2019   TRIG 101 09/08/2019   CHOLHDL 2.4 09/08/2019   She has not been working on diet and exercise for abnormal glucose, and denies hyperglycemia, hypoglycemia , increased appetite, nausea, paresthesia of the feet, polydipsia, polyuria, visual disturbances and vomiting. Last A1C in the office was:  Lab Results  Component Value Date   HGBA1C 5.2 06/08/2019   Last GFR Lab Results  Component Value Date   GFRNONAA 75 09/08/2019   She is on thyroid medication. Her medication was not changed last visit.   Lab Results  Component Value Date   TSH 3.13 09/08/2019    Patient is on Vitamin D supplement.    Lab Results  Component Value Date   VD25OH 57 06/08/2019      Medication Review:  Current Outpatient Medications (Endocrine & Metabolic):    levothyroxine (SYNTHROID) 75 MCG tablet, Take 1 tablet daily on an empty stomach with only water for 30 minutes & no Antacid meds, Calcium or Magnesium for 4 hours & avoid Biotin   Current Outpatient Medications (Cardiovascular):    EPINEPHrine (EPIPEN JR) 0.15 MG/0.3ML injection, Inject 0.15 mg into the muscle as needed.     Current Outpatient Medications (Respiratory):    budesonide (PULMICORT) 0.25 MG/2ML nebulizer solution, Take 2 mLs (0.25 mg total) by nebulization daily. Once daily and will take again if needed   formoterol (PERFOROMIST) 20 MCG/2ML nebulizer solution, Take 2 mLs (20 mcg total) by nebulization 2 (two) times daily.   Ipratropium-Albuterol (COMBIVENT RESPIMAT) 20-100 MCG/ACT AERS respimat, USE 1 INHALATION EVERY 6 HOURS AS NEEDED FOR WHEEZING   loratadine (CLARITIN) 10 MG tablet, Take 1 tablet (10 mg total) by mouth daily as needed for allergies.  Current Facility-Administered Medications (Respiratory):    omalizumab Arvid Right) injection 150 mg   omalizumab Arvid Right) injection 150 mg  Current Outpatient Medications (Analgesics):  aspirin EC 81 MG tablet, Take 81 mg by mouth daily.     Current Outpatient Medications (Other):    Ascorbic Acid (VITAMIN C PO), Take 500 mg by mouth daily.   Cholecalciferol (VITAMIN D PO), Take 5,000 Units by mouth daily.   meclizine (ANTIVERT) 25 MG tablet, Take 1/2 to 1 tablet 3 x /day as needed for dizziness / Vertigo   sertraline (ZOLOFT) 50 MG tablet, Take 1 tablet daily for mood.   triamcinolone ointment (KENALOG) 0.1 %, Apply 1 application topically 2 (two) times daily.   zinc gluconate 50 MG tablet, Take 50 mg by mouth daily.   Allergies: Allergies  Allergen Reactions   Codeine Nausea Only   Propoxyphene N-Acetaminophen Nausea Only   Shellfish-Derived Products  Rash    Current Problems (verified) has Extrinsic asthma; GERD; Other abnormal glucose; Vitamin D deficiency; Hypothyroidism; Hyperlipemia; Essential hypertension; Medication management; BMI 24.0-24.9, adult; Encounter for Medicare annual wellness exam; Vertigo; Hearing loss; Depression, major, recurrent, in partial remission ([place]); and Osteoporosis on their problem list.  Screening Tests Immunization History  Administered Date(s) Administered   Influenza Whole 10/05/2009, 11/26/2010, 11/05/2011   Influenza, High Dose Seasonal PF 11/20/2012, 11/22/2013, 11/21/2014, 11/10/2015, 11/19/2016, 11/20/2017, 11/10/2018, 11/17/2019   PFIZER SARS-COV-2 Vaccination 04/04/2019, 04/28/2019   Pneumococcal Conjugate-13 11/22/2013   Pneumococcal Polysaccharide-23 09/14/2008   Td 10/09/2004, 03/12/2017   Zoster 02/15/2013    Preventative care: Last colonoscopy: 2015 Mammogram: 2018, DUE? Gets q2y by Dr Velda Shell, did see him last year and believes had then will check, report requested DEXA: GYN follows, report requested   Prior vaccinations:  TD or Tdap: 2019  Influenza: 11/2018 Pneumococcal: 2010 Prevnar13: 2015 Shingles/Zostavax: 2015 Covid 19: 2/2, 2021, pfizer  Names of Other Physician/Practitioners you currently use: 1. [facility] here for primary care 2. Eye Exam: Dr. Katy Fitch, last 2021, just had R cataract, will had L in 2 months  3. Dential Exam: 2021, goes q25m  Patient Care Team: [name], MD as PCP - General (Internal Medicine) Elsie Stain, MD (Pulmonary Disease) Arvella Nigh, MD as Consulting Physician (Obstetrics and Gynecology)  Surgical: She  has a past surgical history that includes Tubal ligation (Bilateral, 1970); Tonsillectomy; Wisdom tooth extraction; Dilatation & currettage/hysteroscopy with resectoscope (N/A, 09/10/2013); and Cataract extraction (Right, 09/03/2019). Family Her family history includes Cancer in her  brother; Gout in her brother; Hodgkin's lymphoma in her brother; Other in her father; Thyroid disease in her mother. Social history  She reports that she has never smoked. She has never used smokeless tobacco. She reports current alcohol use. She reports that she does not use drugs.   Objective:   Today's Vitals   12/13/19 1614  BP: 116/78  Pulse: 66  Temp: 97.6 F (36.4 C)  SpO2: 97%  Weight: 127 lb (57.6 kg)   Body mass index is 25.22 kg/m.  General Appearance: Well nourished, in no apparent distress. Eyes: PERRLA, EOMs, conjunctiva no swelling or erythema Sinuses: No Frontal/maxillary tenderness ENT/Mouth: Ext aud canals clear, TMs without erythema, bulging. No erythema, swelling, or exudate on post pharynx.  Tonsils not swollen or erythematous. Hearing normal.  Neck: Supple, thyroid normal.  Respiratory: Respiratory effort normal, BS equal bilaterally without rales, rhonchi, wheezing or stridor.  Cardio: RRR with no MRGs. Brisk peripheral pulses without edema.  Abdomen: Soft, + BS.  Non tender, no guarding, rebound, hernias, masses. Lymphatics: Non tender without lymphadenopathy.  Musculoskeletal: Full ROM, no effusions, 5/5 strength, Normal gait.   Point tenderness PCL,  bilaterally. Skin: Warm, dry without rashes, lesions, ecchymosis.  Neuro: Cranial nerves intact. No cerebellar symptoms.  Psych: Awake and oriented X 3, normal affect, Insight and Judgment appropriate.     Garnet Sierras, Laqueta Jean, DNP [facility] 12/13/2019  5:16 PM

## 2019-12-13 NOTE — Progress Notes (Deleted)
FOLLOW UP 3 MONTH    Assessment:   Diagnoses and all orders for this visit:   Moderate persistent extrinsic asthma without complication Doing well Avoid triggers, pollen, heavy now has not been outside much related to this. Using Pulmicort neb daily Using Perforomist neb BID Receiving monthly injections, Xolair 150mg   Depression, major, recurrent, in partial remission ([place]) Doing well on current regimen Continue Zoloft with benefit  Hyperlipidemia, unspecified hyperlipidemia type Continue statin; LDL goal <100 Will check lipids next visit  Hypothyroidism, unspecified type continue medications the same pending lab results reminded to take on an empty stomach 30-53mins before food.  check TSH level  Vertigo Doing well at this time Unknown triggers Has meclizine PRN  Vitamin D deficiency Continue supplimention Will check labs next visit  Other abnormal glucose Discussed dietary and exercise modifications  BMI 24.0-24.9, adult Discussed dietary and exercise modifications  Hearing loss  Needs hearing aids but patient feels limited need at this time; monitoring;  Declines any referral at this time   Follow Up Instructions:   I discussed the assessment and treatment plan with the patient. The patient was provided an opportunity to ask questions and all were answered. The patient agreed with the plan and demonstrated an understanding of the instructions.   The patient was advised to call back or seek an in-person evaluation if the symptoms worsen or if the condition fails to improve as anticipated.  I provided 30 minutes of face-to-face time during this encounter including counseling, chart review, and critical decision making was performed.  Future Appointments  Date Time Provider [place]  12/13/2019  2:00 PM Garnet Sierras, NP [facility] None  12/27/2019  1:30 PM [facility] None  06/12/2020  3:00 PM [name], MD  [facility] None  09/14/2020  2:00 PM Liane Comber, NP [facility] None     Subjective:  Kathy Whitaker is a 86 y.o. female who presents for Medicare Annual Wellness Visit and 3 month follow up for hypothyroidism, hyperlipidemia, abnormal glucose, depression and vitamin D Def.   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 On zoloft 50 mg daily for mood and feels symptoms are currently well controlled.    Patient has history of Asthma/allergies, follows with Dr. [name]. She is on pulmicort/performist once a day, combivent PRN, 2-3 times per month, and claritin and xolair injections that have helped significantly. Cold weather can be problematic but doing well recently.   Following with neurologist, Dr. Krista Blue, for vertigo. MRI on 12/14/2016 was unremarkable excepting age-appropriate microvascular changes. She reports symptoms have since improved, meclizine does improve symptoms significantly.   BMI is There is no height or weight on file to calculate BMI., she has been working on diet , repots generally active but no current intentional exercise, plans to start walking.  Wt Readings from Last 3 Encounters:  11/17/19 127 lb (57.6 kg)  10/18/19 126 lb (57.2 kg)  09/08/19 128 lb (58.1 kg)   Her blood pressure has been controlled at home, today their BP is   She denies chest pain, shortness of breath.   She is on cholesterol medication  (simvastatin 40 mg daily) and denies myalgias. Her cholesterol is at goal. The cholesterol last visit was:   Lab Results  Component Value Date   CHOL 175 09/08/2019   HDL 74 09/08/2019   LDLCALC 82 09/08/2019   TRIG 101 09/08/2019   CHOLHDL 2.4 09/08/2019   She  has not been working on diet and exercise for abnormal glucose, and denies hyperglycemia, hypoglycemia , increased appetite, nausea, paresthesia of the feet, polydipsia, polyuria, visual disturbances and vomiting. Last A1C in the  office was:  Lab Results  Component Value Date   HGBA1C 5.2 06/08/2019   Last GFR Lab Results  Component Value Date   GFRNONAA 75 09/08/2019   She is on thyroid medication. Her medication was not changed last visit.   Lab Results  Component Value Date   TSH 3.13 09/08/2019    Patient is on Vitamin D supplement.   Lab Results  Component Value Date   VD25OH 57 06/08/2019      Medication Review:  Current Outpatient Medications (Endocrine & Metabolic):  .  levothyroxine (SYNTHROID) 75 MCG tablet, Take 1 tablet daily on an empty stomach with only water for 30 minutes & no Antacid meds, Calcium or Magnesium for 4 hours & avoid Biotin   Current Outpatient Medications (Cardiovascular):  [name]  EPINEPHrine (EPIPEN JR) 0.15 MG/0.3ML injection, Inject 0.15 mg into the muscle as needed.   .  simvastatin (ZOCOR) 40 MG tablet, Take 1 tablet daily for cholesterol.   Current Outpatient Medications (Respiratory):  .  budesonide (PULMICORT) 0.25 MG/2ML nebulizer solution, Take 2 mLs (0.25 mg total) by nebulization daily. Once daily and will take again if needed .  formoterol (PERFOROMIST) 20 MCG/2ML nebulizer solution, Take 2 mLs (20 mcg total) by nebulization 2 (two) times daily. .  Ipratropium-Albuterol (COMBIVENT RESPIMAT) 20-100 MCG/ACT AERS respimat, USE 1 INHALATION EVERY 6 HOURS AS NEEDED FOR WHEEZING .  loratadine (CLARITIN) 10 MG tablet, Take 1 tablet (10 mg total) by mouth daily as needed for allergies.  Current Facility-Administered Medications (Respiratory):  .  omalizumab Arvid Right) injection 150 mg .  omalizumab Arvid Right) injection 150 mg  Current Outpatient Medications (Analgesics):  .  aspirin EC 81 MG tablet, Take 81 mg by mouth daily.     Current Outpatient Medications (Other):  [name]  Ascorbic Acid (VITAMIN C PO), Take 500 mg by mouth daily. .  Cholecalciferol (VITAMIN D PO), Take 5,000 Units by mouth daily. .  meclizine (ANTIVERT) 25 MG tablet, Take 1/2 to 1 tablet 3 x /day  as needed for dizziness / Vertigo .  sertraline (ZOLOFT) 50 MG tablet, Take 1 tablet daily for mood. .  triamcinolone ointment (KENALOG) 0.1 %, Apply 1 application topically 2 (two) times daily. [name]  zinc gluconate 50 MG tablet, Take 50 mg by mouth daily.   Allergies: Allergies  Allergen Reactions  . Codeine Nausea Only  . Propoxyphene N-Acetaminophen Nausea Only  . Shellfish-Derived Products Rash    Current Problems (verified) has Extrinsic asthma; GERD; Other abnormal glucose; Vitamin D deficiency; Hypothyroidism; Hyperlipemia; Essential hypertension; Medication management; BMI 24.0-24.9, adult; Encounter for Medicare annual wellness exam; Vertigo; Hearing loss; Depression, major, recurrent, in partial remission ([place]); and Osteoporosis on their problem list.  Screening Tests Immunization History  Administered Date(s) Administered  . Influenza Whole 10/05/2009, 11/26/2010, 11/05/2011  . Influenza, High Dose Seasonal PF 11/20/2012, 11/22/2013, 11/21/2014, 11/10/2015, 11/19/2016, 11/20/2017, 11/10/2018, 11/17/2019  . PFIZER SARS-COV-2 Vaccination 04/04/2019, 04/28/2019  . Pneumococcal Conjugate-13 11/22/2013  . Pneumococcal Polysaccharide-23 09/14/2008  . Td 10/09/2004, 03/12/2017  . Zoster 02/15/2013    Preventative care: Last colonoscopy: 2015 Mammogram: 2018, DUE? Gets q2y by Dr Velda Shell, did see him last year and believes had then will check, report requested DEXA: GYN follows, report requested   Prior vaccinations:  TD or Tdap: 2019  Influenza: 11/2018 Pneumococcal:  2010 Prevnar13: 2015 Shingles/Zostavax: 2015 Covid 19: 2/2, 2021, pfizer  Names of Other Physician/Practitioners you currently use: 1. [facility] here for primary care 2. Eye Exam: Dr. Katy Fitch, last 2021, just had R cataract, will had L in 2 months  3. Dential Exam: 2021, goes q74m  Patient Care Team: [name], MD as PCP - General (Internal Medicine) Elsie Stain, MD (Pulmonary Disease) Arvella Nigh, MD as Consulting Physician (Obstetrics and Gynecology)  Surgical: She  has a past surgical history that includes Tubal ligation (Bilateral, 1970); Tonsillectomy; Wisdom tooth extraction; Dilatation & currettage/hysteroscopy with resectoscope (N/A, 09/10/2013); and Cataract extraction (Right, 09/03/2019). Family Her family history includes Cancer in her brother; Gout in her brother; Hodgkin's lymphoma in her brother; Other in her father; Thyroid disease in her mother. Social history  She reports that she has never smoked. She has never used smokeless tobacco. She reports current alcohol use. She reports that she does not use drugs.   Objective:   There were no vitals filed for this visit. There is no height or weight on file to calculate BMI.  General Appearance: Well nourished, in no apparent distress. Eyes: PERRLA, EOMs, conjunctiva no swelling or erythema Sinuses: No Frontal/maxillary tenderness ENT/Mouth: Ext aud canals clear, TMs without erythema, bulging. No erythema, swelling, or exudate on post pharynx.  Tonsils not swollen or erythematous. Hearing normal.  Neck: Supple, thyroid normal.  Respiratory: Respiratory effort normal, BS equal bilaterally without rales, rhonchi, wheezing or stridor.  Cardio: RRR with no MRGs. Brisk peripheral pulses without edema.  Abdomen: Soft, + BS.  Non tender, no guarding, rebound, hernias, masses. Lymphatics: Non tender without lymphadenopathy.  Musculoskeletal: Full ROM, no effusions, 5/5 strength, Normal gait Skin: Warm, dry without rashes, lesions, ecchymosis.  Neuro: Cranial nerves intact. No cerebellar symptoms.  Psych: Awake and oriented X 3, normal affect, Insight and Judgment appropriate.    Garnet Sierras, Laqueta Jean, DNP [facility] 12/13/2019  1:48 PM

## 2019-12-15 LAB — CBC WITH DIFFERENTIAL/PLATELET
Absolute Monocytes: 632 cells/uL (ref 200–950)
Basophils Absolute: 88 cells/uL (ref 0–200)
Basophils Relative: 1.3 %
Eosinophils Absolute: 190 cells/uL (ref 15–500)
Eosinophils Relative: 2.8 %
HCT: 39.4 % (ref 35.0–45.0)
Hemoglobin: 12.7 g/dL (ref 11.7–15.5)
Lymphs Abs: 1979 cells/uL (ref 850–3900)
MCH: 29.3 pg (ref 27.0–33.0)
MCHC: 32.2 g/dL (ref 32.0–36.0)
MCV: 91 fL (ref 80.0–100.0)
MPV: 11.1 fL (ref 7.5–12.5)
Monocytes Relative: 9.3 %
Neutro Abs: 3910 cells/uL (ref 1500–7800)
Neutrophils Relative %: 57.5 %
Platelets: 269 10*3/uL (ref 140–400)
RBC: 4.33 10*6/uL (ref 3.80–5.10)
RDW: 13.2 % (ref 11.0–15.0)
Total Lymphocyte: 29.1 %
WBC: 6.8 10*3/uL (ref 3.8–10.8)

## 2019-12-15 LAB — COMPLETE METABOLIC PANEL WITH GFR
AG Ratio: 1.6 (calc) (ref 1.0–2.5)
ALT: 9 U/L (ref 6–29)
AST: 16 U/L (ref 10–35)
Albumin: 4.1 g/dL (ref 3.6–5.1)
Alkaline phosphatase (APISO): 78 U/L (ref 37–153)
BUN: 16 mg/dL (ref 7–25)
CO2: 32 mmol/L (ref 20–32)
Calcium: 10 mg/dL (ref 8.6–10.4)
Chloride: 102 mmol/L (ref 98–110)
Creat: 0.83 mg/dL (ref 0.60–0.88)
GFR, Est African American: 75 mL/min/{1.73_m2} (ref >=60)
GFR, Est Non African American: 65 mL/min/{1.73_m2} (ref >=60)
Globulin: 2.6 g/dL (calc) (ref 1.9–3.7)
Glucose, Bld: 113 mg/dL — ABNORMAL HIGH (ref 65–99)
Potassium: 4.4 mmol/L (ref 3.5–5.3)
Sodium: 140 mmol/L (ref 135–146)
Total Bilirubin: 0.3 mg/dL (ref 0.2–1.2)
Total Protein: 6.7 g/dL (ref 6.1–8.1)

## 2019-12-15 LAB — TSH: TSH: 5.21 mIU/L — ABNORMAL HIGH (ref 0.40–4.50)

## 2019-12-15 LAB — TEST AUTHORIZATION

## 2019-12-17 ENCOUNTER — Telehealth: Payer: Self-pay | Admitting: Internal Medicine

## 2019-12-17 NOTE — Telephone Encounter (Signed)
Xolair Prefilled Syringe Order: 150mg  Prefilled Syringe:  #1 75mg  Prefilled Syringe: #n/a Ordered Date: 12/17/19 Expected date of arrival: 12/21/19 Ordered by: [place]: Nigel Mormon

## 2019-12-20 NOTE — Telephone Encounter (Signed)
Xolair Prefilled Syringe Received:  150mg  Prefilled Syringe >> quantity #1, lot # [id number], exp date 01/03/2021 75mg  Prefilled Syringe >> quantity #n/a Medication arrival date: 12/20/19 Received by: Fathima Bartl,LPN

## 2019-12-21 ENCOUNTER — Other Ambulatory Visit: Payer: Self-pay

## 2019-12-21 ENCOUNTER — Ambulatory Visit (INDEPENDENT_AMBULATORY_CARE_PROVIDER_SITE_OTHER): Payer: Medicare Other | Admitting: Internal Medicine

## 2019-12-21 ENCOUNTER — Encounter: Payer: Self-pay | Admitting: Internal Medicine

## 2019-12-21 VITALS — BP 128/72 | HR 64 | Temp 97.1°F | Resp 16 | Ht 59.5 in | Wt 126.6 lb

## 2019-12-21 DIAGNOSIS — R1084 Generalized abdominal pain: Secondary | ICD-10-CM

## 2019-12-21 MED ORDER — DICYCLOMINE HCL 20 MG PO TABS: ORAL_TABLET | ORAL | 0 refills | Status: DC

## 2019-12-21 NOTE — Progress Notes (Signed)
   History of Present Illness:     Patient is a very nice 86 yo MWM presenting with intermittent c/o mid abdominal cramping discomfort. Sometimes sx's precipitated by eating. Denies N/V, D/C of blood in BM's.   Medications  Current Outpatient Medications (Endocrine & Metabolic):  .  levothyroxine (SYNTHROID) 75 MCG tablet, Take 1 tablet daily on an empty stomach with only water for 30 minutes & no Antacid meds, Calcium or Magnesium for 4 hours & avoid Biotin .  predniSONE 21 TAB - 10 MG (21) , Take by mouth daily .  EPINEPHrine (EPIPEN JR) 0.15 MG/0.3ML injection, Inject 0.15 mg into the muscle as needed.   .  budesonide (PULMICORT) 0.25 MG/2ML nebulizer solution, Take 2 mLs (0.25 mg total) by nebulization daily. Once daily and will take again if needed .  formoterol (PERFOROMIST) 20 MCG/2ML nebulizer solution, Take 2 mLs (20 mcg total) by nebulization 2 (two) times daily. .  Ipratropium-Albuterol (COMBIVENT RESPIMAT)  respimat, USE 1 INHALATION EVERY 6 HOURS AS NEEDED FOR WHEEZING .  loratadine  10 MG tablet, Take 1 tablet daily as needed for allergies. [name]  omalizumab Arvid Right) injection 150 mg .  aspirin EC 81 MG tablet, Take 81 mg by mouth daily. .  Ascorbic Acid (VITAMIN C PO), Take 500 mg by mouth daily. .  Cholecalciferol (VITAMIN D PO), Take 5,000 Units by mouth daily. .  Meclizine 25 MG tablet, Take 1/2 to 1 tablet 3 x /day as needed for dizziness / Vertigo .  sertraline (ZOLOFT) 50 MG tablet, Take 1 tablet daily for mood. .  triamcinolone ointment (KENALOG) 0.1 %, Apply 1 application topically 2 (two) times daily. [name]  zinc gluconate 50 MG tablet, Take 50 mg by mouth daily.   Problem list She has Extrinsic asthma; GERD; Other abnormal glucose; Vitamin D deficiency; Hypothyroidism; Hyperlipemia; Essential hypertension; Medication management; BMI 24.0-24.9, adult; Encounter for Medicare annual wellness exam; Vertigo; Hearing loss; Depression, major, recurrent, in partial remission ([place]);  and Osteoporosis on their problem list.   Observations/Objective:  BP 128/72   Pulse 64   Temp (!) 97.1 F (36.2 C)   Resp 16   Ht 4' 11.5" (1.511 m)   Wt 126 lb 9.6 oz (57.4 kg)   SpO2 95%   BMI 25.14 kg/m   HEENT - WNL. Neck - supple.  Chest - Clear equal BS. Cor - Nl HS. RRR w/o sig MGR. PP 1(+). No edema. Abd - Soft sl protuberant w/o masses or significant tenderness MS- FROM w/o deformities.  Gait Nl. Neuro -  Nl w/o focal abnormalities.  Assessment and Plan:   1. Generalized abdominal pain  - dicyclomine (BENTYL) 20 MG tablet; Take     1/2 to 1 tablet     4 x /day      before Meals & Bedtime        for Abdominal Cramping , Bloating or Nausea  Dispense: 120 tablet; Refill: 0 - Discussed high fiber diet   Follow Up Instructions:      I discussed the assessment and treatment plan with the patient. The patient was provided an opportunity to ask questions and all were answered. The patient agreed with the plan and demonstrated an understanding of the instructions.   T   he patient was advised to call back or seek an in-person evaluation if the symptoms worsen or if the condition fails to improve as anticipated.    Kirtland Bouchard, MD

## 2019-12-22 ENCOUNTER — Other Ambulatory Visit: Payer: Self-pay | Admitting: *Deleted

## 2019-12-22 DIAGNOSIS — R1084 Generalized abdominal pain: Secondary | ICD-10-CM

## 2019-12-22 MED ORDER — DICYCLOMINE HCL 20 MG PO TABS: ORAL_TABLET | ORAL | 0 refills | Status: DC

## 2019-12-27 ENCOUNTER — Other Ambulatory Visit: Payer: Self-pay

## 2019-12-27 ENCOUNTER — Ambulatory Visit (INDEPENDENT_AMBULATORY_CARE_PROVIDER_SITE_OTHER): Payer: Medicare Other

## 2019-12-27 DIAGNOSIS — J4540 Moderate persistent asthma, uncomplicated: Secondary | ICD-10-CM | POA: Diagnosis not present

## 2019-12-27 MED ORDER — OMALIZUMAB 150 MG/ML ~~LOC~~ SOSY
150.0000 mg | PREFILLED_SYRINGE | Freq: Once | SUBCUTANEOUS | Status: AC
  Administered 2019-12-27: 150 mg via SUBCUTANEOUS

## 2019-12-27 NOTE — Progress Notes (Signed)
Have you been hospitalized within the last 10 days?  No Do you have a fever?  No Do you have a cough?  No Do you have a headache or sore throat? No Do you have your Epi Pen visible and is it within date?  Yes 

## 2020-01-12 ENCOUNTER — Other Ambulatory Visit: Payer: Medicare Other

## 2020-01-17 ENCOUNTER — Telehealth: Payer: Self-pay | Admitting: Internal Medicine

## 2020-01-17 NOTE — Telephone Encounter (Signed)
Xolair Prefilled Syringe Order: 150mg  Prefilled Syringe:  #1 75mg  Prefilled Syringe: #n/a Ordered Date: 01/17/20 Expected date of arrival: 01/18/20 Ordered by: [place]: Kathy Whitaker

## 2020-01-18 NOTE — Telephone Encounter (Signed)
Xolair Prefilled Syringe Received:  150mg  Prefilled Syringe >> quantity #1, lot # [id number], exp date 01/03/2021 75mg  Prefilled Syringe >> quantity #n/a Medication arrival date: 01/18/20 Received by: Kennet Mccort,LPN

## 2020-01-25 ENCOUNTER — Other Ambulatory Visit: Payer: Self-pay

## 2020-01-25 ENCOUNTER — Ambulatory Visit (INDEPENDENT_AMBULATORY_CARE_PROVIDER_SITE_OTHER): Payer: Medicare Other

## 2020-01-25 DIAGNOSIS — J4540 Moderate persistent asthma, uncomplicated: Secondary | ICD-10-CM | POA: Diagnosis not present

## 2020-01-25 MED ORDER — OMALIZUMAB 150 MG/ML ~~LOC~~ SOSY
150.0000 mg | PREFILLED_SYRINGE | Freq: Once | SUBCUTANEOUS | Status: AC
  Administered 2020-01-25: 14:00:00 150 mg via SUBCUTANEOUS

## 2020-01-25 NOTE — Progress Notes (Signed)
Have you been hospitalized within the last 10 days?  No Do you have a fever?  No Do you have a cough?  No Do you have a headache or sore throat? No  

## 2020-02-14 ENCOUNTER — Telehealth: Payer: Self-pay | Admitting: Internal Medicine

## 2020-02-14 NOTE — Telephone Encounter (Signed)
Xolair Prefilled Syringe Order: 150mg  Prefilled Syringe:  #1 75mg  Prefilled Syringe: #n/a Ordered Date: 02/14/20 Expected date of arrival: 02/15/20 Ordered by: [place]: Nigel Mormon

## 2020-02-15 NOTE — Telephone Encounter (Signed)
Xolair Prefilled Syringe Received:  150mg  Prefilled Syringe >> quantity #1, lot # [id number], exp date 10/04/2020 75mg  Prefilled Syringe >> quantity #n/a Medication arrival date: 02/15/20 Received by: Skarlett Sedlacek,LPN

## 2020-02-17 ENCOUNTER — Other Ambulatory Visit: Payer: Self-pay | Admitting: Internal Medicine

## 2020-02-17 DIAGNOSIS — J4530 Mild persistent asthma, uncomplicated: Secondary | ICD-10-CM

## 2020-02-24 ENCOUNTER — Other Ambulatory Visit: Payer: Self-pay

## 2020-02-24 ENCOUNTER — Telehealth: Payer: Self-pay | Admitting: Internal Medicine

## 2020-02-24 ENCOUNTER — Ambulatory Visit (INDEPENDENT_AMBULATORY_CARE_PROVIDER_SITE_OTHER): Payer: Medicare Other

## 2020-02-24 ENCOUNTER — Ambulatory Visit: Payer: Medicare Other

## 2020-02-24 DIAGNOSIS — J4540 Moderate persistent asthma, uncomplicated: Secondary | ICD-10-CM

## 2020-02-24 MED ORDER — OMALIZUMAB 150 MG/ML ~~LOC~~ SOSY
150.0000 mg | PREFILLED_SYRINGE | Freq: Once | SUBCUTANEOUS | Status: AC
  Administered 2020-02-24: 150 mg via SUBCUTANEOUS

## 2020-02-24 NOTE — Progress Notes (Signed)
Have you been hospitalized within the last 10 days?  No Do you have a fever?  No Do you have a cough?  No Do you have a headache or sore throat? No  

## 2020-02-24 NOTE — Telephone Encounter (Signed)
Called and spoke with Patient. Patient asked if she could come by at 2pm to receive Xolair injection.  Patient's husband passed and she will be making arrangements with funeral home at 81 today. Patient placed on schedule at 2pm today for Xolair injection.

## 2020-03-20 ENCOUNTER — Telehealth: Payer: Self-pay | Admitting: Internal Medicine

## 2020-03-20 NOTE — Telephone Encounter (Signed)
Xolair Prefilled Syringe Order: 150mg  Prefilled Syringe:  #1 75mg  Prefilled Syringe: #n/a Ordered Date: 03/20/20 Expected date of arrival: 03/21/20 Ordered by: [place]: Nigel Mormon

## 2020-03-21 NOTE — Telephone Encounter (Signed)
Xolair Prefilled Syringe Received:  150mg  Prefilled Syringe >> quantity #1, lot # [id number], exp date 01/03/2021 75mg  Prefilled Syringe >> quantity #n/a Medication arrival date: 03/21/20 Received by: Dustin Bumbaugh,LPN

## 2020-03-27 ENCOUNTER — Other Ambulatory Visit: Payer: Self-pay

## 2020-03-27 ENCOUNTER — Ambulatory Visit (INDEPENDENT_AMBULATORY_CARE_PROVIDER_SITE_OTHER): Payer: Medicare Other

## 2020-03-27 DIAGNOSIS — J4540 Moderate persistent asthma, uncomplicated: Secondary | ICD-10-CM | POA: Diagnosis not present

## 2020-03-27 MED ORDER — OMALIZUMAB 150 MG/ML ~~LOC~~ SOSY
150.0000 mg | PREFILLED_SYRINGE | Freq: Once | SUBCUTANEOUS | Status: AC
  Administered 2020-03-27: 150 mg via SUBCUTANEOUS

## 2020-03-27 NOTE — Progress Notes (Signed)
Have you been hospitalized within the last 10 days?  No Do you have a fever?  No Do you have a cough?  No Do you have a headache or sore throat? No Do you have your Epi Pen visible and is it within date?  Yes 

## 2020-04-02 ENCOUNTER — Other Ambulatory Visit (HOSPITAL_COMMUNITY): Payer: Self-pay | Admitting: Pharmacy Technician

## 2020-04-02 DIAGNOSIS — J4540 Moderate persistent asthma, uncomplicated: Secondary | ICD-10-CM

## 2020-04-14 DIAGNOSIS — J4540 Moderate persistent asthma, uncomplicated: Secondary | ICD-10-CM | POA: Insufficient documentation

## 2020-04-17 ENCOUNTER — Other Ambulatory Visit: Payer: Self-pay | Admitting: Internal Medicine

## 2020-04-17 DIAGNOSIS — F3341 Major depressive disorder, recurrent, in partial remission: Secondary | ICD-10-CM

## 2020-04-17 MED ORDER — SERTRALINE HCL 50 MG PO TABS: ORAL_TABLET | ORAL | 1 refills | Status: DC

## 2020-04-19 DIAGNOSIS — Z6824 Body mass index (BMI) 24.0-24.9, adult: Secondary | ICD-10-CM | POA: Diagnosis not present

## 2020-04-19 DIAGNOSIS — Z1231 Encounter for screening mammogram for malignant neoplasm of breast: Secondary | ICD-10-CM | POA: Diagnosis not present

## 2020-04-19 DIAGNOSIS — Z01419 Encounter for gynecological examination (general) (routine) without abnormal findings: Secondary | ICD-10-CM | POA: Diagnosis not present

## 2020-04-24 ENCOUNTER — Other Ambulatory Visit: Payer: Self-pay

## 2020-04-24 ENCOUNTER — Ambulatory Visit (INDEPENDENT_AMBULATORY_CARE_PROVIDER_SITE_OTHER): Payer: Medicare Other

## 2020-04-24 ENCOUNTER — Ambulatory Visit: Payer: Medicare Other

## 2020-04-24 VITALS — BP 128/67 | HR 62 | Temp 97.9°F | Resp 18

## 2020-04-24 DIAGNOSIS — J4540 Moderate persistent asthma, uncomplicated: Secondary | ICD-10-CM | POA: Diagnosis not present

## 2020-04-24 MED ORDER — EPINEPHRINE 0.3 MG/0.3ML IJ SOAJ: 0.3000 mg | Freq: Once | INTRAMUSCULAR | Status: DC | PRN

## 2020-04-24 MED ORDER — ALBUTEROL SULFATE HFA 108 (90 BASE) MCG/ACT IN AERS: 2.0000 | INHALATION_SPRAY | Freq: Once | RESPIRATORY_TRACT | Status: DC | PRN

## 2020-04-24 MED ORDER — METHYLPREDNISOLONE SODIUM SUCC 125 MG IJ SOLR: 125.0000 mg | Freq: Once | INTRAMUSCULAR | Status: DC | PRN

## 2020-04-24 MED ORDER — OMALIZUMAB 150 MG/ML ~~LOC~~ SOSY
150.0000 mg | PREFILLED_SYRINGE | Freq: Once | SUBCUTANEOUS | Status: AC
  Administered 2020-04-24: 150 mg via SUBCUTANEOUS
  Filled 2020-04-24: qty 1

## 2020-04-24 MED ORDER — SODIUM CHLORIDE 0.9 % IV SOLN: Freq: Once | INTRAVENOUS | Status: DC | PRN

## 2020-04-24 MED ORDER — DIPHENHYDRAMINE HCL 50 MG/ML IJ SOLN: 50.0000 mg | Freq: Once | INTRAMUSCULAR | Status: DC | PRN

## 2020-04-24 MED ORDER — FAMOTIDINE IN NACL 20-0.9 MG/50ML-% IV SOLN: 20.0000 mg | Freq: Once | INTRAVENOUS | Status: DC | PRN

## 2020-04-24 NOTE — Progress Notes (Addendum)
Diagnosis: Asthma  Provider:  Praveen Mannam, MD  Procedure: Injection  Xolair (Omalizumab), Dose: 150 mg, Site: subcutaneous  Discharge: Condition: Good, Destination: Home . AVS provided to patient.   Performed by:  Macyn Remmert, RN       

## 2020-05-22 ENCOUNTER — Ambulatory Visit: Payer: Medicare Other

## 2020-05-26 ENCOUNTER — Other Ambulatory Visit: Payer: Self-pay

## 2020-05-26 ENCOUNTER — Ambulatory Visit (INDEPENDENT_AMBULATORY_CARE_PROVIDER_SITE_OTHER): Payer: Medicare Other

## 2020-05-26 VITALS — BP 137/78 | HR 58 | Temp 98.0°F | Resp 18

## 2020-05-26 DIAGNOSIS — J4540 Moderate persistent asthma, uncomplicated: Secondary | ICD-10-CM | POA: Diagnosis not present

## 2020-05-26 MED ORDER — FAMOTIDINE IN NACL 20-0.9 MG/50ML-% IV SOLN: 20.0000 mg | Freq: Once | INTRAVENOUS | Status: DC | PRN

## 2020-05-26 MED ORDER — ALBUTEROL SULFATE HFA 108 (90 BASE) MCG/ACT IN AERS: 2.0000 | INHALATION_SPRAY | Freq: Once | RESPIRATORY_TRACT | Status: DC | PRN

## 2020-05-26 MED ORDER — OMALIZUMAB 150 MG/ML ~~LOC~~ SOSY
150.0000 mg | PREFILLED_SYRINGE | Freq: Once | SUBCUTANEOUS | Status: AC
  Administered 2020-05-26: 150 mg via SUBCUTANEOUS
  Filled 2020-05-26: qty 1

## 2020-05-26 MED ORDER — DIPHENHYDRAMINE HCL 50 MG/ML IJ SOLN: 50.0000 mg | Freq: Once | INTRAMUSCULAR | Status: DC | PRN

## 2020-05-26 MED ORDER — SODIUM CHLORIDE 0.9 % IV SOLN: Freq: Once | INTRAVENOUS | Status: DC | PRN

## 2020-05-26 MED ORDER — EPINEPHRINE 0.3 MG/0.3ML IJ SOAJ: 0.3000 mg | Freq: Once | INTRAMUSCULAR | Status: DC | PRN

## 2020-05-26 MED ORDER — METHYLPREDNISOLONE SODIUM SUCC 125 MG IJ SOLR: 125.0000 mg | Freq: Once | INTRAMUSCULAR | Status: DC | PRN

## 2020-05-26 NOTE — Progress Notes (Signed)
Diagnosis: Asthma  Provider:  Marshell Garfinkel, MD  Procedure: Injection  Xolair (Omalizumab), Dose: 150 mg, Site: subcutaneous  Discharge: Condition: Good, Destination: Home . AVS provided to patient.   Performed by:  Paul Dykes, RN

## 2020-06-11 ENCOUNTER — Encounter: Payer: Self-pay | Admitting: Internal Medicine

## 2020-06-11 DIAGNOSIS — R0989 Other specified symptoms and signs involving the circulatory and respiratory systems: Secondary | ICD-10-CM | POA: Insufficient documentation

## 2020-06-11 NOTE — Progress Notes (Signed)
Annual Screening/Preventative Visit & Comprehensive Evaluation &  Examination  Future Appointments  Date Time Provider [place]  06/12/2020  3:00 PM [name], MD [facility] None  09/14/2020 - Wellness   2:30 PM Liane Comber, NP [facility] None  06/13/2021  3:00 PM [name], MD [facility] None        This very nice 87 y.o. MWF presents for a Screening /Preventative Visit & comprehensive evaluation and management of multiple medical co-morbidities.  Patient has been followed for HTN, HLD, Prediabetes  and Vitamin D Deficiency. Patient has Allergic Asthma and is followed by [name] on Xolair inj.                                                                                                                                                                Labile HTN predates  circa 2004 followed expectantly and in 2013, she had a negative Stress Myoview.  Patient's BP has been controlled at home and patient denies any cardiac symptoms as chest pain, palpitations, shortness of breath, dizziness or ankle swelling. Today's BP is at goal - 138/84.        Patient's hyperlipidemia is controlled with diet & Simvastatin. Patient denies myalgias or other medication SE's. Last lipids were at goal:  Lab Results  Component Value Date   CHOL 175 09/08/2019   HDL 74 09/08/2019   LDLCALC 82 09/08/2019   TRIG 101 09/08/2019   CHOLHDL 2.4 09/08/2019         Patient has hx/o prediabetes (A1c 6.0% /2011 and 5.7% /2012) and patient denies reactive hypoglycemic symptoms, visual blurring, diabetic polys or paresthesias. Last A1c was normal & at goal:  Lab Results  Component Value Date   HGBA1C 5.2 06/08/2019                                             Patient has been on Thyroid Replacement since the 1980's when discovered Hypothyroid.        Finally, patient has history of Vitamin D Deficiency and last Vitamin D was near [place]; (60-100).   Lab Results  Component Value Date    VD25OH 57 06/08/2019     Current Outpatient Medications on File Prior to Visit  Medication Sig  . VITAMIN C 500 mg  Take  daily.  [name] aspirin EC 81 MG tablet Take daily.  . budesonide  0.25 MG/2ML neb  soln USE 1 VIAL   TWICE  DAILY -  . VITAMIN D 5,000 Units Take   daily.  [name] dicyclomine  20 MG tablet Take 1/2 to 1 tablet 4 x /day    . EPIPEN 0.15 MG/0.3ML  injec Inject 0.15 mg into the muscle as needed.  . COMBIVENT RESPIMAT  1 INHALATION EVERY 6 HRS AS NEEDED   . levothyroxine75 MCG tablet Take 1 tablet daily   . loratadine  10 MG tablet Take 1 tablet  daily as needed  . meclizine 25 MG tablet Take 1/2 to 1 tablet 3 x /day as needed   . PERFOROMIST 20 MCG/2ML neb soln USE 1 VIAL  IN  NEB TWICE  DAILY   . sertraline \ 50 MG tablet Take  1 tablet  Daily  for mood.  . triamcinolone oint 0.1 % Apply  2  times daily.  [name] zinc  50 MG tablet Take daily.   [name] omalizumab Arvid Right) injection 150 mg     Allergies  Allergen Reactions  . Codeine Nausea Only  . Propoxyphene N-Acetaminophen Nausea Only  . Shellfish-Derived Products Rash     Past Medical History:  Diagnosis Date  . Asthma   . COPD (chronic obstructive pulmonary disease) ([place])   . Depression   . Dizziness   . Hyperlipemia   . Hypertension   . Hypothyroidism   . Prediabetes   . SVD (spontaneous vaginal delivery)    x 2  . Vitamin D deficiency      Health Maintenance  Topic Date Due  . COVID-19 Vaccine (3 - Booster for Pfizer series) 10/29/2019  . INFLUENZA VACCINE  09/04/2020  . TETANUS/TDAP  03/13/2027  . DEXA SCAN  Completed  . PNA vac Low Risk Adult  Completed  . HPV VACCINES  Aged Out     Immunization History  Administered Date(s) Administered  . Influenza Whole 10/05/2009, 11/26/2010, 11/05/2011  . Influenza, High Dose S  11/20/2017, 11/10/2018, 11/17/2019  . PFIZER SARS-COV-2 Vacci 04/04/2019, 04/28/2019  . Pneumococcal Conjugate-13 11/22/2013  . Pneumococcal Polysaccharide-23 09/14/2008  . Td  10/09/2004, 03/12/2017  . Zoster 02/15/2013    Last Colon - 09/2005 - Dr Deborha Payment - No f/u due to age  Last MGM - 01/11/2019  Past Surgical History:  Procedure Laterality Date  . CATARACT EXTRACTION Right 09/03/2019   Dr. Katy Fitch  . DILATATION & CURRETTAGE/HYSTEROSCOPY WITH RESECTOCOPE N/A 09/10/2013   Procedure: DILATATION & CURETTAGE/HYSTEROSCOPY WITH RESECTOCOPE;  Surgeon: Darlyn Chamber, MD;  Location: [facility];  Service: Gynecology;  Laterality: N/A;  . TONSILLECTOMY    . TUBAL LIGATION Bilateral 1970  . WISDOM TOOTH EXTRACTION       Family History  Problem Relation Age of Onset  . Thyroid disease Mother   . Cancer Brother        Thyroid  . Gout Brother   . Hodgkin's lymphoma Brother   . Other Father        unsure of medical history    Social History   Tobacco Use  . Smoking status: Never Smoker  . Smokeless tobacco: Never Used  Vaping Use  . Vaping Use: Never used  Substance Use Topics  . Alcohol use: Yes    Comment: Wine daily  . Drug use: No     ROS Constitutional: Denies fever, chills, weight loss/gain, headaches, insomnia,  night sweats, and change in appetite. Does c/o fatigue. Eyes: Denies redness, blurred vision, diplopia, discharge, itchy, watery eyes.  ENT: Denies discharge, congestion, post nasal drip, epistaxis, sore throat, earache, hearing loss, dental pain, Tinnitus, Vertigo, Sinus pain, snoring.  Cardio: Denies chest pain, palpitations, irregular heartbeat, syncope, dyspnea, diaphoresis, orthopnea, PND, claudication, edema Respiratory: denies cough, dyspnea, DOE, pleurisy, hoarseness, laryngitis, wheezing.  Gastrointestinal: Denies  dysphagia, heartburn, reflux, water brash, pain, cramps, nausea, vomiting, bloating, diarrhea, constipation, hematemesis, melena, hematochezia, jaundice, hemorrhoids Genitourinary: Denies dysuria, frequency, urgency, nocturia, hesitancy, discharge, hematuria, flank pain Breast: Breast lumps, nipple discharge, bleeding.   Musculoskeletal: Denies arthralgia, myalgia, stiffness, Jt. Swelling, pain, limp, and strain/sprain. Denies falls. Skin: Denies puritis, rash, hives, warts, acne, eczema, changing in skin lesion Neuro: No weakness, tremor, incoordination, spasms, paresthesia, pain Psychiatric: Denies confusion, memory loss, sensory loss. Denies Depression. Endocrine: Denies change in weight, skin, hair change, nocturia, and paresthesia, diabetic polys, visual blurring, hyper / hypo glycemic episodes.  Heme/Lymph: No excessive bleeding, bruising, enlarged lymph nodes.  Physical Exam  BP 138/84   Pulse 69   Temp 97.9 F (36.6 C)   Resp 16   Ht 4\' 11"  (1.499 m)   Wt 122 lb 12.8 oz (55.7 kg)   SpO2 93%   BMI 24.80 kg/m   General Appearance: Well nourished, well groomed and in no apparent distress.  Eyes: PERRLA, EOMs, conjunctiva no swelling or erythema, normal fundi and vessels. Sinuses: No frontal/maxillary tenderness ENT/Mouth: EACs patent / TMs  nl. Nares clear without erythema, swelling, mucoid exudates. Oral hygiene is good. No erythema, swelling, or exudate. Tongue normal, non-obstructing. Tonsils not swollen or erythematous. Hearing normal.  Neck: Supple, thyroid not palpable. No bruits, nodes or JVD. Respiratory: Respiratory effort normal.  BS equal and clear bilateral without rales, rhonci, wheezing or stridor. Cardio: Heart sounds are normal with regular rate and rhythm and no murmurs, rubs or gallops. Peripheral pulses are normal and equal bilaterally without edema. No aortic or femoral bruits. Chest: symmetric with normal excursions and percussion. Breasts: Symmetric, without lumps, nipple discharge, retractions, or fibrocystic changes.  Abdomen: Flat, soft with bowel sounds active. Nontender, no guarding, rebound, hernias, masses, or organomegaly.  Lymphatics: Non tender without lymphadenopathy.  Genitourinary:  Musculoskeletal: Full ROM all peripheral extremities, joint stability, 5/5  strength, and normal gait. Skin: Warm and dry without rashes, lesions, cyanosis, clubbing or  ecchymosis.  Neuro: Cranial nerves intact, reflexes equal bilaterally. Normal muscle tone, no cerebellar symptoms. Sensation intact.  Pysch: Alert and oriented X 3, normal affect, Insight and Judgment appropriate.    Assessment and Plan   1. Labile hypertension  - EKG 12-Lead - Microalbumin / creatinine urine ratio - CBC with Differential/Platelet - COMPLETE METABOLIC PANEL WITH GFR - Magnesium - TSH - Urinalysis, Routine w reflex microscopic  - Add bisoprolol-hctz 5-6.25; Take  1 tablet  Daily   2. Hyperlipidemia, mixed  - EKG 12-Lead - Lipid panel - TSH  3. Abnormal glucose  - EKG 12-Lead - Insulin, random  4. Vitamin D deficiency  - VITAMIN D 25 Hydroxy   5. Hypothyroidism  - TSH  6. Gastroesophageal reflux disease  - CBC with Differential/Platelet  7. Extrinsic asthma   8. Screening for ischemic heart disease  - EKG 12-Lead  9. Screening for colorectal cancer  - POC Hemoccult Bld/Stl  10. Medication management  - CBC with Differential/Platelet - COMPLETE METABOLIC PANEL WITH GFR - Magnesium - Lipid panel - TSH - Hemoglobin A1c - Insulin, random - VITAMIN D 25 Hydroxy - Urinalysis, Routine           Patient was counseled in prudent diet to achieve/maintain BMI less than 25 for weight control, BP monitoring, regular exercise and medications. Discussed med's effects and SE's. Screening labs and tests as requested with regular follow-up as recommended. Over 40 minutes of exam, counseling, chart review and high complex critical decision making was  performed.   Kirtland Bouchard, MD

## 2020-06-11 NOTE — Patient Instructions (Signed)

## 2020-06-12 ENCOUNTER — Encounter: Payer: Self-pay | Admitting: Internal Medicine

## 2020-06-12 ENCOUNTER — Other Ambulatory Visit: Payer: Self-pay

## 2020-06-12 ENCOUNTER — Ambulatory Visit (INDEPENDENT_AMBULATORY_CARE_PROVIDER_SITE_OTHER): Payer: Medicare Other | Admitting: Internal Medicine

## 2020-06-12 VITALS — BP 138/84 | HR 69 | Temp 97.9°F | Resp 16 | Ht 59.0 in | Wt 122.8 lb

## 2020-06-12 DIAGNOSIS — I10 Essential (primary) hypertension: Secondary | ICD-10-CM

## 2020-06-12 DIAGNOSIS — R7309 Other abnormal glucose: Secondary | ICD-10-CM

## 2020-06-12 DIAGNOSIS — J4540 Moderate persistent asthma, uncomplicated: Secondary | ICD-10-CM | POA: Diagnosis not present

## 2020-06-12 DIAGNOSIS — E559 Vitamin D deficiency, unspecified: Secondary | ICD-10-CM

## 2020-06-12 DIAGNOSIS — E039 Hypothyroidism, unspecified: Secondary | ICD-10-CM | POA: Diagnosis not present

## 2020-06-12 DIAGNOSIS — K219 Gastro-esophageal reflux disease without esophagitis: Secondary | ICD-10-CM | POA: Diagnosis not present

## 2020-06-12 DIAGNOSIS — E782 Mixed hyperlipidemia: Secondary | ICD-10-CM | POA: Diagnosis not present

## 2020-06-12 DIAGNOSIS — Z1212 Encounter for screening for malignant neoplasm of rectum: Secondary | ICD-10-CM

## 2020-06-12 DIAGNOSIS — Z79899 Other long term (current) drug therapy: Secondary | ICD-10-CM

## 2020-06-12 DIAGNOSIS — Z136 Encounter for screening for cardiovascular disorders: Secondary | ICD-10-CM

## 2020-06-12 DIAGNOSIS — R0989 Other specified symptoms and signs involving the circulatory and respiratory systems: Secondary | ICD-10-CM | POA: Diagnosis not present

## 2020-06-12 DIAGNOSIS — Z1211 Encounter for screening for malignant neoplasm of colon: Secondary | ICD-10-CM

## 2020-06-12 DIAGNOSIS — J4530 Mild persistent asthma, uncomplicated: Secondary | ICD-10-CM

## 2020-06-12 MED ORDER — BISOPROLOL-HYDROCHLOROTHIAZIDE 5-6.25 MG PO TABS: ORAL_TABLET | ORAL | 3 refills | Status: DC

## 2020-06-12 NOTE — Progress Notes (Unsigned)
Erroneous encounter

## 2020-06-13 ENCOUNTER — Telehealth: Payer: Self-pay | Admitting: Internal Medicine

## 2020-06-13 DIAGNOSIS — J4530 Mild persistent asthma, uncomplicated: Secondary | ICD-10-CM

## 2020-06-13 LAB — URINALYSIS, ROUTINE W REFLEX MICROSCOPIC
Bilirubin Urine: NEGATIVE
Glucose, UA: NEGATIVE
Hgb urine dipstick: NEGATIVE
Ketones, ur: NEGATIVE
Leukocytes,Ua: NEGATIVE
Nitrite: NEGATIVE
Protein, ur: NEGATIVE
Specific Gravity, Urine: 1.009 (ref 1.001–1.035)
pH: 6 (ref 5.0–8.0)

## 2020-06-13 LAB — COMPLETE METABOLIC PANEL WITH GFR
AG Ratio: 1.7 (calc) (ref 1.0–2.5)
ALT: 11 U/L (ref 6–29)
AST: 17 U/L (ref 10–35)
Albumin: 4.1 g/dL (ref 3.6–5.1)
Alkaline phosphatase (APISO): 71 U/L (ref 37–153)
BUN: 21 mg/dL (ref 7–25)
CO2: 29 mmol/L (ref 20–32)
Calcium: 9.7 mg/dL (ref 8.6–10.4)
Chloride: 100 mmol/L (ref 98–110)
Creat: 0.77 mg/dL (ref 0.60–0.88)
GFR, Est African American: 82 mL/min/{1.73_m2} (ref >=60)
GFR, Est Non African American: 71 mL/min/{1.73_m2} (ref >=60)
Globulin: 2.4 g/dL (calc) (ref 1.9–3.7)
Glucose, Bld: 92 mg/dL (ref 65–99)
Potassium: 4.5 mmol/L (ref 3.5–5.3)
Sodium: 136 mmol/L (ref 135–146)
Total Bilirubin: 0.5 mg/dL (ref 0.2–1.2)
Total Protein: 6.5 g/dL (ref 6.1–8.1)

## 2020-06-13 LAB — LIPID PANEL
Cholesterol: 236 mg/dL — ABNORMAL HIGH (ref <200)
HDL: 67 mg/dL (ref >=50)
LDL Cholesterol (Calc): 145 mg/dL (calc) — ABNORMAL HIGH
Non-HDL Cholesterol (Calc): 169 mg/dL (calc) — ABNORMAL HIGH (ref <130)
Total CHOL/HDL Ratio: 3.5 (calc) (ref <5.0)
Triglycerides: 118 mg/dL (ref <150)

## 2020-06-13 LAB — CBC WITH DIFFERENTIAL/PLATELET
Absolute Monocytes: 606 cells/uL (ref 200–950)
Basophils Absolute: 48 cells/uL (ref 0–200)
Basophils Relative: 0.8 %
Eosinophils Absolute: 102 cells/uL (ref 15–500)
Eosinophils Relative: 1.7 %
HCT: 40.4 % (ref 35.0–45.0)
Hemoglobin: 13.2 g/dL (ref 11.7–15.5)
Lymphs Abs: 1314 cells/uL (ref 850–3900)
MCH: 29.6 pg (ref 27.0–33.0)
MCHC: 32.7 g/dL (ref 32.0–36.0)
MCV: 90.6 fL (ref 80.0–100.0)
MPV: 11.2 fL (ref 7.5–12.5)
Monocytes Relative: 10.1 %
Neutro Abs: 3930 cells/uL (ref 1500–7800)
Neutrophils Relative %: 65.5 %
Platelets: 264 10*3/uL (ref 140–400)
RBC: 4.46 10*6/uL (ref 3.80–5.10)
RDW: 13 % (ref 11.0–15.0)
Total Lymphocyte: 21.9 %
WBC: 6 10*3/uL (ref 3.8–10.8)

## 2020-06-13 LAB — HEMOGLOBIN A1C
Hgb A1c MFr Bld: 5.4 % of total Hgb (ref <5.7)
Mean Plasma Glucose: 108 mg/dL
eAG (mmol/L): 6 mmol/L

## 2020-06-13 LAB — TSH: TSH: 1.81 mIU/L (ref 0.40–4.50)

## 2020-06-13 LAB — INSULIN, RANDOM: Insulin: 10 u[IU]/mL

## 2020-06-13 LAB — MICROALBUMIN / CREATININE URINE RATIO
Creatinine, Urine: 34 mg/dL (ref 20–275)
Microalb Creat Ratio: 12 mcg/mg creat (ref <30)
Microalb, Ur: 0.4 mg/dL

## 2020-06-13 LAB — VITAMIN D 25 HYDROXY (VIT D DEFICIENCY, FRACTURES): Vit D, 25-Hydroxy: 68 ng/mL (ref 30–100)

## 2020-06-13 LAB — MAGNESIUM: Magnesium: 1.9 mg/dL (ref 1.5–2.5)

## 2020-06-13 MED ORDER — FORMOTEROL FUMARATE 20 MCG/2ML IN NEBU: INHALATION_SOLUTION | RESPIRATORY_TRACT | 3 refills | Status: DC

## 2020-06-13 MED ORDER — BUDESONIDE 0.25 MG/2ML IN SUSP
RESPIRATORY_TRACT | 3 refills | Status: DC
Start: 2020-06-13 — End: 2020-06-14

## 2020-06-13 NOTE — Progress Notes (Signed)
============================================================ -   Test results slightly outside the reference range are not unusual. If there is anything important, I will review this with you,  otherwise it is considered normal test values.  If you have further questions,  please do not hesitate to contact me at the office or via My Chart.  ============================================================ ============================================================  -  Total Chol - much Worse - has gone up from 175 (great) to now 236 (Bad)   - and Bad LDL Cholesterol has gone up from 82 (great) to now 145 (bad ! )  - Recommend low cholesterol diet   - Cholesterol only comes from animal sources  - ie. meat, dairy, egg yolks  - Eat all the vegetables you want.  - Avoid meat, especially red meat - Beef AND Pork .  - Avoid cheese & dairy - milk & ice cream.     - Cheese is the most concentrated form of trans-fats which  is the worst thing to clog up our arteries.   - Veggie cheese is OK which can be found in the fresh  produce section at [employer] or [employer] or [employer]  ============================================================ ============================================================  -  TSH is Normal & OK now ============================================================ ============================================================  -  A1c - Normal - Great - No Diabetes ! ============================================================ ============================================================  -  Vitamin D = 68 - Great  ! ============================================================ ============================================================  -  All Else - CBC - Kidneys - U/A - Electrolytes - Liver - Magnesium & Thyroid    - all  Normal / OK ===========================================================

## 2020-06-13 NOTE — Telephone Encounter (Signed)
Called and spoke with Patient.  Patient requested pulmicort and perforomist refills to be placed to [facility].  Patient scheduled 1 year follow up 06/30/20 at 1045, with Dr. [name].  Requested prescriptions placed to [facility].  Nothing further at this time.

## 2020-06-14 ENCOUNTER — Telehealth: Payer: Self-pay | Admitting: Internal Medicine

## 2020-06-14 DIAGNOSIS — J4530 Mild persistent asthma, uncomplicated: Secondary | ICD-10-CM

## 2020-06-14 MED ORDER — BUDESONIDE 0.25 MG/2ML IN SUSP: RESPIRATORY_TRACT | 3 refills | Status: DC

## 2020-06-14 MED ORDER — FORMOTEROL FUMARATE 20 MCG/2ML IN NEBU: INHALATION_SOLUTION | RESPIRATORY_TRACT | 3 refills | Status: DC

## 2020-06-14 NOTE — Telephone Encounter (Signed)
New rxs for pulmicort and perforomist nebs sent with correct quantity.

## 2020-06-16 ENCOUNTER — Telehealth: Payer: Self-pay | Admitting: Internal Medicine

## 2020-06-16 DIAGNOSIS — J4530 Mild persistent asthma, uncomplicated: Secondary | ICD-10-CM

## 2020-06-16 MED ORDER — FORMOTEROL FUMARATE 20 MCG/2ML IN NEBU: INHALATION_SOLUTION | RESPIRATORY_TRACT | 3 refills | Status: DC

## 2020-06-16 MED ORDER — BUDESONIDE 0.25 MG/2ML IN SUSP
RESPIRATORY_TRACT | 3 refills | Status: DC
Start: 2020-06-16 — End: 2020-08-18

## 2020-06-16 NOTE — Telephone Encounter (Signed)
Pharmacist states when we send them electronically [facility] has to push them through to the pharmacy. She suggested I send them again and also gave me an alternate fax number: [phone].  LMTCB to inform patient.

## 2020-06-19 ENCOUNTER — Ambulatory Visit: Payer: Medicare Other

## 2020-06-19 NOTE — Telephone Encounter (Signed)
Called and spoke with pt letting her know that we did resend neb sol to [facility] for her. Pt said that meds were overnighted and she has received them. Nothing further needed.

## 2020-06-27 ENCOUNTER — Ambulatory Visit (INDEPENDENT_AMBULATORY_CARE_PROVIDER_SITE_OTHER): Payer: Medicare Other | Admitting: *Deleted

## 2020-06-27 ENCOUNTER — Other Ambulatory Visit: Payer: Self-pay

## 2020-06-27 VITALS — BP 142/86 | HR 58 | Temp 97.5°F | Resp 18

## 2020-06-27 DIAGNOSIS — J4540 Moderate persistent asthma, uncomplicated: Secondary | ICD-10-CM | POA: Diagnosis not present

## 2020-06-27 MED ORDER — ALBUTEROL SULFATE HFA 108 (90 BASE) MCG/ACT IN AERS: 2.0000 | INHALATION_SPRAY | Freq: Once | RESPIRATORY_TRACT | Status: DC | PRN

## 2020-06-27 MED ORDER — METHYLPREDNISOLONE SODIUM SUCC 125 MG IJ SOLR: 125.0000 mg | Freq: Once | INTRAMUSCULAR | Status: DC | PRN

## 2020-06-27 MED ORDER — SODIUM CHLORIDE 0.9 % IV SOLN: Freq: Once | INTRAVENOUS | Status: DC | PRN

## 2020-06-27 MED ORDER — OMALIZUMAB 150 MG/ML ~~LOC~~ SOSY
150.0000 mg | PREFILLED_SYRINGE | Freq: Once | SUBCUTANEOUS | Status: AC
  Administered 2020-06-27: 150 mg via SUBCUTANEOUS

## 2020-06-27 MED ORDER — EPINEPHRINE 0.3 MG/0.3ML IJ SOAJ: 0.3000 mg | Freq: Once | INTRAMUSCULAR | Status: DC | PRN

## 2020-06-27 MED ORDER — FAMOTIDINE IN NACL 20-0.9 MG/50ML-% IV SOLN: 20.0000 mg | Freq: Once | INTRAVENOUS | Status: DC | PRN

## 2020-06-27 MED ORDER — DIPHENHYDRAMINE HCL 50 MG/ML IJ SOLN: 50.0000 mg | Freq: Once | INTRAMUSCULAR | Status: DC | PRN

## 2020-06-27 NOTE — Progress Notes (Signed)
Diagnosis: Asthma  Provider:  Marshell Garfinkel, MD  Procedure: Injection  Xolair (Omalizumab), Dose: 150 mg, Site: subcutaneous  Discharge: Condition: Good, Destination: Home . AVS provided to patient.   Performed by:  Oren Beckmann, RN

## 2020-06-28 ENCOUNTER — Encounter: Payer: Self-pay | Admitting: Internal Medicine

## 2020-06-28 DIAGNOSIS — H538 Other visual disturbances: Secondary | ICD-10-CM | POA: Diagnosis not present

## 2020-06-28 DIAGNOSIS — Z961 Presence of intraocular lens: Secondary | ICD-10-CM | POA: Diagnosis not present

## 2020-06-30 ENCOUNTER — Ambulatory Visit: Payer: Medicare Other | Admitting: Internal Medicine

## 2020-06-30 NOTE — Progress Notes (Deleted)
Subjective:     Patient ID: Kathy Whitaker, female   DOB: 1933/11/19   MRN: [id number]    Brief patient profile:  37  yowf never smoker with h/o allergic rhinitis/ moderate chronic asthma dating back to  Her late 85s and much better on xolair/performist/budesonide previously under Dr Bettina Gavia care  - on [place] since around 2009 / reduced to monthly rx 04/2015 by Dr Annamaria Boots      History of Present Illness 05/02/2015 1st   office visit/ [name] / transition of care from Dr Joya Gaskins  Chief Complaint  Patient presents with  . Follow-up    Former PW pt. Pt c/o occasional SOB with exertion. Pt states that her breathing is doing well. She recently had a cold but is improving. Pt denies cough/wheeze/CP/tightness. Pt is on Xolair and reports no complications/reactions.   maint rx with performist/budesonide rarely need combivent (very poor hfa noted - see a/p) Says other forms of ICS don't work. Concerned about longterm effects of meds  rec Combine your nebulizer solutions and take each am plus second treatment in pm if needed. Work on Doctor, hospital.     11/18/2017  f/u ov/Anitra Doxtater re:  Asthma/ rhinitis on xolair monthly and performist/bud daily  Chief Complaint  Patient presents with  . Follow-up    Last seen in 2017.  She is here just for f/u so she can continue on xolair. She denies any co's. She rarely uses her combivent for rescue.   Dyspnea:  Doe x steps on or off xolair (says worse when late for a single dose)  Cough: not a problem Sleeping: fine flat  SABA use:  None / poor understanding of smi technique for combivent  02: none   rec Combine your nebulizer solutions and take each am plus second treatment in pm if needed. Work on inhaler technique:    Only use your a combivent as a rescue medication    06/04/2019  f/u ov/[place] re: asthma/rhinitis on brovana/bud q am with optional second dose Chief Complaint  Patient presents with  . Follow-up  Dyspnea:  Not limited by breathing from desired  activities unless does lot of flights    Cough: minimal  Sleeping: bed is flat/one pillow  SABA use: hardly ever uses combivent/ very poor hfa  02: none rec No change in your medication or xolair injections  Please schedule a follow up visit in 12 months but call sooner if needed  - bring your inhaler with you   06/30/2020  f/u ov/Mubashir Mallek re:  No chief complaint on file.   Dyspnea:  *** Cough: *** Sleeping: *** SABA use: *** 02: *** Covid status:   ***   No obvious day to day or daytime variability or assoc excess/ purulent sputum or mucus plugs or hemoptysis or cp or chest tightness, subjective wheeze or overt sinus or hb symptoms.   *** without nocturnal  or early am exacerbation  of respiratory  c/o's or need for noct saba. Also denies any obvious fluctuation of symptoms with weather or environmental changes or other aggravating or alleviating factors except as outlined above   No unusual exposure hx or h/o childhood pna/ asthma or knowledge of premature birth.  Current Allergies, Complete Past Medical History, Past Surgical History, Family History, and Social History were reviewed in [employer] record.  ROS  The following are not active complaints unless bolded Hoarseness, sore throat, dysphagia, dental problems, itching, sneezing,  nasal congestion or discharge of excess mucus or purulent  secretions, ear ache,   fever, chills, sweats, unintended wt loss or wt gain, classically pleuritic or exertional cp,  orthopnea pnd or arm/hand swelling  or leg swelling, presyncope, palpitations, abdominal pain, anorexia, nausea, vomiting, diarrhea  or change in bowel habits or change in bladder habits, change in stools or change in urine, dysuria, hematuria,  rash, arthralgias, visual complaints, headache, numbness, weakness or ataxia or problems with walking or coordination,  change in mood or  memory.        No outpatient medications have been marked as taking for the  06/30/20 encounter (Appointment) with [name], MD.   Current Facility-Administered Medications for the 06/30/20 encounter (Appointment) with [name], MD  Medication  . omalizumab Arvid Right) injection 150 mg  . omalizumab Arvid Right) injection 150 mg                Objective:   Physical Exam   amb wf nad  06/04/2019        125   11/18/2017     127   05/02/15 136 lb (61.689 kg)  11/29/14 136 lb (61.689 kg)  03/23/14 138 lb (62.596 kg)     Vital signs reviewed  06/04/2019  - Note at rest 02 sats  94% on RA     HEENT : pt wearing mask not removed for exam due to covid -19 concerns.    NECK :  without JVD/Nodes/TM/ nl carotid upstrokes bilaterally   LUNGS: no acc muscle use,  Nl contour chest which is clear to A and P bilaterally without cough on insp or exp maneuvers   CV:  RRR  no s3 or murmur or increase in P2, and no edema   ABD:  soft and nontender with nl inspiratory excursion in the supine position. No bruits or organomegaly appreciated, bowel sounds nl  MS:  Nl gait/ ext warm without deformities, calf tenderness, cyanosis or clubbing No obvious joint restrictions   SKIN: warm and dry without lesions    NEURO:  alert, approp, nl sensorium with  no motor or cerebellar deficits apparent.            Assessment:

## 2020-07-07 DIAGNOSIS — R059 Cough, unspecified: Secondary | ICD-10-CM | POA: Diagnosis not present

## 2020-07-07 DIAGNOSIS — Z20822 Contact with and (suspected) exposure to covid-19: Secondary | ICD-10-CM | POA: Diagnosis not present

## 2020-07-07 DIAGNOSIS — R0981 Nasal congestion: Secondary | ICD-10-CM | POA: Diagnosis not present

## 2020-07-07 DIAGNOSIS — J029 Acute pharyngitis, unspecified: Secondary | ICD-10-CM | POA: Diagnosis not present

## 2020-07-11 ENCOUNTER — Other Ambulatory Visit: Payer: Self-pay

## 2020-07-11 DIAGNOSIS — Z1211 Encounter for screening for malignant neoplasm of colon: Secondary | ICD-10-CM

## 2020-07-11 DIAGNOSIS — Z1212 Encounter for screening for malignant neoplasm of rectum: Secondary | ICD-10-CM

## 2020-07-11 LAB — POC HEMOCCULT BLD/STL (HOME/3-CARD/SCREEN)
Card #2 Fecal Occult Blod, POC: NEGATIVE
Card #3 Fecal Occult Blood, POC: NEGATIVE
Fecal Occult Blood, POC: NEGATIVE

## 2020-07-17 DIAGNOSIS — Z1211 Encounter for screening for malignant neoplasm of colon: Secondary | ICD-10-CM | POA: Diagnosis not present

## 2020-07-17 DIAGNOSIS — Z1212 Encounter for screening for malignant neoplasm of rectum: Secondary | ICD-10-CM | POA: Diagnosis not present

## 2020-07-24 ENCOUNTER — Ambulatory Visit: Payer: Medicare Other

## 2020-07-25 DIAGNOSIS — D225 Melanocytic nevi of trunk: Secondary | ICD-10-CM | POA: Diagnosis not present

## 2020-07-25 DIAGNOSIS — L821 Other seborrheic keratosis: Secondary | ICD-10-CM | POA: Diagnosis not present

## 2020-07-25 DIAGNOSIS — D485 Neoplasm of uncertain behavior of skin: Secondary | ICD-10-CM | POA: Diagnosis not present

## 2020-07-25 DIAGNOSIS — D1801 Hemangioma of skin and subcutaneous tissue: Secondary | ICD-10-CM | POA: Diagnosis not present

## 2020-07-25 DIAGNOSIS — L814 Other melanin hyperpigmentation: Secondary | ICD-10-CM | POA: Diagnosis not present

## 2020-07-25 DIAGNOSIS — L570 Actinic keratosis: Secondary | ICD-10-CM | POA: Diagnosis not present

## 2020-07-25 DIAGNOSIS — D2272 Melanocytic nevi of left lower limb, including hip: Secondary | ICD-10-CM | POA: Diagnosis not present

## 2020-07-25 DIAGNOSIS — Q828 Other specified congenital malformations of skin: Secondary | ICD-10-CM | POA: Diagnosis not present

## 2020-07-25 DIAGNOSIS — L84 Corns and callosities: Secondary | ICD-10-CM | POA: Diagnosis not present

## 2020-07-31 ENCOUNTER — Ambulatory Visit (INDEPENDENT_AMBULATORY_CARE_PROVIDER_SITE_OTHER): Payer: Medicare Other | Admitting: *Deleted

## 2020-07-31 ENCOUNTER — Other Ambulatory Visit: Payer: Self-pay

## 2020-07-31 ENCOUNTER — Other Ambulatory Visit: Payer: Self-pay | Admitting: Internal Medicine

## 2020-07-31 VITALS — BP 138/67 | HR 52 | Temp 97.5°F | Resp 18

## 2020-07-31 DIAGNOSIS — F3341 Major depressive disorder, recurrent, in partial remission: Secondary | ICD-10-CM

## 2020-07-31 DIAGNOSIS — J4540 Moderate persistent asthma, uncomplicated: Secondary | ICD-10-CM

## 2020-07-31 DIAGNOSIS — E039 Hypothyroidism, unspecified: Secondary | ICD-10-CM

## 2020-07-31 MED ORDER — LEVOTHYROXINE SODIUM 75 MCG PO TABS: ORAL_TABLET | ORAL | 3 refills | Status: DC

## 2020-07-31 MED ORDER — SERTRALINE HCL 50 MG PO TABS: ORAL_TABLET | ORAL | 3 refills | Status: DC

## 2020-07-31 MED ORDER — METHYLPREDNISOLONE SODIUM SUCC 125 MG IJ SOLR: 125.0000 mg | Freq: Once | INTRAMUSCULAR | Status: DC | PRN

## 2020-07-31 MED ORDER — DIPHENHYDRAMINE HCL 50 MG/ML IJ SOLN: 50.0000 mg | Freq: Once | INTRAMUSCULAR | Status: DC | PRN

## 2020-07-31 MED ORDER — ALBUTEROL SULFATE HFA 108 (90 BASE) MCG/ACT IN AERS: 2.0000 | INHALATION_SPRAY | Freq: Once | RESPIRATORY_TRACT | Status: DC | PRN

## 2020-07-31 MED ORDER — FAMOTIDINE IN NACL 20-0.9 MG/50ML-% IV SOLN: 20.0000 mg | Freq: Once | INTRAVENOUS | Status: DC | PRN

## 2020-07-31 MED ORDER — SODIUM CHLORIDE 0.9 % IV SOLN: Freq: Once | INTRAVENOUS | Status: DC | PRN

## 2020-07-31 MED ORDER — OMALIZUMAB 150 MG/ML ~~LOC~~ SOSY
150.0000 mg | PREFILLED_SYRINGE | Freq: Once | SUBCUTANEOUS | Status: AC
Start: 2020-07-31 — End: 2020-07-31
  Administered 2020-07-31: 150 mg via SUBCUTANEOUS

## 2020-07-31 MED ORDER — EPINEPHRINE 0.3 MG/0.3ML IJ SOAJ: 0.3000 mg | Freq: Once | INTRAMUSCULAR | Status: DC | PRN

## 2020-07-31 NOTE — Progress Notes (Signed)
Diagnosis: Asthma  Provider:  Marshell Garfinkel, MD  Procedure: Injection  Xolair (Omalizumab), Dose: 150 mg, Site: subcutaneous  Discharge: Condition: Good, Destination: Home . AVS provided to patient.   Performed by:  Oren Beckmann, RN

## 2020-08-09 ENCOUNTER — Ambulatory Visit: Payer: Medicare Other | Admitting: Internal Medicine

## 2020-08-10 ENCOUNTER — Other Ambulatory Visit: Payer: Self-pay

## 2020-08-10 ENCOUNTER — Ambulatory Visit (INDEPENDENT_AMBULATORY_CARE_PROVIDER_SITE_OTHER): Payer: Medicare Other | Admitting: Internal Medicine

## 2020-08-10 ENCOUNTER — Encounter: Payer: Self-pay | Admitting: Internal Medicine

## 2020-08-10 DIAGNOSIS — J4540 Moderate persistent asthma, uncomplicated: Secondary | ICD-10-CM | POA: Diagnosis not present

## 2020-08-10 NOTE — Progress Notes (Signed)
Subjective:     Patient ID: Kathy Whitaker, female   DOB: 19-May-1933   MRN: [id number]    Brief patient profile:  64  yowf never smoker with h/o allergic rhinitis/ moderate chronic asthma dating back to  Her late 31s and much better on xolair/performist/budesonide previously under Dr Bettina Gavia care  - on Bonaparte since around 2009 / reduced to monthly rx 04/2015 by Dr Annamaria Boots      History of Present Illness 05/02/2015 1st   office visit/ Kathy Whitaker / transition of care from Dr Joya Gaskins  Chief Complaint  Patient presents with   Follow-up    Former PW pt. Pt c/o occasional SOB with exertion. Pt states that her breathing is doing well. She recently had a cold but is improving. Pt denies cough/wheeze/CP/tightness. Pt is on Xolair and reports no complications/reactions.   maint rx with performist/budesonide rarely need combivent (very poor hfa noted - see a/p) Says other forms of ICS don't work. Concerned about longterm effects of meds  rec Combine your nebulizer solutions and take each am plus second treatment in pm if needed. Work on Doctor, hospital.     11/18/2017  f/u ov/Kathy Whitaker re:  Asthma/ rhinitis on xolair monthly and performist/bud daily  Chief Complaint  Patient presents with   Follow-up    Last seen in 2017.  She is here just for f/u so she can continue on xolair. She denies any co's. She rarely uses her combivent for rescue.   Dyspnea:  Doe x steps on or off xolair (says worse when late for a single dose)  Cough: not a problem Sleeping: fine flat  SABA use:  None / poor understanding of smi technique for combivent  02: none   rec Combine your nebulizer solutions and take each am plus second treatment in pm if needed. Work on inhaler technique:    Only use your a combivent as a rescue medication    06/04/2019  f/u ov/Kathy Whitaker re: asthma/rhinitis on brovana/bud q am with optional second dose Chief Complaint  Patient presents with   Follow-up  Dyspnea:  Not limited by breathing from desired  activities unless does lot of flights    Cough: minimal  Sleeping: bed is flat/one pillow  SABA use: hardly ever uses combivent/ very poor hfa  02: none Rec No change in your medication or xolair injections  Please schedule a follow up visit in 12 months but call sooner if needed  - bring your inhaler with you    08/10/2020  f/u ov/Kathy Whitaker re: asthma/ rhinitis on brovana / bud main / rare saba  Chief Complaint  Patient presents with   Follow-up    Breathing is overall doing well. She has occ cough and wheeze but has not bothered her in about a month. She rarely uses her combivent inhaler.    Dyspnea:  MMRC1 = can walk nl pace, flat grade, can't hurry or go uphills or steps s sob   Cough: resolved one month prior to OV  with neg covid testing Sleeping: flat bed / one pillow SABA use: rarely  02: none Covid status:  Vax x 3    No obvious day to day or daytime variability or assoc excess/ purulent sputum or mucus plugs or hemoptysis or cp or chest tightness, subjective wheeze or overt sinus or hb symptoms.   Sleeps  without nocturnal  or early am exacerbation  of respiratory  c/o's or need for noct saba. Also denies any obvious fluctuation of symptoms with weather  or environmental changes or other aggravating or alleviating factors except as outlined above   No unusual exposure hx or h/o childhood pna/ asthma or knowledge of premature birth.  Current Allergies, Complete Past Medical History, Past Surgical History, Family History, and Social History were reviewed in [employer] record.  ROS  The following are not active complaints unless bolded Hoarseness, sore throat, dysphagia, dental problems, itching, sneezing,  nasal congestion or discharge of excess mucus or purulent secretions, ear ache,   fever, chills, sweats, unintended wt loss or wt gain, classically pleuritic or exertional cp,  orthopnea pnd or arm/hand swelling  or leg swelling, presyncope, palpitations,  abdominal pain, anorexia, nausea, vomiting, diarrhea  or change in bowel habits or change in bladder habits, change in stools or change in urine, dysuria, hematuria,  rash, arthralgias, visual complaints, headache, numbness, weakness or ataxia or problems with walking or coordination,  change in mood or  memory.        Current Meds  Medication Sig   Ascorbic Acid (VITAMIN C PO) Take 500 mg by mouth daily.   aspirin EC 81 MG tablet Take 81 mg by mouth daily.   bisoprolol-hydrochlorothiazide (ZIAC) 5-6.25 MG tablet Take  1 tablet  Daily  for BP   budesonide (PULMICORT) 0.25 MG/2ML nebulizer solution USE 1 VIAL  IN  NEBULIZER TWICE  DAILY - *Rinse Mouth After Use.   Cholecalciferol (VITAMIN D PO) Take 5,000 Units by mouth daily.   dicyclomine (BENTYL) 20 MG tablet Take     1/2 to 1 tablet     4 x /day      before Meals & Bedtime        for Abdominal Cramping , Bloating or Nausea   EPINEPHrine (EPIPEN JR) 0.15 MG/0.3ML injection Inject 0.15 mg into the muscle as needed.   formoterol (PERFOROMIST) 20 MCG/2ML nebulizer solution USE 1 VIAL  IN  NEBULIZER TWICE  DAILY - -Morning and Evening   Ipratropium-Albuterol (COMBIVENT RESPIMAT) 20-100 MCG/ACT AERS respimat USE 1 INHALATION EVERY 6 HOURS AS NEEDED FOR WHEEZING   levothyroxine (SYNTHROID) 75 MCG tablet Take 1 tablet daily on an empty stomach with only water for 30 minutes & no Antacid meds, Calcium or Magnesium for 4 hours & avoid Biotin   loratadine (CLARITIN) 10 MG tablet Take 1 tablet (10 mg total) by mouth daily as needed for allergies.   meclizine (ANTIVERT) 25 MG tablet Take 1/2 to 1 tablet 3 x /day as needed for dizziness / Vertigo   sertraline (ZOLOFT) 50 MG tablet Take  1 tablet  Daily  for mood.   triamcinolone ointment (KENALOG) 0.1 % Apply 1 application topically 2 (two) times daily.   zinc gluconate 50 MG tablet Take 50 mg by mouth daily.   Current Facility-Administered Medications for the 08/10/20 encounter (Office Visit) with [name], MD  Medication   omalizumab Arvid Right) injection 150 mg   omalizumab Arvid Right) injection 150 mg                    Objective:   Physical Exam    08/10/2020          123 06/04/2019        125   11/18/2017     127   05/02/15 136 lb (61.689 kg)  11/29/14 136 lb (61.689 kg)  03/23/14 138 lb (62.596 kg)    Vital signs reviewed  08/10/2020  - Note at rest 02 sats  98% on RA  General appearance:    pleasant amb wf nad   HEENT : pt wearing mask not removed for exam due to covid - 19 concerns.   NECK :  without JVD/Nodes/TM/ nl carotid upstrokes bilaterally   LUNGS: no acc muscle use,  Min barrel  contour chest wall with bilateral  slightly decreased bs s audible wheeze and  without cough on insp or exp maneuvers and min  Hyperresonant  to  percussion bilaterally     CV:  RRR  no s3 or murmur or increase in P2, and no edema   ABD:  soft and nontender with pos end  insp Hoover's  in the supine position. No bruits or organomegaly appreciated, bowel sounds nl  MS:   Nl gait/  ext warm without deformities, calf tenderness, cyanosis or clubbing No obvious joint restrictions   SKIN: warm and dry without lesions    NEURO:  alert, approp, nl sensorium with  no motor or cerebellar deficits apparent.                Assessment:

## 2020-08-10 NOTE — Patient Instructions (Signed)
Work on inhaler technique:  relax and gently blow all the way out then take a nice smooth full deep breath back in, triggering the inhaler at same time you start breathing in.  Hold for up to 5 seconds if you can.  Rinse and gargle with water when done.  If mouth or throat bother you at all,  try brushing teeth/gums/tongue with arm and hammer toothpaste/ make a slurry and gargle and spit out.   No change medications  If you want the MDI (old fashioned) I'll write you a new prescription and ask you to bring it in    Please schedule a follow up visit in 12 months but call sooner if needed

## 2020-08-10 NOTE — Assessment & Plan Note (Addendum)
Onset her late 20's Xolair rx since at least 2009 per EMR  - Spirometry 07/05/08  FEV1 1.60 (100%)  Ratio 62 p 15% resp to saba   - changed to monthly  interval per Dr Annamaria Boots rec as of 05/02/2015    - Spirometry 11/18/2017  FEV1 1,0  (68%)  Ratio 61  p am formoterol/bud - FENO 11/18/2017  =   27 on bud neb  - 08/10/2020  After extensive coaching inhaler device,  effectiveness =   Struggles with SMI   At this point more of an ACOS than asthma but no flares on xolair and prn combivent - she is so poor with technique may need to change devices  eg hfa or respiclick or neb  but so well controlled on xolair plus peformist/bud  no need for saba at this point  F/u can be yearly, sooner prn         Each maintenance medication was reviewed in detail including emphasizing most importantly the difference between maintenance and prns and under what circumstances the prns are to be triggered using an action plan format where appropriate.  Total time for H and P, chart review, counseling, reviewing smi device(s) and generating customized AVS unique to this office visit / same day charting = 35 min

## 2020-08-17 ENCOUNTER — Other Ambulatory Visit: Payer: Self-pay | Admitting: Internal Medicine

## 2020-08-17 DIAGNOSIS — J4530 Mild persistent asthma, uncomplicated: Secondary | ICD-10-CM

## 2020-08-18 ENCOUNTER — Encounter: Payer: Self-pay | Admitting: Adult Health

## 2020-08-18 ENCOUNTER — Ambulatory Visit (INDEPENDENT_AMBULATORY_CARE_PROVIDER_SITE_OTHER): Payer: Medicare Other | Admitting: Adult Health

## 2020-08-18 ENCOUNTER — Other Ambulatory Visit: Payer: Self-pay

## 2020-08-18 VITALS — BP 120/66 | HR 50 | Temp 97.3°F | Wt 122.0 lb

## 2020-08-18 DIAGNOSIS — M25561 Pain in right knee: Secondary | ICD-10-CM

## 2020-08-18 NOTE — Progress Notes (Signed)
Assessment and Plan:  Kathy Whitaker was seen today for fall.  Diagnoses and all orders for this visit:  Acute pain of right knee Likely some baseline arthritis but with low suspicion of acute bony abnormality or meniscal/ligament etiology Suspect some tendonitis/strain Educational materials distributed. Rest, ice, compression, and elevation (RICE) therapy. Reduction in offending activity. Can wear soft sleeve if needed OTC analgesics as needed.- she feels this is working well, will call back if prescription option needed. Take NSAIDs with food/milk on stomach Plain film x-rays. Follow up if not improving in 2 weeks as expected -     DG Knee Complete 4 Views Right; Future  Further disposition pending results of labs. Discussed med's effects and SE's.   Over 15 minutes of exam, counseling, chart review, and critical decision making was performed.   Future Appointments  Date Time Provider [place]  09/01/2020  1:00 PM [facility] None  09/14/2020  2:30 PM Liane Comber, NP [facility] None  10/03/2020  1:00 PM [facility] 2 [facility] None  11/06/2020  1:00 PM [facility] 2 [facility] None  06/13/2021  3:00 PM [name], MD [facility] None  08/10/2021  1:30 PM [name], MD [facility] None    ------------------------------------------------------------------------------------------------------------------   HPI BP 120/66   Pulse (!) 50   Temp (!) 97.3 F (36.3 C)   Wt 122 lb (55.3 kg)   SpO2 97%   BMI 23.83 kg/m  87 y.o.female presents for evaluation of R knee/leg pain.   She reports tripped 1 week and landed on both knees on cement, she was able to get up herself shortly later, but notes since then has been more painful to bear weight or climb stairs with the R knee forward. She notes pain over patella and lateral/posteriorly.   She notes mild throbbing pain 2/10 at rest, worst with climbing stairs with that leg forward. She did take ibuprofen 1 tab last  night and reports significantly improved today.   Past Medical History:  Diagnosis Date   Asthma    COPD (chronic obstructive pulmonary disease) (HCC)    Depression    Dizziness    Hyperlipemia    Hypertension    Hypothyroidism    Prediabetes    SVD (spontaneous vaginal delivery)    x 2   Vitamin D deficiency      Allergies  Allergen Reactions   Codeine Nausea Only   Propoxyphene N-Acetaminophen Nausea Only   Shellfish-Derived Products Rash    Current Outpatient Medications on File Prior to Visit  Medication Sig   Ascorbic Acid (VITAMIN C PO) Take 500 mg by mouth daily.   aspirin EC 81 MG tablet Take 81 mg by mouth daily.   bisoprolol-hydrochlorothiazide (ZIAC) 5-6.25 MG tablet Take  1 tablet  Daily  for BP   budesonide (PULMICORT) 0.25 MG/2ML nebulizer solution USE 1 VIAL  IN  NEBULIZER TWICE  DAILY - *Rinse Mouth After Use.   Cholecalciferol (VITAMIN D PO) Take 5,000 Units by mouth daily.   EPINEPHrine (EPIPEN JR) 0.15 MG/0.3ML injection Inject 0.15 mg into the muscle as needed.   formoterol (PERFOROMIST) 20 MCG/2ML nebulizer solution USE 1 VIAL  IN  NEBULIZER TWICE  DAILY - -Morning and Evening   Ipratropium-Albuterol (COMBIVENT RESPIMAT) 20-100 MCG/ACT AERS respimat USE 1 INHALATION EVERY 6 HOURS AS NEEDED FOR WHEEZING   levothyroxine (SYNTHROID) 75 MCG tablet Take 1 tablet daily on an empty stomach with only water for 30 minutes & no Antacid meds, Calcium or Magnesium for 4 hours &  avoid Biotin   loratadine (CLARITIN) 10 MG tablet Take 1 tablet (10 mg total) by mouth daily as needed for allergies.   meclizine (ANTIVERT) 25 MG tablet Take 1/2 to 1 tablet 3 x /day as needed for dizziness / Vertigo   sertraline (ZOLOFT) 50 MG tablet Take  1 tablet  Daily  for mood.   triamcinolone ointment (KENALOG) 0.1 % Apply 1 application topically 2 (two) times daily.   zinc gluconate 50 MG tablet Take 50 mg by mouth daily.   dicyclomine (BENTYL) 20 MG tablet Take     1/2 to 1 tablet      4 x /day      before Meals & Bedtime        for Abdominal Cramping , Bloating or Nausea (Patient not taking: Reported on 08/18/2020)   Current Facility-Administered Medications on File Prior to Visit  Medication   omalizumab Arvid Right) injection 150 mg   omalizumab Arvid Right) injection 150 mg    ROS: all negative except above.   Physical Exam:  BP 120/66   Pulse (!) 50   Temp (!) 97.3 F (36.3 C)   Wt 122 lb (55.3 kg)   SpO2 97%   BMI 23.83 kg/m   General Appearance: Well nourished, in no apparent distress. Eyes: PERRL, conjunctiva no swelling or erythema ENT/Mouth: Mask in place, Hearing normal.  Neck: Supple Respiratory: Respiratory effort normal Cardio: Appears well perfused, Brisk peripheral pulses without edema.  Musculoskeletal: Full ROM bil knees and hips, 5/5 strength, no tenderness with patella palpation, no bony palpable abnormality, denies pain with compressed manipulation of patella. No effusion or joint line pain. No laxity, No popping clicking with figure 8. Tenderness to palpation of lateral ligament and vaguely through local soft tissues with mild swelling. Mildly antalgic gait.  Skin: Warm, dry without rashes, lesions, ecchymosis.  Neuro: Normal muscle tone, Sensation intact.  Psych: Awake and oriented X 3, normal affect, Insight and Judgment appropriate.     Izora Ribas, NP 9:33 AM [facility]

## 2020-08-18 NOTE — Patient Instructions (Signed)
Xray at [facility] 8-4pm Mon-Friday -no appointment needed    Acute Knee Pain, Adult Many things can cause knee pain. Sometimes, knee pain is sudden (acute) and may be caused by damage, swelling, or irritation of the muscles andtissues that support your knee. The pain often goes away on its own with time and rest. If the pain does not goaway, tests may be done to find out what is causing the pain. Follow these instructions at home: If you have a knee sleeve or brace:  Wear the knee sleeve or brace as told by your doctor. Take it off only as told by your doctor. Loosen it if your toes: Tingle. Become numb. Turn cold and blue. Keep it clean. If the knee sleeve or brace is not waterproof: Do not let it get wet. Cover it with a watertight covering when you take a bath or shower.  Activity Rest your knee. Do not do things that cause pain or make pain worse. Avoid activities where both feet leave the ground at the same time (high-impact activities). Examples are running, jumping rope, and doing jumping jacks. Work with a physical therapist to make a safe exercise program, as told by your doctor. Managing pain, stiffness, and swelling  If told, put ice on the knee. To do this: If you have a removable knee sleeve or brace, take it off as told by your doctor. Put ice in a plastic bag. Place a towel between your skin and the bag. Leave the ice on for 20 minutes, 2-3 times a day. Take off the ice if your skin turns bright red. This is very important. If you cannot feel pain, heat, or cold, you have a greater risk of damage to the area. If told, use an elastic bandage to put pressure (compression) on your injured knee. Raise your knee above the level of your heart while you are sitting or lying down. Sleep with a pillow under your knee.  General instructions Take over-the-counter and prescription medicines only as told by your doctor. Do not smoke or use any  products that contain nicotine or tobacco. If you need help quitting, ask your doctor. If you are overweight, work with your doctor and a food expert (dietitian) to set goals to lose weight. Being overweight can make your knee hurt more. Watch for any changes in your symptoms. Keep all follow-up visits. Contact a doctor if: The knee pain does not stop. The knee pain changes or gets worse. You have a fever along with knee pain. Your knee is red or feels warm when you touch it. Your knee gives out or locks up. Get help right away if: Your knee swells, and the swelling gets worse. You cannot move your knee. You have very bad knee pain that does not get better with pain medicine. Summary Many things can cause knee pain. The pain often goes away on its own with time and rest. Your doctor may do tests to find out the cause of the pain. Watch for any changes in your symptoms. Relieve your pain with rest, medicines, light activity, and use of ice. Get help right away if you cannot move your knee or your knee pain is very bad. This information is not intended to replace advice given to you by your health care provider. Make sure you discuss any questions you have with your healthcare provider. Document Revised: 07/07/2019 Document Reviewed: 07/07/2019 Elsevier Patient Education  2022 [employer].

## 2020-08-21 ENCOUNTER — Ambulatory Visit: Payer: Medicare Other

## 2020-08-25 ENCOUNTER — Encounter: Payer: Self-pay | Admitting: Internal Medicine

## 2020-09-01 ENCOUNTER — Ambulatory Visit (INDEPENDENT_AMBULATORY_CARE_PROVIDER_SITE_OTHER): Payer: Medicare Other

## 2020-09-01 VITALS — BP 125/70 | HR 86 | Temp 97.6°F | Resp 18

## 2020-09-01 DIAGNOSIS — J4540 Moderate persistent asthma, uncomplicated: Secondary | ICD-10-CM | POA: Diagnosis not present

## 2020-09-01 MED ORDER — METHYLPREDNISOLONE SODIUM SUCC 125 MG IJ SOLR: 125.0000 mg | Freq: Once | INTRAMUSCULAR | Status: DC | PRN

## 2020-09-01 MED ORDER — OMALIZUMAB 150 MG/ML ~~LOC~~ SOSY
150.0000 mg | PREFILLED_SYRINGE | Freq: Once | SUBCUTANEOUS | Status: AC
  Administered 2020-09-01: 150 mg via SUBCUTANEOUS
  Filled 2020-09-01: qty 1

## 2020-09-01 MED ORDER — FAMOTIDINE IN NACL 20-0.9 MG/50ML-% IV SOLN: 20.0000 mg | Freq: Once | INTRAVENOUS | Status: DC | PRN

## 2020-09-01 MED ORDER — DIPHENHYDRAMINE HCL 50 MG/ML IJ SOLN: 50.0000 mg | Freq: Once | INTRAMUSCULAR | Status: DC | PRN

## 2020-09-01 MED ORDER — ALBUTEROL SULFATE HFA 108 (90 BASE) MCG/ACT IN AERS: 2.0000 | INHALATION_SPRAY | Freq: Once | RESPIRATORY_TRACT | Status: DC | PRN

## 2020-09-01 MED ORDER — EPINEPHRINE 0.3 MG/0.3ML IJ SOAJ: 0.3000 mg | Freq: Once | INTRAMUSCULAR | Status: DC | PRN

## 2020-09-01 MED ORDER — SODIUM CHLORIDE 0.9 % IV SOLN: Freq: Once | INTRAVENOUS | Status: DC | PRN

## 2020-09-01 NOTE — Progress Notes (Signed)
Diagnosis: Asthma  Provider:  Marshell Garfinkel, MD  Procedure: Injection  Xolair (Omalizumab), Dose: 150 mg, Site: subcutaneous  Discharge: Condition: Good, Destination: Home . AVS provided to patient.   Performed by:  Nazirah Tri, Sherlon Handing, LPN

## 2020-09-13 NOTE — Progress Notes (Signed)
MEDICARE ANNUAL WELLNESS VISIT AND FOLLOW UP Assessment:   Kathy Whitaker was seen today for medicare wellness.  Diagnoses and all orders for this visit:  Encounter for Medicare annual wellness exam      -  Essential hypertension -     CBC with Differential/Platelet - Monitor BP daily , if BP < 130/80 does not need to take Ziac 5/6.25 but will take if greater than 130/80  Hyperlipidemia, mixed -     COMPLETE METABOLIC PANEL WITH GFR -     Lipid panel  Hypothyroidism, unspecified type -     TSH  Depression, major, recurrent, in partial remission (HCC)     -  Continue Zoloft 50 mg QD and monitor symptoms Extrinsic asthma, moderate persistent, uncomplicated       - Dr. Marlene Lard pulmonology gets shot once a month, uses nebulizer daily with combivent and perforomist  Gastroesophageal reflux disease, unspecified whether esophagitis present      - Diet controlled at present  Abnormal glucose       - Continue to monitor diet and encouraged activity and exercise daily  Medication management -     CBC with Differential/Platelet -     COMPLETE METABOLIC PANEL WITH GFR -     Lipid panel -     TSH  Balance problem/Vertigo -     Ambulatory referral to Physical Therapy- 2 falls in the past year and states feels off balance frequently - Meclizine PRN    Follow Up Instructions:   I discussed the assessment and treatment plan with the patient. The patient was provided an opportunity to ask questions and all were answered. The patient agreed with the plan and demonstrated an understanding of the instructions.   The patient was advised to call back or seek an in-person evaluation if the symptoms worsen or if the condition fails to improve as anticipated.  I provided 30 minutes of face-to-face time during this encounter including counseling, chart review, and critical decision making was performed.  Future Appointments  Date Time Provider [place]  10/03/2020  1:00 PM [facility] 2  [facility] None  11/06/2020  1:00 PM [facility]CHAIR 2 [facility] None  06/13/2021  3:00 PM [name], MD [facility] None  08/10/2021  1:30 PM [name], MD [facility] None  09/17/2021  2:30 PM Magda Bernheim, NP [facility] None     Plan:   During the course of the visit the patient was educated and counseled about appropriate screening and preventive services including:   Pneumococcal vaccine  Influenza vaccine Prevnar 13 Td vaccine Screening electrocardiogram, deferred, COVID19, telephone visit. Colorectal cancer screening Diabetes screening Glaucoma screening Nutrition counseling    Subjective:  Kathy Whitaker is a 87 y.o. female who presents for Medicare Annual Wellness Visit and 3 month follow up for hypothyroidism, hyperlipidemia, abnormal glucose, depression.  Her husband passed away in 03-05-20 and is having some days when she feels down but on the whole feels she is coping well. On zoloft 50 mg daily for mood and feels symptoms are currently well controlled.    Patient has history of Asthma/allergies, follows with Dr. Marlene Lard. She is on pulmicort/performist once a day, combivent PRN, 2-3 times per month, and claritin and xolair injections that have helped significantly. Cold weather can be problematic but doing well recently.   Following with neurologist, Dr. Krista Blue, for vertigo. MRI on 12/14/2016 was unremarkable excepting age-appropriate microvascular changes. She reports symptoms have since improved, meclizine does improve symptoms significantly.   BMI is  Body mass index is 23.75 kg/m., she has been working on diet , repots generally active but no current intentional exercise, plans to start walking.  Wt Readings from Last 3 Encounters:  09/14/20 121 lb 9.6 oz (55.2 kg)  08/18/20 122 lb (55.3 kg)  08/10/20 123 lb (55.8 kg)   Her blood pressure has been controlled at home, today their BP is BP: (!) 112/50.  She denies chest pain, shortness of breath.  BP Readings from Last 3  Encounters:  09/14/20 (!) 112/50  09/01/20 125/70  08/18/20 120/66     She is on cholesterol medication  (simvastatin 40 mg daily) and denies myalgias. Her cholesterol is at goal. The cholesterol last visit was:   Lab Results  Component Value Date   CHOL 236 (H) 06/12/2020   HDL 67 06/12/2020   LDLCALC 145 (H) 06/12/2020   TRIG 118 06/12/2020   CHOLHDL 3.5 06/12/2020   She has not been working on diet and exercise for abnormal glucose, and denies hyperglycemia, hypoglycemia , increased appetite, nausea, paresthesia of the feet, polydipsia, polyuria, visual disturbances and vomiting. Last A1C in the office was:  Lab Results  Component Value Date   HGBA1C 5.4 06/12/2020   Last GFR Lab Results  Component Value Date   GFRNONAA 71 06/12/2020   She is on thyroid medication. Her medication was not changed last visit.   Lab Results  Component Value Date   TSH 1.81 06/12/2020    Patient is on Vitamin D supplement.   Lab Results  Component Value Date   VD25OH 68 06/12/2020      Medication Review:  Current Outpatient Medications (Endocrine & Metabolic):    levothyroxine (SYNTHROID) 75 MCG tablet, Take 1 tablet daily on an empty stomach with only water for 30 minutes & no Antacid meds, Calcium or Magnesium for 4 hours & avoid Biotin   Current Outpatient Medications (Cardiovascular):    bisoprolol-hydrochlorothiazide (ZIAC) 5-6.25 MG tablet, Take  1 tablet  Daily  for BP   EPINEPHrine (EPIPEN JR) 0.15 MG/0.3ML injection, Inject 0.15 mg into the muscle as needed.   Current Outpatient Medications (Respiratory):    budesonide (PULMICORT) 0.25 MG/2ML nebulizer solution, USE 1 VIAL  IN  NEBULIZER TWICE  DAILY - rinse mouth after treatment   formoterol (PERFOROMIST) 20 MCG/2ML nebulizer solution, USE 1 VIAL  IN  NEBULIZER TWICE  DAILY - morning and evening   Ipratropium-Albuterol (COMBIVENT RESPIMAT) 20-100 MCG/ACT AERS respimat, USE 1 INHALATION EVERY 6 HOURS AS NEEDED FOR WHEEZING    loratadine (CLARITIN) 10 MG tablet, Take 1 tablet (10 mg total) by mouth daily as needed for allergies.  Current Facility-Administered Medications (Respiratory):    omalizumab Arvid Right) injection 150 mg   omalizumab Arvid Right) injection 150 mg  Current Outpatient Medications (Analgesics):    aspirin EC 81 MG tablet, Take 81 mg by mouth daily.     Current Outpatient Medications (Other):    Ascorbic Acid (VITAMIN C PO), Take 500 mg by mouth daily.   Cholecalciferol (VITAMIN D PO), Take 5,000 Units by mouth daily.   dicyclomine (BENTYL) 20 MG tablet, Take     1/2 to 1 tablet     4 x /day      before Meals & Bedtime        for Abdominal Cramping , Bloating or Nausea (Patient taking differently: Take     1/2 to 1 tablet     4 x /day      before Meals & Bedtime  for Abdominal Cramping , Bloating or Nausea)   meclizine (ANTIVERT) 25 MG tablet, Take 1/2 to 1 tablet 3 x /day as needed for dizziness / Vertigo   sertraline (ZOLOFT) 50 MG tablet, Take  1 tablet  Daily  for mood.   zinc gluconate 50 MG tablet, Take 50 mg by mouth daily.   Allergies: Allergies  Allergen Reactions   Codeine Nausea Only   Propoxyphene N-Acetaminophen Nausea Only   Shellfish-Derived Products Rash    Current Problems (verified) has Extrinsic asthma; GERD; Abnormal glucose; Vitamin D deficiency; Hypothyroidism; Hyperlipidemia, mixed; Essential hypertension; Medication management; BMI 24.0-24.9, adult; Encounter for Medicare annual wellness exam; Vertigo; Hearing loss; Depression, major, recurrent, in partial remission ([place]); Osteoporosis; Extrinsic asthma, moderate persistent, uncomplicated; and Labile hypertension on their problem list.  Screening Tests Immunization History  Administered Date(s) Administered   Influenza Whole 10/05/2009, 11/26/2010, 11/05/2011   Influenza, High Dose Seasonal PF 11/20/2012, 11/22/2013, 11/21/2014, 11/10/2015, 11/19/2016, 11/20/2017, 11/10/2018, 11/17/2019   PFIZER(Purple  Top)SARS-COV-2 Vaccination 04/04/2019, 04/28/2019   Pneumococcal Conjugate-13 11/22/2013   Pneumococcal Polysaccharide-23 09/14/2008   Td 10/09/2004, 03/12/2017   Zoster, Live 02/15/2013    Preventative care: Last colonoscopy: 2015 Mammogram: 01/11/2019 negative , repeats q 2 years  DEXA: GYN follows, report requested   Prior vaccinations:  TD or Tdap: 2019  Influenza: 11/2018 Pneumococcal: 2010 Prevnar13: 2015  Shingles/Zostavax: 2015 Covid 19: 2/2, 2021, pfizer  Names of Other Physician/Practitioners you currently use: 1. [facility] here for primary care 2. Eye Exam: Dr. Katy Fitch, last 2022,  3. Dential Exam: 2022, goes q58m Patient Care Team: MUnk Pinto MD as PCP - General (Internal Medicine) MArvella Nigh MD as Consulting Physician (Obstetrics and Gynecology) WChristinia Gully, MD (Pulmonology)- last visit 08/2020  Surgical: She  has a past surgical history that includes Tubal ligation (Bilateral, 1970); Tonsillectomy; Wisdom tooth extraction; Dilatation & currettage/hysteroscopy with resectoscope (N/A, 09/10/2013); and Cataract extraction (Right, 09/03/2019). Family Her family history includes Cancer in her brother; Gout in her brother; Hodgkin's lymphoma in her brother; Other in her father; Thyroid disease in her mother. Social history  She reports that she has never smoked. She has never used smokeless tobacco. She reports current alcohol use. She reports that she does not use drugs.  MEDICARE WELLNESS OBJECTIVES: Physical activity: Current Exercise Habits: Home exercise routine;The patient does not participate in regular exercise at present, Exercise limited by: orthopedic condition(s);Other - see comments;respiratory conditions(s) Cardiac risk factors: Cardiac Risk Factors include: advanced age (>555m, >6>25omen);hypertension;sedentary lifestyle Depression/mood screen:   [facility] 09/14/2020  Decreased Interest 1   Down, Depressed, Hopeless 1  PHQ - 2 Score 2  Altered sleeping 1  Tired, decreased energy 0  Change in appetite 1  Feeling bad or failure about yourself  0  Trouble concentrating 0  Moving slowly or fidgety/restless 0  Suicidal thoughts 0  PHQ-9 Score 4  Difficult doing work/chores Not difficult at all  Some recent data might be hidden    ADLs:  In your present state of health, do you have any difficulty performing the following activities: 09/14/2020 06/11/2020  H[place]? [place]  Difficulty concentrating or making decisions? N N  Walking or climbing stairs? N N  Dressing or bathing? N N  Doing errands, shopping? N N  Some recent data might be hidden     Cognitive Testing  Alert? Yes  Normal Appearance?Yes  Oriented to person? Yes  Place? Yes   Time?  Yes  Recall of three objects?  Yes  Can perform simple calculations? Yes  Displays appropriate judgment?Yes  Can read the correct time from a watch face?Yes  EOL planning: Does Patient Have a Medical Advance Directive?: Yes Type of Advance Directive: [place] Does patient want to make changes to medical advance directive?: No - Patient declined Copy of [place] in Chart?: Yes - validated most recent copy scanned in chart (See row information)   Objective:   Today's Vitals   09/14/20 1432  BP: (!) 112/50  Pulse: (!) 55  Temp: (!) 97.5 F (36.4 C)  SpO2: 98%  Weight: 121 lb 9.6 oz (55.2 kg)    Body mass index is 23.75 kg/m.  General Appearance: Well nourished, in no apparent distress. Eyes: PERRLA, EOMs, conjunctiva no swelling or erythema Sinuses: No Frontal/maxillary tenderness ENT/Mouth: Ext aud canals clear, TMs without erythema, bulging. No erythema, swelling, or exudate on post pharynx.  Tonsils not swollen or erythematous. Hearing normal.  Neck: Supple, thyroid normal.  Respiratory: Respiratory effort normal, BS equal bilaterally without rales, rhonchi,  wheezing or stridor.  Cardio: RRR with no MRGs. Brisk peripheral pulses without edema.  Abdomen: Soft, + BS.  Non tender, no guarding, rebound, hernias, masses. Lymphatics: Non tender without lymphadenopathy.  Musculoskeletal: Full ROM, no effusions, 5/5 strength, Normal gait Skin: Warm, dry without rashes, lesions, ecchymosis.  Neuro: Cranial nerves intact. No cerebellar symptoms.  Psych: Awake and oriented X 3, normal affect, Insight and Judgment appropriate.   Medicare Attestation I have personally reviewed: The patient's medical and social history Their use of alcohol, tobacco or illicit drugs Their current medications and supplements The patient's functional ability including ADLs,fall risks, home safety risks, cognitive, and hearing and visual impairment Diet and physical activities Evidence for depression or mood disorders  The patient's weight, height, BMI, have been recorded in the chart.  I have made referrals, counseling, and provided education to the patient based on review of the above and I have provided the patient with a written personalized care plan for preventive services.     Magda Bernheim ANP-C  [facility]  09/14/2020

## 2020-09-14 ENCOUNTER — Ambulatory Visit (INDEPENDENT_AMBULATORY_CARE_PROVIDER_SITE_OTHER): Payer: Medicare Other | Admitting: Nurse Practitioner

## 2020-09-14 ENCOUNTER — Other Ambulatory Visit: Payer: Self-pay

## 2020-09-14 ENCOUNTER — Encounter: Payer: Self-pay | Admitting: Nurse Practitioner

## 2020-09-14 VITALS — BP 112/50 | HR 55 | Temp 97.5°F | Wt 121.6 lb

## 2020-09-14 DIAGNOSIS — R7309 Other abnormal glucose: Secondary | ICD-10-CM

## 2020-09-14 DIAGNOSIS — J4540 Moderate persistent asthma, uncomplicated: Secondary | ICD-10-CM | POA: Diagnosis not present

## 2020-09-14 DIAGNOSIS — E782 Mixed hyperlipidemia: Secondary | ICD-10-CM

## 2020-09-14 DIAGNOSIS — R2689 Other abnormalities of gait and mobility: Secondary | ICD-10-CM | POA: Diagnosis not present

## 2020-09-14 DIAGNOSIS — E039 Hypothyroidism, unspecified: Secondary | ICD-10-CM

## 2020-09-14 DIAGNOSIS — Z79899 Other long term (current) drug therapy: Secondary | ICD-10-CM

## 2020-09-14 DIAGNOSIS — F3341 Major depressive disorder, recurrent, in partial remission: Secondary | ICD-10-CM

## 2020-09-14 DIAGNOSIS — K219 Gastro-esophageal reflux disease without esophagitis: Secondary | ICD-10-CM | POA: Diagnosis not present

## 2020-09-14 DIAGNOSIS — Z0001 Encounter for general adult medical examination with abnormal findings: Secondary | ICD-10-CM

## 2020-09-14 DIAGNOSIS — E559 Vitamin D deficiency, unspecified: Secondary | ICD-10-CM

## 2020-09-14 DIAGNOSIS — R6889 Other general symptoms and signs: Secondary | ICD-10-CM | POA: Diagnosis not present

## 2020-09-14 DIAGNOSIS — Z0000 Encounter for general adult medical examination without abnormal findings: Secondary | ICD-10-CM

## 2020-09-14 DIAGNOSIS — I10 Essential (primary) hypertension: Secondary | ICD-10-CM

## 2020-09-14 NOTE — Patient Instructions (Signed)
Monitor blood pressure daily, if greater than 130/80 take Ziac for blood pressure.  If not above that level of 130/80 you can not take the medication that day.  Physical therapy referral is sent in for balance difficulties with falls as well as vertigo.  GENERAL HEALTH GOALS   Know what a healthy weight is for you (roughly BMI <25) and aim to maintain this   Aim for 7+ servings of fruits and vegetables daily   70-80+ fluid ounces of water or unsweet tea for healthy kidneys   Limit to max 1 drink of alcohol per day; avoid smoking/tobacco   Limit animal fats in diet for cholesterol and heart health - choose grass fed whenever available   Avoid highly processed foods, and foods high in saturated/trans fats   Aim for low stress - take time to unwind and care for your mental health   Aim for 150 min of moderate intensity exercise weekly for heart health, and weights twice weekly for bone health   Aim for 7-9 hours of sleep daily

## 2020-09-15 LAB — CBC WITH DIFFERENTIAL/PLATELET
Absolute Monocytes: 580 cells/uL (ref 200–950)
Basophils Absolute: 67 cells/uL (ref 0–200)
Basophils Relative: 1.1 %
Eosinophils Absolute: 153 cells/uL (ref 15–500)
Eosinophils Relative: 2.5 %
HCT: 39.8 % (ref 35.0–45.0)
Hemoglobin: 12.7 g/dL (ref 11.7–15.5)
Lymphs Abs: 1525 cells/uL (ref 850–3900)
MCH: 29.1 pg (ref 27.0–33.0)
MCHC: 31.9 g/dL — ABNORMAL LOW (ref 32.0–36.0)
MCV: 91.3 fL (ref 80.0–100.0)
MPV: 11.8 fL (ref 7.5–12.5)
Monocytes Relative: 9.5 %
Neutro Abs: 3776 cells/uL (ref 1500–7800)
Neutrophils Relative %: 61.9 %
Platelets: 266 10*3/uL (ref 140–400)
RBC: 4.36 10*6/uL (ref 3.80–5.10)
RDW: 13.1 % (ref 11.0–15.0)
Total Lymphocyte: 25 %
WBC: 6.1 10*3/uL (ref 3.8–10.8)

## 2020-09-15 LAB — COMPLETE METABOLIC PANEL WITH GFR
AG Ratio: 1.5 (calc) (ref 1.0–2.5)
ALT: 9 U/L (ref 6–29)
AST: 16 U/L (ref 10–35)
Albumin: 4 g/dL (ref 3.6–5.1)
Alkaline phosphatase (APISO): 80 U/L (ref 37–153)
BUN: 12 mg/dL (ref 7–25)
CO2: 31 mmol/L (ref 20–32)
Calcium: 9.6 mg/dL (ref 8.6–10.4)
Chloride: 101 mmol/L (ref 98–110)
Creat: 0.76 mg/dL (ref 0.60–0.95)
Globulin: 2.6 g/dL (calc) (ref 1.9–3.7)
Glucose, Bld: 89 mg/dL (ref 65–99)
Potassium: 4.6 mmol/L (ref 3.5–5.3)
Sodium: 138 mmol/L (ref 135–146)
Total Bilirubin: 0.4 mg/dL (ref 0.2–1.2)
Total Protein: 6.6 g/dL (ref 6.1–8.1)
eGFR: 77 mL/min/{1.73_m2} (ref >=60)

## 2020-09-15 LAB — LIPID PANEL
Cholesterol: 235 mg/dL — ABNORMAL HIGH (ref <200)
HDL: 56 mg/dL (ref >=50)
LDL Cholesterol (Calc): 148 mg/dL (calc) — ABNORMAL HIGH
Non-HDL Cholesterol (Calc): 179 mg/dL (calc) — ABNORMAL HIGH (ref <130)
Total CHOL/HDL Ratio: 4.2 (calc) (ref <5.0)
Triglycerides: 177 mg/dL — ABNORMAL HIGH (ref <150)

## 2020-09-15 LAB — TSH: TSH: 2.08 mIU/L (ref 0.40–4.50)

## 2020-09-18 ENCOUNTER — Ambulatory Visit: Payer: Medicare Other

## 2020-10-02 NOTE — Progress Notes (Signed)
Diagnosis: Asthma  Provider:  Marshell Garfinkel, MD  Procedure: Injection  Xolair (Omalizumab), Dose: 150 mg, Site: subcutaneous  Discharge: Condition: Good, Destination: Home . AVS provided to patient.   Performed by:  Durward Parcel LPN

## 2020-10-03 ENCOUNTER — Ambulatory Visit (INDEPENDENT_AMBULATORY_CARE_PROVIDER_SITE_OTHER): Payer: Medicare Other

## 2020-10-03 ENCOUNTER — Other Ambulatory Visit: Payer: Self-pay

## 2020-10-03 VITALS — BP 111/62 | HR 69 | Temp 98.4°F | Resp 16

## 2020-10-03 DIAGNOSIS — J4540 Moderate persistent asthma, uncomplicated: Secondary | ICD-10-CM

## 2020-10-03 MED ORDER — SODIUM CHLORIDE 0.9 % IV SOLN: Freq: Once | INTRAVENOUS | Status: DC | PRN

## 2020-10-03 MED ORDER — METHYLPREDNISOLONE SODIUM SUCC 125 MG IJ SOLR: 125.0000 mg | Freq: Once | INTRAMUSCULAR | Status: DC | PRN

## 2020-10-03 MED ORDER — OMALIZUMAB 150 MG/ML ~~LOC~~ SOSY
150.0000 mg | PREFILLED_SYRINGE | Freq: Once | SUBCUTANEOUS | Status: AC
  Administered 2020-10-03: 150 mg via SUBCUTANEOUS
  Filled 2020-10-03: qty 1

## 2020-10-03 MED ORDER — DIPHENHYDRAMINE HCL 50 MG/ML IJ SOLN: 50.0000 mg | Freq: Once | INTRAMUSCULAR | Status: DC | PRN

## 2020-10-03 MED ORDER — ALBUTEROL SULFATE HFA 108 (90 BASE) MCG/ACT IN AERS: 2.0000 | INHALATION_SPRAY | Freq: Once | RESPIRATORY_TRACT | Status: DC | PRN

## 2020-10-03 MED ORDER — EPINEPHRINE 0.3 MG/0.3ML IJ SOAJ: 0.3000 mg | Freq: Once | INTRAMUSCULAR | Status: DC | PRN

## 2020-10-03 MED ORDER — FAMOTIDINE IN NACL 20-0.9 MG/50ML-% IV SOLN: 20.0000 mg | Freq: Once | INTRAVENOUS | Status: DC | PRN

## 2020-10-03 NOTE — Progress Notes (Signed)
Diagnosis: Asthma  Provider:  Marshell Garfinkel, MD  Procedure: Injection  Xolair (Omalizumab), Dose: 150 mg, Site: subcutaneous  Discharge: Condition: Good, Destination: Home . AVS provided to patient.   Performed by:  Glennie Rodda, Sherlon Handing, LPN

## 2020-10-05 ENCOUNTER — Encounter: Payer: Self-pay | Admitting: Physical Therapy

## 2020-10-05 ENCOUNTER — Ambulatory Visit: Payer: Medicare Other | Attending: Nurse Practitioner | Admitting: Physical Therapy

## 2020-10-05 ENCOUNTER — Other Ambulatory Visit: Payer: Self-pay

## 2020-10-05 DIAGNOSIS — R2681 Unsteadiness on feet: Secondary | ICD-10-CM | POA: Diagnosis not present

## 2020-10-05 DIAGNOSIS — R2689 Other abnormalities of gait and mobility: Secondary | ICD-10-CM | POA: Insufficient documentation

## 2020-10-05 NOTE — Therapy (Addendum)
[place], [place], [address] Phone: [phone]   Fax:  [phone]  Physical Therapy Evaluation/Discharge  Patient Details  Name: Kathy Whitaker MRN: [id number] Date of Birth: 10-25-1933 Referring Provider (PT): Marta Lamas, NP   Encounter Date: 10/05/2020   PT End of Session - 10/05/20 1501     Visit Number 1    Number of Visits 8    Date for PT Re-Evaluation 11/30/20    Authorization Type MCR, [employer]    PT Start Time 1230    PT Stop Time 1316    PT Time Calculation (min) 46 min    Activity Tolerance Patient tolerated treatment well    Behavior During Therapy [facility] for tasks assessed/performed             Past Medical History:  Diagnosis Date   Asthma    COPD (chronic obstructive pulmonary disease) ([place])    Depression    Dizziness    Hyperlipemia    Hypertension    Hypothyroidism    Prediabetes    SVD (spontaneous vaginal delivery)    x 2   Vitamin D deficiency     Past Surgical History:  Procedure Laterality Date   CATARACT EXTRACTION Right 09/03/2019   Dr. Katy Fitch   DILATATION & CURRETTAGE/HYSTEROSCOPY WITH RESECTOCOPE N/A 09/10/2013   Procedure: DILATATION & CURETTAGE/HYSTEROSCOPY WITH RESECTOCOPE;  Surgeon: Darlyn Chamber, MD;  Location: [facility];  Service: Gynecology;  Laterality: N/A;   TONSILLECTOMY     TUBAL LIGATION Bilateral 1970   WISDOM TOOTH EXTRACTION      There were no vitals filed for this visit.    Subjective Assessment - 10/05/20 1238     Subjective Patient saw her Primary and mentioned to her she fell, has issues with her balance. Sometimes whe she is walking she feels unsteady.  She fell about 2-3 mos ago. when  her cat went out the door and she reached to catch her and fell on the concrete on both knees. She feels her symptoms are getting worse.  She does have a history of vertigo.  She has to take increased time when getting up in the AM, sit .  She does report occ leg  pain at night when trying to rest, but no pain consistently.    Pertinent History vertigo, COPD    Limitations Other (comment);House hold activities   gardening and housework, standaing   Patient Stated Goals Improve balance    Currently in Pain? No/denies                [facility] PT Assessment - 10/05/20 0001       Assessment   Medical Diagnosis balance problem    Referring Provider (PT) Marta Lamas, NP    Onset Date/Surgical Date --   chronic, > 1 yr   Next MD Visit as needed    Prior Therapy No      Precautions   Precautions None      Restrictions   Weight Bearing Restrictions No      Balance Screen   Has the patient fallen in the past 6 months Yes    How many times? 1    Has the patient had a decrease in activity level because of a fear of falling?  Yes    Is the patient reluctant to leave their home because of a fear of falling?  No      Home Ecologist residence  Living Arrangements Alone    Type of [place] to enter    Entrance Stairs-Number of Steps 3    Additional Comments widowed      Prior Function   Level of Independence Independent    Vocation Retired    Scientist, research (life sciences)    Leisure gardening, cats, reading      Cognition   Overall Cognitive Status Within Functional Limits for tasks assessed      Observation/Other Assessments   Focus on Therapeutic Outcomes (FOTO)  NT on eval      Sensation   Light Touch Appears Intact      Coordination   Gross Motor Movements are Fluid and Coordinated Not tested      Squat   Comments WNL      Single Leg Stance   Comments < 10 sec      Sit to Stand   Comments 5 x STS 13 sec      Posture/Postural Control   Posture/Postural Control Postural limitations    Postural Limitations Rounded Shoulders;Forward head      Transfers   Five time sit to stand comments  13 sec      Ambulation/Gait   Gait velocity 1.1 m/s      Berg Balance Test    Sit to Stand Able to stand without using hands and stabilize independently    Standing Unsupported Able to stand safely 2 minutes    Sitting with Back Unsupported but Feet Supported on Floor or Stool Able to sit safely and securely 2 minutes    Stand to Sit Sits safely with minimal use of hands    Transfers Able to transfer safely, minor use of hands    Standing Unsupported with Eyes Closed Able to stand 10 seconds safely    Standing Unsupported with Feet Together Able to place feet together independently and stand 1 minute safely    From Standing, Reach Forward with Outstretched Arm Can reach confidently >25 cm (10")    From Standing Position, Pick up Object from Floor Able to pick up shoe safely and easily    From Standing Position, Turn to Look Behind Over each Shoulder Looks behind from both sides and weight shifts well    Turn 360 Degrees Able to turn 360 degrees safely in 4 seconds or less    Standing Unsupported, Alternately Place Feet on Step/Stool Able to stand independently and safely and complete 8 steps in 20 seconds    Standing Unsupported, One Foot in Front Able to place foot tandem independently and hold 30 seconds    Standing on One Leg Able to lift leg independently and hold 5-10 seconds    Total Score 55      Dynamic Gait Index   Level Surface Normal    Change in Gait Speed Normal    Gait with Horizontal Head Turns Severe Impairment    Gait with Vertical Head Turns Severe Impairment    Gait and Pivot Turn Mild Impairment    Step Over Obstacle Normal    Step Around Obstacles Normal    Steps Normal    Total Score 17    DGI comment: with head turns, patient leans posteriorly, feet turn inward and trunk follows her head, difficult to regain control and min A                        Objective measurements completed on examination: See above  findings.               PT Education - 10/05/20 1459     Education Details PT/POC, Neuro vs ortho,  vertigo, gaze stabilization causes of balance issues with gait, HEP    Person(s) Educated Patient    Methods Explanation;Handout    Comprehension Verbalized understanding;Returned demonstration              PT Short Term Goals - 10/05/20 1502       PT SHORT TERM GOAL #1   Title Pt will be able to show independence with HEP for balance    Time 2    Period Weeks    Status New    Target Date 10/19/20      PT SHORT TERM GOAL #2   Title Pt will be able to feel more confidence when feeding the cat, bending forward/squatting.    Time 4    Period Weeks    Status New    Target Date 11/02/20      PT SHORT TERM GOAL #3   Title Pt will be able to understand FOTO score for balance and goal for DC.    Baseline unable to do on eval    Time 4    Period Weeks    Status New    Target Date 10/19/20               PT Long Term Goals - 10/05/20 1512       PT LONG TERM GOAL #1   Title Pt will improve her DGI score to 22/24 to demo a lower risk of falls and improved balance.    Baseline 17/24    Time 8    Period Weeks    Status New    Target Date 11/30/20      PT LONG TERM GOAL #2   Title Pt will be able to show I with HEP for balance upon discharge    Time 8    Period Weeks    Status New    Target Date 11/30/20      PT LONG TERM GOAL #3   Title Pt will able to report feeling more steady, improved confidence when walking in either her home or community    Time 8    Period Weeks    Status New    Target Date 11/30/20      PT LONG TERM GOAL #4   Title Further goals TBA once patient at [facility] center                    Plan - 10/05/20 1518     Clinical Impression Statement Patient presents for low complexity eval of balance isues and gait instability.  She did quite well with static balance testing Merrilee Jansky 55/56) but when challenged with DGI, she was unable to maintain a normal course of gait with head turns.  She required increased cues and min A to avoid  falling and regain balance. This combined with her history of vertigo, I believe she would be better served at our Neuro clinic. She was given a basic HEP for her to safely work on single leg, tandem and light supported head movements. She will await a call from Neuro to schedule subsequent treatments.    Personal Factors and Comorbidities Comorbidity 1    Examination-Activity Limitations Squat;Lift;Locomotion Level;Transfers    Examination-Participation Restrictions Meal Prep;Other;Community Activity    Stability/Clinical Decision Making Stable/Uncomplicated    Clinical Decision  Making Low    Rehab Potential Excellent    PT Frequency 1x / week    PT Treatment/Interventions ADLs/Self Care Home Management;Therapeutic exercise;Balance training;Neuromuscular re-education;Functional mobility training;Vestibular    PT Next Visit Plan balance HEP, further testing for vestibular issues and balance deficits    PT Home Exercise Plan SLS, tandem and narrow head turns             Patient will benefit from skilled therapeutic intervention in order to improve the following deficits and impairments:  Decreased balance, Decreased mobility, Decreased coordination  Visit Diagnosis: Other abnormalities of gait and mobility  Unsteadiness on feet     Problem List Patient Active Problem List   Diagnosis Date Noted   Labile hypertension 06/11/2020   Extrinsic asthma, moderate persistent, uncomplicated 71/21/9758   Osteoporosis 09/16/2019   Depression, major, recurrent, in partial remission ([place]) 12/09/2016   Vertigo 12/03/2016   Hearing loss 12/03/2016   BMI 24.0-24.9, adult 11/29/2014   Encounter for Medicare annual wellness exam 11/29/2014   Essential hypertension 05/21/2013   Medication management 05/21/2013   Abnormal glucose    Vitamin D deficiency    Hypothyroidism    Hyperlipidemia, mixed    Extrinsic asthma 05/07/2007   GERD 11/12/2006    Odis Turck 10/05/2020, 3:27 PM  [facility] [address] [place], [place], [address] Phone: [phone]   Fax:  [phone]  Name: Kaitlynne Wenz MRN: [id number] Date of Birth: Mar 02, 1933  Raeford Razor, PT 10/05/20 3:27 PM Phone: [phone] Fax: [phone]   PHYSICAL THERAPY DISCHARGE SUMMARY  Visits from Start of Care: 1  Current functional level related to goals / functional outcomes: See above    Remaining deficits: See above   Education / Equipment: Balance    Patient agrees to discharge. Patient goals were not met. Patient is being discharged due to not returning since the last visit.  Raeford Razor, PT 01/08/21 8:34 AM Phone: [phone] Fax: [phone]

## 2020-10-16 ENCOUNTER — Ambulatory Visit: Payer: Medicare Other

## 2020-10-23 ENCOUNTER — Ambulatory Visit: Payer: Medicare Other

## 2020-11-02 ENCOUNTER — Encounter: Payer: Self-pay | Admitting: Internal Medicine

## 2020-11-05 ENCOUNTER — Encounter: Payer: Self-pay | Admitting: Internal Medicine

## 2020-11-06 ENCOUNTER — Telehealth: Payer: Self-pay

## 2020-11-06 ENCOUNTER — Ambulatory Visit: Payer: Medicare Other

## 2020-11-06 NOTE — Telephone Encounter (Signed)
Patient was scheduled today for 1pm. NO showed. I called, no answer, left a message to call back. Thanks!

## 2020-11-07 ENCOUNTER — Ambulatory Visit (INDEPENDENT_AMBULATORY_CARE_PROVIDER_SITE_OTHER): Payer: Medicare Other

## 2020-11-07 ENCOUNTER — Other Ambulatory Visit: Payer: Self-pay

## 2020-11-07 VITALS — BP 163/83 | HR 53 | Temp 98.4°F | Resp 18 | Ht 60.0 in | Wt 121.4 lb

## 2020-11-07 DIAGNOSIS — J4540 Moderate persistent asthma, uncomplicated: Secondary | ICD-10-CM

## 2020-11-07 MED ORDER — DIPHENHYDRAMINE HCL 50 MG/ML IJ SOLN: 50.0000 mg | Freq: Once | INTRAMUSCULAR | Status: DC | PRN

## 2020-11-07 MED ORDER — ALBUTEROL SULFATE HFA 108 (90 BASE) MCG/ACT IN AERS: 2.0000 | INHALATION_SPRAY | Freq: Once | RESPIRATORY_TRACT | Status: DC | PRN

## 2020-11-07 MED ORDER — METHYLPREDNISOLONE SODIUM SUCC 125 MG IJ SOLR: 125.0000 mg | Freq: Once | INTRAMUSCULAR | Status: DC | PRN

## 2020-11-07 MED ORDER — FAMOTIDINE IN NACL 20-0.9 MG/50ML-% IV SOLN: 20.0000 mg | Freq: Once | INTRAVENOUS | Status: DC | PRN

## 2020-11-07 MED ORDER — EPINEPHRINE 0.3 MG/0.3ML IJ SOAJ: 0.3000 mg | Freq: Once | INTRAMUSCULAR | Status: DC | PRN

## 2020-11-07 MED ORDER — OMALIZUMAB 150 MG/ML ~~LOC~~ SOSY
150.0000 mg | PREFILLED_SYRINGE | Freq: Once | SUBCUTANEOUS | Status: AC
  Administered 2020-11-07: 150 mg via SUBCUTANEOUS
  Filled 2020-11-07: qty 1

## 2020-11-07 MED ORDER — SODIUM CHLORIDE 0.9 % IV SOLN: Freq: Once | INTRAVENOUS | Status: DC | PRN

## 2020-11-07 NOTE — Progress Notes (Signed)
Diagnosis: Asthma  Provider:  Marshell Garfinkel, MD  Procedure: Injection  Xolair (Omalizumab), Dose: 150 mg, Site: subcutaneous  Discharge: Condition: Good, Destination: Home . AVS provided to patient.   Performed by:  Damarius Karnes, Sherlon Handing, LPN

## 2020-11-13 ENCOUNTER — Other Ambulatory Visit: Payer: Self-pay

## 2020-11-13 DIAGNOSIS — R1084 Generalized abdominal pain: Secondary | ICD-10-CM

## 2020-11-13 MED ORDER — DICYCLOMINE HCL 20 MG PO TABS: ORAL_TABLET | ORAL | 0 refills | Status: DC

## 2020-11-14 ENCOUNTER — Other Ambulatory Visit: Payer: Self-pay

## 2020-11-14 DIAGNOSIS — R1084 Generalized abdominal pain: Secondary | ICD-10-CM

## 2020-11-14 MED ORDER — DICYCLOMINE HCL 20 MG PO TABS: ORAL_TABLET | ORAL | 0 refills | Status: DC

## 2020-11-20 ENCOUNTER — Ambulatory Visit: Payer: Medicare Other

## 2020-11-20 ENCOUNTER — Encounter: Payer: Self-pay | Admitting: Internal Medicine

## 2020-11-22 ENCOUNTER — Other Ambulatory Visit: Payer: Self-pay

## 2020-11-22 ENCOUNTER — Ambulatory Visit (INDEPENDENT_AMBULATORY_CARE_PROVIDER_SITE_OTHER): Payer: Medicare Other

## 2020-11-22 VITALS — Temp 97.9°F

## 2020-11-22 DIAGNOSIS — Z23 Encounter for immunization: Secondary | ICD-10-CM | POA: Diagnosis not present

## 2020-12-11 ENCOUNTER — Ambulatory Visit (INDEPENDENT_AMBULATORY_CARE_PROVIDER_SITE_OTHER): Payer: Medicare Other

## 2020-12-11 ENCOUNTER — Other Ambulatory Visit: Payer: Self-pay

## 2020-12-11 VITALS — BP 119/59 | HR 57 | Temp 97.5°F | Resp 16 | Ht 60.0 in | Wt 121.4 lb

## 2020-12-11 DIAGNOSIS — J4540 Moderate persistent asthma, uncomplicated: Secondary | ICD-10-CM | POA: Diagnosis not present

## 2020-12-11 MED ORDER — OMALIZUMAB 150 MG/ML ~~LOC~~ SOSY
150.0000 mg | PREFILLED_SYRINGE | Freq: Once | SUBCUTANEOUS | Status: AC
  Administered 2020-12-11: 150 mg via SUBCUTANEOUS
  Filled 2020-12-11: qty 1

## 2020-12-11 MED ORDER — EPINEPHRINE 0.3 MG/0.3ML IJ SOAJ: 0.3000 mg | Freq: Once | INTRAMUSCULAR | Status: DC | PRN

## 2020-12-11 MED ORDER — METHYLPREDNISOLONE SODIUM SUCC 125 MG IJ SOLR: 125.0000 mg | Freq: Once | INTRAMUSCULAR | Status: DC | PRN

## 2020-12-11 MED ORDER — ALBUTEROL SULFATE HFA 108 (90 BASE) MCG/ACT IN AERS: 2.0000 | INHALATION_SPRAY | Freq: Once | RESPIRATORY_TRACT | Status: DC | PRN

## 2020-12-11 MED ORDER — DIPHENHYDRAMINE HCL 50 MG/ML IJ SOLN: 50.0000 mg | Freq: Once | INTRAMUSCULAR | Status: DC | PRN

## 2020-12-11 MED ORDER — FAMOTIDINE IN NACL 20-0.9 MG/50ML-% IV SOLN: 20.0000 mg | Freq: Once | INTRAVENOUS | Status: DC | PRN

## 2020-12-11 MED ORDER — SODIUM CHLORIDE 0.9 % IV SOLN: Freq: Once | INTRAVENOUS | Status: DC | PRN

## 2020-12-11 NOTE — Progress Notes (Signed)
Diagnosis: Asthma  Provider:  Marshell Garfinkel, MD  Procedure: Injection  Xolair (Omalizumab), Dose: 150 mg, Site: subcutaneous  Discharge: Condition: Good, Destination: Home . AVS provided to patient.   Performed by:  Arnoldo Morale, RN

## 2020-12-25 ENCOUNTER — Ambulatory Visit: Payer: Medicare Other

## 2021-01-15 ENCOUNTER — Ambulatory Visit (INDEPENDENT_AMBULATORY_CARE_PROVIDER_SITE_OTHER): Payer: Medicare Other

## 2021-01-15 ENCOUNTER — Other Ambulatory Visit: Payer: Self-pay

## 2021-01-15 ENCOUNTER — Encounter: Payer: Self-pay | Admitting: Internal Medicine

## 2021-01-15 VITALS — BP 122/58 | HR 67 | Temp 98.2°F | Resp 18 | Ht 60.0 in | Wt 122.6 lb

## 2021-01-15 DIAGNOSIS — J4540 Moderate persistent asthma, uncomplicated: Secondary | ICD-10-CM | POA: Diagnosis not present

## 2021-01-15 MED ORDER — DIPHENHYDRAMINE HCL 50 MG/ML IJ SOLN: 50.0000 mg | Freq: Once | INTRAMUSCULAR | Status: DC | PRN

## 2021-01-15 MED ORDER — EPINEPHRINE 0.3 MG/0.3ML IJ SOAJ: 0.3000 mg | Freq: Once | INTRAMUSCULAR | Status: DC | PRN

## 2021-01-15 MED ORDER — SODIUM CHLORIDE 0.9 % IV SOLN: Freq: Once | INTRAVENOUS | Status: DC | PRN

## 2021-01-15 MED ORDER — FAMOTIDINE IN NACL 20-0.9 MG/50ML-% IV SOLN: 20.0000 mg | Freq: Once | INTRAVENOUS | Status: DC | PRN

## 2021-01-15 MED ORDER — ALBUTEROL SULFATE HFA 108 (90 BASE) MCG/ACT IN AERS: 2.0000 | INHALATION_SPRAY | Freq: Once | RESPIRATORY_TRACT | Status: DC | PRN

## 2021-01-15 MED ORDER — METHYLPREDNISOLONE SODIUM SUCC 125 MG IJ SOLR: 125.0000 mg | Freq: Once | INTRAMUSCULAR | Status: DC | PRN

## 2021-01-15 MED ORDER — OMALIZUMAB 150 MG/ML ~~LOC~~ SOSY
150.0000 mg | PREFILLED_SYRINGE | Freq: Once | SUBCUTANEOUS | Status: AC
  Administered 2021-01-15: 150 mg via SUBCUTANEOUS
  Filled 2021-01-15: qty 1

## 2021-01-15 NOTE — Progress Notes (Signed)
Diagnosis: Asthma  Provider:  Marshell Garfinkel, MD  Procedure: Injection  Xolair (Omalizumab), Dose: 150 mg, Site: subcutaneous, Number of injections: 1  Discharge: Condition: Good, Destination: Home . AVS provided to patient.   Performed by:  Korrie Hofbauer, Sherlon Handing, LPN

## 2021-01-22 ENCOUNTER — Ambulatory Visit: Payer: Medicare Other

## 2021-01-24 ENCOUNTER — Encounter: Payer: Self-pay | Admitting: Internal Medicine

## 2021-02-19 ENCOUNTER — Ambulatory Visit: Payer: Medicare Other

## 2021-02-19 ENCOUNTER — Ambulatory Visit (INDEPENDENT_AMBULATORY_CARE_PROVIDER_SITE_OTHER): Payer: Medicare Other

## 2021-02-19 ENCOUNTER — Other Ambulatory Visit: Payer: Self-pay

## 2021-02-19 VITALS — BP 145/76 | HR 69 | Temp 98.2°F | Resp 20 | Ht 60.0 in | Wt 122.8 lb

## 2021-02-19 DIAGNOSIS — J4540 Moderate persistent asthma, uncomplicated: Secondary | ICD-10-CM

## 2021-02-19 MED ORDER — OMALIZUMAB 150 MG/ML ~~LOC~~ SOSY
150.0000 mg | PREFILLED_SYRINGE | Freq: Once | SUBCUTANEOUS | Status: AC
  Administered 2021-02-19: 150 mg via SUBCUTANEOUS

## 2021-02-19 MED ORDER — DIPHENHYDRAMINE HCL 50 MG/ML IJ SOLN: 50.0000 mg | Freq: Once | INTRAMUSCULAR | Status: DC | PRN

## 2021-02-19 MED ORDER — SODIUM CHLORIDE 0.9 % IV SOLN: Freq: Once | INTRAVENOUS | Status: DC | PRN

## 2021-02-19 MED ORDER — FAMOTIDINE IN NACL 20-0.9 MG/50ML-% IV SOLN: 20.0000 mg | Freq: Once | INTRAVENOUS | Status: DC | PRN

## 2021-02-19 MED ORDER — METHYLPREDNISOLONE SODIUM SUCC 125 MG IJ SOLR: 125.0000 mg | Freq: Once | INTRAMUSCULAR | Status: DC | PRN

## 2021-02-19 MED ORDER — EPINEPHRINE 0.3 MG/0.3ML IJ SOAJ: 0.3000 mg | Freq: Once | INTRAMUSCULAR | Status: DC | PRN

## 2021-02-19 MED ORDER — ALBUTEROL SULFATE HFA 108 (90 BASE) MCG/ACT IN AERS: 2.0000 | INHALATION_SPRAY | Freq: Once | RESPIRATORY_TRACT | Status: DC | PRN

## 2021-02-19 NOTE — Progress Notes (Signed)
Diagnosis: Asthma  Provider:  Marshell Garfinkel, MD  Procedure: Injection  Xolair (Omalizumab), Dose: 150 mg, Site: subcutaneous, Number of injections: 1  Discharge: Condition: Good, Destination: Home . AVS provided to patient.   Performed by:  Vi Whitesel, Sherlon Handing, LPN

## 2021-03-04 ENCOUNTER — Other Ambulatory Visit: Payer: Self-pay | Admitting: Pharmacy Technician

## 2021-03-26 ENCOUNTER — Ambulatory Visit: Payer: Medicare Other

## 2021-04-05 ENCOUNTER — Other Ambulatory Visit: Payer: Self-pay

## 2021-04-05 ENCOUNTER — Ambulatory Visit (INDEPENDENT_AMBULATORY_CARE_PROVIDER_SITE_OTHER): Payer: Medicare Other

## 2021-04-05 VITALS — BP 149/73 | HR 59 | Temp 98.5°F | Resp 16 | Wt 121.2 lb

## 2021-04-05 DIAGNOSIS — J4540 Moderate persistent asthma, uncomplicated: Secondary | ICD-10-CM | POA: Diagnosis not present

## 2021-04-05 MED ORDER — OMALIZUMAB 150 MG/ML ~~LOC~~ SOSY
150.0000 mg | PREFILLED_SYRINGE | Freq: Once | SUBCUTANEOUS | Status: AC
  Administered 2021-04-05: 150 mg via SUBCUTANEOUS

## 2021-04-05 NOTE — Progress Notes (Signed)
Diagnosis: Asthma ? ?Provider:  Marshell Garfinkel, MD ? ?Procedure: Injection ? ?Xolair (Omalizumab), Dose: 150 mg, Site: subcutaneous, Number of injections: 1 ? ?Discharge: Condition: Good, Destination: Home . AVS provided to patient.  ? ?Performed by:  Paul Dykes, RN  ? ? ? ?  ?

## 2021-04-07 ENCOUNTER — Other Ambulatory Visit: Payer: Self-pay | Admitting: Internal Medicine

## 2021-04-07 DIAGNOSIS — E039 Hypothyroidism, unspecified: Secondary | ICD-10-CM

## 2021-04-25 DIAGNOSIS — Z1231 Encounter for screening mammogram for malignant neoplasm of breast: Secondary | ICD-10-CM | POA: Diagnosis not present

## 2021-04-25 DIAGNOSIS — Z01419 Encounter for gynecological examination (general) (routine) without abnormal findings: Secondary | ICD-10-CM | POA: Diagnosis not present

## 2021-04-25 DIAGNOSIS — Z6825 Body mass index (BMI) 25.0-25.9, adult: Secondary | ICD-10-CM | POA: Diagnosis not present

## 2021-04-27 ENCOUNTER — Ambulatory Visit: Payer: Medicare Other

## 2021-04-30 ENCOUNTER — Other Ambulatory Visit: Payer: Self-pay | Admitting: Obstetrics and Gynecology

## 2021-04-30 DIAGNOSIS — R928 Other abnormal and inconclusive findings on diagnostic imaging of breast: Secondary | ICD-10-CM

## 2021-05-01 ENCOUNTER — Encounter: Payer: Self-pay | Admitting: Internal Medicine

## 2021-05-01 ENCOUNTER — Encounter: Payer: Self-pay | Admitting: Nurse Practitioner

## 2021-05-01 ENCOUNTER — Ambulatory Visit (INDEPENDENT_AMBULATORY_CARE_PROVIDER_SITE_OTHER): Payer: Medicare Other | Admitting: Nurse Practitioner

## 2021-05-01 ENCOUNTER — Other Ambulatory Visit: Payer: Self-pay

## 2021-05-01 VITALS — BP 108/70 | HR 60 | Temp 97.8°F | Resp 16 | Ht 60.0 in | Wt 124.4 lb

## 2021-05-01 DIAGNOSIS — E039 Hypothyroidism, unspecified: Secondary | ICD-10-CM | POA: Diagnosis not present

## 2021-05-01 DIAGNOSIS — R5383 Other fatigue: Secondary | ICD-10-CM

## 2021-05-01 DIAGNOSIS — Z5181 Encounter for therapeutic drug level monitoring: Secondary | ICD-10-CM

## 2021-05-01 DIAGNOSIS — R61 Generalized hyperhidrosis: Secondary | ICD-10-CM | POA: Diagnosis not present

## 2021-05-01 DIAGNOSIS — F3341 Major depressive disorder, recurrent, in partial remission: Secondary | ICD-10-CM

## 2021-05-01 MED ORDER — SERTRALINE HCL 50 MG PO TABS: ORAL_TABLET | ORAL | 3 refills | Status: DC

## 2021-05-01 NOTE — Progress Notes (Signed)
Assessment and Plan: ? ?Ms. Elzey was seen today for a follow up. ? ?Diagnoses and all order for this visit: ? ?1. Hypothyroidism, unspecified type/Night seats/Other fatigue ? ?- TSH ? ?2. Encounter for therapeutic drug level monitoring ? ?Possible adjustment to Levothyroxine dose if levels return <0.5.  ?Continue to take as directed for now. ? ?Will reach out via MyChart for further instructions based on result of TSH.   ? ? ?Further disposition pending results of labs. Discussed med's effects and SE's.   ? ?Future Appointments  ?Date Time Provider [place]  ?05/11/2021  3:00 PM [facility] 2 [facility] None  ?06/13/2021  3:00 PM [name], MD [facility] None  ?08/10/2021  1:30 PM [name] Christena Deem, MD [facility] None  ?09/17/2021  2:30 PM Mull, Townsend Roger, NP [facility] None  ? ? ?------------------------------------------------------------------------------------------------------------------ ? ? ?HPI ?BP 108/70   Pulse 60   Temp 97.8 ?F (36.6 ?C)   Resp 16   Ht 5' (1.524 m)   Wt 124 lb 6.4 oz (56.4 kg)   SpO2 94%   BMI 24.30 kg/m?  ? ?88 y.o.female presents to clinic to discuss night sweats that have occurred occasionally over the last month but have intensified over the last week.  She has had to change her bed sheets x3 in the last two weeks.  She has a hx of Hypothyroidism.  Takes levothyroxine 75 mcg daily as directed.  She reports taking this dose for "years."  Has not skipped a dose or taken more than usual.  Endorses gradual weight loss, lethargy.  Denies heart palpitations.  Last TSH level checked 09/2020 and WNL at 2.08.   ? ?She is asking for a refill on Zoloft.  I confirmed with her that she has not missed a dose of this mediation.  She confirms that she will occassionally miss a dose of Zoloft but this is not persistent.  She does not feel as though she is having SE/withdrawal from [place].    ? ?Past Medical History:  ?Diagnosis Date  ? Asthma   ? COPD (chronic obstructive pulmonary disease)  ([place])   ? Depression   ? Dizziness   ? Hyperlipemia   ? Hypertension   ? Hypothyroidism   ? Prediabetes   ? SVD (spontaneous vaginal delivery)   ? x 2  ? Vitamin D deficiency   ?  ? ?Allergies  ?Allergen Reactions  ? Codeine Nausea Only  ? Propoxyphene N-Acetaminophen Nausea Only  ? Shellfish-Derived Products Rash  ? ? ?Current Outpatient Medications on File Prior to Visit  ?Medication Sig  ? Ascorbic Acid (VITAMIN C PO) Take 500 mg by mouth daily.  ? aspirin EC 81 MG tablet Take 81 mg by mouth daily.  ? bisoprolol-hydrochlorothiazide (ZIAC) 5-6.25 MG tablet Take  1 tablet  Daily  for BP  ? budesonide (PULMICORT) 0.25 MG/2ML nebulizer solution USE 1 VIAL  IN  NEBULIZER TWICE  DAILY - rinse mouth after treatment  ? Cholecalciferol (VITAMIN D PO) Take 5,000 Units by mouth daily.  ? dicyclomine (BENTYL) 20 MG tablet Take 1/2 to 1 tablet 4 x /day before Meals & Bedtime for Abdominal Cramping , Bloating or Nausea  ? EPINEPHrine (EPIPEN JR) 0.15 MG/0.3ML injection Inject 0.15 mg into the muscle as needed.  ? formoterol (PERFOROMIST) 20 MCG/2ML nebulizer solution USE 1 VIAL  IN  NEBULIZER TWICE  DAILY - morning and evening  ? Ipratropium-Albuterol (COMBIVENT RESPIMAT) 20-100 MCG/ACT AERS respimat USE 1 INHALATION EVERY 6 HOURS AS NEEDED FOR WHEEZING  ?  levothyroxine (SYNTHROID) 75 MCG tablet TAKE AS INSTRUCTED BY YOUR PRESCRIBER  ? loratadine (CLARITIN) 10 MG tablet Take 1 tablet (10 mg total) by mouth daily as needed for allergies.  ? meclizine (ANTIVERT) 25 MG tablet Take 1/2 to 1 tablet 3 x /day as needed for dizziness / Vertigo  ? sertraline (ZOLOFT) 50 MG tablet Take  1 tablet  Daily  for mood.  ? zinc gluconate 50 MG tablet Take 50 mg by mouth daily.  ? ?Current Facility-Administered Medications on File Prior to Visit  ?Medication  ? omalizumab Arvid Right) injection 150 mg  ? omalizumab Arvid Right) injection 150 mg  ? ? ?ROS: all negative except what is noted in the HPI.  ? ?Physical Exam: ? ?BP 108/70   Pulse 60   Temp  97.8 ?F (36.6 ?C)   Resp 16   Ht 5' (1.524 m)   Wt 124 lb 6.4 oz (56.4 kg)   SpO2 94%   BMI 24.30 kg/m?  ? ?General Appearance: NAD.  Awake, conversant and cooperative. ?Eyes: PERRLA, EOMs intact.  Sclera white.  Conjunctiva without erythema. ?Sinuses: No frontal/maxillary tenderness.  No nasal discharge. Nares patent.  ?ENT/Mouth: Ext aud canals clear.  Bilateral TMs w/DOL and without erythema or bulging. Hearing intact.  Posterior pharynx without swelling or exudate.  Tonsils without swelling or erythema.  ?Neck: Supple.  No masses, nodules or thyromegaly. ?Respiratory: Effort is regular with non-labored breathing. Breath sounds are equal bilaterally without rales, rhonchi, wheezing or stridor.  ?Cardio: RRR with no MRGs. Brisk peripheral pulses without edema.  ?Abdomen: Active BS in all four quadrants.  Soft and non-tender without guarding, rebound tenderness, hernias or masses. ?Lymphatics: Non tender without lymphadenopathy.  ?Musculoskeletal: Full ROM, 5/5 strength, normal ambulation.  No clubbing or cyanosis. ?Skin: Appropriate color for ethnicity. Warm without rashes, lesions, ecchymosis, ulcers.  ?Neuro: CN II-XII grossly normal. Normal muscle tone without cerebellar symptoms and intact sensation.   ?Psych: AO X 3,  appropriate mood and affect, insight and judgment.  ?  ? ?[name], NP ?2:47 PM ?[facility] ? ?

## 2021-05-02 ENCOUNTER — Other Ambulatory Visit: Payer: Self-pay | Admitting: Nurse Practitioner

## 2021-05-02 ENCOUNTER — Encounter: Payer: Self-pay | Admitting: Nurse Practitioner

## 2021-05-02 DIAGNOSIS — R232 Flushing: Secondary | ICD-10-CM

## 2021-05-02 LAB — TSH: TSH: 3.21 mIU/L (ref 0.40–4.50)

## 2021-05-03 ENCOUNTER — Telehealth: Payer: Self-pay | Admitting: Nurse Practitioner

## 2021-05-03 ENCOUNTER — Other Ambulatory Visit: Payer: Medicare Other

## 2021-05-03 ENCOUNTER — Other Ambulatory Visit: Payer: Self-pay

## 2021-05-03 ENCOUNTER — Ambulatory Visit: Payer: Medicare Other

## 2021-05-03 ENCOUNTER — Other Ambulatory Visit: Payer: Self-pay | Admitting: Nurse Practitioner

## 2021-05-03 DIAGNOSIS — R232 Flushing: Secondary | ICD-10-CM

## 2021-05-03 DIAGNOSIS — F3341 Major depressive disorder, recurrent, in partial remission: Secondary | ICD-10-CM

## 2021-05-03 MED ORDER — SERTRALINE HCL 50 MG PO TABS: ORAL_TABLET | ORAL | 3 refills | Status: DC

## 2021-05-03 NOTE — Telephone Encounter (Signed)
Please send Zoloft to [facility]. ? ?Thank you! ?

## 2021-05-04 LAB — CBC WITH DIFFERENTIAL/PLATELET
Absolute Monocytes: 774 cells/uL (ref 200–950)
Basophils Absolute: 63 cells/uL (ref 0–200)
Basophils Relative: 0.8 %
Eosinophils Absolute: 292 cells/uL (ref 15–500)
Eosinophils Relative: 3.7 %
HCT: 38.3 % (ref 35.0–45.0)
Hemoglobin: 12.5 g/dL (ref 11.7–15.5)
Lymphs Abs: 1067 cells/uL (ref 850–3900)
MCH: 29.7 pg (ref 27.0–33.0)
MCHC: 32.6 g/dL (ref 32.0–36.0)
MCV: 91 fL (ref 80.0–100.0)
MPV: 11.1 fL (ref 7.5–12.5)
Monocytes Relative: 9.8 %
Neutro Abs: 5704 cells/uL (ref 1500–7800)
Neutrophils Relative %: 72.2 %
Platelets: 274 10*3/uL (ref 140–400)
RBC: 4.21 10*6/uL (ref 3.80–5.10)
RDW: 13.1 % (ref 11.0–15.0)
Total Lymphocyte: 13.5 %
WBC: 7.9 10*3/uL (ref 3.8–10.8)

## 2021-05-04 LAB — COMPLETE METABOLIC PANEL WITH GFR
AG Ratio: 1.5 (calc) (ref 1.0–2.5)
ALT: 9 U/L (ref 6–29)
AST: 15 U/L (ref 10–35)
Albumin: 4 g/dL (ref 3.6–5.1)
Alkaline phosphatase (APISO): 90 U/L (ref 37–153)
BUN: 16 mg/dL (ref 7–25)
CO2: 28 mmol/L (ref 20–32)
Calcium: 9.6 mg/dL (ref 8.6–10.4)
Chloride: 99 mmol/L (ref 98–110)
Creat: 0.83 mg/dL (ref 0.60–0.95)
Globulin: 2.7 g/dL (calc) (ref 1.9–3.7)
Glucose, Bld: 120 mg/dL — ABNORMAL HIGH (ref 65–99)
Potassium: 4.4 mmol/L (ref 3.5–5.3)
Sodium: 137 mmol/L (ref 135–146)
Total Bilirubin: 0.4 mg/dL (ref 0.2–1.2)
Total Protein: 6.7 g/dL (ref 6.1–8.1)
eGFR: 69 mL/min/{1.73_m2} (ref >=60)

## 2021-05-04 LAB — URINALYSIS, ROUTINE W REFLEX MICROSCOPIC
Bilirubin Urine: NEGATIVE
Glucose, UA: NEGATIVE
Hgb urine dipstick: NEGATIVE
Ketones, ur: NEGATIVE
Leukocytes,Ua: NEGATIVE
Nitrite: NEGATIVE
Protein, ur: NEGATIVE
Specific Gravity, Urine: 1.012 (ref 1.001–1.035)
pH: 6 (ref 5.0–8.0)

## 2021-05-04 LAB — C-REACTIVE PROTEIN: CRP: 6.6 mg/L (ref <8.0)

## 2021-05-07 ENCOUNTER — Ambulatory Visit
Admission: RE | Admit: 2021-05-07 | Discharge: 2021-05-07 | Disposition: A | Discharge status: Home / Self Care | Payer: Medicare Other | Source: Ambulatory Visit | Attending: Nurse Practitioner | Admitting: Nurse Practitioner

## 2021-05-07 DIAGNOSIS — R232 Flushing: Secondary | ICD-10-CM

## 2021-05-07 DIAGNOSIS — M419 Scoliosis, unspecified: Secondary | ICD-10-CM | POA: Diagnosis not present

## 2021-05-07 DIAGNOSIS — R918 Other nonspecific abnormal finding of lung field: Secondary | ICD-10-CM | POA: Diagnosis not present

## 2021-05-09 ENCOUNTER — Other Ambulatory Visit: Payer: Self-pay | Admitting: Nurse Practitioner

## 2021-05-09 DIAGNOSIS — J189 Pneumonia, unspecified organism: Secondary | ICD-10-CM

## 2021-05-09 MED ORDER — DOXYCYCLINE HYCLATE 100 MG PO CAPS: ORAL_CAPSULE | ORAL | 0 refills | Status: DC

## 2021-05-11 ENCOUNTER — Ambulatory Visit: Payer: Medicare Other

## 2021-05-14 ENCOUNTER — Ambulatory Visit (INDEPENDENT_AMBULATORY_CARE_PROVIDER_SITE_OTHER): Payer: Medicare Other | Admitting: *Deleted

## 2021-05-14 VITALS — BP 130/72 | HR 63 | Temp 97.5°F | Resp 18 | Ht 59.0 in | Wt 121.6 lb

## 2021-05-14 DIAGNOSIS — J4540 Moderate persistent asthma, uncomplicated: Secondary | ICD-10-CM | POA: Diagnosis not present

## 2021-05-14 DIAGNOSIS — R928 Other abnormal and inconclusive findings on diagnostic imaging of breast: Secondary | ICD-10-CM

## 2021-05-14 MED ORDER — OMALIZUMAB 150 MG/ML ~~LOC~~ SOSY
150.0000 mg | PREFILLED_SYRINGE | Freq: Once | SUBCUTANEOUS | Status: AC
  Administered 2021-05-14: 150 mg via SUBCUTANEOUS
  Filled 2021-05-14: qty 1

## 2021-05-14 NOTE — Progress Notes (Signed)
[place] ? ? ? ?Future Appointments  ?Date Time Provider Department  ?05/15/2021 10:30 AM [name], MD [facility]  ?06/13/2021  3:00 PM [name], MD [facility]  ?08/10/2021  1:30 PM [name], MD [facility]  ?09/17/2021  2:30 PM Magda Bernheim, NP [facility]  ? ? ?History of Present Illness: ? ?         This very nice 88 y.o. [place] with  HTN, HLD, Prediabetes,  Hypothyroidism, Allergic Asthma and Vitamin D Deficiency who presents with concerns of  "Stomach issues , dizziness & night sweats ".  Patient reports sx's of EG discomfort & associated nausea. She reports sensations of feeling "off balance " but no definite spinning type vertigo or falling to either side. Over the last several weeks she had several episodes of night sweats. She denies and upper /lower respiratory sx's as cough, congestion or sputum. Likewise, she denies any UT sx's as dysuria.                                                                                                                                                                                                                            ? ?Medications ? ?  levothyroxine 75 MCG tablet, TAKE AS INSTRUCTED BY YOUR PRESCRIBER ? ?  bisoprolol-hctz 5-6.25 MG tablet, Take  1 tablet  Daily  for BP ?  EPIPEN JR 0.15 MG/0.3ML inj, Inject 0.15 mg into the muscle as needed. ?  budesonide 0.25 MG/2ML neb soln, USE 1 VIAL  IN  NEB TWICE  DAILY  ?  formoterol 20 MCG/2ML neb soln, USE 1 VIAL  IN  NEB TWICE  DAILY  ?  Ipratropium-Albuterol RESPIMAT USE 1 INHALATION EVERY 6 HRS AS NEEDED  ?  loratadine  10 MG tablet, Take 1 tablet  daily as needed  ?  omalizumab Arvid Right) injection 150 mg ?  omalizumab Arvid Right) injection 150 mg ?  aspirin EC 81 MG tablet, Take daily. ?  VITAMIN C 500 mg , Take daily. ?  VITAMIN D 5,000 Units, Take   daily. ?  dicyclomine 20 MG tablet, Take 1/2 to 1 tablet 4 x /day before Meals & Bedtime  ?  meclizine  25 MG tablet, Take 1/2 to 1 tablet 3 x /day as needed  ?   sertraline 50 MG tablet, Take  1 tablet  Daily  ?  zinc 50 MG tablet, Take  daily. ? ? ?Problem list ?She has Extrinsic asthma; GERD; Abnormal  glucose; Vitamin D deficiency; Hypothyroidism; Hyperlipidemia, mixed; Essential hypertension; Medication management; Vertigo; Hearing loss; Depression, major, recurrent, in partial remission ; Osteoporosis; Extrinsic asthma, moderate persistent, uncomplicated; and Labile hypertension on their problem list. ?  ?Observations/Objective: ? ?BP 110/70 Comment: 120/78 standing, 128/70 lying - dizzy when rising  Pulse 61   Temp 97.9 ?F (36.6 ?C)   Resp 16   Ht 5' (1.524 m)   Wt 122 lb 3.2 oz (55.4 kg)   SpO2 94%   BMI 23.87 kg/m?  ? ?HEENT -  EACs patent  & TM's sl dull & ? Retracted.  ?                   EOMs Nl /full PERRLA.   N/O / C / ?Neck - supple. Car 2+/2+ w/o Br, N, JVD. ?Chest - Clear equal BS. No M ?Cor - Nl HS. RRR w/o sig MGR. PP 1(+). No edema. ?MS- FROM w/o deformities.  Gait Nl. ?Neuro -  Nl w/o focal abnormalities. ? ?Assessment and Plan: ? ?1. Nausea ? ?- CBC with Differential/Platelet ?- COMPLETE METABOLIC PANEL WITH GFR ?- Sedimentation rate ? ?2. Dizziness ? ?- CBC with Differential/Platelet ?- COMPLETE METABOLIC PANEL WITH GFR ?- Sedimentation rate ? ?3. Fatigue, unspecified type ? ?- CBC with Differential/Platelet ?- COMPLETE METABOLIC PANEL WITH GFR ?- Sedimentation rate ? ?4. Abdominal discomfort ? ?- CBC with Differential/Platelet ?- COMPLETE METABOLIC PANEL WITH GFR ?- Sedimentation rate ? ?5. Generalized abdominal pain ? ?- dicyclomine (BENTYL) 20 MG tablet; Take 1/2 to 1 tablet 4 x /day before Meals & Bedtime for Abdominal Cramping , Bloating or Nausea  Dispense: 360 tablet; Refill: 0 ? ? ?Follow Up Instructions: ? ?    I discussed the assessment and treatment plan with the patient. The patient was provided an opportunity to ask questions and all were answered. The patient agreed with the plan and demonstrated an understanding of the  instructions. ?  ?    The patient was advised to call back or seek an in-person evaluation if the symptoms worsen or if the condition fails to improve as anticipated. ? ? ?Kirtland Bouchard, MD ? ?

## 2021-05-14 NOTE — Progress Notes (Signed)
Diagnosis: Asthma  Provider:  Praveen Mannam, MD  Procedure: Injection  Xolair (Omalizumab), Dose: 150 mg, Site: subcutaneous, Number of injections: 1  Discharge: Condition: Good, Destination: Home . AVS provided to patient.   Performed by:  Linzy Laury A, RN       

## 2021-05-15 ENCOUNTER — Ambulatory Visit (INDEPENDENT_AMBULATORY_CARE_PROVIDER_SITE_OTHER): Payer: Medicare Other | Admitting: Internal Medicine

## 2021-05-15 ENCOUNTER — Encounter: Payer: Self-pay | Admitting: Internal Medicine

## 2021-05-15 VITALS — BP 110/70 | HR 61 | Temp 97.9°F | Resp 16 | Ht 60.0 in | Wt 122.2 lb

## 2021-05-15 DIAGNOSIS — R1084 Generalized abdominal pain: Secondary | ICD-10-CM

## 2021-05-15 DIAGNOSIS — R5383 Other fatigue: Secondary | ICD-10-CM

## 2021-05-15 DIAGNOSIS — R42 Dizziness and giddiness: Secondary | ICD-10-CM | POA: Diagnosis not present

## 2021-05-15 DIAGNOSIS — R109 Unspecified abdominal pain: Secondary | ICD-10-CM | POA: Diagnosis not present

## 2021-05-15 DIAGNOSIS — R110 Nausea: Secondary | ICD-10-CM

## 2021-05-15 MED ORDER — OMEPRAZOLE 40 MG PO CPDR: DELAYED_RELEASE_CAPSULE | ORAL | 3 refills | Status: DC

## 2021-05-15 MED ORDER — ESOMEPRAZOLE MAGNESIUM 40 MG PO CPDR: DELAYED_RELEASE_CAPSULE | ORAL | 1 refills | Status: DC

## 2021-05-15 MED ORDER — DICYCLOMINE HCL 20 MG PO TABS: ORAL_TABLET | ORAL | 0 refills | Status: DC

## 2021-05-16 LAB — CBC WITH DIFFERENTIAL/PLATELET
Absolute Monocytes: 610 cells/uL (ref 200–950)
Basophils Absolute: 79 cells/uL (ref 0–200)
Basophils Relative: 1.3 %
Eosinophils Absolute: 183 cells/uL (ref 15–500)
Eosinophils Relative: 3 %
HCT: 41.5 % (ref 35.0–45.0)
Hemoglobin: 13.6 g/dL (ref 11.7–15.5)
Lymphs Abs: 1110 cells/uL (ref 850–3900)
MCH: 29.6 pg (ref 27.0–33.0)
MCHC: 32.8 g/dL (ref 32.0–36.0)
MCV: 90.4 fL (ref 80.0–100.0)
MPV: 11 fL (ref 7.5–12.5)
Monocytes Relative: 10 %
Neutro Abs: 4118 cells/uL (ref 1500–7800)
Neutrophils Relative %: 67.5 %
Platelets: 287 10*3/uL (ref 140–400)
RBC: 4.59 10*6/uL (ref 3.80–5.10)
RDW: 13 % (ref 11.0–15.0)
Total Lymphocyte: 18.2 %
WBC: 6.1 10*3/uL (ref 3.8–10.8)

## 2021-05-16 LAB — COMPLETE METABOLIC PANEL WITH GFR
AG Ratio: 1.4 (calc) (ref 1.0–2.5)
ALT: 11 U/L (ref 6–29)
AST: 19 U/L (ref 10–35)
Albumin: 4.2 g/dL (ref 3.6–5.1)
Alkaline phosphatase (APISO): 87 U/L (ref 37–153)
BUN: 23 mg/dL (ref 7–25)
CO2: 25 mmol/L (ref 20–32)
Calcium: 10.3 mg/dL (ref 8.6–10.4)
Chloride: 100 mmol/L (ref 98–110)
Creat: 0.71 mg/dL (ref 0.60–0.95)
Globulin: 2.9 g/dL (calc) (ref 1.9–3.7)
Glucose, Bld: 89 mg/dL (ref 65–99)
Potassium: 4.9 mmol/L (ref 3.5–5.3)
Sodium: 139 mmol/L (ref 135–146)
Total Bilirubin: 0.6 mg/dL (ref 0.2–1.2)
Total Protein: 7.1 g/dL (ref 6.1–8.1)
eGFR: 83 mL/min/{1.73_m2} (ref >=60)

## 2021-05-16 LAB — SEDIMENTATION RATE: Sed Rate: 17 mm/h (ref 0–30)

## 2021-05-16 NOTE — Progress Notes (Signed)
<><><><><><><><><><><><><><><><><><><><><><><><><><><><><><><><><> ?<><><><><><><><><><><><><><><><><><><><><><><><><><><><><><><><><> ? ?-    Labs showed  CBC - Kidneys - Electrolytes - Liver   & Sed rate  -  all  Normal / OK ?<><><><><><><><><><><><><><><><><><><><><><><><><><><><><><><><><> ?<><><><><><><><><><><><><><><><><><><><><><><><><><><><><><><><><> ? ? ?

## 2021-05-18 ENCOUNTER — Other Ambulatory Visit: Payer: Self-pay | Admitting: Obstetrics and Gynecology

## 2021-05-18 DIAGNOSIS — R928 Other abnormal and inconclusive findings on diagnostic imaging of breast: Secondary | ICD-10-CM

## 2021-05-28 ENCOUNTER — Telehealth: Payer: Self-pay | Admitting: Nurse Practitioner

## 2021-05-28 NOTE — Telephone Encounter (Signed)
Pt says she is still feeling nauseous and dizzy and that her night sweats have started again. Wanting to speak to [facility] about what can be done because she has 2 trips coming up and doesn't want this to affect it.  ?

## 2021-05-30 ENCOUNTER — Other Ambulatory Visit: Payer: Self-pay | Admitting: Obstetrics and Gynecology

## 2021-05-30 ENCOUNTER — Ambulatory Visit
Admission: RE | Admit: 2021-05-30 | Discharge: 2021-05-30 | Disposition: A | Discharge status: Home / Self Care | Payer: Medicare Other | Source: Ambulatory Visit | Attending: Obstetrics and Gynecology | Admitting: Obstetrics and Gynecology

## 2021-05-30 DIAGNOSIS — N6489 Other specified disorders of breast: Secondary | ICD-10-CM | POA: Diagnosis not present

## 2021-05-30 DIAGNOSIS — R928 Other abnormal and inconclusive findings on diagnostic imaging of breast: Secondary | ICD-10-CM

## 2021-05-31 NOTE — Telephone Encounter (Signed)
Left message to call back  

## 2021-06-04 DIAGNOSIS — C801 Malignant (primary) neoplasm, unspecified: Secondary | ICD-10-CM

## 2021-06-04 HISTORY — DX: Malignant (primary) neoplasm, unspecified: C80.1

## 2021-06-08 ENCOUNTER — Ambulatory Visit
Admission: RE | Admit: 2021-06-08 | Discharge: 2021-06-08 | Disposition: A | Discharge status: Home / Self Care | Payer: Medicare Other | Source: Ambulatory Visit | Attending: Obstetrics and Gynecology | Admitting: Obstetrics and Gynecology

## 2021-06-08 ENCOUNTER — Encounter: Payer: Self-pay | Admitting: Internal Medicine

## 2021-06-08 DIAGNOSIS — R928 Other abnormal and inconclusive findings on diagnostic imaging of breast: Secondary | ICD-10-CM

## 2021-06-08 DIAGNOSIS — C50912 Malignant neoplasm of unspecified site of left female breast: Secondary | ICD-10-CM | POA: Diagnosis not present

## 2021-06-12 ENCOUNTER — Telehealth: Payer: Self-pay | Admitting: Hematology

## 2021-06-12 NOTE — Telephone Encounter (Signed)
LVM for patient to return call in reference to the upcoming clinic appointment for 5/24 at 1215, will mail packet to patient ?

## 2021-06-13 ENCOUNTER — Encounter: Payer: Medicare Other | Admitting: Internal Medicine

## 2021-06-14 ENCOUNTER — Ambulatory Visit (INDEPENDENT_AMBULATORY_CARE_PROVIDER_SITE_OTHER): Payer: Medicare Other

## 2021-06-14 VITALS — BP 124/64 | HR 54 | Temp 97.5°F | Resp 20 | Ht 60.0 in | Wt 123.4 lb

## 2021-06-14 DIAGNOSIS — J4540 Moderate persistent asthma, uncomplicated: Secondary | ICD-10-CM | POA: Diagnosis not present

## 2021-06-14 MED ORDER — OMALIZUMAB 150 MG/ML ~~LOC~~ SOSY
150.0000 mg | PREFILLED_SYRINGE | Freq: Once | SUBCUTANEOUS | Status: AC
  Administered 2021-06-14: 150 mg via SUBCUTANEOUS
  Filled 2021-06-14: qty 1

## 2021-06-14 NOTE — Progress Notes (Signed)
Diagnosis: Asthma ? ?Provider:  Marshell Garfinkel, MD ? ?Procedure: Injection ? ?Xolair (Omalizumab), Dose: 150 mg, Site: subcutaneous, Number of injections: 1 ? ?Discharge: Condition: Good, Destination: Home . AVS provided to patient.  ? ?Performed by:  Koren Shiver, RN  ? ? ? ?  ?

## 2021-06-19 ENCOUNTER — Telehealth: Payer: Self-pay | Admitting: Hematology

## 2021-06-19 NOTE — Telephone Encounter (Signed)
Spoke to patient to confirm afternoon clinic appointment for 5/24, paperwork sent via e-mail ?

## 2021-06-25 ENCOUNTER — Encounter: Payer: Self-pay | Admitting: *Deleted

## 2021-06-25 DIAGNOSIS — Z170 Estrogen receptor positive status [ER+]: Secondary | ICD-10-CM | POA: Insufficient documentation

## 2021-06-25 DIAGNOSIS — C50512 Malignant neoplasm of lower-outer quadrant of left female breast: Secondary | ICD-10-CM | POA: Insufficient documentation

## 2021-06-26 NOTE — Progress Notes (Signed)
Radiation Oncology         [id number]) [id number] ________________________________  Name: Kathy Whitaker        MRN: [id number]  Date of Service: 06/27/2021 DOB: 05/13/1933  CC:[name], MD  Kathy Kussmaul, MD     REFERRING PHYSICIAN: Autumn Messing III, MD   DIAGNOSIS: The encounter diagnosis was Malignant neoplasm of lower-outer quadrant of left breast of female, estrogen receptor positive ([place]).   HISTORY OF PRESENT ILLNESS: Kathy Whitaker is a 88 y.o. female seen in the multidisciplinary breast clinic for a new diagnosis of left breast cancer. The patient was noted to have a screening detected mass in the left breast.  She underwent diagnostic imaging including ultrasound, the lesion was seen initially showed no sonographic correlate but subsequent assessment mammographically showed the mass in the posterior left breast measuring 7 mm, the axilla on the left was negative for adenopathy.  She underwent biopsy on 06/08/2021 showing grade 1 invasive ductal carcinoma with associated DCIS.  Her cancer was ER/PR positive, HER2/neu negative with a Ki-67 of less than 5%.  She is seen today to discuss treatment recommendations of her cancer.[name]    PREVIOUS RADIATION THERAPY: {EXAM; YES/NO:19492::"No"}   PAST MEDICAL HISTORY:  Past Medical History:  Diagnosis Date   Asthma    COPD (chronic obstructive pulmonary disease) (HCC)    Depression    Dizziness    Hyperlipemia    Hypertension    Hypothyroidism    Prediabetes    SVD (spontaneous vaginal delivery)    x 2   Vitamin D deficiency        PAST SURGICAL HISTORY: Past Surgical History:  Procedure Laterality Date   CATARACT EXTRACTION Right 09/03/2019   Dr. Katy Fitch   DILATATION & CURRETTAGE/HYSTEROSCOPY WITH RESECTOCOPE N/A 09/10/2013   Procedure: DILATATION & CURETTAGE/HYSTEROSCOPY WITH RESECTOCOPE;  Surgeon: Darlyn Chamber, MD;  Location: [facility];  Service: Gynecology;  Laterality: N/A;   TONSILLECTOMY     TUBAL LIGATION Bilateral 1970   WISDOM  TOOTH EXTRACTION       FAMILY HISTORY:  Family History  Problem Relation Age of Onset   Thyroid disease Mother    Cancer Brother        Thyroid   Gout Brother    Hodgkin's lymphoma Brother    Other Father        unsure of medical history     SOCIAL HISTORY:  reports that she has never smoked. She has never used smokeless tobacco. She reports current alcohol use. She reports that she does not use drugs.   ALLERGIES: Codeine, Propoxyphene n-acetaminophen, and Shellfish-derived products   MEDICATIONS:  Current Outpatient Medications  Medication Sig Dispense Refill   Ascorbic Acid (VITAMIN C PO) Take 500 mg by mouth daily.     aspirin EC 81 MG tablet Take 81 mg by mouth daily.     bisoprolol-hydrochlorothiazide (ZIAC) 5-6.25 MG tablet Take  1 tablet  Daily  for BP 90 tablet 3   budesonide (PULMICORT) 0.25 MG/2ML nebulizer solution USE 1 VIAL  IN  NEBULIZER TWICE  DAILY - rinse mouth after treatment 120 mL 11   Cholecalciferol (VITAMIN D PO) Take 5,000 Units by mouth daily.     dicyclomine (BENTYL) 20 MG tablet Take 1/2 to 1 tablet 4 x /day before Meals & Bedtime for Abdominal Cramping , Bloating or Nausea 360 tablet 0   doxycycline (VIBRAMYCIN) 100 MG capsule Take 1 capsule BID for 1 week. 14 capsule 0   EPINEPHrine (  EPIPEN JR) 0.15 MG/0.3ML injection Inject 0.15 mg into the muscle as needed.     formoterol (PERFOROMIST) 20 MCG/2ML nebulizer solution USE 1 VIAL  IN  NEBULIZER TWICE  DAILY - morning and evening 120 mL 11   Ipratropium-Albuterol (COMBIVENT RESPIMAT) 20-100 MCG/ACT AERS respimat USE 1 INHALATION EVERY 6 HOURS AS NEEDED FOR WHEEZING 12 g 0   levothyroxine (SYNTHROID) 75 MCG tablet TAKE AS INSTRUCTED BY YOUR PRESCRIBER 90 tablet 3   loratadine (CLARITIN) 10 MG tablet Take 1 tablet (10 mg total) by mouth daily as needed for allergies. 30 tablet 1   meclizine (ANTIVERT) 25 MG tablet Take 1/2 to 1 tablet 3 x /day as needed for dizziness / Vertigo 90 tablet 11   omeprazole  (PRILOSEC) 40 MG capsule Take  1 capsule  Daily  to Prevent Heartburn & Indigestion 90 capsule 3   sertraline (ZOLOFT) 50 MG tablet Take  1 tablet  Daily  for mood. 90 tablet 3   zinc gluconate 50 MG tablet Take 50 mg by mouth daily.     Current Facility-Administered Medications  Medication Dose Route Frequency Provider Last Rate Last Admin   omalizumab Arvid Right) injection 150 mg  150 mg Subcutaneous Q14 Days [name], MD   150 mg at 01/02/18 0815   omalizumab Arvid Right) injection 150 mg  150 mg Subcutaneous Q14 Days [name], MD   150 mg at 03/19/18 1549     REVIEW OF SYSTEMS: On review of systems, the patient reports that she is doing ***     PHYSICAL EXAM:  Wt Readings from Last 3 Encounters:  06/14/21 123 lb 6.4 oz (56 kg)  05/15/21 122 lb 3.2 oz (55.4 kg)  05/14/21 121 lb 9.6 oz (55.2 kg)   Temp Readings from Last 3 Encounters:  06/14/21 (!) 97.5 F (36.4 C) (Oral)  05/15/21 97.9 F (36.6 C)  05/14/21 (!) 97.5 F (36.4 C) (Oral)   BP Readings from Last 3 Encounters:  06/14/21 124/64  05/15/21 110/70  05/14/21 130/72   Pulse Readings from Last 3 Encounters:  06/14/21 (!) 54  05/15/21 61  05/14/21 63    In general this is a well appearing *** female in no acute distress. She's alert and oriented x4 and appropriate throughout the examination. Cardiopulmonary assessment is negative for acute distress and she exhibits normal effort. Bilateral breast exam is deferred.    ECOG = ***  0 - Asymptomatic (Fully active, able to carry on all predisease activities without restriction)  1 - Symptomatic but completely ambulatory (Restricted in physically strenuous activity but ambulatory and able to carry out work of a light or sedentary nature. For example, light housework, office work)  2 - Symptomatic, <50% in bed during the day (Ambulatory and capable of all self care but unable to carry out any work activities. Up and about more than 50% of waking hours)  3 -  Symptomatic, >50% in bed, but not bedbound (Capable of only limited self-care, confined to bed or chair 50% or more of waking hours)  4 - Bedbound (Completely disabled. Cannot carry on any self-care. Totally confined to bed or chair)  5 - Death   Eustace Pen MM, Creech RH, Tormey DC, et al. [phone]). "Toxicity and response criteria of the [facility]". [place] Oncol. 5 (6): 649-55    LABORATORY DATA:  Lab Results  Component Value Date   WBC 6.1 05/15/2021   HGB 13.6 05/15/2021   HCT 41.5 05/15/2021  MCV 90.4 05/15/2021   PLT 287 05/15/2021   Lab Results  Component Value Date   NA 139 05/15/2021   K 4.9 05/15/2021   CL 100 05/15/2021   CO2 25 05/15/2021   Lab Results  Component Value Date   ALT 11 05/15/2021   AST 19 05/15/2021   ALKPHOS 68 08/20/2016   BILITOT 0.6 05/15/2021      RADIOGRAPHY: US BREAST LTD UNI LEFT INC AXILLA  Addendum Date: 06/26/2021   ADDENDUM REPORT: 06/26/2021 11:03 ADDENDUM: Mammographically, the mass in the posterior aspect of the LEFT breast measures 0.7 centimeters. Evaluation of the LEFT axilla is negative for adenopathy. Electronically Signed   By: Nolon Nations M.D.   On: 06/26/2021 11:03   Result Date: 06/26/2021 CLINICAL DATA:  The patient was called back for a left breast mass. EXAM: DIGITAL DIAGNOSTIC UNILATERAL LEFT MAMMOGRAM WITH TOMOSYNTHESIS AND CAD; ULTRASOUND LEFT BREAST LIMITED TECHNIQUE: Left digital diagnostic mammography and breast tomosynthesis was performed. The images were evaluated with computer-aided detection.; Targeted ultrasound examination of the left breast was performed. COMPARISON:  Previous exam(s). ACR Breast Density Category b: There are scattered areas of fibroglandular density. FINDINGS: The mass in the left breast at a posterior depth persists on additional imaging. On physical exam, no suspicious lumps are identified. Targeted ultrasound is performed, showing no sonographic correlate for the  left breast mass. IMPRESSION: Indeterminate left breast mass only identified on the cc view. RECOMMENDATION: Recommend stereotactic biopsy of the left breast mass. I have discussed the findings and recommendations with the patient. If applicable, a reminder letter will be sent to the patient regarding the next appointment. BI-RADS CATEGORY  4: Suspicious. Electronically Signed: By: Dorise Bullion III M.D. On: 05/30/2021 13:14  MM DIAG BREAST TOMO UNI LEFT  Addendum Date: 06/26/2021   ADDENDUM REPORT: 06/26/2021 11:03 ADDENDUM: Mammographically, the mass in the posterior aspect of the LEFT breast measures 0.7 centimeters. Evaluation of the LEFT axilla is negative for adenopathy. Electronically Signed   By: Nolon Nations M.D.   On: 06/26/2021 11:03   Result Date: 06/26/2021 CLINICAL DATA:  The patient was called back for a left breast mass. EXAM: DIGITAL DIAGNOSTIC UNILATERAL LEFT MAMMOGRAM WITH TOMOSYNTHESIS AND CAD; ULTRASOUND LEFT BREAST LIMITED TECHNIQUE: Left digital diagnostic mammography and breast tomosynthesis was performed. The images were evaluated with computer-aided detection.; Targeted ultrasound examination of the left breast was performed. COMPARISON:  Previous exam(s). ACR Breast Density Category b: There are scattered areas of fibroglandular density. FINDINGS: The mass in the left breast at a posterior depth persists on additional imaging. On physical exam, no suspicious lumps are identified. Targeted ultrasound is performed, showing no sonographic correlate for the left breast mass. IMPRESSION: Indeterminate left breast mass only identified on the cc view. RECOMMENDATION: Recommend stereotactic biopsy of the left breast mass. I have discussed the findings and recommendations with the patient. If applicable, a reminder letter will be sent to the patient regarding the next appointment. BI-RADS CATEGORY  4: Suspicious. Electronically Signed: By: Dorise Bullion III M.D. On: 05/30/2021  13:14  MM CLIP PLACEMENT LEFT  Result Date: 06/08/2021 CLINICAL DATA:  Status post stereo biopsy left breast mass. EXAM: 3D DIAGNOSTIC LEFT MAMMOGRAM POST STEREOTACTIC BIOPSY COMPARISON:  Previous exam(s). FINDINGS: 3D Mammographic images were obtained following stereotactic guided biopsy of left breast mass. The biopsy marking clip is in expected position at the site of biopsy. IMPRESSION: Appropriate positioning of the ribbon shaped biopsy marking clip at the site of biopsy in the lower  outer left breast. Final Assessment: Post Procedure Mammograms for Marker Placement Electronically Signed   By: Lovey Newcomer M.D.   On: 06/08/2021 12:38  MM LT BREAST BX W LOC DEV 1ST LESION IMAGE BX SPEC STEREO GUIDE  Addendum Date: 06/13/2021   ADDENDUM REPORT: 06/13/2021 15:10 ADDENDUM: Pathology revealed INVASIVE DUCTAL CARCINOMA, GRADE 1. - DUCTAL CARCINOMA IN SITU of the LEFT breast, lower outer (ribbon clip). This was found to be concordant by Dr. Lovey Newcomer. Pathology results were discussed with the patient and her daughter Labrisha Wuellner) by telephone. The patient reported doing well after the biopsy with tenderness and small knot at the site. Post biopsy instructions and care were reviewed and questions were answered. The patient was encouraged to call [facility] for any additional concerns. Per patient request, The patient was referred to [facility] on Jun 27, 2021. Pathology results reported by Stacie Acres RN on 06/13/2021. Electronically Signed   By: Lovey Newcomer M.D.   On: 06/13/2021 15:10   Result Date: 06/13/2021 CLINICAL DATA:  Patient with indeterminate left breast asymmetry. EXAM: LEFT BREAST STEREOTACTIC CORE NEEDLE BIOPSY COMPARISON:  None Available. FINDINGS: The patient and I discussed the procedure of stereotactic-guided biopsy including benefits and alternatives. We discussed the high likelihood of  a successful procedure. We discussed the risks of the procedure including infection, bleeding, tissue injury, clip migration, and inadequate sampling. Informed written consent was given. The usual time out protocol was performed immediately prior to the procedure. Using sterile technique and 1% Lidocaine as local anesthetic, under stereotactic guidance, a 9 gauge vacuum assisted device was used to perform core needle biopsy of asymmetry within the lower outer left breast using a cranial approach. Specimen radiograph was performed showing calcifications. Specimens with calcifications are identified for pathology. Lesion quadrant: Lower outer quadrant At the conclusion of the procedure, ribbon shaped tissue marker clip was deployed into the biopsy cavity. Follow-up 2-view mammogram was performed and dictated separately. IMPRESSION: Stereotactic-guided biopsy of left breast asymmetry and calcifications. No apparent complications. Electronically Signed: By: Lovey Newcomer M.D. On: 06/08/2021 12:37      IMPRESSION/PLAN: 1. Stage IA, [id number], grade 1, ER/PR positive invasive ductal carcinoma of the left breast. Dr. Lisbeth Renshaw discusses the pathology findings and reviews the nature of *** breast disease. The consensus from the breast conference includes breast conservation with lumpectomy. Dr. Lisbeth Renshaw discusses the rationale for external radiotherapy to the breast  to reduce risks of local recurrence followed by antiestrogen therapy. We discussed the risks, benefits, short, and long term effects of radiotherapy, as well as the curative intent, and the patient is interested in proceeding. Dr. Lisbeth Renshaw discusses the delivery and logistics of radiotherapy and anticipates a course of *** weeks of radiotherapy. We will see her back a few weeks after surgery to discuss the simulation process and anticipate we starting radiotherapy about 4-6 weeks after surgery.   2. Possible genetic predisposition to malignancy. The patient is a  candidate for genetic testing given *** personal and family history. *** was offered referral and ***.   In a visit lasting *** minutes, greater than 50% of the time was spent face to face reviewing her case, as well as in preparation of, discussing, and coordinating the patient's care.  The above documentation reflects my direct findings during this shared patient visit. Please see the separate note by Dr. Lisbeth Renshaw on this date for the remainder of the patient's  plan of care.    Carola Rhine, [facility]    **Disclaimer: This note was dictated with voice recognition software. Similar sounding words can inadvertently be transcribed and this note may contain transcription errors which may not have been corrected upon publication of note.**

## 2021-06-27 ENCOUNTER — Inpatient Hospital Stay: Payer: Medicare Other | Attending: Hematology

## 2021-06-27 ENCOUNTER — Encounter: Payer: Self-pay | Admitting: General Practice

## 2021-06-27 ENCOUNTER — Encounter: Payer: Self-pay | Admitting: *Deleted

## 2021-06-27 ENCOUNTER — Other Ambulatory Visit: Payer: Self-pay

## 2021-06-27 ENCOUNTER — Inpatient Hospital Stay (HOSPITAL_BASED_OUTPATIENT_CLINIC_OR_DEPARTMENT_OTHER): Payer: Medicare Other | Admitting: Hematology

## 2021-06-27 ENCOUNTER — Ambulatory Visit: Payer: Medicare Other | Admitting: Physical Therapy

## 2021-06-27 ENCOUNTER — Encounter: Payer: Self-pay | Admitting: Hematology

## 2021-06-27 ENCOUNTER — Encounter: Payer: Self-pay | Admitting: Internal Medicine

## 2021-06-27 ENCOUNTER — Inpatient Hospital Stay: Payer: Medicare Other | Admitting: Genetic Counselor

## 2021-06-27 ENCOUNTER — Ambulatory Visit: Payer: Self-pay | Admitting: General Surgery

## 2021-06-27 ENCOUNTER — Ambulatory Visit
Admission: RE | Admit: 2021-06-27 | Discharge: 2021-06-27 | Disposition: A | Discharge status: Home / Self Care | Payer: Medicare Other | Source: Ambulatory Visit | Attending: Radiation Oncology | Admitting: Radiation Oncology

## 2021-06-27 VITALS — BP 128/62 | HR 60 | Temp 98.1°F | Resp 18 | Ht 59.5 in | Wt 125.8 lb

## 2021-06-27 DIAGNOSIS — C50112 Malignant neoplasm of central portion of left female breast: Secondary | ICD-10-CM | POA: Diagnosis not present

## 2021-06-27 DIAGNOSIS — Z79899 Other long term (current) drug therapy: Secondary | ICD-10-CM

## 2021-06-27 DIAGNOSIS — I10 Essential (primary) hypertension: Secondary | ICD-10-CM | POA: Diagnosis not present

## 2021-06-27 DIAGNOSIS — Z170 Estrogen receptor positive status [ER+]: Secondary | ICD-10-CM

## 2021-06-27 DIAGNOSIS — F32A Depression, unspecified: Secondary | ICD-10-CM | POA: Insufficient documentation

## 2021-06-27 DIAGNOSIS — Z808 Family history of malignant neoplasm of other organs or systems: Secondary | ICD-10-CM | POA: Diagnosis not present

## 2021-06-27 DIAGNOSIS — C50512 Malignant neoplasm of lower-outer quadrant of left female breast: Secondary | ICD-10-CM

## 2021-06-27 DIAGNOSIS — E039 Hypothyroidism, unspecified: Secondary | ICD-10-CM | POA: Diagnosis not present

## 2021-06-27 LAB — CMP (CANCER CENTER ONLY)
ALT: 12 U/L (ref 0–44)
AST: 18 U/L (ref 15–41)
Albumin: 4.1 g/dL (ref 3.5–5.0)
Alkaline Phosphatase: 85 U/L (ref 38–126)
Anion gap: 4 — ABNORMAL LOW (ref 5–15)
BUN: 18 mg/dL (ref 8–23)
CO2: 32 mmol/L (ref 22–32)
Calcium: 9.7 mg/dL (ref 8.9–10.3)
Chloride: 100 mmol/L (ref 98–111)
Creatinine: 0.85 mg/dL (ref 0.44–1.00)
GFR, Estimated: >60 mL/min (ref >60)
Glucose, Bld: 116 mg/dL — ABNORMAL HIGH (ref 70–99)
Potassium: 4.4 mmol/L (ref 3.5–5.1)
Sodium: 136 mmol/L (ref 135–145)
Total Bilirubin: 0.4 mg/dL (ref 0.3–1.2)
Total Protein: 7.1 g/dL (ref 6.5–8.1)

## 2021-06-27 LAB — CBC WITH DIFFERENTIAL (CANCER CENTER ONLY)
Abs Immature Granulocytes: 0.01 10*3/uL (ref 0.00–0.07)
Basophils Absolute: 0 10*3/uL (ref 0.0–0.1)
Basophils Relative: 1 %
Eosinophils Absolute: 0.2 10*3/uL (ref 0.0–0.5)
Eosinophils Relative: 3 %
HCT: 36.4 % (ref 36.0–46.0)
Hemoglobin: 12 g/dL (ref 12.0–15.0)
Immature Granulocytes: 0 %
Lymphocytes Relative: 18 %
Lymphs Abs: 1 10*3/uL (ref 0.7–4.0)
MCH: 29.6 pg (ref 26.0–34.0)
MCHC: 33 g/dL (ref 30.0–36.0)
MCV: 89.9 fL (ref 80.0–100.0)
Monocytes Absolute: 0.5 10*3/uL (ref 0.1–1.0)
Monocytes Relative: 9 %
Neutro Abs: 3.9 10*3/uL (ref 1.7–7.7)
Neutrophils Relative %: 69 %
Platelet Count: 253 10*3/uL (ref 150–400)
RBC: 4.05 MIL/uL (ref 3.87–5.11)
RDW: 14.8 % (ref 11.5–15.5)
WBC Count: 5.7 10*3/uL (ref 4.0–10.5)
nRBC: 0 % (ref 0.0–0.2)

## 2021-06-27 LAB — GENETIC SCREENING ORDER

## 2021-06-27 NOTE — Research (Signed)
Trial:  [employer] 2021-05 - Specimen Collection Study to Evaluate Biomarkers in Subjects with Cancer   Patient Kathy Whitaker was identified by Dr. Burr Medico as a potential candidate for the above listed study.  This Clinical Research Nurse met with [name], [id number], on 06/27/21 in a manner and location that ensures patient privacy to discuss participation in the above listed research study.  Patient is Accompanied by her daughter, [name] .  A copy of the informed consent document with embedded HIPAA language was provided to the patient.  Patient reads, speaks, and understands [place].   Patient was provided with the business card of this Nurse and encouraged to contact the research team with any questions.  Approximately 5 minutes were spent with the patient reviewing the informed consent documents.  Patient was provided the option of taking informed consent documents home to review and was encouraged to review at their convenience with their support network, including other care providers. Patient took the consent documents home to review.  Foye Spurling, BSN, RN, [employer] Research Nurse II 06/27/2021 3:15 PM

## 2021-06-27 NOTE — Progress Notes (Signed)
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 team/resources, reviewing distress screen per protocol.  The patient scored a  [unspecified]  on the Psychosocial Distress Thermometer which indicates  [unspecified]  distress. Also assessed for distress and other psychosocial needs.    06/27/2021  ONCBCN DISTRESS SCREENING   Emotional problem type Nervousness/Anxiety   Referral to support programs Yes    Kathy Whitaker reports very low distress and significant relief after meeting team and learning scope of diagnosis and treatment at [facility]. One joy in the family's life is the anticipation of Kathy Whitaker's first great grandchild.  Provided empathic listening, normalization of feelings, and introduction to [employer] support programming.  Follow up needed: No. Kathy Whitaker prefers to reach out as needed/desired for follow-up support.   [place], [place], [facility] Pager [phone] Voicemail [phone]

## 2021-06-27 NOTE — Progress Notes (Signed)
Kathy Whitaker   [phone] Fax:([phone] [place] Note   Patient Care Team: [name], MD as PCP - General (Internal Medicine) Arvella Nigh, MD as Consulting Physician (Obstetrics and Gynecology) Mauro Kaufmann, RN as Oncology Nurse Navigator Rockwell Germany, RN as Oncology Nurse Navigator Jovita Kussmaul, MD as Consulting Physician (General Surgery) Truitt Merle, MD as Consulting Physician (Hematology) Kyung Rudd, MD as Consulting Physician (Radiation Oncology)  Date of Service:  06/27/2021   CHIEF COMPLAINTS/PURPOSE OF CONSULTATION:  Left Breast Cancer, ER+  REFERRING PHYSICIAN:  [facility]   ASSESSMENT & PLAN:  Kathy Whitaker is a 88 y.o. post-menopausal female with a history of HTN, hypothyroidism  1. Malignant neoplasm of lower-outer quadrant of left breast, IDC, Stage IA, c(T1b, N0), ER+/PR+/HER2-, Grade 1 -found on screening mammogram. Left MM and [place] on 05/30/21 showed a 0.7 cm mass without [place] correlate. Biopsy on 06/08/21 showed IDC and DCIS. --We discussed her imaging findings and the biopsy results in great details. -Given the small size of her tumor, she likely need a lumpectomy. She is agreeable with that. She was seen by Dr. Marlou Starks today and likely will proceed with surgery soon.  -Giving the strong ER and PR expression in her postmenopausal status, I recommend adjuvant endocrine therapy with tamoxifen or aromatase inhibitor for a total of 5 years to reduce the risk of cancer recurrence. Potential benefits and side effects were discussed with patient and she is interested.  Due to her arthritis and osteopenia, tamoxifen is probably better.  Given her advanced age, if she does have side effect, I have low threshold to stop.  I reviewed the potential benefit and side effects with patient and her daughter in detail. -She was also seen by radiation oncologist Dr. Lisbeth Renshaw today.  Given her advanced age and very early stage disease,  it is reasonable to forego adjuvant radiation. -We also discussed the breast cancer surveillance after her surgery. She will continue annual screening mammogram, self exam, and a routine office visit with lab and exam with [place]. -I encouraged her to have healthy diet and exercise regularly.    PLAN:  -proceed with lumpectomy soon -I will see her back 2-3 weeks after lumpectomy to finalize antiestrogen therapy    Oncology History Overview Note   Cancer Staging  Malignant neoplasm of lower-outer quadrant of left breast of female, estrogen receptor positive ([place]) Staging form: Breast, AJCC 8th Edition - Clinical stage from 06/08/2021: Stage IA (cT1b, cN0, cM0, G1, ER+, PR+, HER2-) - Signed by Truitt Merle, MD on 06/27/2021    Extrinsic asthma  Malignant neoplasm of lower-outer quadrant of left breast of female, estrogen receptor positive ([place])  05/30/2021 Mammogram   CLINICAL DATA:  The patient was called back for a left breast mass.   EXAM: DIGITAL DIAGNOSTIC UNILATERAL LEFT MAMMOGRAM WITH TOMOSYNTHESIS AND CAD; ULTRASOUND LEFT BREAST LIMITED  IMPRESSION: Indeterminate left breast mass only identified on the cc view.  ADDENDUM: Mammographically, the mass in the posterior aspect of the LEFT breast measures 0.7 centimeters.   Evaluation of the LEFT axilla is negative for adenopathy.   06/08/2021 Cancer Staging   Staging form: Breast, AJCC 8th Edition - Clinical stage from 06/08/2021: Stage IA (cT1b, cN0, cM0, G1, ER+, PR+, HER2-) - Signed by Truitt Merle, MD on 06/27/2021 Stage prefix: Initial diagnosis Histologic grading system: 3 grade system    06/08/2021 Initial Biopsy   Diagnosis Breast, left, needle core biopsy - INVASIVE DUCTAL  CARCINOMA, GRADE 1. - DUCTAL CARCINOMA IN SITU. - SEE NOTE. Diagnosis Note The greatest tumor dimension is 0.4 cm. The DCIS has low to intermediate nuclear grade.  PROGNOSTIC INDICATORS Results: The tumor cells are NEGATIVE for Her2 (1+). Estrogen  Receptor: 100%, POSITIVE, STRONG STAINING INTENSITY Progesterone Receptor: 100%, POSITIVE, STRONG STAINING INTENSITY Proliferation Marker Ki67: <5%    06/25/2021 Initial Diagnosis   Malignant neoplasm of lower-outer quadrant of left breast of female, estrogen receptor positive ([place])       HISTORY OF PRESENTING ILLNESS:  Kathy Whitaker 88 y.o. female is a here because of breast cancer. The patient was referred by [facility]. The patient presents to the clinic today accompanied by her daughter.   She had routine screening mammography showing a possible abnormality in the left breast. She underwent left diagnostic mammography and left breast ultrasonography on 05/30/21 showing: 0.7 cm mass in posterior breast without sonographic correlate.  Biopsy on 06/08/21 showed: invasive ductal carcinoma, grade 1; DCIS, low to intermediate grade. Prognostic indicators significant for: estrogen receptor, 100% positive and progesterone receptor, 100% positive. Proliferation marker Ki67 at <5%. HER2 negative.   Today the patient notes they felt/feeling prior/after... -no breast concerns or other complaints  She has a PMHx of.... -HTN -hypothyroidism -depression, on Zoloft  Socially... -she is widowed (husband passed 2022) -the only family history of cancer to her knowledge is non-Hodgkin's lymphoma in her brother. -consumes 2-3 alcoholic drinks a week   GYN HISTORY  Menarchal: 88 years old LMP:  Contraceptive: HRT: used for 3-4 years GP:2   REVIEW OF SYSTEMS:    Constitutional: Denies fevers, chills or abnormal night sweats Eyes: Denies blurriness of vision, double vision or watery eyes Ears, nose, mouth, throat, and face: Denies mucositis or sore throat Respiratory: Denies cough, dyspnea or wheezes Cardiovascular: Denies palpitation, chest discomfort or lower extremity swelling Gastrointestinal:  Denies nausea, heartburn or change in bowel habits Skin: Denies abnormal skin  rashes Lymphatics: Denies new lymphadenopathy or easy bruising Neurological:Denies numbness, tingling or new weaknesses Behavioral/Psych: Mood is stable, no new changes  All other systems were reviewed with the patient and are negative.   MEDICAL HISTORY:  Past Medical History:  Diagnosis Date   Asthma    COPD (chronic obstructive pulmonary disease) (HCC)    Depression    Dizziness    Hyperlipemia    Hypertension    Hypothyroidism    Prediabetes    SVD (spontaneous vaginal delivery)    x 2   Vitamin D deficiency     SURGICAL HISTORY: Past Surgical History:  Procedure Laterality Date   CATARACT EXTRACTION Right 09/03/2019   Dr. Katy Fitch   DILATATION & CURRETTAGE/HYSTEROSCOPY WITH RESECTOCOPE N/A 09/10/2013   Procedure: DILATATION & CURETTAGE/HYSTEROSCOPY WITH RESECTOCOPE;  Surgeon: Darlyn Chamber, MD;  Location: [facility];  Service: Gynecology;  Laterality: N/A;   TONSILLECTOMY     TUBAL LIGATION Bilateral 1970   WISDOM TOOTH EXTRACTION      SOCIAL HISTORY: Social History   Socioeconomic History   Marital status: Widowed    Spouse name: Not on file   Number of children: 2   Years of education: HS   Highest education level: Not on file  Occupational History   Occupation: Retired  Tobacco Use   Smoking status: Never   Smokeless tobacco: Never  Vaping Use   Vaping Use: Never used  Substance and Sexual Activity   Alcohol use: Yes    Alcohol/week: 3.0 standard drinks    Types:  3 Glasses of wine per week    Comment: Wine daily   Drug use: No   Sexual activity: Yes    Birth control/protection: Surgical  Other Topics Concern   Not on file  Social History Narrative   Lives at home with her husband.   Right-handed.   3 cups caffeine per day.   Social Determinants of Health   Financial Resource Strain: Not on file  Food Insecurity: Not on file  Transportation Needs: Not on file  Physical Activity: Not on file  Stress: Not on file  Social Connections: Not on file   Intimate Partner Violence: Not on file    FAMILY HISTORY: Family History  Problem Relation Age of Onset   Thyroid disease Mother    Cancer Brother        Thyroid   Gout Brother    Hodgkin's lymphoma Brother    Other Father        unsure of medical history    ALLERGIES:  is allergic to codeine, propoxyphene n-acetaminophen, and shellfish-derived products.  MEDICATIONS:  Current Outpatient Medications  Medication Sig Dispense Refill   Ascorbic Acid (VITAMIN C PO) Take 500 mg by mouth daily.     aspirin EC 81 MG tablet Take 81 mg by mouth daily.     bisoprolol-hydrochlorothiazide (ZIAC) 5-6.25 MG tablet Take  1 tablet  Daily  for BP 90 tablet 3   budesonide (PULMICORT) 0.25 MG/2ML nebulizer solution USE 1 VIAL  IN  NEBULIZER TWICE  DAILY - rinse mouth after treatment 120 mL 11   Cholecalciferol (VITAMIN D PO) Take 5,000 Units by mouth daily.     dicyclomine (BENTYL) 20 MG tablet Take 1/2 to 1 tablet 4 x /day before Meals & Bedtime for Abdominal Cramping , Bloating or Nausea 360 tablet 0   formoterol (PERFOROMIST) 20 MCG/2ML nebulizer solution USE 1 VIAL  IN  NEBULIZER TWICE  DAILY - morning and evening 120 mL 11   Ipratropium-Albuterol (COMBIVENT RESPIMAT) 20-100 MCG/ACT AERS respimat USE 1 INHALATION EVERY 6 HOURS AS NEEDED FOR WHEEZING 12 g 0   levothyroxine (SYNTHROID) 75 MCG tablet TAKE AS INSTRUCTED BY YOUR PRESCRIBER 90 tablet 3   loratadine (CLARITIN) 10 MG tablet Take 1 tablet (10 mg total) by mouth daily as needed for allergies. 30 tablet 1   meclizine (ANTIVERT) 25 MG tablet Take 1/2 to 1 tablet 3 x /day as needed for dizziness / Vertigo 90 tablet 11   omeprazole (PRILOSEC) 40 MG capsule Take  1 capsule  Daily  to Prevent Heartburn & Indigestion 90 capsule 3   sertraline (ZOLOFT) 50 MG tablet Take  1 tablet  Daily  for mood. 90 tablet 3   zinc gluconate 50 MG tablet Take 50 mg by mouth daily.     doxycycline (VIBRAMYCIN) 100 MG capsule Take 1 capsule BID for 1 week.  (Patient not taking: Reported on 06/27/2021) 14 capsule 0   EPINEPHrine (EPIPEN JR) 0.15 MG/0.3ML injection Inject 0.15 mg into the muscle as needed. (Patient not taking: Reported on 06/27/2021)     Current Facility-Administered Medications  Medication Dose Route Frequency Provider Last Rate Last Admin   omalizumab Arvid Right) injection 150 mg  150 mg Subcutaneous Q14 Days [name], MD   150 mg at 01/02/18 0815   omalizumab Arvid Right) injection 150 mg  150 mg Subcutaneous Q14 Days [name], MD   150 mg at 03/19/18 1549    PHYSICAL EXAMINATION: ECOG PERFORMANCE STATUS: 1 - Symptomatic but completely  ambulatory  Vitals:   06/27/21 1237  BP: 128/62  Pulse: 60  Resp: 18  Temp: 98.1 F (36.7 C)  SpO2: 97%   Filed Weights   06/27/21 1237  Weight: 125 lb 12.8 oz (57.1 kg)    GENERAL:alert, no distress and comfortable SKIN: skin color, texture, turgor are normal, no rashes or significant lesions EYES: normal, Conjunctiva are pink and non-injected, sclera clear  NECK: supple, thyroid normal size, non-tender, without nodularity LYMPH:  no palpable lymphadenopathy in the cervical, axillary LUNGS: clear to auscultation and percussion with normal breathing effort HEART: regular rate & rhythm and no murmurs and no lower extremity edema ABDOMEN:abdomen soft, non-tender and normal bowel sounds Musculoskeletal:no cyanosis of digits and no clubbing  NEURO: alert & oriented x 3 with fluent speech, no focal motor/sensory deficits BREAST: 2-3 cm palpable lump, likely related to post-biopsy bruising. No palpable mass, nodules or adenopathy bilaterally. Breast exam benign.  LABORATORY DATA:  I have reviewed the data as listed    Latest Ref Rng & Units 06/27/2021   12:21 PM 05/15/2021   12:00 PM 05/03/2021    2:51 PM  CBC  WBC 4.0 - 10.5 K/uL 5.7   6.1   7.9    Hemoglobin 12.0 - 15.0 g/dL 12.0   13.6   12.5    Hematocrit 36.0 - 46.0 % 36.4   41.5   38.3    Platelets 150 - 400 K/uL 253    287   274         Latest Ref Rng & Units 06/27/2021   12:21 PM 05/15/2021   12:00 PM 05/03/2021    2:51 PM  CMP  Glucose 70 - 99 mg/dL 116   89   120    BUN 8 - 23 mg/dL _0 Creatinine 0.44 - 1.00 mg/dL 0.85   0.71   0.83    Sodium 135 - 145 mmol/L 136   139   137    Potassium 3.5 - 5.1 mmol/L 4.4   4.9   4.4    Chloride 98 - 111 mmol/L 100   100   99    CO2 22 - 32 mmol/L 32   25   28    Calcium 8.9 - 10.3 mg/dL 9.7   10.3   9.6    Total Protein 6.5 - 8.1 g/dL 7.1   7.1   6.7    Total Bilirubin 0.3 - 1.2 mg/dL 0.4   0.6   0.4    Alkaline Phos 38 - 126 U/L 85      AST 15 - 41 U/L _1 ALT 0 - 44 U/L _2 RADIOGRAPHIC STUDIES: I have personally reviewed the radiological images as listed and agreed with the findings in the report. US BREAST LTD UNI LEFT INC AXILLA  Addendum Date: 06/26/2021   ADDENDUM REPORT: 06/26/2021 11:03 ADDENDUM: Mammographically, the mass in the posterior aspect of the LEFT breast measures 0.7 centimeters. Evaluation of the LEFT axilla is negative for adenopathy. Electronically Signed   By: Nolon Nations M.D.   On: 06/26/2021 11:03   Result Date: 06/26/2021 CLINICAL DATA:  The patient was called back for a left breast mass. EXAM: DIGITAL DIAGNOSTIC UNILATERAL LEFT MAMMOGRAM WITH TOMOSYNTHESIS AND CAD; ULTRASOUND LEFT BREAST LIMITED TECHNIQUE: Left digital diagnostic mammography and breast tomosynthesis was performed.  The images were evaluated with computer-aided detection.; Targeted ultrasound examination of the left breast was performed. COMPARISON:  Previous exam(s). ACR Breast Density Category b: There are scattered areas of fibroglandular density. FINDINGS: The mass in the left breast at a posterior depth persists on additional imaging. On physical exam, no suspicious lumps are identified. Targeted ultrasound is performed, showing no sonographic correlate for the left breast mass. IMPRESSION: Indeterminate left breast mass  only identified on the cc view. RECOMMENDATION: Recommend stereotactic biopsy of the left breast mass. I have discussed the findings and recommendations with the patient. If applicable, a reminder letter will be sent to the patient regarding the next appointment. BI-RADS CATEGORY  4: Suspicious. Electronically Signed: By: Dorise Bullion III M.D. On: 05/30/2021 13:14  MM DIAG BREAST TOMO UNI LEFT  Addendum Date: 06/26/2021   ADDENDUM REPORT: 06/26/2021 11:03 ADDENDUM: Mammographically, the mass in the posterior aspect of the LEFT breast measures 0.7 centimeters. Evaluation of the LEFT axilla is negative for adenopathy. Electronically Signed   By: Nolon Nations M.D.   On: 06/26/2021 11:03   Result Date: 06/26/2021 CLINICAL DATA:  The patient was called back for a left breast mass. EXAM: DIGITAL DIAGNOSTIC UNILATERAL LEFT MAMMOGRAM WITH TOMOSYNTHESIS AND CAD; ULTRASOUND LEFT BREAST LIMITED TECHNIQUE: Left digital diagnostic mammography and breast tomosynthesis was performed. The images were evaluated with computer-aided detection.; Targeted ultrasound examination of the left breast was performed. COMPARISON:  Previous exam(s). ACR Breast Density Category b: There are scattered areas of fibroglandular density. FINDINGS: The mass in the left breast at a posterior depth persists on additional imaging. On physical exam, no suspicious lumps are identified. Targeted ultrasound is performed, showing no sonographic correlate for the left breast mass. IMPRESSION: Indeterminate left breast mass only identified on the cc view. RECOMMENDATION: Recommend stereotactic biopsy of the left breast mass. I have discussed the findings and recommendations with the patient. If applicable, a reminder letter will be sent to the patient regarding the next appointment. BI-RADS CATEGORY  4: Suspicious. Electronically Signed: By: Dorise Bullion III M.D. On: 05/30/2021 13:14  MM CLIP PLACEMENT LEFT  Result Date: 06/08/2021 CLINICAL  DATA:  Status post stereo biopsy left breast mass. EXAM: 3D DIAGNOSTIC LEFT MAMMOGRAM POST STEREOTACTIC BIOPSY COMPARISON:  Previous exam(s). FINDINGS: 3D Mammographic images were obtained following stereotactic guided biopsy of left breast mass. The biopsy marking clip is in expected position at the site of biopsy. IMPRESSION: Appropriate positioning of the ribbon shaped biopsy marking clip at the site of biopsy in the lower outer left breast. Final Assessment: Post Procedure Mammograms for Marker Placement Electronically Signed   By: Lovey Newcomer M.D.   On: 06/08/2021 12:38  MM LT BREAST BX W LOC DEV 1ST LESION IMAGE BX SPEC STEREO GUIDE  Addendum Date: 06/13/2021   ADDENDUM REPORT: 06/13/2021 15:10 ADDENDUM: Pathology revealed INVASIVE DUCTAL CARCINOMA, GRADE 1. - DUCTAL CARCINOMA IN SITU of the LEFT breast, lower outer (ribbon clip). This was found to be concordant by Dr. Lovey Newcomer. Pathology results were discussed with the patient and her daughter Kimbria Camposano) by telephone. The patient reported doing well after the biopsy with tenderness and small knot at the site. Post biopsy instructions and care were reviewed and questions were answered. The patient was encouraged to call [facility] for any additional concerns. Per patient request, The patient was referred to [facility] on Jun 27, 2021. Pathology results reported by Manuela Schwartz  Beulah Gandy RN on 06/13/2021. Electronically Signed   By: Lovey Newcomer M.D.   On: 06/13/2021 15:10   Result Date: 06/13/2021 CLINICAL DATA:  Patient with indeterminate left breast asymmetry. EXAM: LEFT BREAST STEREOTACTIC CORE NEEDLE BIOPSY COMPARISON:  None Available. FINDINGS: The patient and I discussed the procedure of stereotactic-guided biopsy including benefits and alternatives. We discussed the high likelihood of a successful procedure. We discussed the risks of the procedure  including infection, bleeding, tissue injury, clip migration, and inadequate sampling. Informed written consent was given. The usual time out protocol was performed immediately prior to the procedure. Using sterile technique and 1% Lidocaine as local anesthetic, under stereotactic guidance, a 9 gauge vacuum assisted device was used to perform core needle biopsy of asymmetry within the lower outer left breast using a cranial approach. Specimen radiograph was performed showing calcifications. Specimens with calcifications are identified for pathology. Lesion quadrant: Lower outer quadrant At the conclusion of the procedure, ribbon shaped tissue marker clip was deployed into the biopsy cavity. Follow-up 2-view mammogram was performed and dictated separately. IMPRESSION: Stereotactic-guided biopsy of left breast asymmetry and calcifications. No apparent complications. Electronically Signed: By: Lovey Newcomer M.D. On: 06/08/2021 12:37    No orders of the defined types were placed in this encounter.   All questions were answered. The patient knows to call the clinic with any problems, questions or concerns. The total time spent in the appointment was 50 minutes.     Truitt Merle, MD 06/27/2021 5:32 PM  I, Wilburn Mylar, am acting as scribe for Truitt Merle, MD.   I have reviewed the above documentation for accuracy and completeness, and I agree with the above.

## 2021-06-29 ENCOUNTER — Encounter: Payer: Self-pay | Admitting: Genetic Counselor

## 2021-06-29 ENCOUNTER — Encounter: Payer: Self-pay | Admitting: *Deleted

## 2021-06-29 ENCOUNTER — Encounter: Payer: Self-pay | Admitting: Internal Medicine

## 2021-06-29 ENCOUNTER — Other Ambulatory Visit: Payer: Self-pay | Admitting: General Surgery

## 2021-06-29 DIAGNOSIS — R928 Other abnormal and inconclusive findings on diagnostic imaging of breast: Secondary | ICD-10-CM

## 2021-06-29 DIAGNOSIS — Z808 Family history of malignant neoplasm of other organs or systems: Secondary | ICD-10-CM | POA: Insufficient documentation

## 2021-06-29 HISTORY — DX: Family history of malignant neoplasm of other organs or systems: Z80.8

## 2021-06-29 NOTE — Progress Notes (Signed)
REFERRING PROVIDER: Truitt Merle, MD [address] [place],  [place] [address]   PRIMARY PROVIDER:  [name], MD  PRIMARY REASON FOR VISIT:  1. Malignant neoplasm of lower-outer quadrant of left breast of female, estrogen receptor positive ([place])   2. Family history of thyroid cancer     HISTORY OF PRESENT ILLNESS:   Ms. Lupien, a 88 y.o. female, was seen for a [place] cancer genetics consultation during the breast multidisciplinary clinic at the request of Dr. Burr Medico due to a personal history of cancer.  Ms. Hodgkiss presents to clinic today to discuss the possibility of a hereditary predisposition to cancer, to discuss genetic testing, and to further clarify her future cancer risks, as well as potential cancer risks for family members.   In 2023, at the age of 36, Ms. Gilden was diagnosed with invasive ductal carcinoma of the left breast. The preliminary treatment plan includes breast conserving surgery and anti-estrogens.    CANCER HISTORY:  Oncology History Overview Note   Cancer Staging  Malignant neoplasm of lower-outer quadrant of left breast of female, estrogen receptor positive ([place]) Staging form: Breast, AJCC 8th Edition - Clinical stage from 06/08/2021: Stage IA (cT1b, cN0, cM0, G1, ER+, PR+, HER2-) - Signed by Truitt Merle, MD on 06/27/2021    Extrinsic asthma  Malignant neoplasm of lower-outer quadrant of left breast of female, estrogen receptor positive ([place])  05/30/2021 Mammogram   CLINICAL DATA:  The patient was called back for a left breast mass.   EXAM: DIGITAL DIAGNOSTIC UNILATERAL LEFT MAMMOGRAM WITH TOMOSYNTHESIS AND CAD; ULTRASOUND LEFT BREAST LIMITED  IMPRESSION: Indeterminate left breast mass only identified on the cc view.  ADDENDUM: Mammographically, the mass in the posterior aspect of the LEFT breast measures 0.7 centimeters.   Evaluation of the LEFT axilla is negative for adenopathy.   06/08/2021 Cancer Staging   Staging form: Breast, AJCC 8th  Edition - Clinical stage from 06/08/2021: Stage IA (cT1b, cN0, cM0, G1, ER+, PR+, HER2-) - Signed by Truitt Merle, MD on 06/27/2021 Stage prefix: Initial diagnosis Histologic grading system: 3 grade system    06/08/2021 Initial Biopsy   Diagnosis Breast, left, needle core biopsy - INVASIVE DUCTAL CARCINOMA, GRADE 1. - DUCTAL CARCINOMA IN SITU. - SEE NOTE. Diagnosis Note The greatest tumor dimension is 0.4 cm. The DCIS has low to intermediate nuclear grade.  PROGNOSTIC INDICATORS Results: The tumor cells are NEGATIVE for Her2 (1+). Estrogen Receptor: 100%, POSITIVE, STRONG STAINING INTENSITY Progesterone Receptor: 100%, POSITIVE, STRONG STAINING INTENSITY Proliferation Marker Ki67: <5%    06/25/2021 Initial Diagnosis   Malignant neoplasm of lower-outer quadrant of left breast of female, estrogen receptor positive ([place])       RISK FACTORS:  Colonoscopy: yes;  most recent approximately 8 years ago . Menarche was at age 89.  First live birth at age 73.  Menopausal status: postmenopausal.  OCP use for unknown number of years.   HRT use:  3-4  years.  Past Medical History:  Diagnosis Date   Asthma    COPD (chronic obstructive pulmonary disease) ([place])    Depression    Dizziness    Family history of thyroid cancer 06/29/2021   Hyperlipemia    Hypertension    Hypothyroidism    Prediabetes    SVD (spontaneous vaginal delivery)    x 2   Vitamin D deficiency     Past Surgical History:  Procedure Laterality Date   CATARACT EXTRACTION Right 09/03/2019   Dr. Katy Fitch   DILATATION & CURRETTAGE/HYSTEROSCOPY  WITH RESECTOCOPE N/A 09/10/2013   Procedure: DILATATION & CURETTAGE/HYSTEROSCOPY WITH RESECTOCOPE;  Surgeon: Darlyn Chamber, MD;  Location: [facility];  Service: Gynecology;  Laterality: N/A;   TONSILLECTOMY     TUBAL LIGATION Bilateral 1970   WISDOM TOOTH EXTRACTION      FAMILY HISTORY:  We obtained a detailed, 4-generation family history.  Significant diagnoses are listed  below: Family History  Problem Relation Age of Onset   Thyroid cancer Brother 49   Non-Hodgkin's lymphoma Brother        dx 26s    Ms. Parlow is unaware of previous family history of genetic testing for hereditary cancer risks. There is no reported Ashkenazi Jewish ancestry. There is no known consanguinity.  GENETIC COUNSELING ASSESSMENT: Ms. Koval is a 88 y.o. female with a personal history of breast cancer which is not highly suggestive of a hereditary cancer syndrome. We, therefore, discussed and recommended the following at today's visit.   DISCUSSION: We discussed that, in general, most cancer is not inherited in families, but instead is sporadic or familial. Sporadic cancers occur by chance and typically happen at older ages (>50 years) as this type of cancer is caused by genetic changes acquired during an individual's lifetime. Some families have more cancers than would be expected by chance; however, the ages or types of cancer are not consistent with a known genetic mutation or known genetic mutations have been ruled out. This type of familial cancer is thought to be due to a combination of multiple genetic, environmental, hormonal, and lifestyle factors. While this combination of factors likely increases the risk of cancer, the exact source of this risk is not currently identifiable or testable.    We discussed that approximately 5-10% of cancer is hereditary, meaning that it is due to a mutation in a single gene that is passed down from generation to generation in a family. Most hereditary cases of breast cancer are associated with mutations in BRCA1/2. There are other genes that can be associated an increased risk for breast cancer We discussed that testing can be beneficial for several reasons, including knowing about other cancer risks, identifying potential screening and risk-reduction options that may be appropriate, and to understand if other family members could be at risk for cancer and  allow them to undergo genetic testing.  We discussed with Ms. Strandberg that the personal and family history does not meet NCCN criteria for genetic testing and, therefore, is not highly consistent with a familial hereditary cancer syndrome.  We feel she is at low risk to harbor a gene mutation associated with such a condition. We discussed that hereditary cancer genetic testing is still available if she wishes to proceed.  We discussed that this is reasonable given she does not have any sisters and daughters and thus the family history may be uninformative.   The CancerNext gene panel offered by [employer] includes sequencing, rearrangement analysis, and RNA analysis for the following 36 genes:   APC, ATM, AXIN2, BARD1, BMPR1A, BRCA1, BRCA2, BRIP1, CDH1, CDK4, CDKN2A, CHEK2, DICER1, HOXB13, EPCAM, GREM1, MLH1, MSH2, MSH3, MSH6, MUTYH, NBN, NF1, NTHL1, PALB2, PMS2, POLD1, POLE, PTEN, RAD51C, RAD51D, RECQL, SMAD4, SMARCA4, STK11, and TP53.   PLAN: After considering the risks, benefits, and limitations, Ms. Hargrove provided informed consent to pursue genetic testing and the blood sample was sent to [facility] [place] for analysis of the [place] +RNAinsight. Results should be available within approximately 3 weeks' time, at which point they will be disclosed by telephone to  Ms. Maret, as will any additional recommendations warranted by these results. Ms. Maddux will receive a summary of her genetic counseling visit and a copy of her results once available. This information will also be available in Epic.   Ms. Rhem questions were answered to her satisfaction today. Our contact information was provided should additional questions or concerns arise. Thank you for the referral and allowing [place] to share in the care of your patient.   Rien Marland M. Joette Catching, [place], [facility] Genetic Counselor [email or web address] (P) [phone]  The patient was seen for a total of 10 minutes in face-to-face genetic counseling.  The  patient was accompanied by her daughter in law. Drs. Lindi Adie and/or Burr Medico were available to discuss this case as needed.    _______________________________________________________________________ For Office Staff:  Number of people involved in session: 2 Was an Intern/ student involved with case: no

## 2021-07-03 ENCOUNTER — Telehealth: Payer: Self-pay | Admitting: Hematology

## 2021-07-03 ENCOUNTER — Telehealth: Payer: Self-pay | Admitting: *Deleted

## 2021-07-03 NOTE — Telephone Encounter (Signed)
Scheduled per 5/26 in basket, pt has been called and confirmed

## 2021-07-03 NOTE — Telephone Encounter (Signed)
[id number]: Left message to follow up on the study to see if patient is interested and if she has any questions. Left my phone number and asked patient to return call at her [place], BSN, RN, [place] Nurse II 07/03/2021 2:13 PM .

## 2021-07-04 DIAGNOSIS — Z961 Presence of intraocular lens: Secondary | ICD-10-CM | POA: Diagnosis not present

## 2021-07-05 ENCOUNTER — Telehealth: Payer: Self-pay | Admitting: *Deleted

## 2021-07-05 ENCOUNTER — Encounter: Payer: Self-pay | Admitting: *Deleted

## 2021-07-05 NOTE — Telephone Encounter (Signed)
Spoke with patient to follow up from [facility] 5/24 and assess navigation needs. Patient denies any questions or concerns at this time. Encouraged her to call should anything arise. Patient verbalize understanding.

## 2021-07-09 ENCOUNTER — Telehealth: Payer: Self-pay | Admitting: *Deleted

## 2021-07-09 NOTE — Telephone Encounter (Signed)
[id number]: Left message to follow up on the study to see if patient is interested and if she has any questions. Left my phone number and asked patient to return call at her convenience Foye Spurling, BSN, RN, [place] Nurse II 07/09/2021 1:13 PM .

## 2021-07-09 NOTE — Telephone Encounter (Signed)
[id number]: Patient returned call and says she is interested in participating in this study. She agreed to come into the [facility] on Wednesday 07/11/21 at 12 noon to meet with research nurse for consent and then have lab appointment to draw the research specimens. Thanked patient for her call and look forward to seeing her on Wednesday.  Foye Spurling, BSN, RN, Camera operator II

## 2021-07-10 ENCOUNTER — Encounter: Payer: Self-pay | Admitting: Internal Medicine

## 2021-07-11 ENCOUNTER — Inpatient Hospital Stay: Payer: Medicare Other

## 2021-07-11 ENCOUNTER — Inpatient Hospital Stay: Payer: Medicare Other | Attending: Hematology | Admitting: *Deleted

## 2021-07-11 ENCOUNTER — Other Ambulatory Visit: Payer: Self-pay

## 2021-07-11 DIAGNOSIS — Z006 Encounter for examination for normal comparison and control in clinical research program: Secondary | ICD-10-CM

## 2021-07-11 LAB — RESEARCH LABS

## 2021-07-11 NOTE — Research (Signed)
[employer] 2021-05 - Specimen Collection Study to Evaluate Biomarkers in Subjects with Cancer    This Nurse has reviewed this patient's inclusion and exclusion criteria as a second review and confirms Kathy Whitaker is eligible for study participation.  Patient may continue with enrollment.  Marjie Skiff Brenen Beigel, RN, BSN, [facility] She  Her  [place] Clinical Research Nurse [place] [phone]  Pager [phone] 07/11/2021 12:34 PM

## 2021-07-11 NOTE — Research (Signed)
Trial Name:  [employer] 2021-05 - Specimen Collection Study to Evaluate Biomarkers in Subjects with Cancer    Patient Kathy Whitaker was identified by Dr. Burr Medico as a potential candidate for the above listed study.  This Clinical Research Nurse met with [name], [id number] on 07/11/21 in a manner and location that ensures patient privacy to discuss participation in the above listed research study.  Patient is Unaccompanied.  Patient was previously provided with informed consent documents.  Patient confirmed they have read the informed consent documents.  As outlined in the informed consent form, this Nurse and [name] discussed the purpose of the research study, the investigational nature of the study, study procedures and requirements for study participation, potential risks and benefits of study participation, as well as alternatives to participation.  This study is not blinded or double-blinded. The patient understands participation is voluntary and they may withdraw from study participation at any time.  This study does not involve randomization.  This study does not involve an investigational drug or device. This study does not involve a placebo. Patient understands enrollment is pending full eligibility review.   Confidentiality and how the patient's information will be used as part of study participation were discussed.  Patient was informed there is reimbursement provided for their time and effort spent on trial participation.  The patient is encouraged to discuss research study participation with their insurance provider to determine what costs they may incur as part of study participation, including research related injury.    All questions were answered to patient's satisfaction.  The informed consent with embedded HIPAA language was reviewed page by page.  The patient's mental and emotional status is appropriate to provide informed consent, and the patient verbalizes an understanding  of study participation.  Patient has agreed to participate in the above listed research study and has voluntarily signed the informed consent version IRB Approved 18 Feb 2020, Revised 05 Mar 2021  and embedded HIPPA language page on 07/11/21 at 12:15 PM. .  The patient was provided with a copy of the signed informed consent form with embedded HIPAA language for their reference.  No study specific procedures were obtained prior to the signing of the informed consent document.  Approximately 15 minutes were spent with the patient reviewing the informed consent documents.  Patient was not requested to complete a Release of Information form.   Eligibility: Eligibility criteria reviewed with patient. This Nurse has reviewed this patient's inclusion and exclusion criteria and confirmed patient is eligible for study participation. Eligibility confirmed by treating investigator, who also agrees that patient should proceed with enrollment. Patient will continue with enrollment.  Data Collection:  Medical History:  High Blood Pressure  Yes Coronary Artery Disease No Lupus    No Rheumatoid Arthritis  No Diabetes   No           Lynch Syndrome  No  Is the patient currently taking a magnesium supplement?   No  Does the patient have a personal history of cancer (greater than 5 years ago)?  No  Does the patient have a family history of cancer in 1st or 2nd degree relatives? Yes If yes, Relationship(s) and Cancer type(s)? Brother with Non Hodgkin's Lymphoma and Thyroid Cancer  Does the patient have history of alcohol consumption? Yes   If yes, current or former? Current Number of years? 35 Drinks per week? 3  Does the patient have history of cigarette, cigar, pipe, or chewing tobacco use?  No   Blood Collection: Research blood obtained by [facility] venipuncture. Patient tolerated well without any adverse events.  Gift Card: $50 gift card given to patient for her participation in this study.    Thanked  patient for her participation in this study.   Foye Spurling, BSN, RN, [place] Nurse II 07/11/2021 1:00 PM

## 2021-07-12 ENCOUNTER — Encounter: Payer: Self-pay | Admitting: Genetic Counselor

## 2021-07-12 ENCOUNTER — Telehealth: Payer: Self-pay | Admitting: Genetic Counselor

## 2021-07-12 DIAGNOSIS — Z1379 Encounter for other screening for genetic and chromosomal anomalies: Secondary | ICD-10-CM | POA: Insufficient documentation

## 2021-07-12 NOTE — Telephone Encounter (Signed)
Contacted patient in attempt to disclose results of genetic testing.  Unable to LVM due to full mailbox.

## 2021-07-16 ENCOUNTER — Ambulatory Visit (INDEPENDENT_AMBULATORY_CARE_PROVIDER_SITE_OTHER): Payer: Medicare Other

## 2021-07-16 VITALS — BP 123/72 | HR 55 | Temp 98.0°F | Resp 20 | Ht 60.0 in | Wt 125.4 lb

## 2021-07-16 DIAGNOSIS — J4540 Moderate persistent asthma, uncomplicated: Secondary | ICD-10-CM

## 2021-07-16 MED ORDER — OMALIZUMAB 150 MG/ML ~~LOC~~ SOSY
150.0000 mg | PREFILLED_SYRINGE | Freq: Once | SUBCUTANEOUS | Status: AC
  Administered 2021-07-16: 150 mg via SUBCUTANEOUS

## 2021-07-16 NOTE — Progress Notes (Signed)
Diagnosis: Asthma  Provider:  Marshell Garfinkel, MD  Procedure: Injection  Xolair (Omalizumab), Dose: 150 mg, Site: subcutaneous, Number of injections: 1  Discharge: Condition: Good, Destination: Home . AVS provided to patient.   Performed by:  Adelina Mings, LPN

## 2021-07-17 ENCOUNTER — Encounter (HOSPITAL_BASED_OUTPATIENT_CLINIC_OR_DEPARTMENT_OTHER): Payer: Self-pay | Admitting: General Surgery

## 2021-07-17 ENCOUNTER — Other Ambulatory Visit: Payer: Self-pay

## 2021-07-18 ENCOUNTER — Encounter (HOSPITAL_BASED_OUTPATIENT_CLINIC_OR_DEPARTMENT_OTHER)
Admission: RE | Admit: 2021-07-18 | Discharge: 2021-07-18 | Disposition: A | Discharge status: Home / Self Care | Payer: Medicare Other | Source: Ambulatory Visit | Attending: General Surgery | Admitting: General Surgery

## 2021-07-18 DIAGNOSIS — Z01810 Encounter for preprocedural cardiovascular examination: Secondary | ICD-10-CM | POA: Insufficient documentation

## 2021-07-18 NOTE — Progress Notes (Signed)
Patient was provided with CHG cleanser to use at home before the procedure. Patient verbalized understanding of instructions.       Patient Instructions  The night before surgery:  No food after midnight. ONLY clear liquids after midnight  The day of surgery (if you do NOT have diabetes):  Drink ONE (1) Pre-Surgery Clear Ensure as directed.   This drink was given to you during your hospital  pre-op appointment visit. The pre-op nurse will instruct you on the time to drink the  Pre-Surgery Ensure depending on your surgery time. Finish the drink at the designated time by the pre-op nurse.  Nothing else to drink after completing the  Pre-Surgery Clear Ensure.  The day of surgery (if you have diabetes): Drink ONE (1) Gatorade 2 (G2) as directed. This drink was given to you during your hospital  pre-op appointment visit.  The pre-op nurse will instruct you on the time to drink the   Gatorade 2 (G2) depending on your surgery time. Color of the Gatorade may vary. Red is not allowed. Nothing else to drink after completing the  Gatorade 2 (G2).         If you have questions, please contact your surgeon's office.  

## 2021-07-24 ENCOUNTER — Ambulatory Visit
Admission: RE | Admit: 2021-07-24 | Discharge: 2021-07-24 | Disposition: A | Discharge status: Home / Self Care | Payer: Medicare Other | Source: Ambulatory Visit | Attending: General Surgery | Admitting: General Surgery

## 2021-07-24 DIAGNOSIS — Z170 Estrogen receptor positive status [ER+]: Secondary | ICD-10-CM

## 2021-07-24 DIAGNOSIS — C50912 Malignant neoplasm of unspecified site of left female breast: Secondary | ICD-10-CM | POA: Diagnosis not present

## 2021-07-25 ENCOUNTER — Ambulatory Visit
Admission: RE | Admit: 2021-07-25 | Discharge: 2021-07-25 | Disposition: A | Discharge status: Home / Self Care | Payer: Medicare Other | Source: Ambulatory Visit | Attending: General Surgery | Admitting: General Surgery

## 2021-07-25 ENCOUNTER — Encounter (HOSPITAL_BASED_OUTPATIENT_CLINIC_OR_DEPARTMENT_OTHER): Payer: Self-pay | Admitting: General Surgery

## 2021-07-25 ENCOUNTER — Ambulatory Visit (HOSPITAL_BASED_OUTPATIENT_CLINIC_OR_DEPARTMENT_OTHER): Payer: Medicare Other | Admitting: Certified Registered"

## 2021-07-25 ENCOUNTER — Encounter (HOSPITAL_BASED_OUTPATIENT_CLINIC_OR_DEPARTMENT_OTHER)
Admission: RE | Disposition: A | Discharge status: Home / Self Care | Payer: Self-pay | Source: Home / Self Care | Attending: General Surgery

## 2021-07-25 ENCOUNTER — Other Ambulatory Visit: Payer: Self-pay

## 2021-07-25 ENCOUNTER — Ambulatory Visit (HOSPITAL_BASED_OUTPATIENT_CLINIC_OR_DEPARTMENT_OTHER)
Admission: RE | Admit: 2021-07-25 | Discharge: 2021-07-25 | Disposition: A | Discharge status: Home / Self Care | Payer: Medicare Other | Attending: General Surgery | Admitting: General Surgery

## 2021-07-25 DIAGNOSIS — D0512 Intraductal carcinoma in situ of left breast: Secondary | ICD-10-CM | POA: Diagnosis not present

## 2021-07-25 DIAGNOSIS — R0989 Other specified symptoms and signs involving the circulatory and respiratory systems: Secondary | ICD-10-CM

## 2021-07-25 DIAGNOSIS — E039 Hypothyroidism, unspecified: Secondary | ICD-10-CM | POA: Diagnosis not present

## 2021-07-25 DIAGNOSIS — I10 Essential (primary) hypertension: Secondary | ICD-10-CM | POA: Diagnosis not present

## 2021-07-25 DIAGNOSIS — F32A Depression, unspecified: Secondary | ICD-10-CM | POA: Diagnosis not present

## 2021-07-25 DIAGNOSIS — Z170 Estrogen receptor positive status [ER+]: Secondary | ICD-10-CM

## 2021-07-25 DIAGNOSIS — Z79899 Other long term (current) drug therapy: Secondary | ICD-10-CM | POA: Diagnosis not present

## 2021-07-25 DIAGNOSIS — C50912 Malignant neoplasm of unspecified site of left female breast: Secondary | ICD-10-CM | POA: Diagnosis not present

## 2021-07-25 DIAGNOSIS — R928 Other abnormal and inconclusive findings on diagnostic imaging of breast: Secondary | ICD-10-CM

## 2021-07-25 DIAGNOSIS — J449 Chronic obstructive pulmonary disease, unspecified: Secondary | ICD-10-CM | POA: Insufficient documentation

## 2021-07-25 DIAGNOSIS — K219 Gastro-esophageal reflux disease without esophagitis: Secondary | ICD-10-CM | POA: Insufficient documentation

## 2021-07-25 DIAGNOSIS — C50112 Malignant neoplasm of central portion of left female breast: Secondary | ICD-10-CM | POA: Diagnosis not present

## 2021-07-25 SURGERY — BREAST LUMPECTOMY WITH RADIOACTIVE SEED LOCALIZATION
Anesthesia: General | Site: Breast | Laterality: Left

## 2021-07-25 MED ORDER — PROMETHAZINE HCL 25 MG/ML IJ SOLN: 6.2500 mg | INTRAMUSCULAR | Status: DC | PRN

## 2021-07-25 MED ORDER — DEXAMETHASONE SODIUM PHOSPHATE 10 MG/ML IJ SOLN
INTRAMUSCULAR | Status: AC
  Filled 2021-07-25: qty 1

## 2021-07-25 MED ORDER — ONDANSETRON HCL 4 MG/2ML IJ SOLN
INTRAMUSCULAR | Status: DC | PRN
  Administered 2021-07-25: 4 mg via INTRAVENOUS

## 2021-07-25 MED ORDER — PROPOFOL 10 MG/ML IV BOLUS
INTRAVENOUS | Status: AC
  Filled 2021-07-25: qty 20

## 2021-07-25 MED ORDER — CHLORHEXIDINE GLUCONATE CLOTH 2 % EX PADS: 6.0000 | MEDICATED_PAD | Freq: Once | CUTANEOUS | Status: DC

## 2021-07-25 MED ORDER — GABAPENTIN 100 MG PO CAPS
ORAL_CAPSULE | ORAL | Status: AC
  Filled 2021-07-25: qty 1

## 2021-07-25 MED ORDER — AMISULPRIDE (ANTIEMETIC) 5 MG/2ML IV SOLN: 10.0000 mg | Freq: Once | INTRAVENOUS | Status: DC | PRN

## 2021-07-25 MED ORDER — OXYCODONE HCL 5 MG PO TABS
ORAL_TABLET | ORAL | Status: AC
  Filled 2021-07-25: qty 1

## 2021-07-25 MED ORDER — EPHEDRINE 5 MG/ML INJ
INTRAVENOUS | Status: AC
  Filled 2021-07-25: qty 5

## 2021-07-25 MED ORDER — FENTANYL CITRATE (PF) 100 MCG/2ML IJ SOLN
INTRAMUSCULAR | Status: AC
  Filled 2021-07-25: qty 2

## 2021-07-25 MED ORDER — OXYCODONE HCL 5 MG/5ML PO SOLN: 5.0000 mg | Freq: Once | ORAL | Status: AC | PRN

## 2021-07-25 MED ORDER — BUPIVACAINE-EPINEPHRINE (PF) 0.25% -1:200000 IJ SOLN
INTRAMUSCULAR | Status: DC | PRN
  Administered 2021-07-25: 10 mL

## 2021-07-25 MED ORDER — OXYCODONE HCL 5 MG PO TABS
5.0000 mg | ORAL_TABLET | Freq: Once | ORAL | Status: AC | PRN
  Administered 2021-07-25: 5 mg via ORAL

## 2021-07-25 MED ORDER — GABAPENTIN 100 MG PO CAPS: 100.0000 mg | ORAL_CAPSULE | ORAL | Status: DC

## 2021-07-25 MED ORDER — CEFAZOLIN SODIUM-DEXTROSE 2-4 GM/100ML-% IV SOLN
INTRAVENOUS | Status: AC
  Filled 2021-07-25: qty 100

## 2021-07-25 MED ORDER — LIDOCAINE 2% (20 MG/ML) 5 ML SYRINGE
INTRAMUSCULAR | Status: AC
  Filled 2021-07-25: qty 5

## 2021-07-25 MED ORDER — CEFAZOLIN SODIUM-DEXTROSE 2-4 GM/100ML-% IV SOLN
2.0000 g | INTRAVENOUS | Status: AC
  Administered 2021-07-25: 2 g via INTRAVENOUS

## 2021-07-25 MED ORDER — FENTANYL CITRATE (PF) 100 MCG/2ML IJ SOLN
INTRAMUSCULAR | Status: DC | PRN
  Administered 2021-07-25: 25 ug via INTRAVENOUS

## 2021-07-25 MED ORDER — EPHEDRINE SULFATE (PRESSORS) 50 MG/ML IJ SOLN
INTRAMUSCULAR | Status: DC | PRN
  Administered 2021-07-25 (×2): 5 mg via INTRAVENOUS
  Administered 2021-07-25: 10 mg via INTRAVENOUS

## 2021-07-25 MED ORDER — LACTATED RINGERS IV SOLN: INTRAVENOUS | Status: DC

## 2021-07-25 MED ORDER — DEXAMETHASONE SODIUM PHOSPHATE 4 MG/ML IJ SOLN
INTRAMUSCULAR | Status: DC | PRN
  Administered 2021-07-25: 8 mg via INTRAVENOUS

## 2021-07-25 MED ORDER — ONDANSETRON HCL 4 MG/2ML IJ SOLN
INTRAMUSCULAR | Status: AC
  Filled 2021-07-25: qty 2

## 2021-07-25 MED ORDER — HYDROMORPHONE HCL 1 MG/ML IJ SOLN: 0.2500 mg | INTRAMUSCULAR | Status: DC | PRN

## 2021-07-25 MED ORDER — TRAMADOL HCL 50 MG PO TABS: 50.0000 mg | ORAL_TABLET | Freq: Four times a day (QID) | ORAL | 0 refills | Status: DC | PRN

## 2021-07-25 MED ORDER — PROPOFOL 10 MG/ML IV BOLUS
INTRAVENOUS | Status: DC | PRN
  Administered 2021-07-25: 150 mg via INTRAVENOUS

## 2021-07-25 MED ORDER — LIDOCAINE 2% (20 MG/ML) 5 ML SYRINGE
INTRAMUSCULAR | Status: DC | PRN
  Administered 2021-07-25: 60 mg via INTRAVENOUS

## 2021-07-25 SURGICAL SUPPLY — 42 items
ADH SKN CLS APL DERMABOND .7 (GAUZE/BANDAGES/DRESSINGS) ×1
APL PRP STRL LF DISP 70% ISPRP (MISCELLANEOUS) ×1
APPLIER CLIP 9.375 MED OPEN (MISCELLANEOUS) ×2
APR CLP MED 9.3 20 MLT OPN (MISCELLANEOUS) ×1
BLADE SURG 15 STRL LF DISP TIS (BLADE) ×1 IMPLANT
BLADE SURG 15 STRL SS (BLADE) ×2
CANISTER SUC SOCK COL 7IN (MISCELLANEOUS) ×1 IMPLANT
CANISTER SUCT 1200ML W/VALVE (MISCELLANEOUS) ×1 IMPLANT
CHLORAPREP W/TINT 26 (MISCELLANEOUS) ×2 IMPLANT
CLIP APPLIE 9.375 MED OPEN (MISCELLANEOUS) IMPLANT
COVER BACK TABLE 60X90IN (DRAPES) ×2 IMPLANT
COVER MAYO STAND STRL (DRAPES) ×2 IMPLANT
COVER PROBE W GEL 5X96 (DRAPES) ×2 IMPLANT
DERMABOND ADVANCED (GAUZE/BANDAGES/DRESSINGS) ×1
DERMABOND ADVANCED .7 DNX12 (GAUZE/BANDAGES/DRESSINGS) ×1 IMPLANT
DRAPE LAPAROSCOPIC ABDOMINAL (DRAPES) ×2 IMPLANT
DRAPE UTILITY XL STRL (DRAPES) ×2 IMPLANT
ELECT COATED BLADE 2.86 ST (ELECTRODE) ×2 IMPLANT
ELECT REM PT RETURN 9FT ADLT (ELECTROSURGICAL) ×2
ELECTRODE REM PT RTRN 9FT ADLT (ELECTROSURGICAL) ×1 IMPLANT
GLOVE BIO SURGEON STRL SZ7.5 (GLOVE) ×4 IMPLANT
GOWN STRL REUS W/ TWL LRG LVL3 (GOWN DISPOSABLE) ×2 IMPLANT
GOWN STRL REUS W/TWL LRG LVL3 (GOWN DISPOSABLE) ×4
ILLUMINATOR WAVEGUIDE N/F (MISCELLANEOUS) IMPLANT
KIT MARKER MARGIN INK (KITS) ×2 IMPLANT
LIGHT WAVEGUIDE WIDE FLAT (MISCELLANEOUS) IMPLANT
NDL HYPO 25X1 1.5 SAFETY (NEEDLE) IMPLANT
NEEDLE HYPO 25X1 1.5 SAFETY (NEEDLE) ×2 IMPLANT
NS IRRIG 1000ML POUR BTL (IV SOLUTION) IMPLANT
PACK BASIN DAY SURGERY FS (CUSTOM PROCEDURE TRAY) ×2 IMPLANT
PENCIL SMOKE EVACUATOR (MISCELLANEOUS) ×2 IMPLANT
SLEEVE SCD COMPRESS KNEE MED (STOCKING) ×2 IMPLANT
SPIKE FLUID TRANSFER (MISCELLANEOUS) IMPLANT
SPONGE T-LAP 18X18 ~~LOC~~+RFID (SPONGE) ×2 IMPLANT
SUT MON AB 4-0 PC3 18 (SUTURE) ×2 IMPLANT
SUT SILK 2 0 SH (SUTURE) IMPLANT
SUT VICRYL 3-0 CR8 SH (SUTURE) ×2 IMPLANT
SYR CONTROL 10ML LL (SYRINGE) ×1 IMPLANT
TOWEL GREEN STERILE FF (TOWEL DISPOSABLE) ×2 IMPLANT
TRAY FAXITRON CT DISP (TRAY / TRAY PROCEDURE) ×2 IMPLANT
TUBE CONNECTING 20X1/4 (TUBING) ×1 IMPLANT
YANKAUER SUCT BULB TIP NO VENT (SUCTIONS) IMPLANT

## 2021-07-25 NOTE — Transfer of Care (Signed)
Immediate Anesthesia Transfer of Care Note  Patient: Kathy Whitaker  Procedure(s) Performed: LEFT BREAST LUMPECTOMY WITH RADIOACTIVE SEED LOCALIZATION (Left: Breast)  Patient Location: PACU  Anesthesia Type:General  Level of Consciousness: drowsy  Airway & Oxygen Therapy: Patient Spontanous Breathing and Patient connected to face mask oxygen  Post-op Assessment: Report given to RN and Post -op Vital signs reviewed and stable  Post vital signs: Reviewed and stable  Last Vitals:  Vitals Value Taken Time  BP 118/58 07/25/21 1240  Temp    Pulse 60 07/25/21 1242  Resp 20 07/25/21 1242  SpO2 98 % 07/25/21 1242  Vitals shown include unvalidated device data.  Last Pain:  Vitals:   07/25/21 1054  TempSrc: Oral  PainSc: 0-No pain      Patients Stated Pain Goal: 6 (69/24/93 2419)  Complications: No notable events documented.

## 2021-07-25 NOTE — Anesthesia Postprocedure Evaluation (Signed)
Anesthesia Post Note  Patient: Kathy Whitaker  Procedure(s) Performed: LEFT BREAST LUMPECTOMY WITH RADIOACTIVE SEED LOCALIZATION (Left: Breast)     Patient location during evaluation: PACU Anesthesia Type: General Level of consciousness: awake and alert Pain management: pain level controlled Vital Signs Assessment: post-procedure vital signs reviewed and stable Respiratory status: spontaneous breathing, nonlabored ventilation and respiratory function stable Cardiovascular status: blood pressure returned to baseline and stable Postop Assessment: no apparent nausea or vomiting Anesthetic complications: no   No notable events documented.  Last Vitals:  Vitals:   07/25/21 1345 07/25/21 1416  BP: 138/64 (!) 146/61  Pulse: 62 60  Resp: 18 18  Temp:  36.4 C  SpO2: 94% 95%    Last Pain:  Vitals:   07/25/21 1400  TempSrc:   PainSc: [place]

## 2021-07-25 NOTE — Interval H&P Note (Signed)
History and Physical Interval Note:  07/25/2021 11:15 AM  Kathy Whitaker  has presented today for surgery, with the diagnosis of LEFT BREAST CANCER.  The various methods of treatment have been discussed with the patient and family. After consideration of risks, benefits and other options for treatment, the patient has consented to  Procedure(s): LEFT BREAST LUMPECTOMY WITH RADIOACTIVE SEED LOCALIZATION (Left) as a surgical intervention.  The patient's history has been reviewed, patient examined, no change in status, stable for surgery.  I have reviewed the patient's chart and labs.  Questions were answered to the patient's satisfaction.     Autumn Messing III

## 2021-07-25 NOTE — Anesthesia Preprocedure Evaluation (Signed)
Anesthesia Evaluation    Airway Mallampati: II  TM Distance: >3 FB Neck ROM: Full    Dental no notable dental hx.    Pulmonary asthma , COPD,    Pulmonary exam normal breath sounds clear to auscultation       Cardiovascular hypertension, Pt. on medications Normal cardiovascular exam Rhythm:Regular Rate:Normal     Neuro/Psych Depression    GI/Hepatic GERD  ,  Endo/Other  Hypothyroidism   Renal/GU      Musculoskeletal   Abdominal   Peds  Hematology   Anesthesia Other Findings Breast Cancer  Reproductive/Obstetrics                             Anesthesia Physical Anesthesia Plan  ASA: 3  Anesthesia Plan: General   Post-op Pain Management: Dilaudid IV   Induction: Intravenous  PONV Risk Score and Plan: 3 and Ondansetron, Dexamethasone, Midazolam and Treatment may vary due to age or medical condition  Airway Management Planned: LMA  Additional Equipment:   Intra-op Plan:   Post-operative Plan: Extubation in OR  Informed Consent: I have reviewed the patients History and Physical, chart, labs and discussed the procedure including the risks, benefits and alternatives for the proposed anesthesia with the patient or authorized representative who has indicated his/her understanding and acceptance.     Dental advisory given  Plan Discussed with: CRNA  Anesthesia Plan Comments:         Anesthesia Quick Evaluation

## 2021-07-25 NOTE — Discharge Instructions (Signed)

## 2021-07-25 NOTE — H&P (Signed)
PROVIDER: Landry Corporal, MD  MRN: [id number] DOB: 10/20/1933 Subjective   Chief Complaint: Breast Cancer   History of Present Illness: Kathy Whitaker is a 88 y.o. female who is seen today as an office consultation for evaluation of Breast Cancer .   We are asked to see the patient in consultation by Dr. Burr Medico to evaluate her for a new left breast cancer. The patient is a 88 year old white female who recently went for a routine screening mammogram. At that time she was found to have an asymmetry in the posterior central left breast that measured 7 mm. The axilla looked normal. The asymmetry was biopsied and came back as a grade 1 invasive ductal cancer that was ER and PR positive and HER2 negative with a Ki-67 less than 5%. She is otherwise in good health. She does not smoke  Review of Systems: A complete review of systems was obtained from the patient. I have reviewed this information and discussed as appropriate with the patient. See HPI as well for other ROS.  ROS   Medical History: Past Medical History:  Diagnosis Date   Anxiety   Asthma, unspecified asthma severity, unspecified whether complicated, unspecified whether persistent   History of cancer   Hypertension   Thyroid disease   Patient Active Problem List  Diagnosis   Hypothyroidism   Labile hypertension   Malignant neoplasm of lower-outer quadrant of left breast of female, estrogen receptor positive (CMS-HCC)   Malignant neoplasm of central portion of left breast in female, estrogen receptor positive (CMS-HCC)   Past Surgical History:  Procedure Laterality Date   CATARACT EXTRACTION Right 09/03/2019    Allergies  Allergen Reactions   Codeine Nausea and Vomiting   Propoxyphene Nausea and Vomiting   Shellfish Containing Products Rash   Shellfish Derived Hives and Rash   Current Outpatient Medications on File Prior to Visit  Medication Sig Dispense Refill   bisoproloL-hydroCHLOROthiazide (ZIAC) 5-6.25 mg tablet    budesonide (PULMICORT) 0.25 mg/2 mL nebulizer solution budesonide 0.25 mg/2 mL suspension for nebulization Inhale 2 mL twice a day by nebulization route.   dicyclomine (BENTYL) 20 mg tablet dicyclomine 20 mg tablet TAKE 1/2 TO 1 TABLET BY MOUTH FOUR TIMES DAILY BEFORE MEALS AND AT BEDTIME FOR ABDOMINAL CRAMPS OR BLOATING OR NAUSEA   formoterol (PERFOROMIST) 20 mcg/2 mL nebulizer solution formoterol fumarate 20 mcg/2 mL solution for nebulization Inhale 2 mL twice a day by inhalation route.   levothyroxine (SYNTHROID) 75 MCG tablet   ascorbic acid (ASCOR IV) ascorbic acid (vitamin C)   aspirin 81 MG EC tablet Take 81 mg by mouth once daily   cholecalciferol, vitamin D3, (CHOLECALCIFEROL, VIT D3,,BULK,) 100,000 unit/gram Powd Take by mouth   EPINEPHrine (EPIPEN JR) 0.15 mg/0.3 mL auto-injector epinephrine (Jr) 0.15 mg/0.3 mL injection,auto-injector Take by injection route.   ipratropium-albuteroL (COMBIVENT RESPIMAT) 20-100 mcg/actuation inhaler every 6 (six) hours   loratadine (CLARITIN) 10 mg tablet daily   meclizine (ANTIVERT) 25 mg tablet meclizine 25 mg tablet TAKE 1/2 TO 1 TABLET 3X A DAY AS NEEDED FOR DIZZINESS   sertraline (ZOLOFT) 50 MG tablet TAKE 1 TABLET BY MOUTH DAILY FOR MOOD   triamcinolone 0.1 % ointment triamcinolone acetonide 0.1 % topical ointment APPLY TOPICALLY TO THE AFFECTED AREA TWICE DAILY   zinc gluconate 50 mg tablet daily   No current facility-administered medications on file prior to visit.   Family History  Problem Relation Age of Onset   High blood pressure (Hypertension) Mother   Hyperlipidemia (Elevated  cholesterol) Mother   Diabetes Father    Social History   Tobacco Use  Smoking Status Never  Smokeless Tobacco Never    Social History   Socioeconomic History   Marital status: Widowed  Tobacco Use   Smoking status: Never   Smokeless tobacco: Never  Vaping Use   Vaping Use: Never used  Substance and Sexual Activity   Alcohol use: Yes   Comment: "occasionally"   Drug use: Never   Objective:  There were no vitals filed for this visit.  There is no height or weight on file to calculate BMI.  Physical Exam Vitals reviewed.  Constitutional:  General: She is not in acute distress. Appearance: Normal appearance.  HENT:  Head: Normocephalic and atraumatic.  Right Ear: External ear normal.  Left Ear: External ear normal.  Nose: Nose normal.  Mouth/Throat:  Mouth: Mucous membranes are moist.  Pharynx: Oropharynx is clear.  Eyes:  General: No scleral icterus. Extraocular Movements: Extraocular movements intact.  Conjunctiva/sclera: Conjunctivae normal.  Pupils: Pupils are equal, round, and reactive to light.  Cardiovascular:  Rate and Rhythm: Normal rate and regular rhythm.  Pulses: Normal pulses.  Heart sounds: Normal heart sounds.  Pulmonary:  Effort: Pulmonary effort is normal. No respiratory distress.  Breath sounds: Normal breath sounds.  Abdominal:  General: Bowel sounds are normal.  Palpations: Abdomen is soft.  Tenderness: There is no abdominal tenderness.  Musculoskeletal:  General: No swelling, tenderness or deformity. Normal range of motion.  Cervical back: Normal range of motion and neck supple.  Skin: General: Skin is warm and dry.  Coloration: Skin is not jaundiced.  Neurological:  General: No focal deficit present.  Mental Status: She is alert and oriented to person, place, and time.  Psychiatric:  Mood and Affect: Mood normal.  Behavior: Behavior normal.     Breast: There is no palpable mass in either breast other than a moderate sized hematoma at the biopsy site. There is no palpable axillary, supraclavicular, or cervical lymphadenopathy.  Labs, Imaging and Diagnostic Testing:  Assessment and Plan:   Diagnoses and all orders for this visit:  Malignant neoplasm of central portion of left breast in female, estrogen receptor positive (CMS-HCC)    The patient appears to have a  small 7 mm cancer in the central portion of the left breast with clinically negative nodes. I have discussed with her in detail the different options for treatment and at this point she favors breast conservation which I feel is very reasonable. Given her age and the low-grade nature of the cancer she will not need a node evaluation. She will meet with medical and radiation oncology to discuss adjuvant therapy. I have discussed with her in detail the risks and benefits of the operation as well as some of the technical aspects including the use of a radioactive seed for localization and she understands and wishes to proceed.

## 2021-07-25 NOTE — Anesthesia Procedure Notes (Signed)
Procedure Name: LMA Insertion Date/Time: 07/25/2021 11:52 AM  Performed by: Ezequiel Kayser, CRNAPre-anesthesia Checklist: Patient identified, Emergency Drugs available, Suction available and Patient being monitored Patient Re-evaluated:Patient Re-evaluated prior to induction Oxygen Delivery Method: Circle System Utilized Preoxygenation: Pre-oxygenation with 100% oxygen Induction Type: IV induction Ventilation: Mask ventilation without difficulty LMA: LMA inserted LMA Size: 3.0 Number of attempts: 1 Airway Equipment and Method: Bite block Placement Confirmation: positive ETCO2 Tube secured with: Tape Dental Injury: Teeth and Oropharynx as per pre-operative assessment

## 2021-07-25 NOTE — Op Note (Signed)
07/25/2021  12:34 PM  PATIENT:  Kathy Whitaker  88 y.o. female  PRE-OPERATIVE DIAGNOSIS:  LEFT BREAST CANCER  POST-OPERATIVE DIAGNOSIS:  LEFT BREAST CANCER  PROCEDURE:  Procedure(s): LEFT BREAST LUMPECTOMY WITH RADIOACTIVE SEED LOCALIZATION (Left)  SURGEON:  Surgeon(s) and Role:    * Jovita Kussmaul, MD - Primary  PHYSICIAN ASSISTANT:   ASSISTANTS: none   ANESTHESIA:   local and general  EBL:  minimal   BLOOD ADMINISTERED:none  DRAINS: none   LOCAL MEDICATIONS USED:  MARCAINE     SPECIMEN:  Source of Specimen:  left breast tissue  DISPOSITION OF SPECIMEN:  PATHOLOGY  COUNTS:  YES  TOURNIQUET:  * No tourniquets in log *  DICTATION: .Dragon Dictation  After informed consent was obtained the patient was brought to the operating room and placed in the supine position on the operating table.  After adequate induction of general anesthesia the patient's left breast was prepped with ChloraPrep, allowed to dry, and draped in usual sterile manner.  An appropriate timeout was performed.  Previously an I-125 seed was placed in the lower outer left breast to mark an area of invasive breast cancer.  The neoprobe was set to I-125 in the area of radioactivity was readily identified.  The area around this was infiltrated with quarter percent Marcaine.  A curvilinear incision was then made along the inframammary fold closest to where the cancer was with a 15 blade knife.  The incision was carried through the skin and subcutaneous tissue sharply with the electrocautery.  Dissection was then carried all the way to the chest wall.  Dissection was then carried from here superiorly between the breast tissue and the muscle of the chest wall.  Once the dissection was well beyond the area of the cancer then the dissection was carried out anteriorly between the breast tissue and the subcutaneous fat and skin.  Once this dissection was also well beyond the area of the cancer I then removed a circular  portion of breast tissue sharply with the electrocautery around the radioactive seed while checking the area of radioactivity frequently.  Once the specimen was removed it was oriented with the appropriate paint colors.  A specimen radiograph was obtained that showed the clip and seed to be near the center of the specimen.  The specimen was then sent to pathology for further evaluation.  Hemostasis was achieved using the Bovie electrocautery.  The wound was irrigated with saline and infiltrated with more quarter percent Marcaine.  The cavity was marked with clips.  The deep layer of the incision was then closed with layers of interrupted 3-0 Vicryl stitches.  The skin was closed with a running 4-0 Monocryl subcuticular stitch.  Dermabond dressings were applied.  The patient tolerated the procedure well.  At the end of the case all needle sponge and instrument counts were correct.  The patient was then awakened and taken to recovery in stable condition.  PLAN OF CARE: Discharge to home after PACU  PATIENT DISPOSITION:  PACU - hemodynamically stable.   Delay start of Pharmacological VTE agent (>24hrs) due to surgical blood loss or risk of bleeding: not applicable

## 2021-07-26 ENCOUNTER — Encounter (HOSPITAL_BASED_OUTPATIENT_CLINIC_OR_DEPARTMENT_OTHER): Payer: Self-pay | Admitting: General Surgery

## 2021-07-30 ENCOUNTER — Encounter: Payer: Self-pay | Admitting: *Deleted

## 2021-07-30 LAB — SURGICAL PATHOLOGY

## 2021-08-03 ENCOUNTER — Encounter (HOSPITAL_COMMUNITY): Payer: Self-pay

## 2021-08-10 ENCOUNTER — Ambulatory Visit: Payer: Medicare Other | Admitting: Internal Medicine

## 2021-08-12 NOTE — Progress Notes (Unsigned)
[place]   [phone] [phone]   Clinic Follow up Note   Patient Care Team: [name], MD as PCP - General (Internal Medicine) Arvella Nigh, MD as Consulting Physician (Obstetrics and Gynecology) Mauro Kaufmann, RN as Oncology Nurse Navigator Rockwell Germany, RN as Oncology Nurse Navigator Jovita Kussmaul, MD as Consulting Physician (General Surgery) Truitt Merle, MD as Consulting Physician (Hematology) Kyung Rudd, MD as Consulting Physician (Radiation Oncology) 08/12/2021  CHIEF COMPLAINT: f/u left breast cancer   ASSESSMENT & PLAN:  Kathy Whitaker is a 88 y.o. post-menopausal female with a history of HTN, hypothyroidism   1. Malignant neoplasm of lower-outer quadrant of left breast, IDC, Stage IA, p(T1a, N0), ER+/PR+/HER2-, Grade 1 -found on screening mammogram. Left MM and [place] on 05/30/21 s -She underwent left lumpectomy which showed a 4 mm tubular adenocarcinoma, with clear margins. -Giving the strong ER and PR expression in her postmenopausal status, I recommend adjuvant endocrine therapy with tamoxifen or aromatase inhibitor for a total of 5 years to reduce the risk of cancer recurrence. Potential benefits and side effects were discussed with patient and she is interested.  Due to her arthritis and osteopenia, tamoxifen is probably better.  Given her advanced age, if she does have side effect, I have low threshold to stop.   ---The potential side effects, which includes but not limited to, hot flash, skin and vaginal dryness, slightly increased risk of cardiovascular disease and cataract, small risk of thrombosis and endometrial cancer, were discussed with her in great details. Preventive strategies for thrombosis, such as being physically active, using compression stocks, avoid cigarette smoking, etc., were reviewed with her. I also recommend her to follow-up with her gynecologist once a year, and watch for vaginal spotting or bleeding, as a  clinically sign of endometrial cancer, etc. She voiced good understanding, and agrees to proceed. Will start after she completes adjuvant breast radiation.      PLAN:     SUMMARY OF ONCOLOGIC HISTORY: Oncology History Overview Note   Cancer Staging  Malignant neoplasm of lower-outer quadrant of left breast of female, estrogen receptor positive ([place]) Staging form: Breast, AJCC 8th Edition - Clinical stage from 06/08/2021: Stage IA (cT1b, cN0, cM0, G1, ER+, PR+, HER2-) - Signed by Truitt Merle, MD on 06/27/2021    Extrinsic asthma  Malignant neoplasm of lower-outer quadrant of left breast of female, estrogen receptor positive ([place])  05/30/2021 Mammogram   CLINICAL DATA:  The patient was called back for a left breast mass.   EXAM: DIGITAL DIAGNOSTIC UNILATERAL LEFT MAMMOGRAM WITH TOMOSYNTHESIS AND CAD; ULTRASOUND LEFT BREAST LIMITED  IMPRESSION: Indeterminate left breast mass only identified on the cc view.  ADDENDUM: Mammographically, the mass in the posterior aspect of the LEFT breast measures 0.7 centimeters.   Evaluation of the LEFT axilla is negative for adenopathy.   06/08/2021 Cancer Staging   Staging form: Breast, AJCC 8th Edition - Clinical stage from 06/08/2021: Stage IA (cT1b, cN0, cM0, G1, ER+, PR+, HER2-) - Signed by Truitt Merle, MD on 06/27/2021 Stage prefix: Initial diagnosis Histologic grading system: 3 grade system   06/08/2021 Initial Biopsy   Diagnosis Breast, left, needle core biopsy - INVASIVE DUCTAL CARCINOMA, GRADE 1. - DUCTAL CARCINOMA IN SITU. - SEE NOTE. Diagnosis Note The greatest tumor dimension is 0.4 cm. The DCIS has low to intermediate nuclear grade.  PROGNOSTIC INDICATORS Results: The tumor cells are NEGATIVE for Her2 (1+). Estrogen Receptor: 100%, POSITIVE, STRONG STAINING INTENSITY Progesterone Receptor: 100%,  POSITIVE, STRONG STAINING INTENSITY Proliferation Marker Ki67: <5%    06/25/2021 Initial Diagnosis   Malignant neoplasm of lower-outer  quadrant of left breast of female, estrogen receptor positive ([place])   07/11/2021 Genetic Testing   Negative hereditary cancer genetic testing: no pathogenic variants detected in [facility] CancerNext+RNAinsight Panel.  Report date is July 11, 2021.   The CancerNext gene panel offered by [facility] [employer] includes sequencing, rearrangement analysis, and RNA analysis for the following 36 genes:   APC, ATM, AXIN2, BARD1, BMPR1A, BRCA1, BRCA2, BRIP1, CDH1, CDK4, CDKN2A, CHEK2, DICER1, HOXB13, EPCAM, GREM1, MLH1, MSH2, MSH3, MSH6, MUTYH, NBN, NF1, NTHL1, PALB2, PMS2, POLD1, POLE, PTEN, RAD51C, RAD51D, RECQL, SMAD4, SMARCA4, STK11, and TP53.    07/25/2021 Cancer Staging   Staging form: Breast, AJCC 8th Edition - Pathologic stage from 07/25/2021: Stage Unknown (pT1a, pNX, cM0, G1, ER+, PR+, HER2-) - Signed by Truitt Merle, MD on 08/12/2021 Stage prefix: Initial diagnosis Histologic grading system: 3 grade system     CURRENT THERAPY:   INTERVAL HISTORY:   REVIEW OF SYSTEMS:   Constitutional: Denies fevers, chills or abnormal weight loss Eyes: Denies blurriness of vision Ears, nose, mouth, throat, and face: Denies mucositis or sore throat Respiratory: Denies cough, dyspnea or wheezes Cardiovascular: Denies palpitation, chest discomfort or lower extremity swelling Gastrointestinal:  Denies nausea, heartburn or change in bowel habits Skin: Denies abnormal skin rashes Lymphatics: Denies new lymphadenopathy or easy bruising Neurological:Denies numbness, tingling or new weaknesses Behavioral/Psych: Mood is stable, no new changes  All other systems were reviewed with the patient and are negative.  MEDICAL HISTORY:  Past Medical History:  Diagnosis Date   Asthma    Cancer ([place]) 06/2021   left breast IDC   COPD (chronic obstructive pulmonary disease) (HCC)    Depression    Dizziness    Family history of thyroid cancer 06/29/2021   GERD (gastroesophageal reflux disease)    Hyperlipemia    Hypertension     Hypothyroidism    Prediabetes    SVD (spontaneous vaginal delivery)    x 2   Vitamin D deficiency     SURGICAL HISTORY: Past Surgical History:  Procedure Laterality Date   BREAST LUMPECTOMY WITH RADIOACTIVE SEED LOCALIZATION Left 07/25/2021   Procedure: LEFT BREAST LUMPECTOMY WITH RADIOACTIVE SEED LOCALIZATION;  Surgeon: Jovita Kussmaul, MD;  Location: [facility];  Service: General;  Laterality: Left;   CATARACT EXTRACTION Right 09/03/2019   Dr. Katy Fitch   DILATATION & CURRETTAGE/HYSTEROSCOPY WITH RESECTOCOPE N/A 09/10/2013   Procedure: DILATATION & CURETTAGE/HYSTEROSCOPY WITH RESECTOCOPE;  Surgeon: Darlyn Chamber, MD;  Location: [facility];  Service: Gynecology;  Laterality: N/A;   TONSILLECTOMY     TUBAL LIGATION Bilateral 1970   WISDOM TOOTH EXTRACTION      I have reviewed the social history and family history with the patient and they are unchanged from previous note.  ALLERGIES:  is allergic to codeine, propoxyphene n-acetaminophen, and shellfish-derived products.  MEDICATIONS:  Current Outpatient Medications  Medication Sig Dispense Refill   Ascorbic Acid (VITAMIN C PO) Take 500 mg by mouth daily.     aspirin EC 81 MG tablet Take 81 mg by mouth daily.     bisoprolol-hydrochlorothiazide (ZIAC) 5-6.25 MG tablet Take  1 tablet  Daily  for BP 90 tablet 3   budesonide (PULMICORT) 0.25 MG/2ML nebulizer solution USE 1 VIAL  IN  NEBULIZER TWICE  DAILY - rinse mouth after treatment 120 mL 11   Cholecalciferol (VITAMIN D PO) Take 5,000 Units by mouth  daily.     dicyclomine (BENTYL) 20 MG tablet Take 1/2 to 1 tablet 4 x /day before Meals & Bedtime for Abdominal Cramping , Bloating or Nausea 360 tablet 0   formoterol (PERFOROMIST) 20 MCG/2ML nebulizer solution USE 1 VIAL  IN  NEBULIZER TWICE  DAILY - morning and evening 120 mL 11   Ipratropium-Albuterol (COMBIVENT RESPIMAT) 20-100 MCG/ACT AERS respimat USE 1 INHALATION EVERY 6 HOURS AS NEEDED FOR WHEEZING 12 g 0   levothyroxine  (SYNTHROID) 75 MCG tablet TAKE AS INSTRUCTED BY YOUR PRESCRIBER 90 tablet 3   loratadine (CLARITIN) 10 MG tablet Take 1 tablet (10 mg total) by mouth daily as needed for allergies. 30 tablet 1   meclizine (ANTIVERT) 25 MG tablet Take 1/2 to 1 tablet 3 x /day as needed for dizziness / Vertigo 90 tablet 11   omeprazole (PRILOSEC) 40 MG capsule Take  1 capsule  Daily  to Prevent Heartburn & Indigestion 90 capsule 3   sertraline (ZOLOFT) 50 MG tablet Take  1 tablet  Daily  for mood. 90 tablet 3   traMADol (ULTRAM) 50 MG tablet Take 1 tablet (50 mg total) by mouth every 6 (six) hours as needed. 15 tablet 0   vitamin E 600 UNIT capsule Take 600 Units by mouth daily.     zinc gluconate 50 MG tablet Take 50 mg by mouth daily.     Current Facility-Administered Medications  Medication Dose Route Frequency Provider Last Rate Last Admin   omalizumab Arvid Right) injection 150 mg  150 mg Subcutaneous Q14 Days [name], MD   150 mg at 01/02/18 0815   omalizumab Arvid Right) injection 150 mg  150 mg Subcutaneous Q14 Days [name], MD   150 mg at 03/19/18 1549    PHYSICAL EXAMINATION: ECOG PERFORMANCE STATUS: {CHL ONC ECOG [phone]}  There were no vitals filed for this visit. There were no vitals filed for this visit.  GENERAL:alert, no distress and comfortable SKIN: skin color, texture, turgor are normal, no rashes or significant lesions EYES: normal, Conjunctiva are pink and non-injected, sclera clear OROPHARYNX:no exudate, no erythema and lips, buccal mucosa, and tongue normal  NECK: supple, thyroid normal size, non-tender, without nodularity LYMPH:  no palpable lymphadenopathy in the cervical, axillary or inguinal LUNGS: clear to auscultation and percussion with normal breathing effort HEART: regular rate & rhythm and no murmurs and no lower extremity edema ABDOMEN:abdomen soft, non-tender and normal bowel sounds Musculoskeletal:no cyanosis of digits and no clubbing  NEURO: alert &  oriented x 3 with fluent speech, no focal motor/sensory deficits  LABORATORY DATA:  I have reviewed the data as listed    Latest Ref Rng & Units 06/27/2021   12:21 PM 05/15/2021   12:00 PM 05/03/2021    2:51 PM  CBC  WBC 4.0 - 10.5 K/uL 5.7  6.1  7.9   Hemoglobin 12.0 - 15.0 g/dL 12.0  13.6  12.5   Hematocrit 36.0 - 46.0 % 36.4  41.5  38.3   Platelets 150 - 400 K/uL 253  287  274         Latest Ref Rng & Units 06/27/2021   12:21 PM 05/15/2021   12:00 PM 05/03/2021    2:51 PM  CMP  Glucose 70 - 99 mg/dL 116  89  120   BUN 8 - 23 mg/dL '18  23  16   ' Creatinine 0.44 - 1.00 mg/dL 0.85  0.71  0.83   Sodium 135 - 145 mmol/L 136  139  137   Potassium 3.5 - 5.1 mmol/L 4.4  4.9  4.4   Chloride 98 - 111 mmol/L 100  100  99   CO2 22 - 32 mmol/L 32  25  28   Calcium 8.9 - 10.3 mg/dL 9.7  10.3  9.6   Total Protein 6.5 - 8.1 g/dL 7.1  7.1  6.7   Total Bilirubin 0.3 - 1.2 mg/dL 0.4  0.6  0.4   Alkaline Phos 38 - 126 U/L 85     AST 15 - 41 U/L '18  19  15   ' ALT 0 - 44 U/L '12  11  9       ' RADIOGRAPHIC STUDIES: I have personally reviewed the radiological images as listed and agreed with the findings in the report. No results found.   ASSESSMENT & PLAN:  No problem-specific Assessment & Plan notes found for this encounter.   No orders of the defined types were placed in this encounter.  All questions were answered. The patient knows to call the clinic with any problems, questions or concerns. No barriers to learning was detected. I spent {CHL ONC TIME VISIT - FYBOF:[id number]} counseling the patient face to face. The total time spent in the appointment was {CHL ONC TIME VISIT - POEUM:[id number]} and more than 50% was on counseling and review of test results     Truitt Merle, MD 08/12/21

## 2021-08-13 ENCOUNTER — Other Ambulatory Visit: Payer: Self-pay

## 2021-08-13 ENCOUNTER — Encounter: Payer: Self-pay | Admitting: Hematology

## 2021-08-13 ENCOUNTER — Inpatient Hospital Stay: Payer: Medicare Other | Attending: Hematology | Admitting: Hematology

## 2021-08-13 VITALS — BP 113/69 | HR 64 | Temp 98.0°F | Resp 16 | Ht 60.0 in | Wt 122.5 lb

## 2021-08-13 DIAGNOSIS — E039 Hypothyroidism, unspecified: Secondary | ICD-10-CM | POA: Diagnosis not present

## 2021-08-13 DIAGNOSIS — M8580 Other specified disorders of bone density and structure, unspecified site: Secondary | ICD-10-CM | POA: Diagnosis not present

## 2021-08-13 DIAGNOSIS — Z170 Estrogen receptor positive status [ER+]: Secondary | ICD-10-CM | POA: Diagnosis not present

## 2021-08-13 DIAGNOSIS — I10 Essential (primary) hypertension: Secondary | ICD-10-CM | POA: Insufficient documentation

## 2021-08-13 DIAGNOSIS — M1990 Unspecified osteoarthritis, unspecified site: Secondary | ICD-10-CM | POA: Diagnosis not present

## 2021-08-13 DIAGNOSIS — C50512 Malignant neoplasm of lower-outer quadrant of left female breast: Secondary | ICD-10-CM | POA: Diagnosis not present

## 2021-08-14 ENCOUNTER — Encounter: Payer: Self-pay | Admitting: *Deleted

## 2021-08-14 DIAGNOSIS — Z170 Estrogen receptor positive status [ER+]: Secondary | ICD-10-CM

## 2021-08-15 ENCOUNTER — Ambulatory Visit (INDEPENDENT_AMBULATORY_CARE_PROVIDER_SITE_OTHER): Payer: Medicare Other | Admitting: Internal Medicine

## 2021-08-15 ENCOUNTER — Encounter: Payer: Self-pay | Admitting: Internal Medicine

## 2021-08-15 DIAGNOSIS — J4540 Moderate persistent asthma, uncomplicated: Secondary | ICD-10-CM | POA: Diagnosis not present

## 2021-08-15 NOTE — Progress Notes (Signed)
Subjective:     Patient ID: Kathy Whitaker, female   DOB: Jul 16, 1933   MRN: [id number]    Brief patient profile:  56  yowf never smoker with h/o allergic rhinitis/ moderate chronic asthma dating back to  Her late 87s and much better on xolair/performist/budesonide previously under Dr Bettina Gavia care  - on Goltry since around 2009 / reduced to monthly rx 04/2015 by Dr Annamaria Boots      History of Present Illness 05/02/2015 1st   office visit/ [name] / transition of care from Dr Joya Gaskins  Chief Complaint  Patient presents with   Follow-up    Former PW pt. Pt c/o occasional SOB with exertion. Pt states that her breathing is doing well. She recently had a cold but is improving. Pt denies cough/wheeze/CP/tightness. Pt is on Xolair and reports no complications/reactions.   maint rx with performist/budesonide rarely need combivent (very poor hfa noted - see a/p) Says other forms of ICS don't work. Concerned about longterm effects of meds  rec Combine your nebulizer solutions and take each am plus second treatment in pm if needed. Work on Doctor, hospital.   08/10/2020  f/u ov/Maryagnes Carrasco re: asthma/ rhinitis on brovana / bud maint one daily  rare saba  Chief Complaint  Patient presents with   Follow-up    Breathing is overall doing well. She has occ cough and wheeze but has not bothered her in about a month. She rarely uses her combivent inhaler.    Dyspnea:  MMRC1 = can walk nl pace, flat grade, can't hurry or go uphills or steps s sob   Cough: resolved one month prior to OV  with neg covid testing Sleeping: flat bed / one pillow SABA use: rarely  02: none Covid status:  Vax x 3  Rec Work on inhaler technique:    No change medications If you want the MDI (old fashioned) I'll write you a new prescription and ask you to bring it in    08/15/2021  f/u ov/Bertel Venard re: asthma/ rhinitis maint on xolair/ performist/bud    Chief Complaint  Patient presents with   Follow-up    Sob- better, cough dry occass.  Dyspnea:   MMRC1 = can walk nl pace, flat grade, can't hurry or go uphills or steps s sob   Cough: minimal dry  Sleeping: flat bed / one pillow  SABA use: rarely 02: none      No obvious day to day or daytime variability or assoc excess/ purulent sputum or mucus plugs or hemoptysis or cp or chest tightness, subjective wheeze or overt sinus or hb symptoms.   Sleeping  without nocturnal  or early am exacerbation  of respiratory  c/o's or need for noct saba. Also denies any obvious fluctuation of symptoms with weather or environmental changes or other aggravating or alleviating factors except as outlined above   No unusual exposure hx or h/o childhood pna/ asthma or knowledge of premature birth.  Current Allergies, Complete Past Medical History, Past Surgical History, Family History, and Social History were reviewed in [employer] record.  ROS  The following are not active complaints unless bolded Hoarseness, sore throat, dysphagia, dental problems, itching, sneezing,  nasal congestion or discharge of excess mucus or purulent secretions, ear ache,   fever, chills, sweats, unintended wt loss or wt gain, classically pleuritic or exertional cp,  orthopnea pnd or arm/hand swelling  or leg swelling, presyncope, palpitations, abdominal pain, anorexia, nausea, vomiting, diarrhea  or change in bowel habits or  change in bladder habits, change in stools or change in urine, dysuria, hematuria,  rash, arthralgias, visual complaints, headache, numbness, weakness or ataxia or problems with walking or coordination,  change in mood or  memory.        Current Meds  Medication Sig   Ascorbic Acid (VITAMIN C PO) Take 500 mg by mouth daily.   aspirin EC 81 MG tablet Take 81 mg by mouth daily.   bisoprolol-hydrochlorothiazide (ZIAC) 5-6.25 MG tablet Take  1 tablet  Daily  for BP   budesonide (PULMICORT) 0.25 MG/2ML nebulizer solution USE 1 VIAL  IN  NEBULIZER TWICE  DAILY - rinse mouth after treatment    Cholecalciferol (VITAMIN D PO) Take 5,000 Units by mouth daily.   dicyclomine (BENTYL) 20 MG tablet Take 1/2 to 1 tablet 4 x /day before Meals & Bedtime for Abdominal Cramping , Bloating or Nausea   formoterol (PERFOROMIST) 20 MCG/2ML nebulizer solution USE 1 VIAL  IN  NEBULIZER TWICE  DAILY - morning and evening   Ipratropium-Albuterol (COMBIVENT RESPIMAT) 20-100 MCG/ACT AERS respimat USE 1 INHALATION EVERY 6 HOURS AS NEEDED FOR WHEEZING   levothyroxine (SYNTHROID) 75 MCG tablet TAKE AS INSTRUCTED BY YOUR PRESCRIBER   loratadine (CLARITIN) 10 MG tablet Take 1 tablet (10 mg total) by mouth daily as needed for allergies.   meclizine (ANTIVERT) 25 MG tablet Take 1/2 to 1 tablet 3 x /day as needed for dizziness / Vertigo   omeprazole (PRILOSEC) 40 MG capsule Take  1 capsule  Daily  to Prevent Heartburn & Indigestion   sertraline (ZOLOFT) 50 MG tablet Take  1 tablet  Daily  for mood.   traMADol (ULTRAM) 50 MG tablet Take 1 tablet (50 mg total) by mouth every 6 (six) hours as needed.   vitamin E 600 UNIT capsule Take 600 Units by mouth daily.   zinc gluconate 50 MG tablet Take 50 mg by mouth daily.   Current Facility-Administered Medications for the 08/15/21 encounter (Office Visit) with [name], MD  Medication   omalizumab Arvid Right) injection 150 mg   omalizumab Arvid Right) injection 150 mg                       Objective:   Physical Exam  Wts   08/15/2021        122  08/10/2020          123 06/04/2019        125   11/18/2017     127   05/02/15 136 lb (61.689 kg)  11/29/14 136 lb (61.689 kg)  03/23/14 138 lb (62.596 kg)     Vital signs reviewed  08/15/2021  - Note at rest 02 sats  97% on RA   General appearance:    amb wf nad     HEENT : Oropharynx  clear   Nasal turbinates nl    NECK :  without  apparent JVD/ palpable Nodes/TM    LUNGS: no acc muscle use,  Min barrel  contour chest wall with bilateral  slightly decreased bs s audible wheeze and  without cough on  insp or exp maneuvers and min  Hyperresonant  to  percussion bilaterally    CV:  RRR  no s3 or murmur or increase in P2, and no edema   ABD:  soft and nontender with pos end  insp Hoover's  in the supine position.  No bruits or organomegaly appreciated   MS:  Nl gait/ ext warm without deformities  Or obvious joint restrictions  calf tenderness, cyanosis or clubbing     SKIN: warm and dry without lesions    NEURO:  alert, approp, nl sensorium with  no motor or cerebellar deficits apparent.              Assessment:

## 2021-08-15 NOTE — Patient Instructions (Signed)
No change in medications    Follow up yearly is fine

## 2021-08-15 NOTE — Assessment & Plan Note (Addendum)
Onset her late 20's Xolair rx since at least 2009 per EMR  - Spirometry 07/05/08  FEV1 1.60 (100%)  Ratio 62 p 15% resp to saba   - changed to monthly  interval per Dr Annamaria Boots rec as of 05/02/2015    - Spirometry 11/18/2017  FEV1 1,0  (68%)  Ratio 61  p am formoterol/bud - FENO 11/18/2017  =   27 on bud neb  - 08/10/2020  After extensive coaching inhaler device,  effectiveness =   Struggles with [facility]   All goals of chronic asthma control met including optimal function and elimination of symptoms with minimal need for rescue therapy > continue xolair / f/u yearly   Contingencies discussed in full including contacting this office immediately if not controlling the symptoms using the rule of two's.            Each maintenance medication was reviewed in detail including emphasizing most importantly the difference between maintenance and prns and under what circumstances the prns are to be triggered using an action plan format where appropriate.  Total time for H and P, chart review, counseling, reviewing smi/neb device(s) and generating customized AVS unique to this office visit / same day charting = 24 min yearly eval

## 2021-08-16 ENCOUNTER — Ambulatory Visit (INDEPENDENT_AMBULATORY_CARE_PROVIDER_SITE_OTHER): Payer: Medicare Other | Admitting: Nurse Practitioner

## 2021-08-16 ENCOUNTER — Encounter: Payer: Self-pay | Admitting: Nurse Practitioner

## 2021-08-16 VITALS — BP 107/58 | HR 52 | Temp 96.8°F | Ht 60.0 in | Wt 123.2 lb

## 2021-08-16 DIAGNOSIS — I10 Essential (primary) hypertension: Secondary | ICD-10-CM | POA: Diagnosis not present

## 2021-08-16 DIAGNOSIS — F3341 Major depressive disorder, recurrent, in partial remission: Secondary | ICD-10-CM

## 2021-08-16 DIAGNOSIS — J4540 Moderate persistent asthma, uncomplicated: Secondary | ICD-10-CM

## 2021-08-16 DIAGNOSIS — Z79899 Other long term (current) drug therapy: Secondary | ICD-10-CM

## 2021-08-16 DIAGNOSIS — E782 Mixed hyperlipidemia: Secondary | ICD-10-CM

## 2021-08-16 DIAGNOSIS — K219 Gastro-esophageal reflux disease without esophagitis: Secondary | ICD-10-CM

## 2021-08-16 DIAGNOSIS — E559 Vitamin D deficiency, unspecified: Secondary | ICD-10-CM

## 2021-08-16 DIAGNOSIS — Z0000 Encounter for general adult medical examination without abnormal findings: Secondary | ICD-10-CM

## 2021-08-16 DIAGNOSIS — E039 Hypothyroidism, unspecified: Secondary | ICD-10-CM

## 2021-08-16 DIAGNOSIS — R42 Dizziness and giddiness: Secondary | ICD-10-CM

## 2021-08-16 DIAGNOSIS — M810 Age-related osteoporosis without current pathological fracture: Secondary | ICD-10-CM

## 2021-08-16 NOTE — Progress Notes (Signed)
MEDICARE ANNUAL WELLNESS VISIT AND FOLLOW UP Assessment:   Kathy Whitaker was seen today for medicare wellness.  Diagnoses and all orders for this visit:  1. Encounter for Medicare annual wellness exam Due Annually  2. Essential hypertension Discussed DASH (Dietary Approaches to Stop Hypertension) DASH diet is lower in sodium than a typical American diet. Cut back on foods that are high in saturated fat, cholesterol, and trans fats. Eat more whole-grain foods, fish, poultry, and nuts Remain active and exercise as tolerated daily.  Monitor BP at home-Call if greater than 130/80.    3. Gastroesophageal reflux disease, unspecified whether esophagitis present No suspected reflux complications (Barret/stricture). Lifestyle modification:  wt loss, avoid meals 2-3h before bedtime. Consider eliminating food triggers:  chocolate, caffeine, EtOH, acid/spicy food.   4. Hypothyroidism, unspecified type Controlled. Continue Levothyroxine. Reminded to take on an empty stomach 30-71mns before food.  Stop any Biotin Supplement 48-72 hours before next TSH level to reduce the risk of falsely low TSH levels. Continue to monitor.     5. Age-related osteoporosis without current pathological fracture Continue screening. Continue Calcium and Vitamin D GYN following.  6. Vitamin D deficiency Continue Supplement. At Goal  7. Hyperlipidemia, mixed Discussed lifestyle modifications. Recommended diet heavy in fruits and veggies, omega 3's. Decrease consumption of animal meats, cheeses, and dairy products. Remain active and exercise as tolerated. Continue to monitor.   8. Depression, major, recurrent, in partial remission ([place] Controlled. Reviewed relaxation techniques.  Sleep hygiene. Recommended mindfulness meditation and exercise.   Insight-oriented psychotherapy given for 16 minutes exclusively. Psychoeducation:  encouraged personality growth wand development through coping techniques and  problem-solving skills. Limit/Decrease/Monitor drug/alcohol intake.    9. Extrinsic asthma, moderate persistent, uncomplicated Controlled. Continue medications. Avoid triggers  10. Medication management All medications discussed and reviewed in full. All questions and concerns regarding medications addressed.  11.  Vertigo Controlled Continue management with medication. F/u PRN     I discussed the assessment and treatment plan with the patient. The patient was provided an opportunity to ask questions and all were answered. The patient agreed with the plan and demonstrated an understanding of the instructions.   The patient was advised to call back or seek an in-person evaluation if the symptoms worsen or if the condition fails to improve as anticipated.  I provided 30 minutes of face-to-face time during this encounter including counseling, chart review, and critical decision making was performed.  Future Appointments  Date Time Provider DMount Pleasant 08/20/2021  1:00 PM CHINF-CHAIR 1 CH-IN[facility] None  09/28/2021  1:00 PM CHINF-CHAIR 1 CH-IN[facility] None  10/22/2021  3:00 PM MUnk Pinto MD [facility] None  04/22/2022 11:00 AM CDarrol Jump NP [facility] None  08/16/2022 11:30 AM WTanda Rockers MD [facility] None     Plan:   During the course of the visit the patient was educated and counseled about appropriate screening and preventive services including:   Pneumococcal vaccine  Influenza vaccine Prevnar 13 Td vaccine Screening electrocardiogram, deferred, COVID19, telephone visit. Colorectal cancer screening Diabetes screening Glaucoma screening Nutrition counseling    Subjective:  Kathy DUTTis a 88y.o. female who presents for Medicare Annual Wellness Visit and 3 month follow up for hypothyroidism, hyperlipidemia, abnormal glucose, depression.  She was originally concerned for left breast lump.  Has recent lumpectomy.  Does not wish to pursue any further  treatment at this time.  She will continue to monitor every six months with Onc/GYN.  Kathy Whitaker passed away in [id number] is  having some days when she feels down but on the whole feels she is coping well. On zoloft 50 mg daily for mood and feels symptoms are currently well controlled.    Patient has history of Asthma/allergies, follows with Dr. Marlene Lard. She is on pulmicort/performist once a day, combivent PRN, 2-3 times per month, and claritin and xolair injections that have helped significantly. Cold weather can be problematic but doing well recently.   Following with neurologist, Dr. Krista Blue, for vertigo. MRI on 12/14/2016 was unremarkable excepting age-appropriate microvascular changes. She reports symptoms have since improved, meclizine does improve symptoms significantly.   BMI is Body mass index is 24.06 kg/m., she has been working on diet , repots generally active but no current intentional exercise, plans to start walking.  Wt Readings from Last 3 Encounters:  08/16/21 123 lb 3.2 oz (55.9 kg)  08/15/21 122 lb 12.8 oz (55.7 kg)  08/13/21 122 lb 8 oz (55.6 kg)   Kathy blood pressure has been controlled at home, today their BP is BP: (!) 107/58.  She denies chest pain, shortness of breath.  BP Readings from Last 3 Encounters:  08/16/21 (!) 107/58  08/15/21 112/60  08/13/21 113/69     She is on cholesterol medication  (simvastatin 40 mg daily) and denies myalgias. Kathy cholesterol is at goal. The cholesterol last visit was:   Lab Results  Component Value Date   CHOL 235 (H) 09/14/2020   HDL 56 09/14/2020   LDLCALC 148 (H) 09/14/2020   TRIG 177 (H) 09/14/2020   CHOLHDL 4.2 09/14/2020   She has not been working on diet and exercise for abnormal glucose, and denies hyperglycemia, hypoglycemia , increased appetite, nausea, paresthesia of the feet, polydipsia, polyuria, visual disturbances and vomiting. Last A1C in the office was:  Lab Results  Component Value Date   HGBA1C 5.4 06/12/2020    Last GFR Lab Results  Component Value Date   GFRNONAA >60 06/27/2021   She is on thyroid medication. Kathy medication was not changed last visit.   Lab Results  Component Value Date   TSH 3.21 05/01/2021    Patient is on Vitamin D supplement.   Lab Results  Component Value Date   VD25OH 68 06/12/2020      Medication Review:  Current Outpatient Medications (Endocrine & Metabolic):    levothyroxine (SYNTHROID) 75 MCG tablet, TAKE AS INSTRUCTED BY YOUR PRESCRIBER   Current Outpatient Medications (Cardiovascular):    bisoprolol-hydrochlorothiazide (ZIAC) 5-6.25 MG tablet, Take  1 tablet  Daily  for BP   Current Outpatient Medications (Respiratory):    budesonide (PULMICORT) 0.25 MG/2ML nebulizer solution, USE 1 VIAL  IN  NEBULIZER TWICE  DAILY - rinse mouth after treatment   formoterol (PERFOROMIST) 20 MCG/2ML nebulizer solution, USE 1 VIAL  IN  NEBULIZER TWICE  DAILY - morning and evening   Ipratropium-Albuterol (COMBIVENT RESPIMAT) 20-100 MCG/ACT AERS respimat, USE 1 INHALATION EVERY 6 HOURS AS NEEDED FOR WHEEZING   loratadine (CLARITIN) 10 MG tablet, Take 1 tablet (10 mg total) by mouth daily as needed for allergies.  Current Facility-Administered Medications (Respiratory):    omalizumab Arvid Right) injection 150 mg   omalizumab Arvid Right) injection 150 mg  Current Outpatient Medications (Analgesics):    aspirin EC 81 MG tablet, Take 81 mg by mouth daily.   traMADol (ULTRAM) 50 MG tablet, Take 1 tablet (50 mg total) by mouth every 6 (six) hours as needed.     Current Outpatient Medications (Other):    Ascorbic Acid (VITAMIN C PO),  Take 500 mg by mouth daily.   Cholecalciferol (VITAMIN D PO), Take 5,000 Units by mouth daily.   dicyclomine (BENTYL) 20 MG tablet, Take 1/2 to 1 tablet 4 x /day before Meals & Bedtime for Abdominal Cramping , Bloating or Nausea   meclizine (ANTIVERT) 25 MG tablet, Take 1/2 to 1 tablet 3 x /day as needed for dizziness / Vertigo   omeprazole  (PRILOSEC) 40 MG capsule, Take  1 capsule  Daily  to Prevent Heartburn & Indigestion   sertraline (ZOLOFT) 50 MG tablet, Take  1 tablet  Daily  for mood.   vitamin E 600 UNIT capsule, Take 600 Units by mouth daily.   zinc gluconate 50 MG tablet, Take 50 mg by mouth daily.    Allergies: Allergies  Allergen Reactions   Codeine Nausea Only   Propoxyphene N-Acetaminophen Nausea Only   Shellfish-Derived Products Rash    Current Problems (verified) has Extrinsic asthma; GERD; Abnormal glucose; Vitamin D deficiency; Hypothyroidism; Hyperlipidemia, mixed; Essential hypertension; Medication management; BMI 24.0-24.9, adult; Encounter for Medicare annual wellness exam; Vertigo; Hearing loss; Depression, major, recurrent, in partial remission ([place]); Osteoporosis; Extrinsic asthma, moderate persistent, uncomplicated; Labile hypertension; Malignant neoplasm of lower-outer quadrant of left breast of female, estrogen receptor positive ([place]); Family history of thyroid cancer; and Genetic testing on their problem list.  Screening Tests Immunization History  Administered Date(s) Administered   Influenza Whole 10/05/2009, 11/26/2010, 11/05/2011   Influenza, High Dose Seasonal PF 11/20/2012, 11/22/2013, 11/21/2014, 11/10/2015, 11/19/2016, 11/20/2017, 11/10/2018, 11/17/2019, 11/22/2020   PFIZER(Purple Top)SARS-COV-2 Vaccination 04/04/2019, 04/28/2019   Pneumococcal Conjugate-13 11/22/2013   Pneumococcal Polysaccharide-23 09/14/2008   Td 10/09/2004, 03/12/2017   Zoster, Live 02/15/2013    Preventative care: Last colonoscopy: 2015  Mammogram: 07/24/21 - Q6M DEXA: GYN follows, report requested   Prior vaccinations:  TD or Tdap: 2019  Influenza: 11/2020 Pneumococcal: 2010 Prevnar13: 2015  Shingles/Zostavax: 2015 Covid 19: 2/2, 2021, pfizer  Names of Other Physician/Practitioners you currently use: 1. [facility] here for primary care 2. Eye Exam: Dr. Katy Fitch,  last 2022,  3. Dential Exam: 2022, goes q16m Patient Care Team: MUnk Pinto MD as PCP - General (Internal Medicine) MArvella Nigh MD as Consulting Physician (Obstetrics and Gynecology) WChristinia Gully, MD (Pulmonology)- last visit 08/2020  Surgical: She  has a past surgical history that includes Tubal ligation (Bilateral, 1970); Tonsillectomy; Wisdom tooth extraction; Dilatation & currettage/hysteroscopy with resectoscope (N/A, 09/10/2013); Cataract extraction (Right, 09/03/2019); and Breast lumpectomy with radioactive seed localization (Left, 07/25/2021). Family Kathy family history includes Gout in Kathy brother; Non-Hodgkin's lymphoma in Kathy brother; Other in Kathy father; Thyroid cancer (age of onset: 145 in Kathy brother; Thyroid disease in Kathy mother. Social history  She reports that she has never smoked. She has never used smokeless tobacco. She reports current alcohol use of about 3.0 standard drinks of alcohol per week. She reports that she does not use drugs.  MEDICARE WELLNESS OBJECTIVES: Physical activity:   Cardiac risk factors:   Depression/mood screen:      09/14/2020    2:48 PM  Depression screen PHQ 2/9  Decreased Interest 1  Down, Depressed, Hopeless 1  PHQ - 2 Score 2  Altered sleeping 1  Tired, decreased energy 0  Change in appetite 1  Feeling bad or failure about yourself  0  Trouble concentrating 0  Moving slowly or fidgety/restless 0  Suicidal thoughts 0  PHQ-9 Score 4  Difficult doing work/chores Not difficult at all    ADLs:  07/25/2021   10:56 AM 09/14/2020    2:51 PM  In your present state of health, do you have any difficulty performing the following activities:  Hearing? 0 1  Vision? 0 0  Difficulty concentrating or making decisions? 0 0  Walking or climbing stairs? 0 0  Dressing or bathing? 0 0  Doing errands, shopping?  0     Cognitive Testing  Alert? Yes  Normal Appearance?Yes  Oriented to person? Yes  Place? Yes   Time? Yes  Recall of  three objects?  Yes  Can perform simple calculations? Yes  Displays appropriate judgment?Yes  Can read the correct time from a watch face?Yes  EOL planning:     Objective:   Today's Vitals   08/16/21 1113  BP: (!) 107/58  Pulse: (!) 52  Temp: (!) 96.8 F (36 C)  SpO2: 92%  Weight: 123 lb 3.2 oz (55.9 kg)  Height: 5' (1.524 m)    Body mass index is 24.06 kg/m.  General Appearance: Well nourished, in no apparent distress. Eyes: PERRLA, EOMs, conjunctiva no swelling or erythema Sinuses: No Frontal/maxillary tenderness ENT/Mouth: Ext aud canals clear, TMs without erythema, bulging. No erythema, swelling, or exudate on post pharynx.  Tonsils not swollen or erythematous. Hearing normal.  Neck: Supple, thyroid normal.  Respiratory: Respiratory effort normal, BS equal bilaterally without rales, rhonchi, wheezing or stridor.  Cardio: RRR with no MRGs. Brisk peripheral pulses without edema.  Abdomen: Soft, + BS.  Non tender, no guarding, rebound, hernias, masses. Lymphatics: Non tender without lymphadenopathy.  Musculoskeletal: Full ROM, no effusions, 5/5 strength, Normal gait Skin: Warm, dry without rashes, lesions, ecchymosis.  Neuro: Cranial nerves intact. No cerebellar symptoms.  Psych: Awake and oriented X 3, normal affect, Insight and Judgment appropriate.   Medicare Attestation I have personally reviewed: The patient's medical and social history Their use of alcohol, tobacco or illicit drugs Their current medications and supplements The patient's functional ability including ADLs,fall risks, home safety risks, cognitive, and hearing and visual impairment Diet and physical activities Evidence for depression or mood disorders  The patient's weight, height, BMI, have been recorded in the chart.  I have made referrals, counseling, and provided education to the patient based on review of the above and I have provided the patient with a written personalized care plan for  preventive services.     Magda Bernheim ANP-C  [facility]  08/16/2021

## 2021-08-20 ENCOUNTER — Ambulatory Visit (INDEPENDENT_AMBULATORY_CARE_PROVIDER_SITE_OTHER): Payer: Medicare Other | Admitting: *Deleted

## 2021-08-20 VITALS — BP 170/62 | HR 52 | Temp 98.0°F | Resp 16 | Ht 60.0 in | Wt 123.6 lb

## 2021-08-20 DIAGNOSIS — J4540 Moderate persistent asthma, uncomplicated: Secondary | ICD-10-CM | POA: Diagnosis not present

## 2021-08-20 MED ORDER — OMALIZUMAB 150 MG/ML ~~LOC~~ SOSY
150.0000 mg | PREFILLED_SYRINGE | Freq: Once | SUBCUTANEOUS | Status: AC
  Administered 2021-08-20: 150 mg via SUBCUTANEOUS
  Filled 2021-08-20: qty 1

## 2021-08-20 NOTE — Progress Notes (Signed)
Diagnosis: Asthma  Provider:  Marshell Garfinkel, MD  Procedure: Injection  Xolair (Omalizumab), Dose: 150 mg, Site: subcutaneous, Number of injections: 1  Discharge: Condition: Good, Destination: Home . AVS provided to patient.   Performed by:  Oren Beckmann, RN

## 2021-08-27 ENCOUNTER — Telehealth: Payer: Self-pay | Admitting: Genetic Counselor

## 2021-08-27 ENCOUNTER — Telehealth: Payer: Self-pay | Admitting: Internal Medicine

## 2021-08-27 ENCOUNTER — Ambulatory Visit: Payer: Self-pay | Admitting: Genetic Counselor

## 2021-08-27 DIAGNOSIS — Z808 Family history of malignant neoplasm of other organs or systems: Secondary | ICD-10-CM

## 2021-08-27 DIAGNOSIS — Z170 Estrogen receptor positive status [ER+]: Secondary | ICD-10-CM

## 2021-08-27 DIAGNOSIS — Z1379 Encounter for other screening for genetic and chromosomal anomalies: Secondary | ICD-10-CM

## 2021-08-27 DIAGNOSIS — J4530 Mild persistent asthma, uncomplicated: Secondary | ICD-10-CM

## 2021-08-27 MED ORDER — BUDESONIDE 0.25 MG/2ML IN SUSP: RESPIRATORY_TRACT | 11 refills | Status: DC

## 2021-08-27 MED ORDER — FORMOTEROL FUMARATE 20 MCG/2ML IN NEBU: INHALATION_SOLUTION | RESPIRATORY_TRACT | 11 refills | Status: DC

## 2021-08-27 NOTE — Telephone Encounter (Signed)
Faxed over office notes for patient. Medications for nebulizer were faxed over this afternoon. Nothing further needed

## 2021-08-27 NOTE — Progress Notes (Signed)
HPI:   Ms. Erlich was previously seen in the [facility] due to a personal history of breast cancer and concerns regarding a hereditary predisposition to cancer. Please refer to our prior cancer genetics clinic note for more information regarding our discussion, assessment and recommendations, at the time. Ms. Skilton recent genetic test results were disclosed to her, as were recommendations warranted by these results. These results and recommendations are discussed in more detail below.  CANCER HISTORY:  Oncology History Overview Note   Cancer Staging  Malignant neoplasm of lower-outer quadrant of left breast of female, estrogen receptor positive ([place]) Staging form: Breast, AJCC 8th Edition - Clinical stage from 06/08/2021: Stage IA (cT1b, cN0, cM0, G1, ER+, PR+, HER2-) - Signed by Truitt Merle, MD on 06/27/2021    Extrinsic asthma (Resolved)  Malignant neoplasm of lower-outer quadrant of left breast of female, estrogen receptor positive ([place])  05/30/2021 Mammogram   CLINICAL DATA:  The patient was called back for a left breast mass.   EXAM: DIGITAL DIAGNOSTIC UNILATERAL LEFT MAMMOGRAM WITH TOMOSYNTHESIS AND CAD; ULTRASOUND LEFT BREAST LIMITED  IMPRESSION: Indeterminate left breast mass only identified on the cc view.  ADDENDUM: Mammographically, the mass in the posterior aspect of the LEFT breast measures 0.7 centimeters.   Evaluation of the LEFT axilla is negative for adenopathy.   06/08/2021 Cancer Staging   Staging form: Breast, AJCC 8th Edition - Clinical stage from 06/08/2021: Stage IA (cT1b, cN0, cM0, G1, ER+, PR+, HER2-) - Signed by Truitt Merle, MD on 06/27/2021 Stage prefix: Initial diagnosis Histologic grading system: 3 grade system   06/08/2021 Initial Biopsy   Diagnosis Breast, left, needle core biopsy - INVASIVE DUCTAL CARCINOMA, GRADE 1. - DUCTAL CARCINOMA IN SITU. - SEE NOTE. Diagnosis Note The greatest tumor dimension is 0.4 cm. The DCIS has low to  intermediate nuclear grade.  PROGNOSTIC INDICATORS Results: The tumor cells are NEGATIVE for Her2 (1+). Estrogen Receptor: 100%, POSITIVE, STRONG STAINING INTENSITY Progesterone Receptor: 100%, POSITIVE, STRONG STAINING INTENSITY Proliferation Marker Ki67: <5%    06/25/2021 Initial Diagnosis   Malignant neoplasm of lower-outer quadrant of left breast of female, estrogen receptor positive ([place])   07/11/2021 Genetic Testing   Negative hereditary cancer genetic testing: no pathogenic variants detected in [facility] CancerNext+RNAinsight Panel.  Report date is July 11, 2021.   The CancerNext gene panel offered by [employer] includes sequencing, rearrangement analysis, and RNA analysis for the following 36 genes:   APC, ATM, AXIN2, BARD1, BMPR1A, BRCA1, BRCA2, BRIP1, CDH1, CDK4, CDKN2A, CHEK2, DICER1, HOXB13, EPCAM, GREM1, MLH1, MSH2, MSH3, MSH6, MUTYH, NBN, NF1, NTHL1, PALB2, PMS2, POLD1, POLE, PTEN, RAD51C, RAD51D, RECQL, SMAD4, SMARCA4, STK11, and TP53.    07/25/2021 Cancer Staging   Staging form: Breast, AJCC 8th Edition - Pathologic stage from 07/25/2021: Stage Unknown (pT1a, pNX, cM0, G1, ER+, PR+, HER2-) - Signed by Truitt Merle, MD on 08/12/2021 Stage prefix: Initial diagnosis Histologic grading system: 3 grade system     FAMILY HISTORY:  We obtained a detailed, 4-generation family history.  Significant diagnoses are listed below:      Family History  Problem Relation Age of Onset   Thyroid cancer Brother 62   Non-Hodgkin's lymphoma Brother          dx 46s     Ms. Ruffini is unaware of previous family history of genetic testing for hereditary cancer risks. There is no reported Ashkenazi Jewish ancestry. There is no known consanguinity.  GENETIC TEST RESULTS:  The [facility] +RNAinsight Panel  found no pathogenic mutations.   The CancerNext gene panel offered by [employer] includes sequencing, rearrangement analysis, and RNA analysis for the following 36 genes:   APC, ATM, AXIN2,  BARD1, BMPR1A, BRCA1, BRCA2, BRIP1, CDH1, CDK4, CDKN2A, CHEK2, DICER1, HOXB13, EPCAM, GREM1, MLH1, MSH2, MSH3, MSH6, MUTYH, NBN, NF1, NTHL1, PALB2, PMS2, POLD1, POLE, PTEN, RAD51C, RAD51D, RECQL, SMAD4, SMARCA4, STK11, and TP53.  .   The test report has been scanned into EPIC and is located under the Molecular Pathology section of the Results Review tab.  A portion of the result report is included below for reference. Genetic testing reported out on July 11, 2021.      Even though a pathogenic variant was not identified, possible explanations for the cancer in the family may include: There may be no hereditary risk for cancer in the family. The cancers in Ms. Carico and/or her family may be sporadic/familial or due to other genetic and environmental factors. There may be a gene mutation in one of these genes that current testing methods cannot detect but that chance is small. There could be another gene that has not yet been discovered, or that we have not yet tested, that is responsible for the cancer diagnoses in the family.    Therefore, it is important to remain in touch with cancer genetics in the future so that we can continue to offer Ms. Farina the most up to date genetic testing.   ADDITIONAL GENETIC TESTING:  We discussed with Ms. Looman that her genetic testing was fairly extensive.  If there are additional relevant genes identified to increase cancer risk that can be analyzed in the future, we would be happy to discuss and coordinate this testing at that time.     CANCER SCREENING RECOMMENDATIONS:  Ms. Fromer test result is considered negative (normal).  This means that we have not identified a hereditary cause for her personal history of breast cancer at this time.   An individual's cancer risk and medical management are not determined by genetic test results alone. Overall cancer risk assessment incorporates additional factors, including personal medical history, family history, and any  available genetic information that may result in a personalized plan for cancer prevention and surveillance. Therefore, it is recommended she continue to follow the cancer management and screening guidelines provided by her oncology and primary healthcare provider.  RECOMMENDATIONS FOR FAMILY MEMBERS:   Since she did not inherit a identifiable mutation in a cancer predisposition gene included on this panel, her children could not have inherited a known mutation from her in one of these genes. Individuals in this family might be at some increased risk of developing cancer, over the general population risk, due to the family history of cancer.  Individuals in the family should notify their providers of the family history of cancer. We recommend women in this family have a yearly mammogram beginning at age 50, or 30 years younger than the earliest onset of cancer, an annual clinical breast exam, and perform monthly breast self-exams.     FOLLOW-UP:  Lastly, we discussed with Ms. Dishman that cancer genetics is a rapidly advancing field and it is possible that new genetic tests will be appropriate for her and/or her family members in the future. We encouraged her to remain in contact with cancer genetics on an annual basis so we can update her personal and family histories and let her know of advances in cancer genetics that may benefit this family.   Our contact number  was provided. Ms. Golberg questions were answered to her satisfaction, and she knows she is welcome to call us at anytime with additional questions or concerns.   Jakaiya Netherland M. Joette Catching, [place], [facility] Genetic Counselor [email or web address] (P) [phone]

## 2021-08-27 NOTE — Telephone Encounter (Signed)
Revealed negative genetic testing .  Discussed that we do not know why she has breast cancer--most likely sporadic/famillial.

## 2021-08-27 NOTE — Telephone Encounter (Signed)
Called and spoke with patient. Patient verified her pharmacy. Prescriptions were sent.   Nothing further needed.

## 2021-08-28 ENCOUNTER — Telehealth: Payer: Self-pay | Admitting: Internal Medicine

## 2021-08-28 NOTE — Telephone Encounter (Signed)
Requested OV notes faxed to [facility] attention Jessica.  Confirmation received.  Nothing further at this time.

## 2021-08-29 MED ORDER — BUDESONIDE 0.25 MG/2ML IN SUSP: RESPIRATORY_TRACT | 11 refills | Status: DC

## 2021-08-29 NOTE — Telephone Encounter (Signed)
Called and spoke with Janett Billow. She stated that [facility] has everything that they need and that the patients to call [facility]. Per Janett Billow, she left a VM for the patient yesterday afternoon. I spoke with the patient again. She verbalized understanding. I advised her to call the pharmacy while in office just to make sure everything is correct.

## 2021-08-29 NOTE — Addendum Note (Signed)
Addended by: Valerie Salts on: 08/29/2021 02:25 PM   Modules accepted: Orders

## 2021-09-17 ENCOUNTER — Ambulatory Visit: Payer: Medicare Other | Admitting: Nurse Practitioner

## 2021-09-28 ENCOUNTER — Ambulatory Visit (INDEPENDENT_AMBULATORY_CARE_PROVIDER_SITE_OTHER): Payer: Medicare Other

## 2021-09-28 VITALS — BP 145/63 | HR 53 | Temp 97.5°F | Resp 18 | Ht 60.0 in | Wt 122.8 lb

## 2021-09-28 DIAGNOSIS — J4540 Moderate persistent asthma, uncomplicated: Secondary | ICD-10-CM | POA: Diagnosis not present

## 2021-09-28 MED ORDER — OMALIZUMAB 150 MG/ML ~~LOC~~ SOSY
150.0000 mg | PREFILLED_SYRINGE | Freq: Once | SUBCUTANEOUS | Status: AC
  Administered 2021-09-28: 150 mg via SUBCUTANEOUS
  Filled 2021-09-28: qty 1

## 2021-09-28 NOTE — Progress Notes (Signed)
Diagnosis: Asthma  Provider:  Marshell Garfinkel MD  Procedure: Injection  Xolair (Omalizumab), Dose: 150 mg, Site: subcutaneous, Number of injections: 1  Discharge: Condition: Good, Destination: Home . AVS provided to patient.   Performed by:  Adelina Mings, LPN

## 2021-10-18 NOTE — Progress Notes (Unsigned)
Future Appointments  Date Time Provider Department  10/19/2021 10:00 AM [name], MD [facility]  12/13/2021  3:00 PM [name], MD [facility]  04/22/2022 11:00 AM [name], NP [facility]  08/16/2022 11:30 AM [name], MD [facility]    History of Present Illness:        This very nice 88 y.o. [place] with HTN, HLD, Prediabetes, Allergic Asthma  and Vitamin D Deficiency who presents with c/o  intermittent positional Vertigo which she has had long standing.  She has used OTC "Bonine" most days.        She's also c/o Rt knee pain w/o hx/o injury. Has used Ibuprofen occasionally.   Medications    levothyroxine  75 MCG tablet, TAKE AS INSTRUCTED    ZIAC 5-6.25 MG tablet, Take  1 tablet  Daily  for BP   PULMICORT 0.25 MG/2ML neb soln, USE 1 VIAL  IN  NEBULIZER TWICE  DAILY    PERFOROMIST 20 MCG/2ML nebulizer solution, USE 1 VIAL  IN  NEBULIZER TWICE  DAILY    COMBIVENT RESPIMAT, USE 1 INHALATION EVERY 6 HOURS AS NEEDED FOR WHEEZING   loratadine (CLARITIN) 10 MG tablet, Take 1 tablet (10 mg total) by mouth daily as needed for allergies.   omalizumab Arvid Right) injection 150 mg   aspirin EC 81 MG tablet, Take  daily.   traMADol (ULTRAM) 50 MG tablet, Take 1 tablet every 6 (six) hours as needed.   Ascorbic Acid (VITAMIN C PO), Take 500 mg by mouth daily.   Cholecalciferol (VITAMIN D PO), Take 5,000 Units by mouth daily.   dicyclomine (BENTYL) 20 MG tablet, Take 1/2 to 1 tablet 4 x /day before Meals & Bedtime    meclizine (ANTIVERT) 25 MG tablet, Take 1/2 to 1 tablet 3 x /day as needed for dizziness / Vertigo   omeprazole (PRILOSEC) 40 MG capsule, Take  1 capsule  Daily  to Prevent Heartburn & Indigestion   sertraline (ZOLOFT) 50 MG tablet, Take  1 tablet  Daily  for mood.   vitamin E 600 UNIT capsule, Take 600 Units by mouth daily.   zinc gluconate 50 MG tablet, Take 50 mg by mouth daily.   Problem list She has GERD; Abnormal glucose; Vitamin D deficiency;  Hypothyroidism; Hyperlipidemia, mixed; Essential hypertension; Medication management; BMI 24.0-24.9, adult; Vertigo;Hearing loss; Depression, major, recurrent, in partial remission ([place]); Osteoporosis; Extrinsic asthma, moderate persistent, uncomplicated; Labile hypertension; Malignant neoplasm of lower-outer quadrant of left breast of female, estrogen receptor positive ([place]); Family history of thyroid cancer; and Genetic testing on their problem list.   Observations/Objective:  BP (!) 100/58 Comment: 11/58 standing, 120/62 sitting  Pulse (!) 55   Temp 97.9 F (36.6 C)   Resp 16   Ht 5' (1.524 m)   Wt 124 lb 12.8 oz (56.6 kg)   SpO2 95%   BMI 24.37 kg/m   Postural                   Sitting   BP 120/62                &                  Standing   BP 110/58  HEENT - WNL. Neck - supple.  Chest - Clear equal BS. Cor - Nl HS. RRR w/o sig MGR. PP 1(+). No edema. MS- FROM w/o deformities.  Sl synovitis of the Rt knee. No definite effusion. Mild crepitus. Gait Nl.  Neuro -  Nl w/o focal abnormalities.   Assessment and Plan:  1. Vertigo  - meclizine (ANTIVERT) 25 MG tablet; Take 1/2 to 1 tablet 2 to 3 x /day as needed for Dizziness / Vertigo  Dispense: 270 tablet; Refill: 3  2. Acute pain of right knee  - meloxicam (MOBIC) 15 MG tablet; Take 1/2 to 1 tablet Daily with Food  for Pain & Inflammation  Dispense: 90 tablet; Refill: 3 - Ambulatory referral to Orthopedic Surgery  3. Essential hypertension    Follow Up Instructions:       I discussed the assessment and treatment plan with the patient. The patient was provided an opportunity to ask questions and all were answered. The patient agreed with the plan and demonstrated an understanding of the instructions.       The patient was advised to call back or seek an in-person evaluation if the symptoms worsen or if the condition fails to improve as anticipated.   Kirtland Bouchard, MD

## 2021-10-19 ENCOUNTER — Encounter: Payer: Self-pay | Admitting: Internal Medicine

## 2021-10-19 ENCOUNTER — Ambulatory Visit (INDEPENDENT_AMBULATORY_CARE_PROVIDER_SITE_OTHER): Payer: Medicare Other | Admitting: Internal Medicine

## 2021-10-19 VITALS — BP 100/58 | HR 55 | Temp 97.9°F | Resp 16 | Ht 60.0 in | Wt 124.8 lb

## 2021-10-19 DIAGNOSIS — M25561 Pain in right knee: Secondary | ICD-10-CM | POA: Diagnosis not present

## 2021-10-19 DIAGNOSIS — R42 Dizziness and giddiness: Secondary | ICD-10-CM | POA: Diagnosis not present

## 2021-10-19 DIAGNOSIS — I10 Essential (primary) hypertension: Secondary | ICD-10-CM | POA: Diagnosis not present

## 2021-10-19 MED ORDER — MECLIZINE HCL 25 MG PO TABS: ORAL_TABLET | ORAL | 3 refills | Status: AC

## 2021-10-19 MED ORDER — MELOXICAM 15 MG PO TABS: ORAL_TABLET | ORAL | 3 refills | Status: DC

## 2021-10-20 ENCOUNTER — Encounter: Payer: Self-pay | Admitting: Internal Medicine

## 2021-10-20 NOTE — Patient Instructions (Signed)
Benign Positional Vertigo Vertigo is the feeling that you or your surroundings are moving when they are not. Benign positional vertigo is the most common form of vertigo. This is usually a harmless condition (benign). This condition is positional. This means that symptoms are triggered by certain movements and positions. This condition can be dangerous if it occurs while you are doing something that could cause harm to yourself or others. This includes activities such as driving or operating machinery. What are the causes? The inner ear has fluid-filled canals that help your brain sense movement and balance. When the fluid moves, the brain receives messages about your body's position. With benign positional vertigo, calcium crystals in the inner ear break free and disturb the inner ear area. This causes your brain to receive confusing messages about your body's position. What increases the risk? You are more likely to develop this condition if: You are a woman. You are 50 years of age or older. You have recently had a head injury. You have an inner ear disease. What are the signs or symptoms? Symptoms of this condition usually happen when you move your head or your eyes in different directions. Symptoms may start suddenly and usually last for less than a minute. They include: Loss of balance and falling. Feeling like you are spinning or moving. Feeling like your surroundings are spinning or moving. Nausea and vomiting. Blurred vision. Dizziness. Involuntary eye movement (nystagmus). Symptoms can be mild and cause only minor problems, or they can be severe and interfere with daily life. Episodes of benign positional vertigo may return (recur) over time. Symptoms may also improve over time. How is this diagnosed? This condition may be diagnosed based on: Your medical history. A physical exam of the head, neck, and ears. Positional tests to check for or stimulate vertigo. You may be asked to  turn your head and change positions, such as going from sitting to lying down. A health care provider will watch for symptoms of vertigo. You may be referred to a health care provider who specializes in ear, nose, and throat problems (ENT or otolaryngologist) or a provider who specializes in disorders of the nervous system (neurologist). How is this treated?  This condition may be treated in a session in which your health care provider moves your head in specific positions to help the displaced crystals in your inner ear move. Treatment for this condition may take several sessions. Surgery may be needed in severe cases, but this is rare. In some cases, benign positional vertigo may resolve on its own in 2-4 weeks. Follow these instructions at home: Safety Move slowly. Avoid sudden body or head movements or certain positions, as told by your health care provider. Avoid driving or operating machinery until your health care provider says it is safe. Avoid doing any tasks that would be dangerous to you or others if vertigo occurs. If you have trouble walking or keeping your balance, try using a cane for stability. If you feel dizzy or unstable, sit down right away. Return to your normal activities as told by your health care provider. Ask your health care provider what activities are safe for you. General instructions Take over-the-counter and prescription medicines only as told by your health care provider. Drink enough fluid to keep your urine pale yellow. Keep all follow-up visits. This is important. Contact a health care provider if: You have a fever. Your condition gets worse or you develop new symptoms. Your family or friends notice any behavioral changes.   You have nausea or vomiting that gets worse. You have numbness or a prickling and tingling sensation. Get help right away if you: Have difficulty speaking or moving. Are always dizzy or faint. Develop severe headaches. Have weakness in  your legs or arms. Have changes in your hearing or vision. Develop a stiff neck. Develop sensitivity to light. These symptoms may represent a serious problem that is an emergency. Do not wait to see if the symptoms will go away. Get medical help right away. Call your local emergency services (911 in the [place] Do not drive yourself to the hospital. Summary Vertigo is the feeling that you or your surroundings are moving when they are not. Benign positional vertigo is the most common form of vertigo. This condition is caused by calcium crystals in the inner ear that become displaced. This causes a disturbance in an area of the inner ear that helps your brain sense movement and balance. Symptoms include loss of balance and falling, feeling that you or your surroundings are moving, nausea and vomiting, and blurred vision. This condition can be diagnosed based on symptoms, a physical exam, and positional tests. Follow safety instructions as told by your health care provider and keep all follow-up visits. This is important. This information is not intended to replace advice given to you by your health care provider. Make sure you discuss any questions you have with your health care provider. Document Revised: 12/22/2019 Document Reviewed: 12/22/2019 Elsevier Patient Education  2023 Elsevier Inc.  

## 2021-10-22 ENCOUNTER — Encounter: Payer: Medicare Other | Admitting: Internal Medicine

## 2021-10-25 DIAGNOSIS — M1711 Unilateral primary osteoarthritis, right knee: Secondary | ICD-10-CM | POA: Diagnosis not present

## 2021-10-25 DIAGNOSIS — M25561 Pain in right knee: Secondary | ICD-10-CM | POA: Diagnosis not present

## 2021-10-29 ENCOUNTER — Ambulatory Visit (INDEPENDENT_AMBULATORY_CARE_PROVIDER_SITE_OTHER): Payer: Medicare Other

## 2021-10-29 VITALS — BP 144/81 | HR 55 | Temp 97.7°F | Resp 18 | Ht 60.0 in | Wt 127.0 lb

## 2021-10-29 DIAGNOSIS — J4540 Moderate persistent asthma, uncomplicated: Secondary | ICD-10-CM | POA: Diagnosis not present

## 2021-10-29 MED ORDER — OMALIZUMAB 150 MG/ML ~~LOC~~ SOSY
150.0000 mg | PREFILLED_SYRINGE | Freq: Once | SUBCUTANEOUS | Status: AC
  Administered 2021-10-29: 150 mg via SUBCUTANEOUS

## 2021-10-29 NOTE — Progress Notes (Signed)
Diagnosis: Asthma  Provider:  Marshell Garfinkel MD  Procedure: Injection  Xolair (Omalizumab), Dose: 150 mg, Site: subcutaneous, Number of injections: 1  Discharge: Condition: Good, Destination: Home . AVS Declined. Performed by:  Arnoldo Morale, RN

## 2021-11-13 ENCOUNTER — Ambulatory Visit (INDEPENDENT_AMBULATORY_CARE_PROVIDER_SITE_OTHER): Payer: Medicare Other

## 2021-11-13 VITALS — Temp 98.0°F

## 2021-11-13 DIAGNOSIS — M1711 Unilateral primary osteoarthritis, right knee: Secondary | ICD-10-CM | POA: Diagnosis not present

## 2021-11-13 DIAGNOSIS — Z23 Encounter for immunization: Secondary | ICD-10-CM | POA: Diagnosis not present

## 2021-11-14 DIAGNOSIS — M25661 Stiffness of right knee, not elsewhere classified: Secondary | ICD-10-CM | POA: Diagnosis not present

## 2021-11-14 DIAGNOSIS — M1711 Unilateral primary osteoarthritis, right knee: Secondary | ICD-10-CM | POA: Diagnosis not present

## 2021-11-14 DIAGNOSIS — R262 Difficulty in walking, not elsewhere classified: Secondary | ICD-10-CM | POA: Diagnosis not present

## 2021-11-14 DIAGNOSIS — R531 Weakness: Secondary | ICD-10-CM | POA: Diagnosis not present

## 2021-11-16 ENCOUNTER — Other Ambulatory Visit: Payer: Self-pay | Admitting: Internal Medicine

## 2021-11-16 DIAGNOSIS — R531 Weakness: Secondary | ICD-10-CM | POA: Diagnosis not present

## 2021-11-16 DIAGNOSIS — M25661 Stiffness of right knee, not elsewhere classified: Secondary | ICD-10-CM | POA: Diagnosis not present

## 2021-11-16 DIAGNOSIS — M1711 Unilateral primary osteoarthritis, right knee: Secondary | ICD-10-CM | POA: Diagnosis not present

## 2021-11-16 DIAGNOSIS — R262 Difficulty in walking, not elsewhere classified: Secondary | ICD-10-CM | POA: Diagnosis not present

## 2021-11-16 DIAGNOSIS — R0989 Other specified symptoms and signs involving the circulatory and respiratory systems: Secondary | ICD-10-CM

## 2021-11-20 DIAGNOSIS — M1711 Unilateral primary osteoarthritis, right knee: Secondary | ICD-10-CM | POA: Diagnosis not present

## 2021-11-20 DIAGNOSIS — R262 Difficulty in walking, not elsewhere classified: Secondary | ICD-10-CM | POA: Diagnosis not present

## 2021-11-20 DIAGNOSIS — M25661 Stiffness of right knee, not elsewhere classified: Secondary | ICD-10-CM | POA: Diagnosis not present

## 2021-11-20 DIAGNOSIS — R531 Weakness: Secondary | ICD-10-CM | POA: Diagnosis not present

## 2021-11-22 DIAGNOSIS — M1711 Unilateral primary osteoarthritis, right knee: Secondary | ICD-10-CM | POA: Diagnosis not present

## 2021-11-22 DIAGNOSIS — R262 Difficulty in walking, not elsewhere classified: Secondary | ICD-10-CM | POA: Diagnosis not present

## 2021-11-22 DIAGNOSIS — M25661 Stiffness of right knee, not elsewhere classified: Secondary | ICD-10-CM | POA: Diagnosis not present

## 2021-11-22 DIAGNOSIS — R531 Weakness: Secondary | ICD-10-CM | POA: Diagnosis not present

## 2021-11-26 DIAGNOSIS — M25661 Stiffness of right knee, not elsewhere classified: Secondary | ICD-10-CM | POA: Diagnosis not present

## 2021-11-26 DIAGNOSIS — R531 Weakness: Secondary | ICD-10-CM | POA: Diagnosis not present

## 2021-11-26 DIAGNOSIS — M1711 Unilateral primary osteoarthritis, right knee: Secondary | ICD-10-CM | POA: Diagnosis not present

## 2021-11-26 DIAGNOSIS — R262 Difficulty in walking, not elsewhere classified: Secondary | ICD-10-CM | POA: Diagnosis not present

## 2021-11-28 DIAGNOSIS — R262 Difficulty in walking, not elsewhere classified: Secondary | ICD-10-CM | POA: Diagnosis not present

## 2021-11-28 DIAGNOSIS — R531 Weakness: Secondary | ICD-10-CM | POA: Diagnosis not present

## 2021-11-28 DIAGNOSIS — M25661 Stiffness of right knee, not elsewhere classified: Secondary | ICD-10-CM | POA: Diagnosis not present

## 2021-11-28 DIAGNOSIS — M1711 Unilateral primary osteoarthritis, right knee: Secondary | ICD-10-CM | POA: Diagnosis not present

## 2021-11-30 ENCOUNTER — Encounter: Payer: Self-pay | Admitting: *Deleted

## 2021-12-03 ENCOUNTER — Ambulatory Visit (INDEPENDENT_AMBULATORY_CARE_PROVIDER_SITE_OTHER): Payer: Medicare Other

## 2021-12-03 VITALS — BP 131/72 | HR 56 | Temp 97.5°F | Resp 20 | Ht 60.0 in | Wt 125.6 lb

## 2021-12-03 DIAGNOSIS — R262 Difficulty in walking, not elsewhere classified: Secondary | ICD-10-CM | POA: Diagnosis not present

## 2021-12-03 DIAGNOSIS — M25661 Stiffness of right knee, not elsewhere classified: Secondary | ICD-10-CM | POA: Diagnosis not present

## 2021-12-03 DIAGNOSIS — R531 Weakness: Secondary | ICD-10-CM | POA: Diagnosis not present

## 2021-12-03 DIAGNOSIS — J4540 Moderate persistent asthma, uncomplicated: Secondary | ICD-10-CM

## 2021-12-03 DIAGNOSIS — M1711 Unilateral primary osteoarthritis, right knee: Secondary | ICD-10-CM | POA: Diagnosis not present

## 2021-12-03 MED ORDER — OMALIZUMAB 150 MG/ML ~~LOC~~ SOSY
150.0000 mg | PREFILLED_SYRINGE | Freq: Once | SUBCUTANEOUS | Status: AC
  Administered 2021-12-03: 150 mg via SUBCUTANEOUS
  Filled 2021-12-03: qty 1

## 2021-12-03 NOTE — Progress Notes (Signed)
+  Diagnosis: Iron Deficiency Anemia  Provider:  Marshell Garfinkel MD  Procedure: Injection  Xolair (Omalizumab), Dose: 150 mg, Site: subcutaneous, Number of injections: 1  Post Care:     Discharge: Condition: Good, Destination: Home . AVS provided to patient.   Performed by:  Arnoldo Morale, RN

## 2021-12-05 DIAGNOSIS — R531 Weakness: Secondary | ICD-10-CM | POA: Diagnosis not present

## 2021-12-05 DIAGNOSIS — M25661 Stiffness of right knee, not elsewhere classified: Secondary | ICD-10-CM | POA: Diagnosis not present

## 2021-12-05 DIAGNOSIS — M1711 Unilateral primary osteoarthritis, right knee: Secondary | ICD-10-CM | POA: Diagnosis not present

## 2021-12-05 DIAGNOSIS — R262 Difficulty in walking, not elsewhere classified: Secondary | ICD-10-CM | POA: Diagnosis not present

## 2021-12-11 DIAGNOSIS — M25661 Stiffness of right knee, not elsewhere classified: Secondary | ICD-10-CM | POA: Diagnosis not present

## 2021-12-11 DIAGNOSIS — R531 Weakness: Secondary | ICD-10-CM | POA: Diagnosis not present

## 2021-12-11 DIAGNOSIS — R262 Difficulty in walking, not elsewhere classified: Secondary | ICD-10-CM | POA: Diagnosis not present

## 2021-12-11 DIAGNOSIS — M1711 Unilateral primary osteoarthritis, right knee: Secondary | ICD-10-CM | POA: Diagnosis not present

## 2021-12-13 ENCOUNTER — Ambulatory Visit (INDEPENDENT_AMBULATORY_CARE_PROVIDER_SITE_OTHER): Payer: Medicare Other | Admitting: Internal Medicine

## 2021-12-13 ENCOUNTER — Encounter: Payer: Self-pay | Admitting: Internal Medicine

## 2021-12-13 VITALS — BP 128/70 | HR 55 | Temp 97.8°F | Resp 16 | Ht 60.0 in | Wt 127.8 lb

## 2021-12-13 DIAGNOSIS — Z79899 Other long term (current) drug therapy: Secondary | ICD-10-CM | POA: Diagnosis not present

## 2021-12-13 DIAGNOSIS — E559 Vitamin D deficiency, unspecified: Secondary | ICD-10-CM

## 2021-12-13 DIAGNOSIS — E782 Mixed hyperlipidemia: Secondary | ICD-10-CM

## 2021-12-13 DIAGNOSIS — R7309 Other abnormal glucose: Secondary | ICD-10-CM

## 2021-12-13 DIAGNOSIS — M25661 Stiffness of right knee, not elsewhere classified: Secondary | ICD-10-CM | POA: Diagnosis not present

## 2021-12-13 DIAGNOSIS — Z136 Encounter for screening for cardiovascular disorders: Secondary | ICD-10-CM

## 2021-12-13 DIAGNOSIS — Z853 Personal history of malignant neoplasm of breast: Secondary | ICD-10-CM

## 2021-12-13 DIAGNOSIS — R0989 Other specified symptoms and signs involving the circulatory and respiratory systems: Secondary | ICD-10-CM | POA: Diagnosis not present

## 2021-12-13 DIAGNOSIS — E039 Hypothyroidism, unspecified: Secondary | ICD-10-CM

## 2021-12-13 DIAGNOSIS — M1711 Unilateral primary osteoarthritis, right knee: Secondary | ICD-10-CM | POA: Diagnosis not present

## 2021-12-13 DIAGNOSIS — Z1211 Encounter for screening for malignant neoplasm of colon: Secondary | ICD-10-CM

## 2021-12-13 DIAGNOSIS — R531 Weakness: Secondary | ICD-10-CM | POA: Diagnosis not present

## 2021-12-13 DIAGNOSIS — R262 Difficulty in walking, not elsewhere classified: Secondary | ICD-10-CM | POA: Diagnosis not present

## 2021-12-13 NOTE — Progress Notes (Signed)
Annual Screening/Preventative Visit & Comprehensive Evaluation &  Examination   Future Appointments  Date Time Provider Department  12/13/2021                           cpe  3:00 PM [name], MD [facility]  01/09/2022  2:00 PM [facility]  04/22/2022                          wellness 11:00 AM [name], NP [facility]  08/16/2022 11:30 AM [name], MD [facility]  12/19/2022                         cpe  3:00 PM [name], MD [facility]         This very nice 88 y.o. MWF with  HTN, HLD, Prediabetes  and Vitamin D Deficiency  who presents for a Screening /Preventative Visit & comprehensive evaluation and management of multiple medical co-morbidities.  Patient  is followed by [name] & is on Xolair for Allergic Asthma.       In June 2023 , patient had a Lt Breast lumpectomy for Stage 1a Breast Ca. Patient was seen in follow-up by [name] & declined adjuvant antiestrogen therapy .            Labile HTN predates  circa 2004 followed expectantly and in 2013, she had a negative Stress Myoview.  Patient's BP has been controlled at home and patient denies any cardiac symptoms as chest pain, palpitations, shortness of breath, dizziness or ankle swelling. Today's BP is at goal - 128/70 .         Patient's hyperlipidemia is controlled with diet & Simvastatin. Patient denies myalgias or other medication SE's. Last lipids were at goal :  Lab Results  Component Value Date   CHOL 235 (H) 09/14/2020   HDL 56 09/14/2020   LDLCALC 148 (H) 09/14/2020   TRIG 177 (H) 09/14/2020   CHOLHDL 4.2 09/14/2020         Patient has hx/o prediabetes (A1c 6.0% /2011 and 5.7% /2012) and patient denies reactive hypoglycemic symptoms, visual blurring, diabetic polys or paresthesias. Last A1c was normal & at goal:  Lab Results  Component Value Date   HGBA1C 5.4 06/12/2020                                              Patient has been on Thyroid Replacement since the  1980's when discovered Hypothyroid.         Finally, patient has history of Vitamin D Deficiency and last Vitamin D was near goal (60-100).   Lab Results  Component Value Date   VD25OH 68 06/12/2020       Current Outpatient Medications on File Prior to Visit  Medication Sig   VITAMIN C 500 mg  Take  daily.   aspirin EC 81 MG tablet Take daily.   budesonide  0.25 MG/2ML neb  soln USE 1 VIAL   TWICE  DAILY -   VITAMIN D 5,000 Units Take   daily.   dicyclomine  20 MG tablet Take 1/2 to 1 tablet 4 x /day     EPIPEN 0.15 MG/0.3ML injec Inject 0.15 mg into the muscle as needed.   COMBIVENT  RESPIMAT  1 INHALATION EVERY 6 HRS AS NEEDED    levothyroxine75 MCG tablet Take 1 tablet daily    loratadine  10 MG tablet Take 1 tablet  daily as needed   meclizine 25 MG tablet Take 1/2 to 1 tablet 3 x /day as needed    PERFOROMIST 20 MCG/2ML neb soln USE 1 VIAL  IN  NEB TWICE  DAILY    sertraline \ 50 MG tablet Take  1 tablet  Daily  for mood.   triamcinolone oint 0.1 % Apply  2  times daily.   zinc  50 MG tablet Take daily.    omalizumab Arvid Right) injection 150 mg     Allergies  Allergen Reactions   Codeine Nausea Only   Propoxyphene N-Acetaminophen Nausea Only   Shellfish-Derived Products Rash     Past Medical History:  Diagnosis Date   Asthma    COPD (chronic obstructive pulmonary disease) (HCC)    Depression    Dizziness    Hyperlipemia    Hypertension    Hypothyroidism    Prediabetes    SVD (spontaneous vaginal delivery)    x 2   Vitamin D deficiency      Health Maintenance  Topic Date Due   COVID-19 Vaccine (3 - Booster for Pfizer series) 10/29/2019   INFLUENZA VACCINE  09/04/2020   TETANUS/TDAP  03/13/2027   DEXA SCAN  Completed   PNA vac Low Risk Adult  Completed   HPV VACCINES  Aged Out     Immunization History  Administered Date(s) Administered   Influenza  10/05/2009, 11/26/2010, 11/05/2011   Influenza, High Dose   11/20/2017, 11/10/2018, 11/17/2019    PFIZER SARS-COV-2 Vacc 04/04/2019, 04/28/2019   Pneumococcal -13 11/22/2013   Pneumococcal -23 09/14/2008   Td 10/09/2004, 03/12/2017   Zoster 02/15/2013    Last Colon -  09/2005 - Dr Deborha Payment - No f/u due to age  Last MGM -  06/13/2021 - 07/25/2021  Past Surgical History:  Procedure Laterality Date   CATARACT EXTRACTION Right 09/03/2019   Dr. Katy Fitch   DILATATION & CURRETTAGE/HYSTEROSCOPY WITH RESECTOCOPE N/A 09/10/2013   Procedure: DILATATION & CURETTAGE/HYSTEROSCOPY WITH RESECTOCOPE;  Surgeon: Darlyn Chamber, MD;  Location: [facility];  Service: Gynecology;  Laterality: N/A;   TONSILLECTOMY     TUBAL LIGATION Bilateral 1970   WISDOM TOOTH EXTRACTION       Family History  Problem Relation Age of Onset   Thyroid disease Mother    Cancer Brother        Thyroid   Gout Brother    Hodgkin's lymphoma Brother    Other Father        unsure of medical history    Social History   Tobacco Use   Smoking status: Never Smoker   Smokeless tobacco: Never Used  Scientific laboratory technician Use: Never used  Substance Use Topics   Alcohol use: Yes    Comment: Wine daily   Drug use: No     ROS Constitutional: Denies fever, chills, weight loss/gain, headaches, insomnia,  night sweats, and change in appetite. Does c/o fatigue. Eyes: Denies redness, blurred vision, diplopia, discharge, itchy, watery eyes.  ENT: Denies discharge, congestion, post nasal drip, epistaxis, sore throat, earache, hearing loss, dental pain, Tinnitus, Vertigo, Sinus pain, snoring.  Cardio: Denies chest pain, palpitations, irregular heartbeat, syncope, dyspnea, diaphoresis, orthopnea, PND, claudication, edema Respiratory: denies cough, dyspnea, DOE, pleurisy, hoarseness, laryngitis, wheezing.  Gastrointestinal: Denies dysphagia, heartburn, reflux, water brash, pain, cramps, nausea,  vomiting, bloating, diarrhea, constipation, hematemesis, melena, hematochezia, jaundice, hemorrhoids Genitourinary: Denies dysuria, frequency,  urgency, nocturia, hesitancy, discharge, hematuria, flank pain Breast: Breast lumps, nipple discharge, bleeding.  Musculoskeletal: Denies arthralgia, myalgia, stiffness, Jt. Swelling, pain, limp, and strain/sprain. Denies falls. Skin: Denies puritis, rash, hives, warts, acne, eczema, changing in skin lesion Neuro: No weakness, tremor, incoordination, spasms, paresthesia, pain Psychiatric: Denies confusion, memory loss, sensory loss. Denies Depression. Endocrine: Denies change in weight, skin, hair change, nocturia, and paresthesia, diabetic polys, visual blurring, hyper / hypo glycemic episodes.  Heme/Lymph: No excessive bleeding, bruising, enlarged lymph nodes.  Physical Exam  There were no vitals taken for this visit.  General Appearance: Well nourished, well groomed and in no apparent distress.  Eyes: PERRLA, EOMs, conjunctiva no swelling or erythema, normal fundi and vessels. Sinuses: No frontal/maxillary tenderness ENT/Mouth: EACs patent / TMs  nl. Nares clear without erythema, swelling, mucoid exudates. Oral hygiene is good. No erythema, swelling, or exudate. Tongue normal, non-obstructing. Tonsils not swollen or erythematous. Hearing normal.  Neck: Supple, thyroid not palpable. No bruits, nodes or JVD. Respiratory: Respiratory effort normal.  BS equal and clear bilateral without rales, rhonci, wheezing or stridor. Cardio: Heart sounds are normal with regular rate and rhythm and no murmurs, rubs or gallops. Peripheral pulses are normal and equal bilaterally without edema. No aortic or femoral bruits. Chest: symmetric with normal excursions and percussion. Breasts: Symmetric, without lumps, nipple discharge, retractions, or fibrocystic changes.  Abdomen: Flat, soft with bowel sounds active. Nontender, no guarding, rebound, hernias, masses, or organomegaly.  Lymphatics: Non tender without lymphadenopathy.  Musculoskeletal: Full ROM all peripheral extremities, joint stability, 5/5  strength, and normal gait. Skin: Warm and dry without rashes, lesions, cyanosis, clubbing or  ecchymosis.  Neuro: Cranial nerves intact, reflexes equal bilaterally. Normal muscle tone, no cerebellar symptoms. Sensation intact.  Pysch: Alert and oriented X 3, normal affect, Insight and Judgment appropriate.    Assessment and Plan   1. Labile hypertension  - EKG 12-Lead - Microalbumin / creatinine urine ratio - CBC with Differential/Platelet - COMPLETE METABOLIC PANEL WITH GFR - Magnesium - TSH - Urinalysis, Routine w reflex microscopic  - Add bisoprolol-hctz 5-6.25; Take  1 tablet  Daily   2. Hyperlipidemia, mixed  - EKG 12-Lead - Lipid panel - TSH  3. Abnormal glucose  - EKG 12-Lead - Insulin, random  4. Vitamin D deficiency  - VITAMIN D 25 Hydroxy   5. Hypothyroidism  - TSH  6. Gastroesophageal reflux disease  - CBC with Differential/Platelet  7. Extrinsic asthma   8. Screening for ischemic heart disease  - EKG 12-Lead  9. Screening for colorectal cancer  - POC Hemoccult Bld/Stl  10. Medication management  - CBC with Differential/Platelet - COMPLETE METABOLIC PANEL WITH GFR - Magnesium - Lipid panel - TSH - Hemoglobin A1c - Insulin, random - VITAMIN D 25 Hydroxy - Urinalysis, Routine           Patient was counseled in prudent diet to achieve/maintain BMI less than 25 for weight control, BP monitoring, regular exercise and medications. Discussed med's effects and SE's. Screening labs and tests as requested with regular follow-up as recommended. Over 40 minutes of exam, counseling, chart review and high complex critical decision making was performed.   Kirtland Bouchard, MD

## 2021-12-13 NOTE — Patient Instructions (Addendum)
Due to recent changes in healthcare laws, you Dudash see the results of your imaging and laboratory studies on MyChart before your provider has had a chance to review them.  We understand that in some cases there Gin be results that are confusing or concerning to you. Not all laboratory results come back in the same time frame and the provider Hoback be waiting for multiple results in order to interpret others.  Please give [place] 48 hours in order for your provider to thoroughly review all the results before contacting the office for clarification of your results.   +++++++++++++++++++++++++  Vit D  & Vit C 1,000 mg   are recommended to help protect  against the Covid-19 and other Corona viruses.    Also it's recommended  to take  Zinc 50 mg  to help  protect against the Covid-19   and best place to get  is also on [email or web address]  and don't pay more than 6-8 cents /pill !  ================================ Coronavirus (COVID-19) Are you at risk?  Are you at risk for the Coronavirus (COVID-19)?  To be considered HIGH RISK for Coronavirus (COVID-19), you have to meet the following criteria:  Traveled to [place], [place], [place], [place] or [place]; or in the [place] to [place], [place], [place]  or [place]; and have fever, cough, and shortness of breath within the last 2 weeks of travel OR Been in close contact with a person diagnosed with COVID-19 within the last 2 weeks and have  fever, cough,and shortness of breath  IF YOU DO NOT MEET THESE CRITERIA, YOU ARE CONSIDERED LOW RISK FOR COVID-19.  What to do if you are HIGH RISK for COVID-19?  If you are having a medical emergency, call 911. Seek medical care right away. Before you go to a doctor's office, urgent care or emergency department,  call ahead and tell them about your recent travel, contact with someone diagnosed with COVID-19   and your symptoms.  You should receive instructions from your physician's office regarding next  steps of care.  When you arrive at healthcare provider, tell the healthcare staff immediately you have returned from  visiting [place], [place], [place], [place] or [place]; or traveled in the [place] to [place], [place],  [place] or [place] in the last two weeks or you have been in close contact with a person diagnosed with  COVID-19 in the last 2 weeks.   Tell the health care staff about your symptoms: fever, cough and shortness of breath. After you have been seen by a medical provider, you will be either: Tested for (COVID-19) and discharged home on quarantine except to seek medical care if  symptoms worsen, and asked to  Stay home and avoid contact with others until you get your results (4-5 days)  Avoid travel on public transportation if possible (such as bus, train, or airplane) or Sent to the Emergency Department by EMS for evaluation, COVID-19 testing  and  possible admission depending on your condition and test results.  What to do if you are LOW RISK for COVID-19?  Reduce your risk of any infection by using the same precautions used for avoiding the common cold or flu:  Wash your hands often with soap and warm water for at least 20 seconds.  If soap and water are not readily available,  use an alcohol-based hand sanitizer with at least 60% alcohol.  If coughing or sneezing, cover your mouth and nose by coughing  or sneezing into the elbow areas of your shirt or coat,  into a tissue or into your sleeve (not your hands). Avoid shaking hands with others and consider head nods or verbal greetings only. Avoid touching your eyes, nose, or mouth with unwashed hands.  Avoid close contact with people who are sick. Avoid places or events with large numbers of people in one location, like concerts or sporting events. Carefully consider travel plans you have or are making. If you are planning any travel outside or inside the [place], visit the CDC's Travelers' Health webpage for the  latest health notices. If you have some symptoms but not all symptoms, continue to monitor at home and seek medical attention  if your symptoms worsen. If you are having a medical emergency, call 911.   >>>>>>>>>>>>>>>>>>>>>>>>>>>>>>>>>  We Do NOT Approve of [employer] > > > > > > > > > > > > > > > > > > > > > > > > > > > > > > > > > > > > > > >  Preventive Care for Adults  A healthy lifestyle and preventive care can promote health and wellness. Preventive health guidelines for women include the following key practices. A routine yearly physical is a good way to check with your health care provider about your health and preventive screening. It is a chance to share any concerns and updates on your health and to receive a thorough exam. Visit your dentist for a routine exam and preventive care every 6 months. Brush your teeth twice a day and floss once a day. Good oral hygiene prevents tooth decay and gum disease. The frequency of eye exams is based on your age, health, family medical history, use of contact lenses, and other factors. Follow your health care provider's recommendations for frequency of eye exams. Eat a healthy diet. Foods like vegetables, fruits, whole grains, low-fat dairy products, and lean protein foods contain the nutrients you need without too many calories. Decrease your intake of foods high in solid fats, added sugars, and salt. Eat the right amount of calories for you. Get information about a proper diet from your health care provider, if necessary. Regular physical exercise is one of the most important things you can do for your health. Most adults should get at least 150 minutes of moderate-intensity exercise (any activity that increases your heart rate and causes you to sweat) each week. In addition, most adults need muscle-strengthening exercises on 2 or more days a week. Maintain a healthy weight. The body mass index (BMI) is a screening tool to identify  possible weight problems. It provides an estimate of body fat based on height and weight. Your health care provider can find your BMI and can help you achieve or maintain a healthy weight. For adults 20 years and older: A BMI below 18.5 is considered underweight. A BMI of 18.5 to 24.9 is normal. A BMI of 25 to 29.9 is considered overweight. A BMI of 30 and above is considered obese. Maintain normal blood lipids and cholesterol levels by exercising and minimizing your intake of saturated fat. Eat a balanced diet with plenty of fruit and vegetables. If your lipid or cholesterol levels are high, you are over 50, or you are at high risk for heart disease, you may need your cholesterol levels checked more frequently. Ongoing high lipid and cholesterol levels should be treated with medicines if diet and exercise are not working. If you smoke, find out from   your health care provider how to quit. If you do not use tobacco, do not start. Lung cancer screening is recommended for adults aged 55-80 years who are at high risk for developing lung cancer because of a history of smoking. A yearly low-dose CT scan of the lungs is recommended for people who have at least a 30-pack-year history of smoking and are a current smoker or have quit within the past 15 years. A pack year of smoking is smoking an average of 1 pack of cigarettes a day for 1 year (for example: 1 pack a day for 30 years or 2 packs a day for 15 years). Yearly screening should continue until the smoker has stopped smoking for at least 15 years. Yearly screening should be stopped for people who develop a health problem that would prevent them from having lung cancer treatment. Avoid use of street drugs. Do not share needles with anyone. Ask for help if you need support or instructions about stopping the use of drugs. High blood pressure causes heart disease and increases the risk of stroke.  Ongoing high blood pressure should be treated with medicines if  weight loss and exercise do not work. If you are 55-79 years old, ask your health care provider if you should take aspirin to prevent strokes. Diabetes screening involves taking a blood sample to check your fasting blood sugar level. This should be done once every 3 years, after age 45, if you are within normal weight and without risk factors for diabetes. Testing should be considered at a younger age or be carried out more frequently if you are overweight and have at least 1 risk factor for diabetes. Breast cancer screening is essential preventive care for women. You should practice "breast self-awareness." This means understanding the normal appearance and feel of your breasts and may include breast self-examination. Any changes detected, no matter how small, should be reported to a health care provider. Women in their 20s and 30s should have a clinical breast exam (CBE) by a health care provider as part of a regular health exam every 1 to 3 years. After age 40, women should have a CBE every year. Starting at age 40, women should consider having a mammogram (breast X-ray test) every year. Women who have a family history of breast cancer should talk to their health care provider about genetic screening. Women at a high risk of breast cancer should talk to their health care providers about having an MRI and a mammogram every year. Breast cancer gene (BRCA)-related cancer risk assessment is recommended for women who have family members with BRCA-related cancers. BRCA-related cancers include breast, ovarian, tubal, and peritoneal cancers. Having family members with these cancers may be associated with an increased risk for harmful changes (mutations) in the breast cancer genes BRCA1 and BRCA2. Results of the assessment will determine the need for genetic counseling and BRCA1 and BRCA2 testing. Routine pelvic exams to screen for cancer are no longer recommended for nonpregnant women who are considered low risk for  cancer of the pelvic organs (ovaries, uterus, and vagina) and who do not have symptoms. Ask your health care provider if a screening pelvic exam is right for you. If you have had past treatment for cervical cancer or a condition that could lead to cancer, you need Pap tests and screening for cancer for at least 20 years after your treatment. If Pap tests have been discontinued, your risk factors (such as having a new sexual partner) need to be   reassessed to determine if screening should be resumed. Some women have medical problems that increase the chance of getting cervical cancer. In these cases, your health care provider may recommend more frequent screening and Pap tests.  Colorectal cancer can be detected and often prevented. Most routine colorectal cancer screening begins at the age of 50 years and continues through age 75 years. However, your health care provider may recommend screening at an earlier age if you have risk factors for colon cancer. On a yearly basis, your health care provider may provide home test kits to check for hidden blood in the stool. Use of a small camera at the end of a tube, to directly examine the colon (sigmoidoscopy or colonoscopy), can detect the earliest forms of colorectal cancer. Talk to your health care provider about this at age 50, when routine screening begins.  Direct exam of the colon should be repeated every 5-10 years through age 75 years, unless early forms of pre-cancerous polyps or small growths are found. Osteoporosis is a disease in which the bones lose minerals and strength with aging. This can result in serious bone fractures or breaks. The risk of osteoporosis can be identified using a bone density scan. Women ages 65 years and over and women at risk for fractures or osteoporosis should discuss screening with their health care providers. Ask your health care provider whether you should take a calcium supplement or vitamin D to reduce the rate of  osteoporosis. Menopause can be associated with physical symptoms and risks. Hormone replacement therapy is available to decrease symptoms and risks. You should talk to your health care provider about whether hormone replacement therapy is right for you. Use sunscreen. Apply sunscreen liberally and repeatedly throughout the day. You should seek shade when your shadow is shorter than you. Protect yourself by wearing long sleeves, pants, a wide-brimmed hat, and sunglasses year round, whenever you are outdoors. Once a month, do a whole body skin exam, using a mirror to look at the skin on your back. Tell your health care provider of new moles, moles that have irregular borders, moles that are larger than a pencil eraser, or moles that have changed in shape or color. Stay current with required vaccines (immunizations). Influenza vaccine. All adults should be immunized every year. Tetanus, diphtheria, and acellular pertussis (Td, Tdap) vaccine. Pregnant women should receive 1 dose of Tdap vaccine during each pregnancy. The dose should be obtained regardless of the length of time since the last dose. Immunization is preferred during the 27th-36th week of gestation. An adult who has not previously received Tdap or who does not know her vaccine status should receive 1 dose of Tdap. This initial dose should be followed by tetanus and diphtheria toxoids (Td) booster doses every 10 years. Adults with an unknown or incomplete history of completing a 3-dose immunization series with Td-containing vaccines should begin or complete a primary immunization series including a Tdap dose. Adults should receive a Td booster every 10 years.  Zoster vaccine. One dose is recommended for adults aged 60 years or older unless certain conditions are present.  Pneumococcal 13-valent conjugate (PCV13) vaccine. When indicated, a person who is uncertain of her immunization history and has no record of immunization should receive the PCV13  vaccine. An adult aged 19 years or older who has certain medical conditions and has not been previously immunized should receive 1 dose of PCV13 vaccine. This PCV13 should be followed with a dose of pneumococcal polysaccharide (PPSV23) vaccine. The PPSV23   vaccine dose should be obtained at least 1 or more year(s) after the dose of PCV13 vaccine. An adult aged 19 years or older who has certain medical conditions and previously received 1 or more doses of PPSV23 vaccine should receive 1 dose of PCV13. The PCV13 vaccine dose should be obtained 1 or more years after the last PPSV23 vaccine dose.  Pneumococcal polysaccharide (PPSV23) vaccine. When PCV13 is also indicated, PCV13 should be obtained first. All adults aged 65 years and older should be immunized. An adult younger than age 65 years who has certain medical conditions should be immunized. Any person who resides in a nursing home or long-term care facility should be immunized. An adult smoker should be immunized. People with an immunocompromised condition and certain other conditions should receive both PCV13 and PPSV23 vaccines. People with human immunodeficiency virus (HIV) infection should be immunized as soon as possible after diagnosis. Immunization during chemotherapy or radiation therapy should be avoided. Routine use of PPSV23 vaccine is not recommended for American Indians, [place] Natives, or people younger than 65 years unless there are medical conditions that require PPSV23 vaccine. When indicated, people who have unknown immunization and have no record of immunization should receive PPSV23 vaccine. One-time revaccination 5 years after the first dose of PPSV23 is recommended for people aged 19-64 years who have chronic kidney failure, nephrotic syndrome, asplenia, or immunocompromised conditions. People who received 1-2 doses of PPSV23 before age 65 years should receive another dose of PPSV23 vaccine at age 65 years or later if at least 5 years have  passed since the previous dose. Doses of PPSV23 are not needed for people immunized with PPSV23 at or after age 65 years.  Preventive Services / Frequency  Ages 65 years and over Blood pressure check. Lipid and cholesterol check. Lung cancer screening. / Every year if you are aged 55-80 years and have a 30-pack-year history of smoking and currently smoke or have quit within the past 15 years. Yearly screening is stopped once you have quit smoking for at least 15 years or develop a health problem that would prevent you from having lung cancer treatment. Clinical breast exam.** / Every year after age 40 years.  BRCA-related cancer risk assessment.** / For women who have family members with a BRCA-related cancer (breast, ovarian, tubal, or peritoneal cancers). Mammogram.** / Every year beginning at age 40 years and continuing for as long as you are in good health. Consult with your health care provider. Pap test.** / Every 3 years starting at age 30 years through age 65 or 70 years with 3 consecutive normal Pap tests. Testing can be stopped between 65 and 70 years with 3 consecutive normal Pap tests and no abnormal Pap or HPV tests in the past 10 years. Fecal occult blood test (FOBT) of stool. / Every year beginning at age 50 years and continuing until age 75 years. You may not need to do this test if you get a colonoscopy every 10 years. Flexible sigmoidoscopy or colonoscopy.** / Every 5 years for a flexible sigmoidoscopy or every 10 years for a colonoscopy beginning at age 50 years and continuing until age 75 years. Hepatitis C blood test.** / For all people born from 1945 through 1965 and any individual with known risks for hepatitis C. Osteoporosis screening.** / A one-time screening for women ages 65 years and over and women at risk for fractures or osteoporosis. Skin self-exam. / Monthly. Influenza vaccine. / Every year. Tetanus, diphtheria, and acellular pertussis (Tdap/Td) vaccine.** /   year. Tetanus, diphtheria, and acellular pertussis (Tdap/Td) vaccine.** / 1 dose  of Td every 10 years. Zoster vaccine.** / 1 dose for adults aged 47 years or older. Pneumococcal 13-valent conjugate (PCV13) vaccine.** / Consult your health care provider. Pneumococcal polysaccharide (PPSV23) vaccine.** / 1 dose for all adults aged 58 years and older. Screening for abdominal aortic aneurysm (AAA)  by ultrasound is recommended for people who have history of high blood pressure or who are current or former smokers. ++++++++++++++++++++ Recommend Adult Low Dose Aspirin or  coated  Aspirin 81 mg daily  To reduce risk of Colon Cancer 40 %,  Skin Cancer 26 % ,  Melanoma 46%  and  Pancreatic cancer 60% ++++++++++++++++++++ Vitamin D goal  is between 70-100.  Please make sure that you are taking your Vitamin D as directed.  It is very important as a natural anti-inflammatory  helping hair, skin, and nails, as well as reducing stroke and heart attack risk.  It helps your bones and helps with mood. It also decreases numerous cancer risks so please take it as directed.  Low Vit D is associated with a 200-300% higher risk for CANCER  and 200-300% higher risk for HEART   ATTACK  &  STROKE.   .....................................[name] It is also associated with higher death rate at younger ages,  autoimmune diseases like Rheumatoid arthritis, Lupus, Multiple Sclerosis.    Also many other serious conditions, like depression, Alzheimer's Dementia, infertility, muscle aches, fatigue, fibromyalgia - just to name a few. ++++++++++++++++++ Recommend the book "The END of DIETING" by Dr Excell Seltzer  & the book "The END of DIABETES " by Dr Excell Seltzer At [email or web address] - get book & Audio CD's    Being diabetic has a  300% increased risk for heart attack, stroke, cancer, and alzheimer- type vascular dementia. It is very important that you work harder with diet by avoiding all foods that are white. Avoid white rice (brown & wild rice is OK), white potatoes (sweetpotatoes in moderation is OK),  White bread or wheat bread or anything made out of white flour like bagels, donuts, rolls, buns, biscuits, cakes, pastries, cookies, pizza crust, and pasta (made from white flour & egg whites) - vegetarian pasta or spinach or wheat pasta is OK. Multigrain breads like Arnold's or [employer], or multigrain sandwich thins or flatbreads.  Diet, exercise and weight loss can reverse and cure diabetes in the early stages.  Diet, exercise and weight loss is very important in the control and prevention of complications of diabetes which affects every system in your body, ie. Brain - dementia/stroke, eyes - glaucoma/blindness, heart - heart attack/heart failure, kidneys - dialysis, stomach - gastric paralysis, intestines - malabsorption, nerves - severe painful neuritis, circulation - gangrene & loss of a leg(s), and finally cancer and Alzheimers.    I recommend avoid fried & greasy foods,  sweets/candy, white rice (brown or wild rice or Quinoa is OK), white potatoes (sweet potatoes are OK) - anything made from white flour - bagels, doughnuts, rolls, buns, biscuits,white and wheat breads, pizza crust and traditional pasta made of white flour & egg white(vegetarian pasta or spinach or wheat pasta is OK).  Multi-grain bread is OK - like multi-grain flat bread or sandwich thins. Avoid alcohol in excess. Exercise is also important.    Eat all the vegetables you want - avoid meat, especially red meat and dairy - especially cheese.  Cheese is the most concentrated form of trans-fats which is the worst thing  to clog up our arteries. Veggie cheese is OK which can be found in the fresh produce section at [employer] or [employer] or [employer]  +++++++++++++++++++ DASH Eating Plan  DASH stands for "Dietary Approaches to Stop Hypertension."   The DASH eating plan is a healthy eating plan that has been shown to reduce high blood pressure (hypertension). Additional health benefits may include reducing the risk of type 2  diabetes mellitus, heart disease, and stroke. The DASH eating plan may also help with weight loss. WHAT DO I NEED TO KNOW ABOUT THE DASH EATING PLAN? For the DASH eating plan, you will follow these general guidelines: Choose foods with a percent daily value for sodium of less than 5% (as listed on the food label). Use salt-free seasonings or herbs instead of table salt or sea salt. Check with your health care provider or pharmacist before using salt substitutes. Eat lower-sodium products, often labeled as "lower sodium" or "no salt added." Eat fresh foods. Eat more vegetables, fruits, and low-fat dairy products. Choose whole grains. Look for the word "whole" as the first word in the ingredient list. Choose fish  Limit sweets, desserts, sugars, and sugary drinks. Choose heart-healthy fats. Eat veggie cheese  Eat more home-cooked food and less restaurant, buffet, and fast food. Limit fried foods. Cook foods using methods other than frying. Limit canned vegetables. If you do use them, rinse them well to decrease the sodium. When eating at a restaurant, ask that your food be prepared with less salt, or no salt if possible.                      WHAT FOODS CAN I EAT? Read Dr Fara Olden Fuhrman's books on The End of Dieting & The End of Diabetes  Grains Whole grain or whole wheat bread. Brown rice. Whole grain or whole wheat pasta. Quinoa, bulgur, and whole grain cereals. Low-sodium cereals. Corn or whole wheat flour tortillas. Whole grain cornbread. Whole grain crackers. Low-sodium crackers.  Vegetables Fresh or frozen vegetables (raw, steamed, roasted, or grilled). Low-sodium or reduced-sodium tomato and vegetable juices. Low-sodium or reduced-sodium tomato sauce and paste. Low-sodium or reduced-sodium canned vegetables.   Fruits All fresh, canned (in natural juice), or frozen fruits.  Protein Products  All fish and seafood.  Dried beans, peas, or lentils. Unsalted nuts and seeds. Unsalted  canned beans.  Dairy Low-fat dairy products, such as skim or 1% milk, 2% or reduced-fat cheeses, low-fat ricotta or cottage cheese, or plain low-fat yogurt. Low-sodium or reduced-sodium cheeses.  Fats and Oils Tub margarines without trans fats. Light or reduced-fat mayonnaise and salad dressings (reduced sodium). Avocado. Safflower, olive, or canola oils. Natural peanut or almond butter.  Other Unsalted popcorn and pretzels. The items listed above may not be a complete list of recommended foods or beverages. Contact your dietitian for more options.  +++++++++++++++  WHAT FOODS ARE NOT RECOMMENDED? Grains/ White flour or wheat flour White bread. White pasta. White rice. Refined cornbread. Bagels and croissants. Crackers that contain trans fat.  Vegetables  Creamed or fried vegetables. Vegetables in a . Regular canned vegetables. Regular canned tomato sauce and paste. Regular tomato and vegetable juices.  Fruits Dried fruits. Canned fruit in light or heavy syrup. Fruit juice.  Meat and Other Protein Products Meat in general - RED meat & White meat.  Fatty cuts of meat. Ribs, chicken wings, all processed meats as bacon, sausage, bologna, salami, fatback, hot dogs, bratwurst and packaged luncheon meats.  Dairy  Whole-fat or sweetened yogurt. Full-fat cheeses or blue cheese. Non-dairy creamers and whipped toppings. Processed cheese, cheese spreads, or cheese curds.  Condiments Onion and garlic salt, seasoned salt, table salt, and sea salt. Canned and packaged gravies. Worcestershire sauce. Tartar sauce. Barbecue sauce. Teriyaki sauce. Soy sauce, including reduced sodium. Steak sauce. Fish sauce. Oyster sauce. Cocktail sauce. Horseradish. Ketchup and mustard. Meat flavorings and tenderizers. Bouillon cubes. Hot sauce. Tabasco sauce. Marinades. Taco seasonings. Relishes.  Fats and Oils Butter, stick margarine, lard, shortening and  bacon fat. Coconut, palm kernel, or palm oils. Regular salad dressings.  Pickles and olives. Salted popcorn and pretzels.  The items listed above may not be a complete list of foods and beverages to avoid.   

## 2021-12-14 ENCOUNTER — Other Ambulatory Visit: Payer: Self-pay | Admitting: Internal Medicine

## 2021-12-14 LAB — URINALYSIS, ROUTINE W REFLEX MICROSCOPIC
Bacteria, UA: NONE SEEN /HPF
Bilirubin Urine: NEGATIVE
Glucose, UA: NEGATIVE
Hgb urine dipstick: NEGATIVE
Hyaline Cast: NONE SEEN /LPF
Ketones, ur: NEGATIVE
Nitrite: NEGATIVE
Protein, ur: NEGATIVE
RBC / HPF: NONE SEEN /HPF (ref 0–2)
Specific Gravity, Urine: 1.006 (ref 1.001–1.035)
WBC, UA: NONE SEEN /HPF (ref 0–5)
pH: 7 (ref 5.0–8.0)

## 2021-12-14 LAB — COMPLETE METABOLIC PANEL WITH GFR
AG Ratio: 1.7 (calc) (ref 1.0–2.5)
ALT: 11 U/L (ref 6–29)
AST: 17 U/L (ref 10–35)
Albumin: 4.1 g/dL (ref 3.6–5.1)
Alkaline phosphatase (APISO): 79 U/L (ref 37–153)
BUN: 17 mg/dL (ref 7–25)
CO2: 30 mmol/L (ref 20–32)
Calcium: 9.6 mg/dL (ref 8.6–10.4)
Chloride: 99 mmol/L (ref 98–110)
Creat: 0.73 mg/dL (ref 0.60–0.95)
Globulin: 2.4 g/dL (calc) (ref 1.9–3.7)
Glucose, Bld: 91 mg/dL (ref 65–99)
Potassium: 4.5 mmol/L (ref 3.5–5.3)
Sodium: 135 mmol/L (ref 135–146)
Total Bilirubin: 0.4 mg/dL (ref 0.2–1.2)
Total Protein: 6.5 g/dL (ref 6.1–8.1)
eGFR: 80 mL/min/{1.73_m2} (ref >=60)

## 2021-12-14 LAB — CBC WITH DIFFERENTIAL/PLATELET
Absolute Monocytes: 466 cells/uL (ref 200–950)
Basophils Absolute: 58 cells/uL (ref 0–200)
Basophils Relative: 1.1 %
Eosinophils Absolute: 260 cells/uL (ref 15–500)
Eosinophils Relative: 4.9 %
HCT: 36.3 % (ref 35.0–45.0)
Hemoglobin: 12.1 g/dL (ref 11.7–15.5)
Lymphs Abs: 1171 cells/uL (ref 850–3900)
MCH: 30.3 pg (ref 27.0–33.0)
MCHC: 33.3 g/dL (ref 32.0–36.0)
MCV: 90.8 fL (ref 80.0–100.0)
MPV: 11.3 fL (ref 7.5–12.5)
Monocytes Relative: 8.8 %
Neutro Abs: 3344 cells/uL (ref 1500–7800)
Neutrophils Relative %: 63.1 %
Platelets: 257 10*3/uL (ref 140–400)
RBC: 4 10*6/uL (ref 3.80–5.10)
RDW: 13.7 % (ref 11.0–15.0)
Total Lymphocyte: 22.1 %
WBC: 5.3 10*3/uL (ref 3.8–10.8)

## 2021-12-14 LAB — HEMOGLOBIN A1C
Hgb A1c MFr Bld: 5.5 % of total Hgb (ref <5.7)
Mean Plasma Glucose: 111 mg/dL
eAG (mmol/L): 6.2 mmol/L

## 2021-12-14 LAB — VITAMIN D 25 HYDROXY (VIT D DEFICIENCY, FRACTURES): Vit D, 25-Hydroxy: 54 ng/mL (ref 30–100)

## 2021-12-14 LAB — LIPID PANEL
Cholesterol: 224 mg/dL — ABNORMAL HIGH (ref <200)
HDL: 70 mg/dL (ref >=50)
LDL Cholesterol (Calc): 129 mg/dL (calc) — ABNORMAL HIGH
Non-HDL Cholesterol (Calc): 154 mg/dL (calc) — ABNORMAL HIGH (ref <130)
Total CHOL/HDL Ratio: 3.2 (calc) (ref <5.0)
Triglycerides: 140 mg/dL (ref <150)

## 2021-12-14 LAB — MICROALBUMIN / CREATININE URINE RATIO
Creatinine, Urine: 18 mg/dL — ABNORMAL LOW (ref 20–275)
Microalb, Ur: <0.2 mg/dL

## 2021-12-14 LAB — MAGNESIUM: Magnesium: 1.8 mg/dL (ref 1.5–2.5)

## 2021-12-14 LAB — MICROSCOPIC MESSAGE

## 2021-12-14 LAB — TSH: TSH: 1.97 mIU/L (ref 0.40–4.50)

## 2021-12-14 LAB — INSULIN, RANDOM: Insulin: 8.1 u[IU]/mL

## 2021-12-14 NOTE — Progress Notes (Signed)
<><><><><><><><><><><><><><><><><><><><><><><><><><><><><><><><><> <><><><><><><><><><><><><><><><><><><><><><><><><><><><><><><><><> - Test results slightly outside the reference range are not unusual. If there is anything important, I will review this with you,  otherwise it is considered normal test values.  If you have further questions,  please do not hesitate to contact me at the office or via My Chart.  <><><><><><><><><><><><><><><><><><><><><><><><><><><><><><><><><> <><><><><><><><><><><><><><><><><><><><><><><><><><><><><><><><><>  - Total Chol = 224   is very high risk for Heart Attack /Stroke /Vascular Dementia     ( Ideal or Goal is less than 180 ! )  & - Bad /Dangerous LDL Chol =   129    - - >> Sitting on a time Bomb !     ( Ideal or Goal is less than 70 ! )   - Treating with meds to lower Cholesterol is treating the result                                          & NOT treating the cause  - The cause is Bad Diet !  - But need to go ahead & start meds until get on a better diet to try &                                                          reverse some of the Damage already done   - Read or listen to   Dr Wyman Songster 's book    " How Not to Die ! "    - Recommend a stricter plant based low cholesterol diet   - Cholesterol only comes from animal sources                                                                 - ie. meat, dairy, egg yolks  - Eat all the vegetables you want.  - Avoid Meat, Avoid Meat , Avoid Meat  ! ! !                                                  -especially red meat - Beef AND Pork  - Avoid cheese & dairy - milk & ice cream.   - Cheese is the most concentrated form of trans-fats which                                                    is the worst thing to clog up our arteries.   - Veggie cheese is OK which can be found in  the fresh produce section at                                                          [facility] or [employer] or [employer] ============================================================ ============================================================   - As a last resort, you can take medicines if you refuse to improve your diet  <><><><><><><><><><><><><><><><><><><><><><><><><><><><><><><><><> <><><><><><><><><><><><><><><><><><><><><><><><><><><><><><><><><>  -  A1c  - is Normal - No Diabetes  - Great  !  <><><><><><><><><><><><><><><><><><><><><><><><><><><><><><><><><>  -  Vitamin D= 54 - OK  <><><><><><><><><><><><><><><><><><><><><><><><><><><><><><><><><>  -  All Else - CBC - Kidneys - Electrolytes - Liver - Magnesium & Thyroid    - all  Normal / OK <><><><><><><><><><><><><><><><><><><><><><><><><><><><><><><><><> <><><><><><><><><><><><><><><><><><><><><><><><><><><><><><><><><>

## 2021-12-16 ENCOUNTER — Encounter: Payer: Self-pay | Admitting: Internal Medicine

## 2022-01-09 ENCOUNTER — Ambulatory Visit: Payer: Medicare Other

## 2022-01-10 ENCOUNTER — Encounter: Payer: Self-pay | Admitting: Internal Medicine

## 2022-01-11 ENCOUNTER — Ambulatory Visit (INDEPENDENT_AMBULATORY_CARE_PROVIDER_SITE_OTHER): Payer: Medicare Other | Admitting: *Deleted

## 2022-01-11 VITALS — BP 170/70 | HR 52 | Temp 97.6°F | Resp 16 | Ht 60.0 in | Wt 126.4 lb

## 2022-01-11 DIAGNOSIS — J4540 Moderate persistent asthma, uncomplicated: Secondary | ICD-10-CM | POA: Diagnosis not present

## 2022-01-11 MED ORDER — OMALIZUMAB 150 MG/ML ~~LOC~~ SOSY
150.0000 mg | PREFILLED_SYRINGE | Freq: Once | SUBCUTANEOUS | Status: AC
  Administered 2022-01-11: 150 mg via SUBCUTANEOUS
  Filled 2022-01-11: qty 1

## 2022-01-11 NOTE — Progress Notes (Signed)
Diagnosis: Asthma  Provider:  Marshell Garfinkel MD  Procedure: Injection  Xolair (Omalizumab), Dose: 150 mg, Site: subcutaneous, Number of injections: 1  Post Care: Observation period completed  Discharge: Condition: Good, Destination: Home . AVS provided to patient.   Performed by:  Oren Beckmann, RN

## 2022-01-15 ENCOUNTER — Ambulatory Visit (INDEPENDENT_AMBULATORY_CARE_PROVIDER_SITE_OTHER): Payer: Medicare Other | Admitting: Internal Medicine

## 2022-01-15 ENCOUNTER — Encounter: Payer: Self-pay | Admitting: Internal Medicine

## 2022-01-15 VITALS — BP 110/60 | HR 60 | Temp 97.6°F | Resp 16 | Ht 60.0 in | Wt 129.8 lb

## 2022-01-15 DIAGNOSIS — R12 Heartburn: Secondary | ICD-10-CM

## 2022-01-15 MED ORDER — PANTOPRAZOLE SODIUM 40 MG PO TBEC: DELAYED_RELEASE_TABLET | ORAL | 3 refills | Status: DC

## 2022-01-15 NOTE — Patient Instructions (Signed)
(1)STOP  Ibuprofen   (2) Recommend take Tylenol  ( Acetaminophen )  500 mg tablets                                                      x 2 tablets = 1,000 mg  4 x/day  with Meals & Bedtime  for Pain   (3) Recommend eat Oatmeal 2  x/day to soak up excess acid &                                                         coat the raw lining of Stomach ===============================  Heartburn Heartburn is a type of pain or discomfort that can happen in the throat or chest. It is often described as a burning pain. It may also cause a bad, acid-like taste in the mouth. Heartburn may feel worse when you lie down or bend over, and it is often worse at night. Heartburn may be caused by stomach contents that move back up into the esophagus (reflux). Follow these instructions at home: Eating and drinking Avoid certain foods and drinks as told by your health care provider. This may include: Coffee and tea, with or without caffeine. Drinks that contain alcohol. Energy drinks and sports drinks. Carbonated drinks or sodas. Chocolate and cocoa. Peppermint and mint flavorings. Garlic and onions. Horseradish. Spicy and acidic foods, including peppers, chili powder, curry powder, vinegar, hot sauces, and barbecue sauce. Citrus fruit juices and citrus fruits, such as oranges, lemons, and limes. Tomato-based foods, such as red sauce, chili, salsa, and pizza with red sauce. Fried and fatty foods, such as donuts, french fries, potato chips, and high-fat dressings. High-fat meats, such as hot dogs and fatty cuts of red and white meats, such as rib eye steak, sausage, ham, and bacon. High-fat dairy items, such as whole milk, butter, and cream cheese. Eat small, frequent meals instead of large meals. Avoid drinking large amounts of liquid with your meals. Avoid eating meals during the 2-3 hours before bedtime. Avoid lying down right after you eat. Do not exercise right after you eat. Lifestyle  If you  are overweight, reduce your weight to an amount that is healthy for you. Ask your health care provider for guidance about a safe weight loss goal. Do not use any products that contain nicotine or tobacco. These products include cigarettes, chewing tobacco, and vaping devices, such as e-cigarettes. These can make symptoms worse. If you need help quitting, ask your health care provider. Wear loose-fitting clothing. Do not wear anything tight around your waist that causes pressure on your abdomen. Raise (elevate) the head of your bed about 6 inches (15 cm) when you sleep. You can use a wedge to do this. Try to reduce your stress, such as with yoga or meditation. If you need help reducing stress, ask your health care provider. Medicines Take over-the-counter and prescription medicines only as told by your health care provider. Do not take aspirin or NSAIDs, such as ibuprofen, unless your health care provider told you to do so. Stop medicines only as told by your health care provider. If you stop taking some medicines too quickly, your  symptoms may get worse. General instructions Pay attention to any changes in your symptoms. Keep all follow-up visits. This is important. Contact a health care provider if: You have new symptoms. You have unexplained weight loss. You have difficulty swallowing, or it hurts to swallow. You have wheezing or a persistent cough. Your symptoms do not improve with treatment. You have frequent heartburn for more than 2 weeks. Get help right away if: You suddenly have pain in your arms, neck, jaw, teeth, or back. You suddenly feel sweaty, dizzy, or light-headed. You have chest pain or shortness of breath. You vomit and your vomit looks like blood or coffee grounds. Your stool is bloody or black. These symptoms may represent a serious problem that is an emergency. Do not wait to see if the symptoms will go away. Get medical help right away. Call your local emergency  services (911 in the [place] Do not drive yourself to the hospital. Summary Heartburn is a type of pain or discomfort that can happen in the throat or chest. It is often described as a burning pain. It may also cause a bad, acid-like taste in the mouth. Avoid certain foods and drinks as told by your health care provider. Take over-the-counter and prescription medicines only as told by your health care provider. Do not take aspirin or NSAIDs, such as ibuprofen, unless your health care provider told you to do so. Contact a health care provider if your symptoms do not improve or they get worse. ===================================== Food Choices for Gastroesophageal Reflux Disease, Adult When you have gastroesophageal reflux disease (GERD), the foods you eat and your eating habits are very important. Choosing the right foods can help ease the discomfort of GERD. Consider working with a dietitian to help you make healthy food choices. What are tips for following this plan? Reading food labels Look for foods that are low in saturated fat. Foods that have less than 5% of daily value (DV) of fat and 0 g of trans fats may help with your symptoms. Cooking Cook foods using methods other than frying. This may include baking, steaming, grilling, or broiling. These are all methods that do not need a lot of fat for cooking. To add flavor, try to use herbs that are low in spice and acidity. Meal planning  Choose healthy foods that are low in fat, such as fruits, vegetables, whole grains, low-fat dairy products, lean meats, fish, and poultry. Eat frequent, small meals instead of three large meals each day. Eat your meals slowly, in a relaxed setting. Avoid bending over or lying down until 2-3 hours after eating. Limit high-fat foods such as fatty meats or fried foods. Limit your intake of fatty foods, such as oils, butter, and shortening. Avoid the following as told by your health care provider: Foods that cause  symptoms. These may be different for different people. Keep a food diary to keep track of foods that cause symptoms. Alcohol. Drinking large amounts of liquid with meals. Eating meals during the 2-3 hours before bed. Lifestyle Maintain a healthy weight. Ask your health care provider what weight is healthy for you. If you need to lose weight, work with your health care provider to do so safely. Exercise for at least 30 minutes on 5 or more days each week, or as told by your health care provider. Avoid wearing clothes that fit tightly around your waist and chest. Do not use any products that contain nicotine or tobacco. These products include cigarettes, chewing tobacco, and vaping  devices, such as e-cigarettes. If you need help quitting, ask your health care provider. Sleep with the head of your bed raised. Use a wedge under the mattress or blocks under the bed frame to raise the head of the bed. Chew sugar-free gum after mealtimes. What foods should I eat?  Eat a healthy, well-balanced diet of fruits, vegetables, whole grains, low-fat dairy products, lean meats, fish, and poultry. Each person is different. Foods that may trigger symptoms in one person may not trigger any symptoms in another person. Work with your health care provider to identify foods that are safe for you. The items listed above may not be a complete list of recommended foods and beverages. Contact a dietitian for more information. What foods should I avoid? Limiting some of these foods may help manage the symptoms of GERD. Everyone is different. Consult a dietitian or your health care provider to help you identify the exact foods to avoid, if any. Fruits Any fruits prepared with added fat. Any fruits that cause symptoms. For some people this may include citrus fruits, such as oranges, grapefruit, pineapple, and lemons. Vegetables Deep-fried vegetables. [place] fries. Any vegetables prepared with added fat. Any vegetables that  cause symptoms. For some people, this may include tomatoes and tomato products, chili peppers, onions and garlic, and horseradish. Grains Pastries or quick breads with added fat. Meats and other proteins High-fat meats, such as fatty beef or pork, hot dogs, ribs, ham, sausage, salami, and bacon. Fried meat or protein, including fried fish and fried chicken. Nuts and nut butters, in large amounts. Dairy Whole milk and chocolate milk. Sour cream. Cream. Ice cream. Cream cheese. Milkshakes. Fats and oils Butter. Margarine. Shortening. Ghee. Beverages Coffee and tea, with or without caffeine. Carbonated beverages. Sodas. Energy drinks. Fruit juice made with acidic fruits, such as orange or grapefruit. Tomato juice. Alcoholic drinks. Sweets and desserts Chocolate and cocoa. Donuts. Seasonings and condiments Pepper. Peppermint and spearmint. Added salt. Any condiments, herbs, or seasonings that cause symptoms. For some people, this may include curry, hot sauce, or vinegar-based salad dressings. The items listed above may not be a complete list of foods and beverages to avoid. Contact a dietitian for more information. Questions to ask your health care provider Diet and lifestyle changes are usually the first steps that are taken to manage symptoms of GERD. If diet and lifestyle changes do not improve your symptoms, talk with your health care provider about taking medicines. Where to find more information [employer]: aboutgerd.org Summary When you have gastroesophageal reflux disease (GERD), food and lifestyle choices may be very helpful in easing the discomfort of GERD. Eat frequent, small meals instead of three large meals each day. Eat your meals slowly, in a relaxed setting. Avoid bending over or lying down until 2-3 hours after eating. Limit high-fat foods such as fatty meats or fried foods. This information is not intended to replace advice given to  you by your health care provider. Make sure you discuss any questions you have with your health care provider. Document Revised: 08/02/2019 Document Reviewed: 08/02/2019 Elsevier Patient Education  [place].

## 2022-01-15 NOTE — Progress Notes (Signed)
Future Appointments  Date Time Provider Department  01/15/2022  3:30 PM [name], MD [facility]  04/22/2022 11:00 AM [name], NP [facility]  08/16/2022 11:30 AM [name], MD [facility]  12/19/2022  3:00 PM [name], MD [facility]    History of Present Illness:        This very nice 88 y.o. MWF with  HTN, HLD, Prediabetes, Allergic Asthma  and Vitamin D Deficiency  who presents with concerns of possible peptic ulcer . She's c/o 2-3 week hx/o post prandial mid abdominal discomfort - burning & bloatin  & has =taken Dicyclomine w/o relief.  She had been on Meloxicam for arthritic pains in her hans, but has switched to Ibuprofen 200 mg x 8 tabs most days . Occasional the epigastric pain /discomfort awakens heart night . Tums help.  Some nausea  / No vomiting.    Medications    levothyroxine  75 MCG tablet, TAKE AS INSTRUCTED BY YOUR PRESCRIBER   bisoprolol-hctz 5-6.25 MG tablet, TAKE 1 TABLET DAILY FOR BLOOD PRESSURE   budesonide (PULMICORT) 0.25 MG/2ML nebulizer solution TWICE  DAILY   formoterol (PERFOROMIST) 20 MCG/2ML neb  soln, USE 1 VIAL  IN  NEBULIZER TWICE  DAILY   Ipratropium-Albuterol  20-100 respimat, USE 1 INHALATION EVERY 6 HOURS AS NEEDED   loratadine 10 MG tablet, Take 1 tablet daily as needed for allergies.   omalizumab  injection 150 mg   aspirin EC 81 MG tablet, Take  daily.    meloxicam (MOBIC) 15 MG tablet, Take 1/2 to 1 tablet Daily with Food  for Pain & Inflammation    traMADol (ULTRAM) 50 MG tablet, Take 1 tablet every 6 (six) hours as needed.   Ascorbic Acid (VITAMIN C PO), Take 500 mg daily.   VITAMIN D    5,000 Units , Take  daily.   meclizine (ANTIVERT) 25 MG tablet, Take 1/2 to 1 tablet 2 to 3 x /day as needed for Dizziness /Vertigo   sertraline (ZOLOFT) 50 MG tablet, Take  1 tablet  Daily     zinc  50 MG tablet, Take 50 mg by mouth daily.   Problem list She has GERD; Abnormal glucose; Vitamin D deficiency;  Hypothyroidism; Hyperlipidemia, mixed; Essential hypertension; Medication management; BMI 24.0-24.9, adult; Encounter for Medicare annual wellness exam; Vertigo; Hearing loss; Depression, major, recurrent, in partial remission ([place]); Osteoporosis; Extrinsic asthma, moderate persistent, uncomplicated; Labile hypertension; Malignant neoplasm of lower-outer quadrant of left breast of female, estrogen receptor positive ([place]); Family history of thyroid cancer; and Genetic testing on their problem list.   Observations/Objective:  BP 110/60   Pulse 60   Temp 97.6 F (36.4 C)   Resp 16   Ht 5' (1.524 m)   Wt 129 lb 12.8 oz (58.9 kg)   SpO2 98%   BMI 25.35 kg/m   HEENT - WNL. Neck - supple.  Chest - Clear equal BS. Cor - Nl HS. RRR w/o sig MGR. PP 1(+). No edema. Abd  - Soft with EG tenderness w/o rebound, masses or guarding.  MS- FROM w/o deformities.  Gait Nl. Neuro -  Nl w/o focal abnormalities.  Assessment and Plan:   1. Pyrosis  - pantoprazole  40 MG tablet;Take  1 tablet  Daily  to Prevent Heartburn &  Indigestion Dispense: 90 tablet; Refill: 3  - Discussed anti-dyspeptic diet    Follow Up Instructions:        I discussed the assessment and treatment plan with the patient.  The patient was provided an opportunity to ask questions and all were answered. The patient agreed with the plan and demonstrated an understanding of the instructions.       The patient was advised to call back or seek an in-person evaluation if the symptoms worsen or if the condition fails to improve as anticipated.    Kirtland Bouchard, MD

## 2022-01-31 ENCOUNTER — Telehealth: Payer: Self-pay | Admitting: Pharmacy Technician

## 2022-01-31 NOTE — Telephone Encounter (Signed)
Auth Submission: NO AUTH NEEDED Payer: Medicare a/b Medication & CPT/J Code(s) submitted: Xolair (Omalizumab) U0233   Auth type: Buy/Bill Units/visits requested: '150mg'$  q4 weeks Approval from: 02/03/22 to 02/04/23 at [facility] as Buy/Bill

## 2022-02-05 DIAGNOSIS — M25661 Stiffness of right knee, not elsewhere classified: Secondary | ICD-10-CM | POA: Diagnosis not present

## 2022-02-05 DIAGNOSIS — R262 Difficulty in walking, not elsewhere classified: Secondary | ICD-10-CM | POA: Diagnosis not present

## 2022-02-05 DIAGNOSIS — R531 Weakness: Secondary | ICD-10-CM | POA: Diagnosis not present

## 2022-02-05 DIAGNOSIS — M1711 Unilateral primary osteoarthritis, right knee: Secondary | ICD-10-CM | POA: Diagnosis not present

## 2022-02-07 DIAGNOSIS — U071 COVID-19: Secondary | ICD-10-CM | POA: Diagnosis not present

## 2022-02-07 DIAGNOSIS — R509 Fever, unspecified: Secondary | ICD-10-CM | POA: Diagnosis not present

## 2022-02-07 DIAGNOSIS — R059 Cough, unspecified: Secondary | ICD-10-CM | POA: Diagnosis not present

## 2022-02-09 ENCOUNTER — Emergency Department (HOSPITAL_COMMUNITY): Payer: Medicare Other

## 2022-02-09 ENCOUNTER — Emergency Department (HOSPITAL_COMMUNITY)
Admission: EM | Admit: 2022-02-09 | Discharge: 2022-02-09 | Disposition: A | Discharge status: Home / Self Care | Payer: Medicare Other | Attending: Emergency Medicine | Admitting: Emergency Medicine

## 2022-02-09 ENCOUNTER — Other Ambulatory Visit: Payer: Self-pay

## 2022-02-09 ENCOUNTER — Encounter (HOSPITAL_COMMUNITY): Payer: Self-pay | Admitting: Pharmacy Technician

## 2022-02-09 DIAGNOSIS — R112 Nausea with vomiting, unspecified: Secondary | ICD-10-CM | POA: Diagnosis not present

## 2022-02-09 DIAGNOSIS — I10 Essential (primary) hypertension: Secondary | ICD-10-CM | POA: Diagnosis not present

## 2022-02-09 DIAGNOSIS — U071 COVID-19: Secondary | ICD-10-CM | POA: Insufficient documentation

## 2022-02-09 DIAGNOSIS — Z853 Personal history of malignant neoplasm of breast: Secondary | ICD-10-CM | POA: Diagnosis not present

## 2022-02-09 DIAGNOSIS — E039 Hypothyroidism, unspecified: Secondary | ICD-10-CM | POA: Insufficient documentation

## 2022-02-09 DIAGNOSIS — Z79899 Other long term (current) drug therapy: Secondary | ICD-10-CM | POA: Insufficient documentation

## 2022-02-09 DIAGNOSIS — J9811 Atelectasis: Secondary | ICD-10-CM | POA: Diagnosis not present

## 2022-02-09 DIAGNOSIS — Z7982 Long term (current) use of aspirin: Secondary | ICD-10-CM | POA: Insufficient documentation

## 2022-02-09 DIAGNOSIS — R509 Fever, unspecified: Secondary | ICD-10-CM | POA: Diagnosis not present

## 2022-02-09 DIAGNOSIS — R197 Diarrhea, unspecified: Secondary | ICD-10-CM | POA: Insufficient documentation

## 2022-02-09 DIAGNOSIS — R110 Nausea: Secondary | ICD-10-CM | POA: Diagnosis not present

## 2022-02-09 LAB — CBC WITH DIFFERENTIAL/PLATELET
Abs Immature Granulocytes: 0 10*3/uL (ref 0.00–0.07)
Basophils Absolute: 0 10*3/uL (ref 0.0–0.1)
Basophils Relative: 0 %
Eosinophils Absolute: 0 10*3/uL (ref 0.0–0.5)
Eosinophils Relative: 0 %
HCT: 35.8 % — ABNORMAL LOW (ref 36.0–46.0)
Hemoglobin: 11.6 g/dL — ABNORMAL LOW (ref 12.0–15.0)
Immature Granulocytes: 0 %
Lymphocytes Relative: 28 %
Lymphs Abs: 0.7 10*3/uL (ref 0.7–4.0)
MCH: 30.3 pg (ref 26.0–34.0)
MCHC: 32.4 g/dL (ref 30.0–36.0)
MCV: 93.5 fL (ref 80.0–100.0)
Monocytes Absolute: 0.3 10*3/uL (ref 0.1–1.0)
Monocytes Relative: 13 %
Neutro Abs: 1.6 10*3/uL — ABNORMAL LOW (ref 1.7–7.7)
Neutrophils Relative %: 59 %
Platelets: 223 10*3/uL (ref 150–400)
RBC: 3.83 MIL/uL — ABNORMAL LOW (ref 3.87–5.11)
RDW: 14.6 % (ref 11.5–15.5)
WBC: 2.7 10*3/uL — ABNORMAL LOW (ref 4.0–10.5)
nRBC: 0 % (ref 0.0–0.2)

## 2022-02-09 LAB — COMPREHENSIVE METABOLIC PANEL
ALT: 19 U/L (ref 0–44)
AST: 31 U/L (ref 15–41)
Albumin: 3.6 g/dL (ref 3.5–5.0)
Alkaline Phosphatase: 55 U/L (ref 38–126)
Anion gap: 9 (ref 5–15)
BUN: 5 mg/dL — ABNORMAL LOW (ref 8–23)
CO2: 26 mmol/L (ref 22–32)
Calcium: 8.5 mg/dL — ABNORMAL LOW (ref 8.9–10.3)
Chloride: 96 mmol/L — ABNORMAL LOW (ref 98–111)
Creatinine, Ser: 0.67 mg/dL (ref 0.44–1.00)
GFR, Estimated: >60 mL/min (ref >60)
Glucose, Bld: 92 mg/dL (ref 70–99)
Potassium: 3.6 mmol/L (ref 3.5–5.1)
Sodium: 131 mmol/L — ABNORMAL LOW (ref 135–145)
Total Bilirubin: 0.6 mg/dL (ref 0.3–1.2)
Total Protein: 6.5 g/dL (ref 6.5–8.1)

## 2022-02-09 LAB — LIPASE, BLOOD: Lipase: 29 U/L (ref 11–51)

## 2022-02-09 LAB — LACTIC ACID, PLASMA: Lactic Acid, Venous: 0.9 mmol/L (ref 0.5–1.9)

## 2022-02-09 LAB — MAGNESIUM: Magnesium: 1.7 mg/dL (ref 1.7–2.4)

## 2022-02-09 MED ORDER — MAGNESIUM SULFATE 2 GM/50ML IV SOLN
2.0000 g | Freq: Once | INTRAVENOUS | Status: AC
  Administered 2022-02-09: 2 g via INTRAVENOUS
  Filled 2022-02-09: qty 50

## 2022-02-09 MED ORDER — DIPHENHYDRAMINE HCL 50 MG/ML IJ SOLN
12.5000 mg | Freq: Once | INTRAMUSCULAR | Status: AC
  Administered 2022-02-09: 12.5 mg via INTRAVENOUS
  Filled 2022-02-09: qty 1

## 2022-02-09 MED ORDER — METOCLOPRAMIDE HCL 5 MG/ML IJ SOLN
10.0000 mg | Freq: Once | INTRAMUSCULAR | Status: AC
  Administered 2022-02-09: 10 mg via INTRAVENOUS
  Filled 2022-02-09: qty 2

## 2022-02-09 MED ORDER — METOCLOPRAMIDE HCL 10 MG PO TABS: 10.0000 mg | ORAL_TABLET | Freq: Four times a day (QID) | ORAL | 0 refills | Status: DC

## 2022-02-09 MED ORDER — LACTATED RINGERS IV BOLUS
1000.0000 mL | Freq: Once | INTRAVENOUS | Status: AC
  Administered 2022-02-09: 1000 mL via INTRAVENOUS

## 2022-02-09 NOTE — ED Provider Notes (Signed)
[facility] EMERGENCY DEPARTMENT Provider Note   CSN: [id number] Arrival date & time: 02/09/22  1419     History  No chief complaint on file.   Kathy Whitaker is a 89 y.o. female.  Past medical history of GERD, hypothyroidism, hyperlipidemia, hypertension, depression, breast cancer.  Diagnosed with COVID 4 days ago at urgent care.  Has had nausea, vomiting, diarrhea.  Has been unable to keep anything down for the past 4 days.  Anytime she tries to take anything orally she immediately throws it back up.  She feels very dehydrated, went to urgent care earlier today with concerns for dehydration.  They gave her 1 L of normal saline and 4 mg of IV Zofran.  She states that Zofran did not help at all with her nausea.  She still has been unable to keep anything down.  She has cough and mild shortness of breath.  She has been taking inhalers at home.  Additionally, she has been taking Paxlovid at home.  She has been having fevers, chills, and significant night sweats.  HPI     Home Medications Prior to Admission medications   Medication Sig Start Date End Date Taking? Authorizing Provider  metoCLOPramide (REGLAN) 10 MG tablet Take 1 tablet (10 mg total) by mouth every 6 (six) hours. 02/09/22  Yes Phyllis Ginger, MD  Ascorbic Acid (VITAMIN C PO) Take 500 mg by mouth daily.    [provider]  aspirin EC 81 MG tablet Take 81 mg by mouth daily.    [provider]  bisoprolol-hydrochlorothiazide [facility]) 5-6.25 MG tablet TAKE 1 TABLET DAILY FOR BLOOD PRESSURE 11/17/21   [name], MD  budesonide (PULMICORT) 0.25 MG/2ML nebulizer solution USE 1 VIAL  IN  NEBULIZER TWICE  DAILY - rinse mouth after treatment 08/29/21   [name], MD  Cholecalciferol (VITAMIN D PO) Take 5,000 Units by mouth daily.    [provider]  formoterol (PERFOROMIST) 20 MCG/2ML nebulizer solution USE 1 VIAL  IN  NEBULIZER TWICE  DAILY - morning and evening 08/27/21   [name], MD  Ipratropium-Albuterol (COMBIVENT RESPIMAT) 20-100 MCG/ACT AERS respimat USE 1 INHALATION EVERY 6 HOURS AS NEEDED FOR WHEEZING 01/31/15   [name], MD  levothyroxine (SYNTHROID) 75 MCG tablet TAKE AS INSTRUCTED BY YOUR PRESCRIBER 04/07/21   Liane Comber, NP  loratadine (CLARITIN) 10 MG tablet Take 1 tablet (10 mg total) by mouth daily as needed for allergies. 05/22/18   Garnet Sierras, NP  meclizine (ANTIVERT) 25 MG tablet Take 1/2 to 1 tablet 2 to 3 x /day as needed for Dizziness / Vertigo 10/19/21   [name], MD  pantoprazole (PROTONIX) 40 MG tablet Take  1 tablet  Daily  to Prevent Heartburn &  Indigestion 01/15/22   [name], MD  sertraline (ZOLOFT) 50 MG tablet Take  1 tablet  Daily  for mood. 05/03/21   [name], NP  zinc gluconate 50 MG tablet Take 50 mg by mouth daily.    [provider]      Allergies    Codeine, Propoxyphene n-acetaminophen, and Shellfish-derived products    Review of Systems   Review of Systems  Physical Exam Updated Vital Signs BP (!) 145/70 (BP Location: Right Arm)   Pulse (!) 55   Temp 98.2 F (36.8 C) (Oral)   Resp 15   SpO2 96%  Physical Exam Vitals and nursing note reviewed.  Constitutional:      General: She is  not in acute distress.    Appearance: She is well-developed. She is diaphoretic. She is not ill-appearing.  HENT:     Head: Normocephalic and atraumatic.     Mouth/Throat:     Mouth: Mucous membranes are dry.     Pharynx: Oropharynx is clear.  Eyes:     Conjunctiva/sclera: Conjunctivae normal.  Cardiovascular:     Rate and Rhythm: Normal rate and regular rhythm.     Heart sounds: No murmur heard. Pulmonary:     Effort: Pulmonary effort is normal. No respiratory distress.     Breath sounds: Normal breath sounds. No stridor. No wheezing, rhonchi or rales.  Abdominal:     General: Abdomen is flat.     Palpations: Abdomen is soft.     Tenderness: There is no abdominal tenderness. There is  no guarding or rebound.  Musculoskeletal:        General: No swelling.     Cervical back: Neck supple.     Right lower leg: No edema.     Left lower leg: No edema.  Skin:    General: Skin is warm.     Capillary Refill: Capillary refill takes less than 2 seconds.  Neurological:     General: No focal deficit present.     Mental Status: She is alert and oriented to person, place, and time.  Psychiatric:        Mood and Affect: Mood normal.        Behavior: Behavior normal.     ED Results / Procedures / Treatments   Labs (all labs ordered are listed, but only abnormal results are displayed) Labs Reviewed  CBC WITH DIFFERENTIAL/PLATELET - Abnormal; Notable for the following components:      Result Value   WBC 2.7 (*)    RBC 3.83 (*)    Hemoglobin 11.6 (*)    HCT 35.8 (*)    Neutro Abs 1.6 (*)    All other components within normal limits  COMPREHENSIVE METABOLIC PANEL - Abnormal; Notable for the following components:   Sodium 131 (*)    Chloride 96 (*)    BUN 5 (*)    Calcium 8.5 (*)    All other components within normal limits  LIPASE, BLOOD  MAGNESIUM  LACTIC ACID, PLASMA    EKG None  Radiology DG Chest 2 View  Result Date: 02/09/2022 CLINICAL DATA:  COVID positive with worsening nausea and diarrhea. EXAM: CHEST - 2 VIEW COMPARISON:  May 07, 2021 FINDINGS: The heart size and mediastinal contours are within normal limits. There is mild to moderate severity calcification of the aortic arch. Mild linear atelectasis is noted within the bilateral lung bases. There is no evidence of an acute infiltrate, pleural effusion or pneumothorax. Radiopaque surgical clips are seen within the soft tissues of the left breast. Mild dextroscoliosis of the midthoracic spine is noted. IMPRESSION: Mild bibasilar linear atelectasis. Electronically Signed   By: Virgina Norfolk M.D.   On: 02/09/2022 18:25    Procedures Procedures    Medications Ordered in ED Medications  lactated ringers  bolus 1,000 mL (0 mLs Intravenous Stopped 02/09/22 1920)  metoCLOPramide (REGLAN) injection 10 mg (10 mg Intravenous Given 02/09/22 1751)  magnesium sulfate IVPB 2 g 50 mL (0 g Intravenous Stopped 02/09/22 2020)  diphenhydrAMINE (BENADRYL) injection 12.5 mg (12.5 mg Intravenous Given 02/09/22 1919)    ED Course/ Medical Decision Making/ A&P  Medical Decision Making Problems Addressed: COVID: acute illness or injury Nausea, vomiting, and diarrhea: acute illness or injury that poses a threat to life or bodily functions  Amount and/or Complexity of Data Reviewed Independent Historian: caregiver and EMS    Details: Daughter-in-law Labs: ordered. Decision-making details documented in ED Course. Radiology: ordered and independent interpretation performed. Decision-making details documented in ED Course. ECG/medicine tests: ordered and independent interpretation performed. Decision-making details documented in ED Course.  Risk Prescription drug management.   This is a 89 y.o. female who presents to the emergency department with nausea, vomiting, diarrhea in the setting of known COVID infection. History is obtained from patient. Medical history reviewed in EMR.  Past medical/surgical history that increases complexity of ED encounter:  Past medical history of GERD, hypothyroidism, hyperlipidemia, hypertension, depression, breast cancer.  Initial Assessment:  Patient is diaphoretic, but vital signs are all within normal limits with the exception of mild bradycardia in the 50s.  Overall, fairly well-appearing but has had significant volume losses over the past 4 days.  Has been unable to keep anything down.  Concern for dehydration, AKI, electrolyte abnormality.  Abdominal exam is soft and benign, low concern for intra-abdominal process.  Initial Plan: Laboratory evaluation with CBC, CMP, lipase, lactic acid, magnesium Chest x-ray ECG 1 L LR and 10 mg IV Reglan since she was  given Zofran prior to arrival without resolution of her symptoms  Interpretation of Diagnostics: I personally reviewed the ECG, chest x-ray, and labs and my interpretation is as follows: Labs reviewed show normal electrolytes.  Magnesium was slightly low at 1.7, was given 2 g IV.  She is leukopenic which is expected in the setting of known COVID.  Hemoglobin is low but stable compared to prior.  Lipase normal.  Chest x-ray unremarkable.  ECG shows sinus rhythm and no signs of ischemia.  Normal QT intervals.  After receiving Reglan, she developed akathisia, which was treated with 12.5 mg of IV Benadryl.  She tolerated p.o. in the emergency department.  Responding well to Reglan, everything looks good on labs.  Believe she is stable for discharge home.  Recommend he continue to take her Paxlovid which she did take here in the emergency department.  Given prescription for Reglan.  She has family coming to be with her tomorrow to take care of her while she continues to be ill from her COVID infection, but at this time is stable for discharge home with PCP follow-up.  MDM generated using voice dictation software and may contain dictation errors. Please contact me for any clarification or with any questions.          Final Clinical Impression(s) / ED Diagnoses Final diagnoses:  Nausea, vomiting, and diarrhea  COVID    Rx / DC Orders ED Discharge Orders          Ordered    metoCLOPramide (REGLAN) 10 MG tablet  Every 6 hours        02/09/22 2011              Phyllis Ginger, MD 02/10/22 1660    Carmin Muskrat, MD 02/10/22 1651

## 2022-02-09 NOTE — ED Notes (Signed)
Pt dressed self and ambulated to restroom without assistance. Discharge instructions reviewed with pt.

## 2022-02-09 NOTE — ED Triage Notes (Signed)
Pt bib ems after testing + covid on Wednesday at [facility]. Pt now with worsening nausea and diarrhea. 20g LAC, given 1000cc NS en route.  164/80 96% RA 65 HR  CBG 89

## 2022-02-09 NOTE — ED Notes (Signed)
To x-ray

## 2022-02-09 NOTE — ED Notes (Signed)
The pt has covd just diagnosed since Wednesday   she had 2 days of feeling bad prior to that  she is c/o nausea diarrhea and feeling very bad  no pain anywhere  just nauseated now

## 2022-02-09 NOTE — ED Notes (Signed)
[phone] Marcie daughter in law would like to speak to someone about her care.

## 2022-02-09 NOTE — ED Provider Triage Note (Addendum)
Emergency Medicine Provider Triage Evaluation Note  Kathy Whitaker , a 89 y.o. female  was evaluated in triage.  Pt complains of feeling overall unwell.  Recently diagnosed with COVID.  Given antiviral, after 2 to 3 days of taking it noticed increased nausea, vomiting, diarrhea, and dehydration.  Received some fluids at a medical office prior to arrival.  Review of Systems  Positive:  Negative: See above  Physical Exam  BP (!) 155/70 (BP Location: Right Arm)   Pulse (!) 55   Temp 98.2 F (36.8 C) (Oral)   Resp 16   SpO2 95%  Gen:   Awake, no distress   Resp:  Normal effort  MSK:   Moves extremities without difficulty  Other:  Appears mildly clinically dehydrated.  Sitting comfortably.  Medical Decision Making  Medically screening exam initiated at 3:39 PM.  Appropriate orders placed.  Kathy Whitaker was informed that the remainder of the evaluation will be completed by another provider, this initial triage assessment does not replace that evaluation, and the importance of remaining in the ED until their evaluation is complete.      Prince Rome, PA-C 70/34/03 1542

## 2022-02-10 ENCOUNTER — Other Ambulatory Visit: Payer: Self-pay

## 2022-02-12 ENCOUNTER — Ambulatory Visit: Payer: Medicare Other

## 2022-02-13 ENCOUNTER — Telehealth: Payer: Self-pay

## 2022-02-13 NOTE — Telephone Encounter (Signed)
     Patient  visit on 02/09/2022  at [facility] was for Nausea, vomiting, and diarrhea.  Have you been able to follow up with your primary care physician? Spoke with patient's daughter-in-law who has been caring for her, she will make a follow up appointment for the patient this week.  The patient was or was not able to obtain any needed medicine or equipment. Patient obtained medication.  Are there diet recommendations that you are having difficulty following? Patient following brat diet with no issues.  Patient expresses understanding of discharge instructions and education provided has no other needs at this time.    [place] Resource Care Guide   [email or web address]  ?? [phone]   Website: [email or web address]  [email or web address]

## 2022-02-17 ENCOUNTER — Encounter: Payer: Self-pay | Admitting: Internal Medicine

## 2022-02-17 NOTE — Progress Notes (Unsigned)
Future Appointments  Date Time Provider Department  02/18/2022 10:30 AM [name], MD [facility]  04/22/2022                   wellness 11:00 AM [name], NP [facility]  08/16/2022 11:30 AM [name], MD [facility]  12/19/2022                   cpe  3:00 PM [name], MD [facility]    History of Present Illness:     The patient is a very  nice 89 y.o. MWF with  HTN, HLD, Prediabetes, Hypothyroidism, Allergic Asthma  and Vitamin D Deficiency who was seen 8 days ago in [facility] ER after a dx 4 days previous of Covid (now 12 days post). Apparently after prescribed Paxlovid at an Urgent Care she developed sx's of  N/V & Diarrhea. A 2sd trip to the Urgent Care prompted referral  to ER for IVF & per reports she improved with rehydration.    Current Outpatient Medications on File Prior to Visit  Medication Sig   VITAMIN C   500 mg  Take  daily.   aspirin EC 81 MG tablet Take daily.   bisoprolol-hctz 5-6.25 MG tablet TAKE 1 TABLET DAILY FOR BLOOD PRESSURE   budesonide (PULMICORT) 0.25 MG/2ML nebulizer solution USE 1 VIAL  IN  NEBULIZER TWICE  DAILY    Cholecalciferol (VITAMIN D PO) Take 5,000 Units by mouth daily.   formoterol (PERFOROMIST) 20 MCG/2ML nebulizer solution USE 1 VIAL  IN  NEBULIZER TWICE  DAILY    COMBIVENT RESPIMAT respimat USE 1 INHALATION EVERY 6 HOURS AS NEEDED   levothyroxine  75 MCG tablet TAKE AS INSTRUCTED BY YOUR PRESCRIBER   loratadine (CLARITIN) 10 MG tablet Take 1 tablet  daily as needed for allergies.   meclizine (ANTIVERT) 25 MG tablet Take 1/2 to 1 tablet 2 to 3 x /day as needed for Dizziness   metoCLOPramide 10 MG tablet Take 1 tablet (10 mg total) by mouth every 6 (six) hours.   Pantoprazole  40 MG tablet Take  1 tablet  Daily  to Prevent Heartburn &  Indigestion   sertraline 50 MG tablet Take  1 tablet  Daily  for mood.   zinc 50 MG tablet Take 50 mg by mouth daily-->> Advised    1/2 tab    Current Facility-Administered Medications  on File Prior to Visit  Medication   omalizumab Arvid Right) injection 150 mg      Problem list She has GERD; Abnormal glucose; Vitamin D deficiency; Hypothyroidism; Hyperlipidemia, mixed; Essential hypertension; Medication management; BMI 24.0-24.9, adult; Encounter for Medicare annual wellness exam; Vertigo; Hearing loss; Depression, major, recurrent, in partial remission ([place]); Osteoporosis; Extrinsic asthma, moderate persistent, uncomplicated; Labile hypertension; Malignant neoplasm of lower-outer quadrant of left breast of female, estrogen receptor positive ([place]); Family history of thyroid cancer; and Genetic testing on their problem list.   Observations/Objective:  BP (!) 106/50   Pulse 66   Temp 97.9 F (36.6 C)   Resp 17   Ht 5' (1.524 m)   Wt 123 lb 3.2 oz (55.9 kg)   SpO2 97%   BMI 24.06 kg/m   Postural      Sitting BP 117/62   P 58               &                Standing   BP 122/64  P64  HEENT - WNL. Neck - supple.  Chest - Clear equal BS. Cor - Nl HS. RRR w/o sig MGR. PP 1(+). No edema. MS- FROM w/o deformities.  Gait Nl. Neuro -  Nl w/o focal abnormalities.  Assessment and Plan:  1. Post covid-19 condition, unspecified  - CBC with Differential/Platelet - COMPLETE METABOLIC PANEL WITH GFR - Magnesium  2. Postural hypotension  - CBC with Differential/Platelet - COMPLETE METABOLIC PANEL WITH GFR - Magnesium  3. Medication management  - CBC with Differential/Platelet - COMPLETE METABOLIC PANEL WITH GFR - Magnesium   Follow Up Instructions:        I discussed the assessment and treatment plan with the patient. The patient was provided an opportunity to ask questions and all were answered. The patient agreed with the plan and demonstrated an understanding of the instructions.       The patient was advised to call back or seek an in-person evaluation if the symptoms worsen or if the condition fails to improve as anticipated.    Kirtland Bouchard,  MD

## 2022-02-18 ENCOUNTER — Ambulatory Visit (INDEPENDENT_AMBULATORY_CARE_PROVIDER_SITE_OTHER): Payer: Medicare Other | Admitting: Internal Medicine

## 2022-02-18 ENCOUNTER — Encounter: Payer: Self-pay | Admitting: Internal Medicine

## 2022-02-18 VITALS — BP 106/50 | HR 66 | Temp 97.9°F | Resp 17 | Ht 60.0 in | Wt 123.2 lb

## 2022-02-18 DIAGNOSIS — Z79899 Other long term (current) drug therapy: Secondary | ICD-10-CM

## 2022-02-18 DIAGNOSIS — R112 Nausea with vomiting, unspecified: Secondary | ICD-10-CM | POA: Diagnosis not present

## 2022-02-18 DIAGNOSIS — I951 Orthostatic hypotension: Secondary | ICD-10-CM

## 2022-02-18 DIAGNOSIS — U099 Post covid-19 condition, unspecified: Secondary | ICD-10-CM

## 2022-02-18 MED ORDER — ONDANSETRON HCL 8 MG PO TABS: ORAL_TABLET | ORAL | 1 refills | Status: AC

## 2022-02-18 NOTE — Patient Instructions (Signed)

## 2022-02-19 ENCOUNTER — Other Ambulatory Visit: Payer: Self-pay

## 2022-02-19 ENCOUNTER — Other Ambulatory Visit: Payer: Self-pay | Admitting: Internal Medicine

## 2022-02-19 DIAGNOSIS — Z1211 Encounter for screening for malignant neoplasm of colon: Secondary | ICD-10-CM

## 2022-02-19 DIAGNOSIS — R195 Other fecal abnormalities: Secondary | ICD-10-CM

## 2022-02-19 LAB — CBC WITH DIFFERENTIAL/PLATELET
Absolute Monocytes: 954 cells/uL — ABNORMAL HIGH (ref 200–950)
Basophils Absolute: 30 cells/uL (ref 0–200)
Basophils Relative: 0.5 %
Eosinophils Absolute: 48 cells/uL (ref 15–500)
Eosinophils Relative: 0.8 %
HCT: 37.3 % (ref 35.0–45.0)
Hemoglobin: 12.5 g/dL (ref 11.7–15.5)
Lymphs Abs: 1032 cells/uL (ref 850–3900)
MCH: 30.6 pg (ref 27.0–33.0)
MCHC: 33.5 g/dL (ref 32.0–36.0)
MCV: 91.4 fL (ref 80.0–100.0)
MPV: 10.6 fL (ref 7.5–12.5)
Monocytes Relative: 15.9 %
Neutro Abs: 3936 cells/uL (ref 1500–7800)
Neutrophils Relative %: 65.6 %
Platelets: 317 10*3/uL (ref 140–400)
RBC: 4.08 10*6/uL (ref 3.80–5.10)
RDW: 14 % (ref 11.0–15.0)
Total Lymphocyte: 17.2 %
WBC: 6 10*3/uL (ref 3.8–10.8)

## 2022-02-19 LAB — COMPLETE METABOLIC PANEL WITH GFR
AG Ratio: 1.6 (calc) (ref 1.0–2.5)
ALT: 22 U/L (ref 6–29)
AST: 23 U/L (ref 10–35)
Albumin: 3.9 g/dL (ref 3.6–5.1)
Alkaline phosphatase (APISO): 70 U/L (ref 37–153)
BUN: 15 mg/dL (ref 7–25)
CO2: 32 mmol/L (ref 20–32)
Calcium: 9.6 mg/dL (ref 8.6–10.4)
Chloride: 99 mmol/L (ref 98–110)
Creat: 0.72 mg/dL (ref 0.60–0.95)
Globulin: 2.5 g/dL (calc) (ref 1.9–3.7)
Glucose, Bld: 92 mg/dL (ref 65–99)
Potassium: 4.4 mmol/L (ref 3.5–5.3)
Sodium: 139 mmol/L (ref 135–146)
Total Bilirubin: 0.5 mg/dL (ref 0.2–1.2)
Total Protein: 6.4 g/dL (ref 6.1–8.1)
eGFR: 81 mL/min/{1.73_m2} (ref >=60)

## 2022-02-19 LAB — POC HEMOCCULT BLD/STL (HOME/3-CARD/SCREEN)
Card #2 Fecal Occult Blod, POC: POSITIVE
Card #3 Fecal Occult Blood, POC: POSITIVE
Fecal Occult Blood, POC: POSITIVE — AB

## 2022-02-19 LAB — MAGNESIUM: Magnesium: 1.9 mg/dL (ref 1.5–2.5)

## 2022-02-19 NOTE — Progress Notes (Signed)
<><><><><><><><><><><><><><><><><><><><><><><><><><><><><><><><><> <><><><><><><><><><><><><><><><><><><><><><><><><><><><><><><><><>  -   Labs all OK - CBC & blood chemistries all back to Normal  - Stool   hemoccult did test (+) positive  showing                                                                 blood "leaking" somewhere from the GI Tract  - So requested Gastroenterology referral to help evaluate source of apparent bleeding                           (  You should hear from them within the next week   )   <><><><><><><><><><><><><><><><><><><><><><><><><><><><><><><><><> <><><><><><><><><><><><><><><><><><><><><><><><><><><><><><><><><>  -

## 2022-02-19 NOTE — Progress Notes (Signed)
<><><><><><><><><><><><><><><><><><><><><><><><><><><><><><><><><> <><><><><><><><><><><><><><><><><><><><><><><><><><><><><><><><><>  -    Stool Hemoccult Positive for Blood    -->>       GI consultation requested   <><><><><><><><><><><><><><><><><><><><><><><><><><><><><><><><><> <><><><><><><><><><><><><><><><><><><><><><><><><><><><><><><><><>

## 2022-02-20 DIAGNOSIS — Z1211 Encounter for screening for malignant neoplasm of colon: Secondary | ICD-10-CM | POA: Diagnosis not present

## 2022-02-20 DIAGNOSIS — Z1212 Encounter for screening for malignant neoplasm of rectum: Secondary | ICD-10-CM | POA: Diagnosis not present

## 2022-02-21 ENCOUNTER — Telehealth: Payer: Self-pay

## 2022-02-21 NOTE — Telephone Encounter (Signed)
Patient informed that her fecal occult test was positive and a referral was put in to see Gastroenterology for further evaluation.

## 2022-02-22 ENCOUNTER — Telehealth: Payer: Self-pay | Admitting: Nurse Practitioner

## 2022-02-22 NOTE — Telephone Encounter (Signed)
Patient called and said that GI called her asking questions about when her last colonoscopy was and who performed it? Patient is requesting you call her so she can speak with you about it.

## 2022-02-22 NOTE — Telephone Encounter (Signed)
Spoke to patient. She is going to [facility] to sign a medical records release form, so they can obtain her records from [facility], per [facility].

## 2022-02-28 ENCOUNTER — Ambulatory Visit (INDEPENDENT_AMBULATORY_CARE_PROVIDER_SITE_OTHER): Payer: Medicare Other | Admitting: *Deleted

## 2022-02-28 VITALS — BP 181/74 | HR 58 | Temp 97.7°F | Resp 16 | Ht 60.0 in | Wt 121.8 lb

## 2022-02-28 DIAGNOSIS — J4540 Moderate persistent asthma, uncomplicated: Secondary | ICD-10-CM | POA: Diagnosis not present

## 2022-02-28 MED ORDER — OMALIZUMAB 150 MG/ML ~~LOC~~ SOSY
150.0000 mg | PREFILLED_SYRINGE | Freq: Once | SUBCUTANEOUS | Status: AC
  Administered 2022-02-28: 150 mg via SUBCUTANEOUS
  Filled 2022-02-28: qty 1

## 2022-02-28 NOTE — Progress Notes (Signed)
Diagnosis: Asthma  Provider:  Marshell Garfinkel MD  Procedure: Injection  Xolair (Omalizumab), Dose: 150 mg, Site: subcutaneous, Number of injections: 1  Post Care: Observation period completed  Discharge: Condition: Good, Destination: Home . AVS provided to patient.   Performed by:  Oren Beckmann, RN

## 2022-03-11 ENCOUNTER — Encounter: Payer: Self-pay | Admitting: Gastroenterology

## 2022-03-19 ENCOUNTER — Other Ambulatory Visit: Payer: Self-pay | Admitting: Obstetrics and Gynecology

## 2022-03-19 DIAGNOSIS — Z853 Personal history of malignant neoplasm of breast: Secondary | ICD-10-CM

## 2022-04-05 ENCOUNTER — Ambulatory Visit (INDEPENDENT_AMBULATORY_CARE_PROVIDER_SITE_OTHER): Payer: Medicare Other | Admitting: *Deleted

## 2022-04-05 VITALS — BP 157/73 | HR 57 | Temp 97.8°F | Resp 16 | Ht 60.0 in | Wt 122.6 lb

## 2022-04-05 DIAGNOSIS — J4540 Moderate persistent asthma, uncomplicated: Secondary | ICD-10-CM

## 2022-04-05 MED ORDER — OMALIZUMAB 150 MG/ML ~~LOC~~ SOSY
150.0000 mg | PREFILLED_SYRINGE | Freq: Once | SUBCUTANEOUS | Status: AC
  Administered 2022-04-05: 150 mg via SUBCUTANEOUS

## 2022-04-05 NOTE — Progress Notes (Signed)
Diagnosis: Asthma  Provider:  Marshell Garfinkel MD  Procedure: Injection  Xolair (Omalizumab), Dose: 150 mg, Site: subcutaneous, Number of injections: 1  Post Care: Observation period completed  Discharge: Condition: Good, Destination: Home . AVS Provided and AVS Declined  Performed by:  Oren Beckmann, RN

## 2022-04-10 ENCOUNTER — Ambulatory Visit: Payer: Medicare Other | Admitting: Gastroenterology

## 2022-04-22 ENCOUNTER — Ambulatory Visit (INDEPENDENT_AMBULATORY_CARE_PROVIDER_SITE_OTHER): Payer: Medicare Other | Admitting: Nurse Practitioner

## 2022-04-22 ENCOUNTER — Encounter: Payer: Self-pay | Admitting: Nurse Practitioner

## 2022-04-22 VITALS — BP 110/62 | HR 61 | Temp 97.5°F | Ht 60.0 in | Wt 123.0 lb

## 2022-04-22 DIAGNOSIS — Z79899 Other long term (current) drug therapy: Secondary | ICD-10-CM | POA: Diagnosis not present

## 2022-04-22 DIAGNOSIS — M810 Age-related osteoporosis without current pathological fracture: Secondary | ICD-10-CM | POA: Diagnosis not present

## 2022-04-22 DIAGNOSIS — E039 Hypothyroidism, unspecified: Secondary | ICD-10-CM | POA: Diagnosis not present

## 2022-04-22 DIAGNOSIS — J4540 Moderate persistent asthma, uncomplicated: Secondary | ICD-10-CM | POA: Diagnosis not present

## 2022-04-22 DIAGNOSIS — Z0001 Encounter for general adult medical examination with abnormal findings: Secondary | ICD-10-CM

## 2022-04-22 DIAGNOSIS — E782 Mixed hyperlipidemia: Secondary | ICD-10-CM

## 2022-04-22 DIAGNOSIS — K219 Gastro-esophageal reflux disease without esophagitis: Secondary | ICD-10-CM | POA: Diagnosis not present

## 2022-04-22 DIAGNOSIS — R6889 Other general symptoms and signs: Secondary | ICD-10-CM

## 2022-04-22 DIAGNOSIS — F3341 Major depressive disorder, recurrent, in partial remission: Secondary | ICD-10-CM

## 2022-04-22 DIAGNOSIS — I10 Essential (primary) hypertension: Secondary | ICD-10-CM

## 2022-04-22 DIAGNOSIS — E559 Vitamin D deficiency, unspecified: Secondary | ICD-10-CM

## 2022-04-22 DIAGNOSIS — Z0000 Encounter for general adult medical examination without abnormal findings: Secondary | ICD-10-CM

## 2022-04-22 NOTE — Progress Notes (Signed)
MEDICARE ANNUAL WELLNESS VISIT AND FOLLOW UP Assessment:   Kathy Whitaker was seen today for medicare wellness.  Diagnoses and all orders for this visit:  Encounter for Medicare annual wellness exam Due Annually Health  maintenance reviewed  Essential hypertension Continue medications Discussed DASH (Dietary Approaches to Stop Hypertension) DASH diet is lower in sodium than a typical American diet. Cut back on foods that are high in saturated fat, cholesterol, and trans fats. Eat more whole-grain foods, fish, poultry, and nuts Remain active and exercise as tolerated daily.  Monitor BP at home-Call if greater than 130/80.   Gastroesophageal reflux disease, unspecified whether esophagitis present/Positive Guaiac  Has GI apt scheduled for next week 04/2022 No suspected reflux complications ([place]/stricture). Lifestyle modification:  wt loss, avoid meals 2-3h before bedtime. Consider eliminating food triggers:  chocolate, caffeine, EtOH, acid/spicy food.  Hypothyroidism, unspecified type Controlled. Continue Levothyroxine. Reminded to take on an empty stomach 30-56mins before food.  Stop any Biotin Supplement 48-72 hours before next TSH level to reduce the risk of falsely low TSH levels. Continue to monitor.    Age-related osteoporosis without current pathological fracture Pursue a combination of weight-bearing exercises and strength training. Advised on fall prevention measures including proper lighting in all rooms, removal of area rugs and floor clutter, use of walking devices as deemed appropriate, avoidance of uneven walking surfaces. Smoking cessation and moderate alcohol consumption if applicable Consume [id number] to 1000 IU of vitamin D daily with a goal vitamin D serum value of 30 ng/mL or higher. Aim for 1000 to 1200 mg of elemental calcium daily through supplements and/or dietary sources.  Vitamin D deficiency Continue Supplement for a goal of 60-100. Monitor Vitamin D levels    Hyperlipidemia, mixed Discussed lifestyle modifications. Recommended diet heavy in fruits and veggies, omega 3's. Decrease consumption of animal meats, cheeses, and dairy products. Remain active and exercise as tolerated. Continue to monitor.  Depression, major, recurrent, in partial remission ([place]) Controlled. Reviewed relaxation techniques.  Sleep hygiene. Recommended mindfulness meditation and exercise.   Encouraged personality growth wand development through coping techniques and problem-solving skills. Limit/Decrease/Monitor drug/alcohol intake.    Extrinsic asthma, moderate persistent, uncomplicated Controlled. Continue medications. Avoid triggers  Medication management All medications discussed and reviewed in full. All questions and concerns regarding medications addressed.  No orders of the defined types were placed in this encounter.  Lab work completed 02/2022 - reviewed and stable.  I discussed the assessment and treatment plan with the patient. The patient was provided an opportunity to ask questions and all were answered. The patient agreed with the plan and demonstrated an understanding of the instructions.   The patient was advised to call back or seek an in-person evaluation if the symptoms worsen or if the condition fails to improve as anticipated.  I provided 30 minutes of face-to-face time during this encounter including counseling, chart review, and critical decision making was performed.  Future Appointments  Date Time Provider [place]  05/01/2022  9:40 AM GI-[facility] DIAG TOMO 1 GI-BCGMM GI-BREAST CE  05/07/2022  1:00 PM CHINF-CHAIR 1 CH-IN[facility]M None  06/10/2022  1:00 PM CHINF-CHAIR 1 CH-IN[facility]M None  08/16/2022 11:30 AM [name], MD [facility]UL[facility] None  08/22/2022  2:30 PM [name], MD [facility] None  12/19/2022  3:00 PM [name], MD [facility] None  04/18/2023 11:00 AM [name], NP [facility] None     Plan:   During the  course of the visit the patient was educated and counseled about appropriate screening and preventive  services including:   Pneumococcal vaccine  Influenza vaccine Prevnar 13 Td vaccine Screening electrocardiogram, deferred, COVID19, telephone visit. Colorectal cancer screening Diabetes screening Glaucoma screening Nutrition counseling    Subjective:  Kathy Whitaker is a 89 y.o. female who presents for Medicare Annual Wellness Visit and 3 month follow up for hypothyroidism, hyperlipidemia, abnormal glucose, depression.  She had Covid 2022-03-03 with sever symptoms requirigin hosptail ER visit, no admission.  Reports 6 weeks of recoery with extensive diarrhea.  She is feeling much better, no more diatthrea, however, during annual CPE her quiac revealed postiive.  She does not notice any gross melena.  Denies fatigue.  Last Hgb 03-Mar-2022) WNL.  Most likely secondary to either undiagnosed hemorrhoid which she denies any  hx or possible inflammation and aggravation caused by the diarrhea.  However, she does admit to taking more IBU these past several weeks.  She has an apt scheduled with GI next week.  She was originally concerned for left breast lump.  Has lumpectomy.  Does not wish to pursue any further treatment at this time.  She will continue to monitor every six months with Onc/GYN. Reports feeling well with no new concerns.  Her husband passed away in 03-Mar-2020.  She continues to have days when she feels down, continues to cope well. On zoloft 50 mg daily for mood and feels symptoms are currently well controlled.  Planning on moving to [facility] with her son and DIL who helps to care for her and visits when she is sick.    Patient has history of Asthma/allergies, follows with Dr. Marlene Lard. She is on pulmicort/performist once a day, combivent PRN, 2-3 times per month, and claritin and xolair injections that have helped significantly.   BMI is Body mass index is 24.02 kg/m., she has been working on diet and  exercise.  Looking forward to season change.   Wt Readings from Last 3 Encounters:  04/22/22 123 lb (55.8 kg)  04/05/22 122 lb 9.6 oz (55.6 kg)  02/28/22 121 lb 12.8 oz (55.2 kg)   Her blood pressure has been controlled at home, today their BP is BP: 110/62.  She denies chest pain, shortness of breath.  BP Readings from Last 3 Encounters:  04/22/22 110/62  04/05/22 (!) 157/73  02/28/22 (!) 181/74     She is on cholesterol medication  (simvastatin 40 mg daily) and denies myalgias. Her cholesterol is at goal. The cholesterol last visit was:   Lab Results  Component Value Date   CHOL 224 (H) 12/13/2021   HDL 70 12/13/2021   LDLCALC 129 (H) 12/13/2021   TRIG 140 12/13/2021   CHOLHDL 3.2 12/13/2021   She has not been working on diet and exercise for abnormal glucose, and denies hyperglycemia, hypoglycemia , increased appetite, nausea, paresthesia of the feet, polydipsia, polyuria, visual disturbances and vomiting. Last A1C in the office was:  Lab Results  Component Value Date   HGBA1C 5.5 12/13/2021   Last GFR Lab Results  Component Value Date   GFRNONAA >60 02/09/2022   She is on thyroid medication. Her medication was not changed last visit.   Lab Results  Component Value Date   TSH 1.97 12/13/2021    Patient is on Vitamin D supplement.   Lab Results  Component Value Date   VD25OH 54 12/13/2021      Medication Review:  Current Outpatient Medications (Endocrine & Metabolic):    levothyroxine (SYNTHROID) 75 MCG tablet, TAKE AS INSTRUCTED BY YOUR PRESCRIBER   Current Outpatient  Medications (Cardiovascular):    bisoprolol-hydrochlorothiazide (ZIAC) 5-6.25 MG tablet, TAKE 1 TABLET DAILY FOR BLOOD PRESSURE   Current Outpatient Medications (Respiratory):    budesonide (PULMICORT) 0.25 MG/2ML nebulizer solution, USE 1 VIAL  IN  NEBULIZER TWICE  DAILY - rinse mouth after treatment   formoterol (PERFOROMIST) 20 MCG/2ML nebulizer solution, USE 1 VIAL  IN  NEBULIZER TWICE   DAILY - morning and evening   Ipratropium-Albuterol (COMBIVENT RESPIMAT) 20-100 MCG/ACT AERS respimat, USE 1 INHALATION EVERY 6 HOURS AS NEEDED FOR WHEEZING   loratadine (CLARITIN) 10 MG tablet, Take 1 tablet (10 mg total) by mouth daily as needed for allergies.  Current Facility-Administered Medications (Respiratory):    omalizumab Arvid Right) injection 150 mg  Current Outpatient Medications (Analgesics):    aspirin EC 81 MG tablet, Take 81 mg by mouth daily.     Current Outpatient Medications (Other):    Ascorbic Acid (VITAMIN C PO), Take 500 mg by mouth daily.   Cholecalciferol (VITAMIN D PO), Take 5,000 Units by mouth daily.   meclizine (ANTIVERT) 25 MG tablet, Take 1/2 to 1 tablet 2 to 3 x /day as needed for Dizziness / Vertigo   metoCLOPramide (REGLAN) 10 MG tablet, Take 1 tablet (10 mg total) by mouth every 6 (six) hours.   ondansetron (ZOFRAN) 8 MG tablet, Take  1/2 to 1 tablet  3 x /day (every 6 hours)  as needed for Nausea / Vomitting   sertraline (ZOLOFT) 50 MG tablet, Take  1 tablet  Daily  for mood.   zinc gluconate 50 MG tablet, Take 50 mg by mouth daily.   pantoprazole (PROTONIX) 40 MG tablet, Take  1 tablet  Daily  to Prevent Heartburn &  Indigestion (Patient not taking: Reported on 04/22/2022)    Allergies: Allergies  Allergen Reactions   Paxlovid [Nirmatrelvir-Ritonavir] Nausea And Vomiting   Codeine Nausea Only   Propoxyphene N-Acetaminophen Nausea Only   Reglan [Metoclopramide]     EPS   Shellfish-Derived Products Rash    Current Problems (verified) has GERD; Abnormal glucose; Vitamin D deficiency; Hypothyroidism; Hyperlipidemia, mixed; Essential hypertension; Medication management; BMI 24.0-24.9, adult; Encounter for Medicare annual wellness exam; Vertigo; Hearing loss; Depression, major, recurrent, in partial remission ([place]); Osteoporosis; Extrinsic asthma, moderate persistent, uncomplicated; Labile hypertension; Malignant neoplasm of lower-outer quadrant of  left breast of female, estrogen receptor positive ([place]); Family history of thyroid cancer; and Genetic testing on their problem list.  Screening Tests Immunization History  Administered Date(s) Administered   Influenza Whole 10/05/2009, 11/26/2010, 11/05/2011   Influenza, High Dose Seasonal PF 11/20/2012, 11/22/2013, 11/21/2014, 11/10/2015, 11/19/2016, 11/20/2017, 11/10/2018, 11/17/2019, 11/22/2020, 11/13/2021   PFIZER(Purple Top)SARS-COV-2 Vaccination 04/04/2019, 04/28/2019   Pneumococcal Conjugate-13 11/22/2013   Pneumococcal Polysaccharide-23 09/14/2008   Td 10/09/2004, 03/12/2017   Zoster, Live 02/15/2013    Preventative care: Last colonoscopy: 2015  Mammogram: 07/24/21 - Q6M DEXA: GYN follows - completing Q2 years.    Prior vaccinations:  TD or Tdap: 2019  Influenza: 11/2021 Pneumococcal: 2010 Prevnar13: 2015  Shingles/Zostavax: 2015 Covid 19: 2/2, 2021, pfizer  Names of Other Physician/Practitioners you currently use: 1. [facility] here for primary care 2. Eye Exam: Dr. Katy Fitch, last 2023,  3. Dental Exam: 2023, goes q40m  Patient Care Team: [name], MD as PCP - General (Internal Medicine) Arvella Nigh, MD as Consulting Physician (Obstetrics and Gynecology) Christinia Gully , MD (Pulmonology)- last visit 08/2020  Surgical: She  has a past surgical history that includes Tubal ligation (Bilateral, 1970); Tonsillectomy; Wisdom tooth extraction;  Dilatation & currettage/hysteroscopy with resectoscope (N/A, 09/10/2013); Cataract extraction (Right, 09/03/2019); and Breast lumpectomy with radioactive seed localization (Left, 07/25/2021). Family Her family history includes Gout in her brother; Non-Hodgkin's lymphoma in her brother; Other in her father; Thyroid cancer (age of onset: 57) in her brother; Thyroid disease in her mother. Social history  She reports that she has never smoked. She has never used smokeless tobacco. She reports  current alcohol use of about 3.0 standard drinks of alcohol per week. She reports that she does not use drugs.  MEDICARE WELLNESS OBJECTIVES: Physical activity: Current Exercise Habits: Home exercise routine Cardiac risk factors:   Depression/mood screen:      04/22/2022   11:45 AM  Depression screen PHQ 2/9  Decreased Interest 0  Down, Depressed, Hopeless 1  PHQ - 2 Score 1    ADLs:     04/22/2022   11:36 AM 12/16/2021   12:05 PM  In your present state of health, do you have any difficulty performing the following activities:  Hearing? 0 0  Vision? 0 0  Difficulty concentrating or making decisions? 0 0  Walking or climbing stairs? 0 0  Dressing or bathing? 0 0  Doing errands, shopping? 0 0  Preparing Food and eating ? N   Using the Toilet? N   In the past six months, have you accidently leaked urine? N   Do you have problems with loss of bowel control? N   Managing your Medications? N   Managing your Finances? N   Housekeeping or managing your Housekeeping? N      Cognitive Testing  Alert? Yes  Normal Appearance?Yes  Oriented to person? Yes  Place? Yes   Time? Yes  Recall of three objects?  Yes  Can perform simple calculations? Yes  Displays appropriate judgment?Yes  Can read the correct time from a watch face?Yes  EOL planning:     Objective:   Today's Vitals   04/22/22 1104  BP: 110/62  Pulse: 61  Temp: (!) 97.5 F (36.4 C)  SpO2: 99%  Weight: 123 lb (55.8 kg)  Height: 5' (1.524 m)    Body mass index is 24.02 kg/m.  General Appearance: Well nourished, in no apparent distress. Eyes: PERRLA, EOMs, conjunctiva no swelling or erythema Sinuses: No Frontal/maxillary tenderness ENT/Mouth: Ext aud canals clear, TMs without erythema, bulging. No erythema, swelling, or exudate on post pharynx.  Tonsils not swollen or erythematous. Hearing normal.  Neck: Supple, thyroid normal.  Respiratory: Respiratory effort normal, BS equal bilaterally without rales,  rhonchi, wheezing or stridor.  Cardio: RRR with no MRGs. Brisk peripheral pulses without edema.  Abdomen: Soft, + BS.  Non tender, no guarding, rebound, hernias, masses. Lymphatics: Non tender without lymphadenopathy.  Musculoskeletal: Full ROM, no effusions, 5/5 strength, Normal gait Skin: Warm, dry without rashes, lesions, ecchymosis.  Neuro: Cranial nerves intact. No cerebellar symptoms.  Psych: Awake and oriented X 3, normal affect, Insight and Judgment appropriate.   Medicare Attestation I have personally reviewed: The patient's medical and social history Their use of alcohol, tobacco or illicit drugs Their current medications and supplements The patient's functional ability including ADLs,fall risks, home safety risks, cognitive, and hearing and visual impairment Diet and physical activities Evidence for depression or mood disorders  The patient's weight, height, BMI, have been recorded in the chart.  I have made referrals, counseling, and provided education to the patient based on review of the above and I have provided the patient with a written personalized care plan for  preventive services.     [name], NP [facility]  04/22/2022

## 2022-04-22 NOTE — Patient Instructions (Signed)

## 2022-04-24 DIAGNOSIS — Z86010 Personal history of colonic polyps: Secondary | ICD-10-CM | POA: Diagnosis not present

## 2022-04-24 DIAGNOSIS — R195 Other fecal abnormalities: Secondary | ICD-10-CM | POA: Diagnosis not present

## 2022-05-01 ENCOUNTER — Ambulatory Visit
Admission: RE | Admit: 2022-05-01 | Discharge: 2022-05-01 | Disposition: A | Discharge status: Home / Self Care | Payer: Medicare Other | Source: Ambulatory Visit | Attending: Obstetrics and Gynecology | Admitting: Obstetrics and Gynecology

## 2022-05-01 DIAGNOSIS — Z853 Personal history of malignant neoplasm of breast: Secondary | ICD-10-CM | POA: Diagnosis not present

## 2022-05-01 DIAGNOSIS — R928 Other abnormal and inconclusive findings on diagnostic imaging of breast: Secondary | ICD-10-CM | POA: Diagnosis not present

## 2022-05-03 ENCOUNTER — Ambulatory Visit: Payer: Medicare Other

## 2022-05-03 ENCOUNTER — Other Ambulatory Visit: Payer: Self-pay | Admitting: Internal Medicine

## 2022-05-03 DIAGNOSIS — E039 Hypothyroidism, unspecified: Secondary | ICD-10-CM

## 2022-05-07 ENCOUNTER — Ambulatory Visit (INDEPENDENT_AMBULATORY_CARE_PROVIDER_SITE_OTHER): Payer: Medicare Other

## 2022-05-07 ENCOUNTER — Ambulatory Visit: Payer: Medicare Other

## 2022-05-07 VITALS — BP 137/67 | HR 57 | Temp 97.4°F | Resp 16 | Ht 60.0 in | Wt 124.2 lb

## 2022-05-07 DIAGNOSIS — J4540 Moderate persistent asthma, uncomplicated: Secondary | ICD-10-CM

## 2022-05-07 MED ORDER — OMALIZUMAB 150 MG/ML ~~LOC~~ SOSY
150.0000 mg | PREFILLED_SYRINGE | Freq: Once | SUBCUTANEOUS | Status: AC
  Administered 2022-05-07: 150 mg via SUBCUTANEOUS
  Filled 2022-05-07: qty 1

## 2022-05-07 NOTE — Progress Notes (Signed)
Diagnosis: Asthma  Provider:  Marshell Garfinkel MD  Procedure: Injection  Xolair (Omalizumab), Dose: 150 mg, Site: subcutaneous, Number of injections: 1  Post Care:  na  Discharge: Condition: Good, Destination: Home . AVS Declined  Performed by:  Adelina Mings, LPN

## 2022-05-31 ENCOUNTER — Ambulatory Visit: Payer: Medicare Other

## 2022-06-04 ENCOUNTER — Ambulatory Visit: Payer: Medicare Other

## 2022-06-05 ENCOUNTER — Ambulatory Visit: Payer: Medicare Other

## 2022-06-10 ENCOUNTER — Ambulatory Visit (INDEPENDENT_AMBULATORY_CARE_PROVIDER_SITE_OTHER): Payer: Medicare Other

## 2022-06-10 VITALS — BP 150/78 | HR 58 | Temp 97.6°F | Resp 20 | Ht 60.0 in | Wt 124.6 lb

## 2022-06-10 DIAGNOSIS — J4540 Moderate persistent asthma, uncomplicated: Secondary | ICD-10-CM

## 2022-06-10 MED ORDER — OMALIZUMAB 150 MG/ML ~~LOC~~ SOSY
150.0000 mg | PREFILLED_SYRINGE | Freq: Once | SUBCUTANEOUS | Status: AC
  Administered 2022-06-10: 150 mg via SUBCUTANEOUS

## 2022-06-10 NOTE — Progress Notes (Signed)
Diagnosis: Asthma  Provider:  Chilton Greathouse MD  Procedure: Injection  Xolair (Omalizumab), Dose: 150 mg, Site: subcutaneous, Number of injections: 1  Post Care: Patient declined observation  Discharge: Condition: Good, Destination: Home . AVS Declined  Performed by:  Wyvonne Lenz, RN

## 2022-06-28 ENCOUNTER — Other Ambulatory Visit: Payer: Self-pay | Admitting: Internal Medicine

## 2022-06-28 DIAGNOSIS — J4530 Mild persistent asthma, uncomplicated: Secondary | ICD-10-CM

## 2022-07-10 DIAGNOSIS — H26493 Other secondary cataract, bilateral: Secondary | ICD-10-CM | POA: Diagnosis not present

## 2022-07-10 DIAGNOSIS — H04123 Dry eye syndrome of bilateral lacrimal glands: Secondary | ICD-10-CM | POA: Diagnosis not present

## 2022-07-10 DIAGNOSIS — Z961 Presence of intraocular lens: Secondary | ICD-10-CM | POA: Diagnosis not present

## 2022-07-15 ENCOUNTER — Ambulatory Visit (INDEPENDENT_AMBULATORY_CARE_PROVIDER_SITE_OTHER): Payer: Medicare Other

## 2022-07-15 VITALS — BP 147/69 | HR 48 | Temp 97.4°F | Resp 16 | Ht 60.0 in | Wt 126.0 lb

## 2022-07-15 DIAGNOSIS — J4540 Moderate persistent asthma, uncomplicated: Secondary | ICD-10-CM

## 2022-07-15 MED ORDER — OMALIZUMAB 150 MG/ML ~~LOC~~ SOSY
150.0000 mg | PREFILLED_SYRINGE | Freq: Once | SUBCUTANEOUS | Status: AC
  Administered 2022-07-15: 150 mg via SUBCUTANEOUS
  Filled 2022-07-15: qty 1

## 2022-07-15 NOTE — Progress Notes (Signed)
Diagnosis: Asthma  Provider:  Chilton Greathouse MD  Procedure: Injection  Xolair (Omalizumab), Dose: 150 mg, Site: subcutaneous, Number of injections: 1  Post Care:  n/a  Discharge: Condition: Good, Destination: Home . AVS Declined  Performed by:  Loney Hering, LPN

## 2022-07-24 ENCOUNTER — Other Ambulatory Visit: Payer: Self-pay | Admitting: Internal Medicine

## 2022-07-24 DIAGNOSIS — F3341 Major depressive disorder, recurrent, in partial remission: Secondary | ICD-10-CM

## 2022-08-16 ENCOUNTER — Ambulatory Visit: Payer: Medicare Other | Admitting: Internal Medicine

## 2022-08-16 NOTE — Progress Notes (Deleted)
Subjective:     Patient ID: Kathy Whitaker, female   DOB: 01/04/1934   MRN: [id number]    Brief patient profile:  68  yowf never smoker with h/o allergic rhinitis/ moderate chronic asthma dating back to  Her late 37s and much better on xolair/performist/budesonide previously under Dr Lynelle Doctor care  - on Xolair since around 2009 / reduced to monthly rx 04/2015 by Dr Maple Hudson      History of Present Illness 05/02/2015 1st   office visit/ Sherene Sires / transition of care from Dr Delford Field  Chief Complaint  Patient presents with   Follow-up    Former PW pt. Pt c/o occasional SOB with exertion. Pt states that her breathing is doing well. She recently had a cold but is improving. Pt denies cough/wheeze/CP/tightness. Pt is on Xolair and reports no complications/reactions.   maint rx with performist/budesonide rarely need combivent (very poor hfa noted - see a/p) Says other forms of ICS don't work. Concerned about longterm effects of meds  rec Combine your nebulizer solutions and take each am plus second treatment in pm if needed. Work on [employer].    08/15/2021  f/u ov/ re: asthma/ rhinitis maint on xolair/ performist/bud    Chief Complaint  Patient presents with   Follow-up    Sob- better, cough dry occass.  Dyspnea:  MMRC1 = can walk nl pace, flat grade, can't hurry or go uphills or steps s sob   Cough: minimal dry  Sleeping: flat bed / one pillow  SABA use: rarely 02: none  Recs No change f/u yearly    08/16/2022 Yearly  f/u ov/ re: asthma/ rhinitis    maint on ***  No chief complaint on file.   Dyspnea:  *** Cough: *** Sleeping: *** SABA use: *** 02: *** Covid status:   *** Lung cancer screening :  ***    No obvious day to day or daytime variability or assoc excess/ purulent sputum or mucus plugs or hemoptysis or cp or chest tightness, subjective wheeze or overt sinus or hb symptoms.   *** without nocturnal  or early am exacerbation  of respiratory  c/o's or need for  noct saba. Also denies any obvious fluctuation of symptoms with weather or environmental changes or other aggravating or alleviating factors except as outlined above   No unusual exposure hx or h/o childhood pna/ asthma or knowledge of premature birth.  Current Allergies, Complete Past Medical History, Past Surgical History, Family History, and Social History were reviewed in [employer] record.  ROS  The following are not active complaints unless bolded Hoarseness, sore throat, dysphagia, dental problems, itching, sneezing,  nasal congestion or discharge of excess mucus or purulent secretions, ear ache,   fever, chills, sweats, unintended wt loss or wt gain, classically pleuritic or exertional cp,  orthopnea pnd or arm/hand swelling  or leg swelling, presyncope, palpitations, abdominal pain, anorexia, nausea, vomiting, diarrhea  or change in bowel habits or change in bladder habits, change in stools or change in urine, dysuria, hematuria,  rash, arthralgias, visual complaints, headache, numbness, weakness or ataxia or problems with walking or coordination,  change in mood or  memory.        No outpatient medications have been marked as taking for the 08/16/22 encounter (Appointment) with Nyoka Cowden, MD.   Current Facility-Administered Medications for the 08/16/22 encounter (Appointment) with Nyoka Cowden, MD  Medication   omalizumab Geoffry Paradise) injection 150 mg  Current Meds  Medication Sig   Ascorbic Acid (VITAMIN C PO) Take 500 mg by mouth daily.   aspirin EC 81 MG tablet Take 81 mg by mouth daily.   bisoprolol-hydrochlorothiazide (ZIAC) 5-6.25 MG tablet Take  1 tablet  Daily  for BP   budesonide (PULMICORT) 0.25 MG/2ML nebulizer solution USE 1 VIAL  IN  NEBULIZER TWICE  DAILY - rinse mouth after treatment   Cholecalciferol (VITAMIN D PO) Take 5,000 Units by mouth daily.   dicyclomine (BENTYL) 20 MG tablet Take 1/2 to 1 tablet 4 x /day before  Meals & Bedtime for Abdominal Cramping , Bloating or Nausea   formoterol (PERFOROMIST) 20 MCG/2ML nebulizer solution USE 1 VIAL  IN  NEBULIZER TWICE  DAILY - morning and evening   Ipratropium-Albuterol (COMBIVENT RESPIMAT) 20-100 MCG/ACT AERS respimat USE 1 INHALATION EVERY 6 HOURS AS NEEDED FOR WHEEZING   levothyroxine (SYNTHROID) 75 MCG tablet TAKE AS INSTRUCTED BY YOUR PRESCRIBER   loratadine (CLARITIN) 10 MG tablet Take 1 tablet (10 mg total) by mouth daily as needed for allergies.   meclizine (ANTIVERT) 25 MG tablet Take 1/2 to 1 tablet 3 x /day as needed for dizziness / Vertigo   omeprazole (PRILOSEC) 40 MG capsule Take  1 capsule  Daily  to Prevent Heartburn & Indigestion   sertraline (ZOLOFT) 50 MG tablet Take  1 tablet  Daily  for mood.   traMADol (ULTRAM) 50 MG tablet Take 1 tablet (50 mg total) by mouth every 6 (six) hours as needed.   vitamin E 600 UNIT capsule Take 600 Units by mouth daily.   zinc gluconate 50 MG tablet Take 50 mg by mouth daily.   Current Facility-Administered Medications for the 08/15/21 encounter (Office Visit) with Nyoka Cowden, MD  Medication   omalizumab Geoffry Paradise) injection 150 mg   omalizumab Geoffry Paradise) injection 150 mg                       Objective:   Physical Exam  Wts  08/16/2022        ***  08/15/2021        122  08/10/2020          123 06/04/2019        125   11/18/2017     127   05/02/15 136 lb (61.689 kg)  11/29/14 136 lb (61.689 kg)  03/23/14 138 lb (62.596 kg)    Vital signs reviewed  08/16/2022  - Note at rest 02 sats  ***% on ***   General appearance:    ***  Min bar ***              Assessment:

## 2022-08-19 ENCOUNTER — Ambulatory Visit: Payer: Medicare Other

## 2022-08-19 VITALS — BP 116/68 | HR 51 | Temp 97.9°F | Resp 18 | Ht 60.0 in | Wt 125.4 lb

## 2022-08-19 DIAGNOSIS — J4540 Moderate persistent asthma, uncomplicated: Secondary | ICD-10-CM | POA: Diagnosis not present

## 2022-08-19 MED ORDER — OMALIZUMAB 150 MG/ML ~~LOC~~ SOSY
150.0000 mg | PREFILLED_SYRINGE | Freq: Once | SUBCUTANEOUS | Status: AC
  Administered 2022-08-19: 150 mg via SUBCUTANEOUS
  Filled 2022-08-19: qty 1

## 2022-08-19 NOTE — Progress Notes (Signed)
Diagnosis: Asthma  Provider:  Chilton Greathouse MD  Procedure: Injection  Xolair (Omalizumab), Dose: 150 mg, Site: subcutaneous, Number of injections: 1  Post Care:  n/a  Discharge: Condition: Good, Destination: Home . AVS Declined  Performed by:  Loney Hering, LPN

## 2022-08-21 DIAGNOSIS — H26493 Other secondary cataract, bilateral: Secondary | ICD-10-CM | POA: Diagnosis not present

## 2022-08-21 DIAGNOSIS — H04123 Dry eye syndrome of bilateral lacrimal glands: Secondary | ICD-10-CM | POA: Diagnosis not present

## 2022-08-21 DIAGNOSIS — Z961 Presence of intraocular lens: Secondary | ICD-10-CM | POA: Diagnosis not present

## 2022-08-21 NOTE — Progress Notes (Unsigned)
Future Appointments  Date Time Provider Department  08/22/2022                           6 mo ov   2:30 PM Lucky Cowboy, MD [facility]  10/08/2022  3:15 PM Nyoka Cowden, MD [facility]  12/19/2022                         cpe  3:00 PM Lucky Cowboy, MD [facility]  04/18/2023                         wellness  11:00 AM Adela Glimpse, NP [facility]    History of Present Illness:       This very nice 89 y.o.  WWF  with HTN, HLD, Pre-Diabetes,  Hypothyroidism, Allergic Asthma  and Vitamin D Deficiency  who  presents for 6 month follow up               In June 2023 , patient had a Lt Breast lumpectomy for Stage 1a Breast Ca. Patient was seen in follow-up by Dr Terrace Arabia & declined adjuvant antiestrogen therapy       Patient is treated for HTN  (2004)   & BP has been controlled at home. Today's BP: (!) 142/70. Patient has had no complaints of any cardiac type chest pain, palpitations, dyspnea / orthopnea / PND, dizziness, claudication, or dependent edema.        Hyperlipidemia is controlled with diet & Simvastatin. Patient denies myalgias or other med SE's. Last Lipids were not at goal :  Lab Results  Component Value Date   CHOL 224 (H) 12/13/2021   HDL 70 12/13/2021   LDLCALC 129 (H) 12/13/2021   TRIG 140 12/13/2021   CHOLHDL 3.2 12/13/2021           Patient has been on Thyroid Replacement since the 1980's when dx'd  Hypothyroid.     Also, the patient has history of PreDiabetes (A1c 6.0% /2011 and 5.7% /2012)  and has had no symptoms of reactive hypoglycemia, diabetic polys, paresthesias or visual blurring.  Last A1c was at goal :  Lab Results  Component Value Date   HGBA1C 5.5 12/13/2021                                                           Further, the patient also has history of Vitamin D Deficiency and supplements vitamin D . Last vitamin D was sl low  ( goal 70-100) :  Lab Results  Component Value Date   VD25OH 54 12/13/2021     Current Outpatient  Medications on File Prior to Visit  Medication Sig   Ascorbic Acid (VITAMIN C PO) Take 500 mg by mouth daily.   bisoprolol-hydrochlorothiazide (ZIAC) 5-6.25 MG tablet TAKE 1 TABLET DAILY FOR BLOOD PRESSURE   budesonide (PULMICORT) 0.25 MG/2ML nebulizer solution USE 1 VIAL  IN  NEBULIZER TWICE  DAILY - rinse mouth after treatment   Cholecalciferol (VITAMIN D PO) Take 5,000 Units by mouth daily.   formoterol (PERFOROMIST) 20 MCG/2ML nebulizer solution USE 1 VIAL  IN  NEBULIZER TWICE  DAILY - morning and evening   Ipratropium-Albuterol (COMBIVENT RESPIMAT) 20-100 MCG/ACT AERS  respimat USE 1 INHALATION EVERY 6 HOURS AS NEEDED FOR WHEEZING   levothyroxine (SYNTHROID) 75 MCG tablet Take  1 tablet  Daily  on an empty stomach with only water for 30 minutes & no Antacid meds, Calcium or Magnesium for 4 hours & avoid Biotin   loratadine (CLARITIN) 10 MG tablet Take 1 tablet (10 mg total) by mouth daily as needed for allergies.   meclizine (ANTIVERT) 25 MG tablet Take 1/2 to 1 tablet 2 to 3 x /day as needed for Dizziness / Vertigo   metoCLOPramide (REGLAN) 10 MG tablet Take 1 tablet (10 mg total) by mouth every 6 (six) hours.   ondansetron (ZOFRAN) 8 MG tablet Take  1/2 to 1 tablet  3 x /day (every 6 hours)  as needed for Nausea / Vomitting   sertraline (ZOLOFT) 50 MG tablet TAKE 1 TABLET DAILY FOR MOOD   zinc gluconate 50 MG tablet Take 50 mg by mouth daily.   aspirin EC 81 MG tablet Take 81 mg by mouth daily. (Patient not taking: Reported on 08/22/2022)   pantoprazole (PROTONIX) 40 MG tablet Take  1 tablet  Daily  to Prevent Heartburn &  Indigestion (Patient not taking: Reported on 04/22/2022)   Current Facility-Administered Medications on File Prior to Visit  Medication   omalizumab Geoffry Paradise) injection 150 mg      Allergies  Allergen Reactions   Paxlovid [Nirmatrelvir-Ritonavir] Nausea And Vomiting   Codeine Nausea Only   Propoxyphene N-Acetaminophen Nausea Only   Reglan [Metoclopramide]     EPS    Shellfish-Derived Products Rash      PMHx:   Past Medical History:  Diagnosis Date   Asthma    Cancer (HCC) 06/2021   left breast IDC   COPD (chronic obstructive pulmonary disease) (HCC)    Depression    Dizziness    Family history of thyroid cancer 06/29/2021   GERD (gastroesophageal reflux disease)    Hyperlipemia    Hypertension    Hypothyroidism    Prediabetes    SVD (spontaneous vaginal delivery)    x 2   Vitamin D deficiency       Immunization History  Administered Date(s) Administered   Influenza Whole 10/05/2009, 11/26/2010, 11/05/2011   Influenza, High Dose Seasonal PF 11/20/2012, 11/22/2013, 11/21/2014, 11/10/2015, 11/19/2016, 11/20/2017, 11/10/2018, 11/17/2019, 11/22/2020, 11/13/2021   PFIZER(Purple Top)SARS-COV-2 Vaccination 04/04/2019, 04/28/2019   Pneumococcal Conjugate-13 11/22/2013   Pneumococcal Polysaccharide-23 09/14/2008   Td 10/09/2004, 03/12/2017   Zoster, Live 02/15/2013      Past Surgical History:  Procedure Laterality Date   BREAST LUMPECTOMY     BREAST LUMPECTOMY WITH RADIOACTIVE SEED LOCALIZATION Left 07/25/2021   Procedure: LEFT BREAST LUMPECTOMY WITH RADIOACTIVE SEED LOCALIZATION;  Surgeon: Griselda Miner, MD;  Location: [facility];  Service: General;  Laterality: Left;   CATARACT EXTRACTION Right 09/03/2019   Dr. Dione Booze   DILATATION & CURRETTAGE/HYSTEROSCOPY WITH RESECTOCOPE N/A 09/10/2013   Procedure: DILATATION & CURETTAGE/HYSTEROSCOPY WITH RESECTOCOPE;  Surgeon: Juluis Mire, MD;  Location: [facility];  Service: Gynecology;  Laterality: N/A;   TONSILLECTOMY     TUBAL LIGATION Bilateral 1970   WISDOM TOOTH EXTRACTION       FHx:    Reviewed / unchanged   SHx:    Reviewed / unchanged    Systems Review:  Constitutional: Denies fever, chills, wt changes, headaches, insomnia, fatigue, night sweats, change in appetite. Eyes: Denies redness, blurred vision, diplopia, discharge, itchy, watery eyes.  ENT: Denies  discharge, congestion, post nasal drip,  epistaxis, sore throat, earache, hearing loss, dental pain, tinnitus, vertigo, sinus pain, snoring.  CV: Denies chest pain, palpitations, irregular heartbeat, syncope, dyspnea, diaphoresis, orthopnea, PND, claudication or edema. Respiratory: denies cough, dyspnea, DOE, pleurisy, hoarseness, laryngitis, wheezing.  Gastrointestinal: Denies dysphagia, odynophagia, heartburn, reflux, water brash, abdominal pain or cramps, nausea, vomiting, bloating, diarrhea, constipation, hematemesis, melena, hematochezia  or hemorrhoids. Genitourinary: Denies dysuria, frequency, urgency, nocturia, hesitancy, discharge, hematuria or flank pain. Musculoskeletal: Denies arthralgias, myalgias, stiffness, jt. swelling, pain, limping or strain/sprain.  Skin: Denies pruritus, rash, hives, warts, acne, eczema or change in skin lesion(s). Neuro: No weakness, tremor, incoordination, spasms, paresthesia or pain. Psychiatric: Denies confusion, memory loss or sensory loss. Endo: Denies change in weight, skin or hair change.  Heme/Lymph: No excessive bleeding, bruising or enlarged lymph nodes.   Physical Exam  BP (!) 142/70   Pulse (!) 45   Temp 97.6 F (36.4 C)   Ht 5' (1.524 m)   Wt 126 lb 9.6 oz (57.4 kg)   SpO2 98%   BMI 24.72 kg/m   Appears  well nourished, well groomed  and in no distress.  Eyes: PERRLA, EOMs, conjunctiva no swelling or erythema. Sinuses: No frontal/maxillary tenderness ENT/Mouth: EAC's clear, TM's nl w/o erythema, bulging. Nares clear w/o erythema, swelling, exudates. Oropharynx clear without erythema or exudates. Oral hygiene is good. Tongue normal, non obstructing. Hearing intact.  Neck: Supple. Thyroid not palpable. Car 2+/2+ without bruits, nodes or JVD. Chest: Respirations nl with BS clear & equal w/o rales, rhonchi, wheezing or stridor.  Cor: Heart sounds normal w/ regular rate and rhythm without sig. murmurs, gallops, clicks or rubs. Peripheral  pulses normal and equal  without edema.  Abdomen: Soft & bowel sounds normal. Non-tender w/o guarding, rebound, hernias, masses or organomegaly.  Lymphatics: Unremarkable.  Musculoskeletal: Full ROM all peripheral extremities, joint stability, 5/5 strength and normal gait.  Skin: Warm, dry without exposed rashes, lesions or ecchymosis apparent.  Neuro: Cranial nerves intact, reflexes equal bilaterally. Sensory-motor testing grossly intact. Tendon reflexes grossly intact.  Pysch: Alert & oriented x 3.  Insight and judgement nl & appropriate. No ideations.   Assessment and Plan:  - Continue medication, monitor blood pressure at home.  - Continue DASH diet.  Reminder to go to the ER if any CP,  SOB, nausea, dizziness, severe HA, changes vision/speech.  - Continue diet/meds, exercise,& lifestyle modifications.  - Continue monitor periodic cholesterol/liver & renal functions    - Continue diet, exercise  - Lifestyle modifications.  - Monitor appropriate labs. - Continue supplementation.        Discussed  regular exercise, BP monitoring, weight control to achieve/maintain BMI less than 25 and discussed med and SE's. Recommended labs to assess /monitor clinical status .  I discussed the assessment and treatment plan with the patient. The patient was provided an opportunity to ask questions and all were answered. The patient agreed with the plan and demonstrated an understanding of the instructions.  I provided over 30 minutes of exam, counseling, chart review and  complex critical decision making.        The patient was advised to call back or seek an in-person evaluation if the symptoms worsen or if the condition fails to improve as anticipated.   Marinus Maw, MD

## 2022-08-22 ENCOUNTER — Ambulatory Visit (INDEPENDENT_AMBULATORY_CARE_PROVIDER_SITE_OTHER): Payer: Medicare Other | Admitting: Internal Medicine

## 2022-08-22 ENCOUNTER — Encounter: Payer: Self-pay | Admitting: Internal Medicine

## 2022-08-22 VITALS — BP 142/70 | HR 45 | Temp 97.6°F | Ht 60.0 in | Wt 126.6 lb

## 2022-08-22 DIAGNOSIS — I10 Essential (primary) hypertension: Secondary | ICD-10-CM | POA: Diagnosis not present

## 2022-08-22 DIAGNOSIS — E559 Vitamin D deficiency, unspecified: Secondary | ICD-10-CM | POA: Diagnosis not present

## 2022-08-22 DIAGNOSIS — Z79899 Other long term (current) drug therapy: Secondary | ICD-10-CM

## 2022-08-22 DIAGNOSIS — R7309 Other abnormal glucose: Secondary | ICD-10-CM

## 2022-08-22 DIAGNOSIS — E039 Hypothyroidism, unspecified: Secondary | ICD-10-CM

## 2022-08-22 DIAGNOSIS — E782 Mixed hyperlipidemia: Secondary | ICD-10-CM | POA: Diagnosis not present

## 2022-08-22 NOTE — Patient Instructions (Signed)

## 2022-08-23 LAB — COMPLETE METABOLIC PANEL WITH GFR
AG Ratio: 1.5 (calc) (ref 1.0–2.5)
ALT: 8 U/L (ref 6–29)
AST: 15 U/L (ref 10–35)
Albumin: 4 g/dL (ref 3.6–5.1)
Alkaline phosphatase (APISO): 79 U/L (ref 37–153)
BUN: 15 mg/dL (ref 7–25)
CO2: 30 mmol/L (ref 20–32)
Calcium: 9.7 mg/dL (ref 8.6–10.4)
Chloride: 102 mmol/L (ref 98–110)
Creat: 0.85 mg/dL (ref 0.60–0.95)
Globulin: 2.6 g/dL (calc) (ref 1.9–3.7)
Glucose, Bld: 94 mg/dL (ref 65–99)
Potassium: 4.6 mmol/L (ref 3.5–5.3)
Sodium: 138 mmol/L (ref 135–146)
Total Bilirubin: 0.6 mg/dL (ref 0.2–1.2)
Total Protein: 6.6 g/dL (ref 6.1–8.1)
eGFR: 67 mL/min/{1.73_m2} (ref >=60)

## 2022-08-23 LAB — CBC WITH DIFFERENTIAL/PLATELET
Absolute Monocytes: 475 cells/uL (ref 200–950)
Basophils Absolute: 50 cells/uL (ref 0–200)
Basophils Relative: 1 %
Eosinophils Absolute: 140 cells/uL (ref 15–500)
Eosinophils Relative: 2.8 %
HCT: 37.9 % (ref 35.0–45.0)
Hemoglobin: 12.3 g/dL (ref 11.7–15.5)
Lymphs Abs: 1200 cells/uL (ref 850–3900)
MCH: 30 pg (ref 27.0–33.0)
MCHC: 32.5 g/dL (ref 32.0–36.0)
MCV: 92.4 fL (ref 80.0–100.0)
MPV: 11.7 fL (ref 7.5–12.5)
Monocytes Relative: 9.5 %
Neutro Abs: 3135 cells/uL (ref 1500–7800)
Neutrophils Relative %: 62.7 %
Platelets: 255 10*3/uL (ref 140–400)
RBC: 4.1 10*6/uL (ref 3.80–5.10)
RDW: 13.1 % (ref 11.0–15.0)
Total Lymphocyte: 24 %
WBC: 5 10*3/uL (ref 3.8–10.8)

## 2022-08-23 LAB — LIPID PANEL
Cholesterol: 230 mg/dL — ABNORMAL HIGH (ref <200)
HDL: 63 mg/dL (ref >=50)
LDL Cholesterol (Calc): 143 mg/dL (calc) — ABNORMAL HIGH
Non-HDL Cholesterol (Calc): 167 mg/dL (calc) — ABNORMAL HIGH (ref <130)
Total CHOL/HDL Ratio: 3.7 (calc) (ref <5.0)
Triglycerides: 122 mg/dL (ref <150)

## 2022-08-23 LAB — HEMOGLOBIN A1C W/OUT EAG: Hgb A1c MFr Bld: 5.6 % of total Hgb (ref <5.7)

## 2022-08-23 LAB — INSULIN, RANDOM: Insulin: 14.1 u[IU]/mL

## 2022-08-23 LAB — TSH: TSH: 3.22 mIU/L (ref 0.40–4.50)

## 2022-08-23 LAB — MAGNESIUM: Magnesium: 1.9 mg/dL (ref 1.5–2.5)

## 2022-08-23 LAB — VITAMIN D 25 HYDROXY (VIT D DEFICIENCY, FRACTURES): Vit D, 25-Hydroxy: 70 ng/mL (ref 30–100)

## 2022-08-24 ENCOUNTER — Encounter: Payer: Self-pay | Admitting: Internal Medicine

## 2022-08-24 ENCOUNTER — Other Ambulatory Visit: Payer: Self-pay | Admitting: Internal Medicine

## 2022-08-24 DIAGNOSIS — E782 Mixed hyperlipidemia: Secondary | ICD-10-CM

## 2022-08-24 MED ORDER — ROSUVASTATIN CALCIUM 20 MG PO TABS: ORAL_TABLET | ORAL | 3 refills | Status: DC

## 2022-08-24 NOTE — Progress Notes (Signed)
^<^<^<^<^<^<^<^<^<^<^<^<^<^<^<^<^<^<^<^<^<^<^<^<^<^<^<^<^<^<^<^<^<^<^<^<^ ^>^>^>^>^>^>^>^>^>^>^>>^>^>^>^>^>^>^>^>^>^>^>^>^>^>^>^>^>^>^>^>^>^>^>^>^>  -Test results slightly outside the reference range are not unusual. If there is anything important, I will review this with you,  otherwise it is considered normal test values.  If you have further questions,  please do not hesitate to contact me at the office or via My Chart.   ^<^<^<^<^<^<^<^<^<^<^<^<^<^<^<^<^<^<^<^<^<^<^<^<^<^<^<^<^<^<^<^<^<^<^<^<^ ^>^>^>^>^>^>^>^>^>^>^>^>^>^>^>^>^>^>^>^>^>^>^>^>^>^>^>^>^>^>^>^>^>^>^>^>^  - Total Chol =  230   is very high risk for Heart Attack  / Stroke / Vascular Dementia     ( Ideal or Goal is less than 180 ! )   &  - Bad / Dangerous LDL Chol =  143    - - >> Sitting on a time Bomb !     ( Ideal or Goal is less than 70 ! )    - Treating with meds to lower Cholesterol is treating the result                                          & NOT treating the cause - The cause is Bad Diet !   - But need to go ahead & start meds until get on a better diet to try &                                                        reverse some of the Damage already done   - Sent a new Rx for Rosuvastatin (Crestor ) [facility]  to                                                               Prevent Heart Attack, Strokes & Dementia    - Read or listen to   Dr Gerri Spore 's book  " How Not to Die ! "    - Recommend a stricter plant based low cholesterol diet    - Cholesterol only comes from animal sources                                                                 - ie. meat, dairy, egg yolks  - Eat all the vegetables you want.  - Avoid Meat, Avoid Meat , Avoid Meat  ! ! !                                                  -especially red meat - Beef AND Pork  - Avoid cheese & dairy - milk & ice cream.   - Cheese is the most  concentrated form of trans-fats which  is the worst thing to clog up our arteries.   - Veggie cheese is OK which can be found in                                              the fresh produce section at                                                          [employer] or [employer] or [employer] ^>^>^>^>^>^>^>^>^>^>^>^>^>^>^>^>^>^>^>^>^>^>^>^>^>^>^>^>^>^>^>^>^>^>^>^>^ ^>^>^>^>^>^>^>^>^>^>^>^>^>^>^>^>^>^>^>^>^>^>^>^>^>^>^>^>^>^>^>^>^>^>^>^>^  - A1c - Normal - Great  - No Diabetes  !  ^>^>^>^>^>^>^>^>^>^>^>^>^>^>^>^>^>^>^>^>^>^>^>^>^>^>^>^>^>^>^>^>^>^>^>^>^  -  Vitamin D is 70 - - Excellent - Please keep dose same   ^>^>^>^>^>^>^>^>^>^>^>^>^>^>^>^>^>^>^>^>^>^>^>^>^>^>^>^>^>^>^>^>^>^>^>^>^ ^>^>^>^>^>^>^>^>^>^>^>^>^>^>^>^>^>^>^>^>^>^>^>^>^>^>^>^>^>^>^>^>^>^>^>^>^  -  All Else - CBC - Kidneys - Electrolytes - Liver - Magnesium & Thyroid    - all  Normal / OK ^>^>^>^>^>^>^>^>^>^>^>^>^>^>^>^>^>^>^>^>^>^>^>^>^>^>^>^>^>^>^>^>^>^>^>^>^ ^>^>^>^>^>^>^>^>^>^>^>^>^>^>^>^>^>^>^>^>^>^>^>^>^>^>^>^>^>^>^>^>^>^>^>^>^

## 2022-09-19 ENCOUNTER — Ambulatory Visit (INDEPENDENT_AMBULATORY_CARE_PROVIDER_SITE_OTHER): Payer: Medicare Other

## 2022-09-19 VITALS — BP 134/70 | HR 49 | Temp 97.7°F | Resp 20 | Ht 61.0 in | Wt 124.8 lb

## 2022-09-19 DIAGNOSIS — J4540 Moderate persistent asthma, uncomplicated: Secondary | ICD-10-CM | POA: Diagnosis not present

## 2022-09-19 MED ORDER — OMALIZUMAB 150 MG/ML ~~LOC~~ SOSY
150.0000 mg | PREFILLED_SYRINGE | Freq: Once | SUBCUTANEOUS | Status: AC
  Administered 2022-09-19: 150 mg via SUBCUTANEOUS
  Filled 2022-09-19: qty 1

## 2022-09-19 NOTE — Progress Notes (Signed)
Diagnosis: Asthma  Provider:  Chilton Greathouse MD  Procedure: Injection  Xolair (Omalizumab), Dose: 150 mg, Site: subcutaneous, Number of injections: 1  Post Care: Patient declined observation  Discharge: Condition: Good, Destination: Home . AVS Declined  Performed by:  Adriana Mccallum, RN

## 2022-10-07 NOTE — Progress Notes (Unsigned)
Subjective:     Patient ID: Kathy Whitaker, female   DOB: 1933/09/13   MRN: [id number]    Brief patient profile:  74  yowf never smoker with h/o allergic rhinitis/ moderate chronic asthma dating back to  Her late 70s and much better on xolair/performist/budesonide previously under Dr Lynelle Doctor care  - on Xolair since around 2009 / reduced to monthly rx 04/2015 by Dr Maple Hudson      History of Present Illness 05/02/2015 1st   office visit/ Kathy Whitaker / transition of care from Dr Delford Field  Chief Complaint  Patient presents with   Follow-up    Former PW pt. Pt c/o occasional SOB with exertion. Pt states that her breathing is doing well. She recently had a cold but is improving. Pt denies cough/wheeze/CP/tightness. Pt is on Xolair and reports no complications/reactions.   maint rx with performist/budesonide rarely need combivent (very poor hfa noted - see a/p) Says other forms of ICS don't work. Concerned about longterm effects of meds  rec Combine your nebulizer solutions and take each am plus second treatment in pm if needed. Work on [employer].   08/10/2020  f/u ov/Kathy Whitaker re: asthma/ rhinitis on brovana / bud maint one daily  rare saba  Chief Complaint  Patient presents with   Follow-up    Breathing is overall doing well. She has occ cough and wheeze but has not bothered her in about a month. She rarely uses her combivent inhaler.    Dyspnea:  MMRC1 = can walk nl pace, flat grade, can't hurry or go uphills or steps s sob   Cough: resolved one month prior to OV  with neg covid testing Sleeping: flat bed / one pillow SABA use: rarely  02: none Covid status:  Vax x 3  Rec Work on inhaler technique:    No change medications If you want the MDI (old fashioned) I'll write you a new prescription and ask you to bring it in    08/15/2021  f/u ov/Kathy Whitaker re: asthma/ rhinitis maint on xolair/ performist/bud    Chief Complaint  Patient presents with   Follow-up    Sob- better, cough dry occass.  Dyspnea:   MMRC1 = can walk nl pace, flat grade, can't hurry or go uphills or steps s sob   Cough: minimal dry  Sleeping: flat bed / one pillow  SABA use: rarely 02: none  Rec No change in medications      10/08/2022  Yearly  f/u ov/Kathy Whitaker re: ***   maint on ***  No chief complaint on file.   Dyspnea:  *** Cough: *** Sleeping: *** resp cc  SABA use: *** 02: ***  Lung cancer screening :  ***    No obvious day to day or daytime variability or assoc excess/ purulent sputum or mucus plugs or hemoptysis or cp or chest tightness, subjective wheeze or overt sinus or hb symptoms.    Also denies any obvious fluctuation of symptoms with weather or environmental changes or other aggravating or alleviating factors except as outlined above   No unusual exposure hx or h/o childhood pna/ asthma or knowledge of premature birth.  Current Allergies, Complete Past Medical History, Past Surgical History, Family History, and Social History were reviewed in [employer] record.  ROS  The following are not active complaints unless bolded Hoarseness, sore throat, dysphagia, dental problems, itching, sneezing,  nasal congestion or discharge of excess mucus or purulent secretions, ear ache,   fever, chills, sweats, unintended wt  loss or wt gain, classically pleuritic or exertional cp,  orthopnea pnd or arm/hand swelling  or leg swelling, presyncope, palpitations, abdominal pain, anorexia, nausea, vomiting, diarrhea  or change in bowel habits or change in bladder habits, change in stools or change in urine, dysuria, hematuria,  rash, arthralgias, visual complaints, headache, numbness, weakness or ataxia or problems with walking or coordination,  change in mood or  memory.        No outpatient medications have been marked as taking for the 10/08/22 encounter (Appointment) with Nyoka Cowden, MD.   Current Facility-Administered Medications for the 10/08/22 encounter (Appointment) with Nyoka Cowden, MD   Medication   omalizumab Geoffry Paradise) injection 150 mg                        Objective:   Physical Exam  Wts  10/08/2022           ***  08/15/2021        122  08/10/2020          123 06/04/2019        125   11/18/2017     127   05/02/15 136 lb (61.689 kg)  11/29/14 136 lb (61.689 kg)  03/23/14 138 lb (62.596 kg)    Vital signs reviewed  10/08/2022  - Note at rest 02 sats  ***% on ***   General appearance:    ***  Min barr            Assessment:

## 2022-10-08 ENCOUNTER — Encounter: Payer: Self-pay | Admitting: Internal Medicine

## 2022-10-08 ENCOUNTER — Ambulatory Visit: Payer: Medicare Other | Admitting: Internal Medicine

## 2022-10-08 VITALS — BP 120/66 | HR 52 | Temp 98.1°F | Ht 60.0 in | Wt 126.0 lb

## 2022-10-08 DIAGNOSIS — J4540 Moderate persistent asthma, uncomplicated: Secondary | ICD-10-CM | POA: Diagnosis not present

## 2022-10-08 MED ORDER — OMALIZUMAB 150 MG ~~LOC~~ SOLR: 150.0000 mg | SUBCUTANEOUS | 11 refills | Status: AC

## 2022-10-08 NOTE — Patient Instructions (Signed)
No change in medications   Follow up is as needed

## 2022-10-09 NOTE — Assessment & Plan Note (Addendum)
Onset her late 20's Xolair rx since at least 2009 per EMR  - Spirometry 07/05/08  FEV1 1.60 (100%)  Ratio 62 p 15% resp to saba   - changed to monthly  interval per Dr Maple Hudson rec as of 05/02/2015    - Spirometry 11/18/2017  FEV1 1,0  (68%)  Ratio 61  p am formoterol/bud - FENO 11/18/2017  =   27 on bud neb  - 08/10/2020  After extensive coaching inhaler device,  effectiveness =   Struggles with [facility]   All goals of chronic asthma control met including optimal function and elimination of symptoms with minimal need for rescue therapy.  Contingencies discussed in full including contacting this office immediately if not controlling the symptoms using the rule of two's.     No change rx needed  Rx for xolair to take with her to [facility] and referred to pulmonary there -  f/u here prn          Each maintenance medication was reviewed in detail including emphasizing most importantly the difference between maintenance and prns and under what circumstances the prns are to be triggered using an action plan format where appropriate.  Total time for H and P, chart review, counseling, reviewing neb device(s) and generating customized AVS unique to this office visit / same day charting = 23 min final summary f/u

## 2022-10-15 ENCOUNTER — Ambulatory Visit (INDEPENDENT_AMBULATORY_CARE_PROVIDER_SITE_OTHER): Payer: Medicare Other

## 2022-10-15 VITALS — BP 135/66 | HR 54 | Temp 97.5°F | Resp 16 | Ht 60.0 in | Wt 125.4 lb

## 2022-10-15 DIAGNOSIS — J4540 Moderate persistent asthma, uncomplicated: Secondary | ICD-10-CM | POA: Diagnosis not present

## 2022-10-15 MED ORDER — OMALIZUMAB 150 MG/ML ~~LOC~~ SOSY
150.0000 mg | PREFILLED_SYRINGE | Freq: Once | SUBCUTANEOUS | Status: AC
  Administered 2022-10-15: 150 mg via SUBCUTANEOUS
  Filled 2022-10-15: qty 1

## 2022-10-15 NOTE — Progress Notes (Signed)
Diagnosis: Asthma  Provider:  Chilton Greathouse MD  Procedure: Injection  Xolair (Omalizumab), Dose: 150 mg, Site: subcutaneous, Number of injections: 1  Post Care: Patient declined observation  Discharge: Condition: Good, Destination: Home . AVS Declined  Performed by:  Rico Ala, LPN

## 2022-10-17 ENCOUNTER — Ambulatory Visit: Payer: Medicare Other

## 2022-11-18 ENCOUNTER — Telehealth: Payer: Self-pay | Admitting: Internal Medicine

## 2022-11-18 NOTE — Telephone Encounter (Signed)
[place] and now needs her prescription sent in to a [facility] [address] [place], [place] [address]

## 2022-11-19 NOTE — Telephone Encounter (Signed)
[place] and now needs her prescription sent in to a [facility] [address] [place], [place] [address]

## 2022-12-02 ENCOUNTER — Telehealth: Payer: Self-pay | Admitting: Internal Medicine

## 2022-12-02 NOTE — Telephone Encounter (Signed)
Rx for Xolair will not be sent as patient receives through buy/bill. Called [facility] to discontinue rx request. Per pharmacy rep, they were needing prior authorization not refill.  Chesley Mires, PharmD, MPH, BCPS, CPP Clinical Pharmacist (Rheumatology and Pulmonology)

## 2022-12-02 NOTE — Telephone Encounter (Signed)
Alan Ripper  states patient needs refill for Xolair. Pharmacy is [facility], [place] as a foreign language. Alan Ripper phone number is [phone]/

## 2022-12-02 NOTE — Telephone Encounter (Signed)
Xolair is being completed at [facility] via buy and bill it looks like since no prescriptions have ever been sent for Xolair  Dose: 150mg  SQ every 4 weeks  Last OV: 10/08/2022 Provider: Dr. Dayna Ramus, PharmD, MPH, BCPS Clinical Pharmacist (Rheumatology and Pulmonology)

## 2022-12-19 ENCOUNTER — Encounter: Payer: Medicare Other | Admitting: Internal Medicine

## 2023-04-18 ENCOUNTER — Ambulatory Visit: Payer: Medicare Other | Admitting: Nurse Practitioner

## 2023-07-17 ENCOUNTER — Telehealth: Payer: Self-pay | Admitting: Internal Medicine

## 2023-07-17 NOTE — Telephone Encounter (Signed)
 CMN for nebulizer set [facility]

## 2023-08-29 ENCOUNTER — Other Ambulatory Visit: Payer: Self-pay

## 2023-08-29 DIAGNOSIS — J4530 Mild persistent asthma, uncomplicated: Secondary | ICD-10-CM

## 2023-08-29 MED ORDER — FORMOTEROL FUMARATE 20 MCG/2ML IN NEBU
INHALATION_SOLUTION | RESPIRATORY_TRACT | 11 refills | Status: AC
Start: 2023-08-29 — End: ?

## 2023-08-29 MED ORDER — BUDESONIDE 0.25 MG/2ML IN SUSP
RESPIRATORY_TRACT | 11 refills | Status: AC
Start: 2023-08-29 — End: ?

## 2023-11-10 ENCOUNTER — Other Ambulatory Visit: Payer: Self-pay | Admitting: Nurse Practitioner

## 2023-11-10 DIAGNOSIS — F3341 Major depressive disorder, recurrent, in partial remission: Secondary | ICD-10-CM

## 2024-01-20 IMAGING — MG MM DIGITAL DIAGNOSTIC UNILAT*L* W/ TOMO W/ CAD
6 series · 6 of 18 positions shown · non-contrast
Comparison: Previous exam(s).
COMPARISON: Previous exam(s).

Addendum:
CLINICAL DATA: The patient was called back for a left breast mass.

EXAM:
DIGITAL DIAGNOSTIC UNILATERAL LEFT MAMMOGRAM WITH TOMOSYNTHESIS AND
CAD; ULTRASOUND LEFT BREAST LIMITED
TECHNIQUE: Left digital diagnostic mammography and breast tomosynthesis was
performed. The images were evaluated with computer-aided detection.;
Targeted ultrasound examination of the left breast was performed.

[L CC synth-2D (1 of 2)]
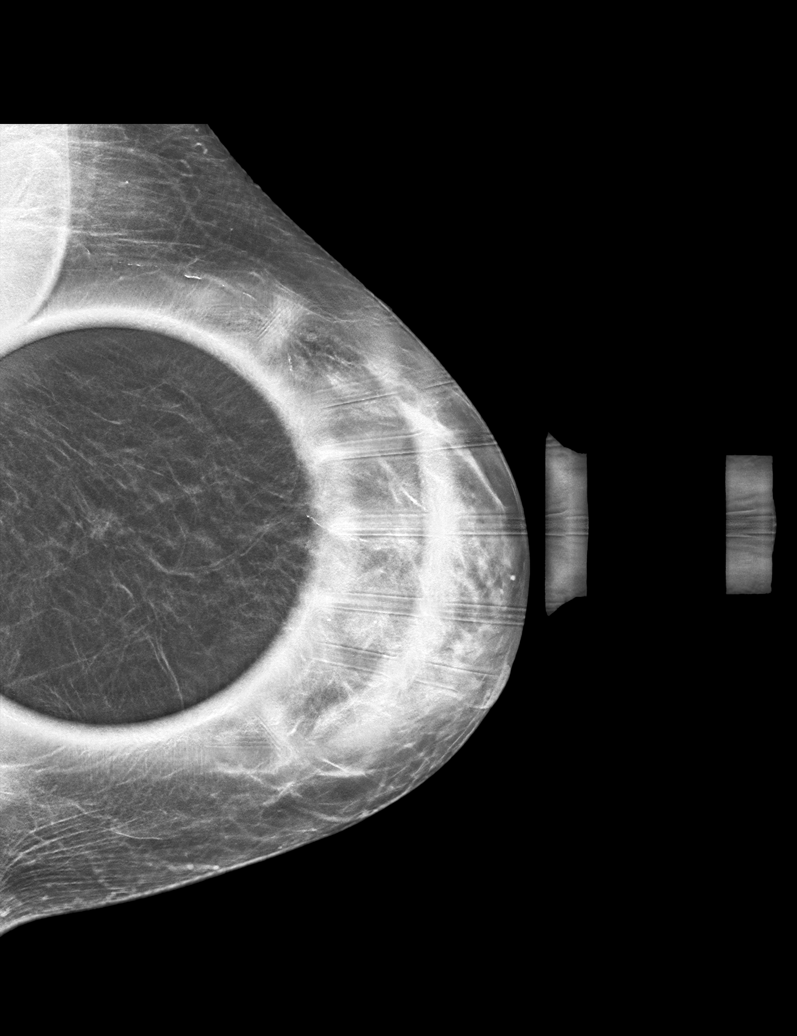

[L CC synth-2D (2 of 2)]
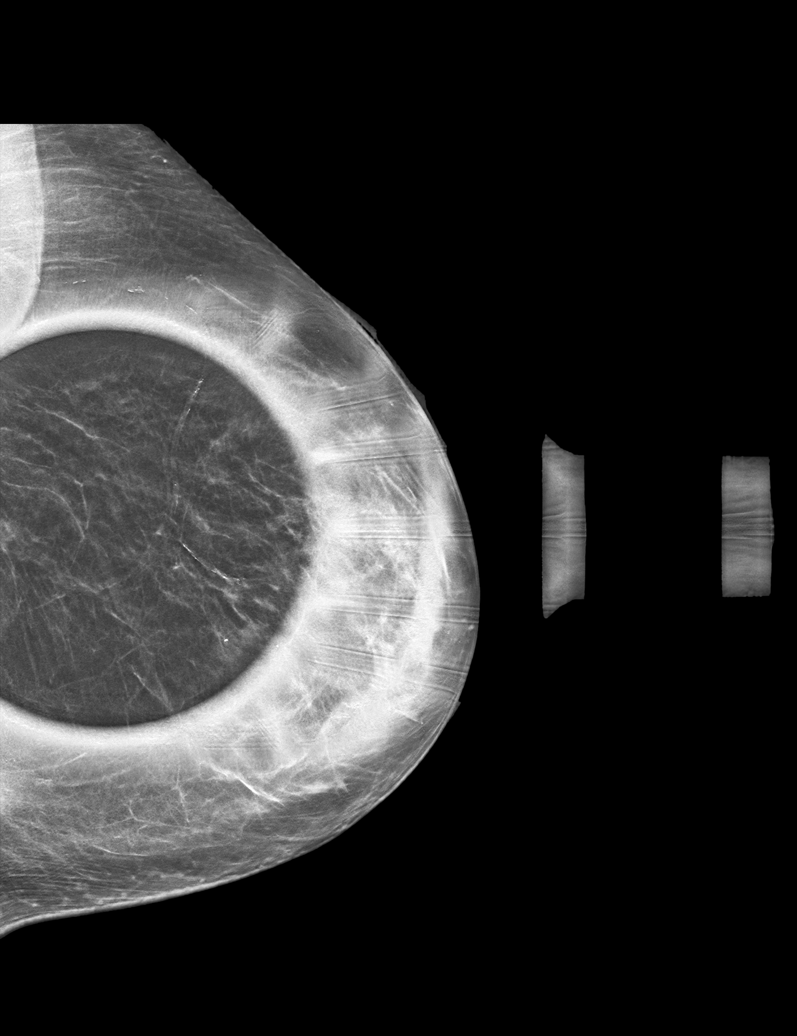

[L ML synth-2D]
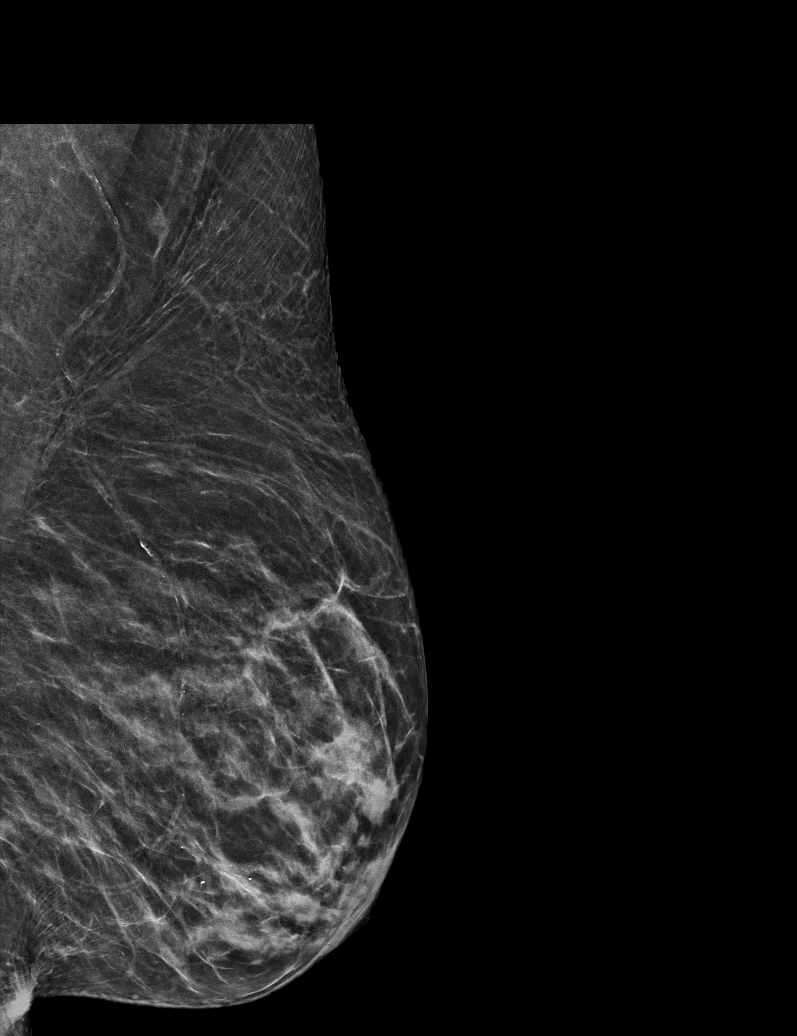

[L ML tomo · tomo slice 25/48.0]
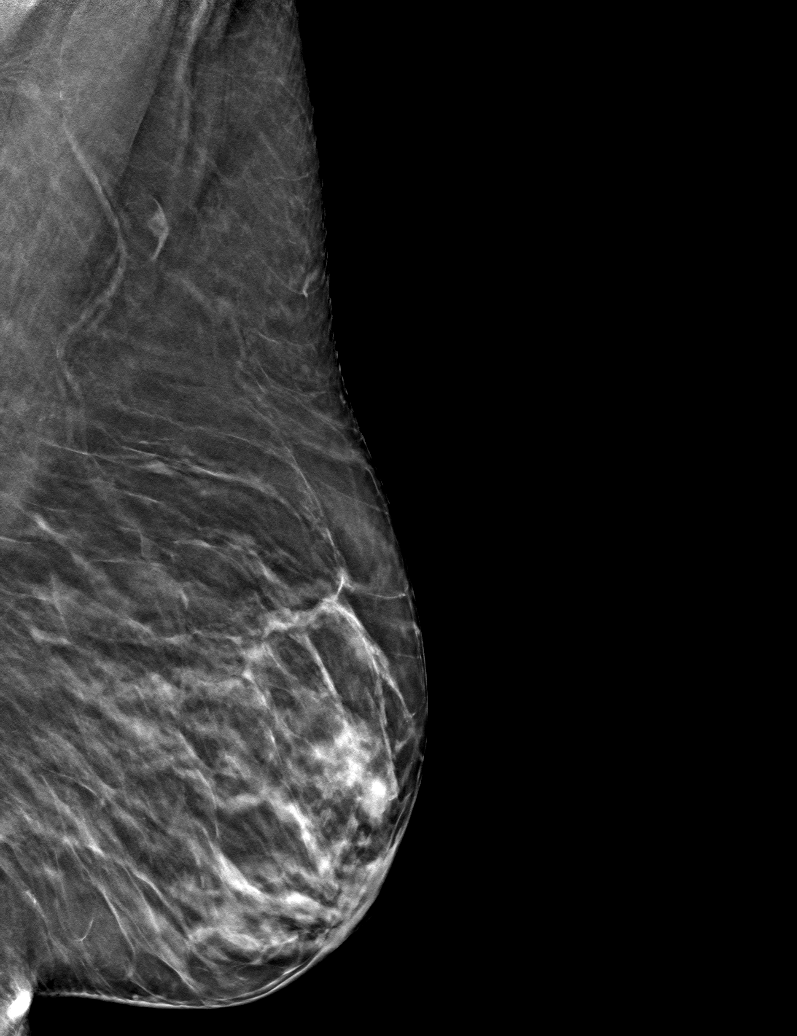

[L CC tomo (1 of 2) · tomo slice 23/45.0]
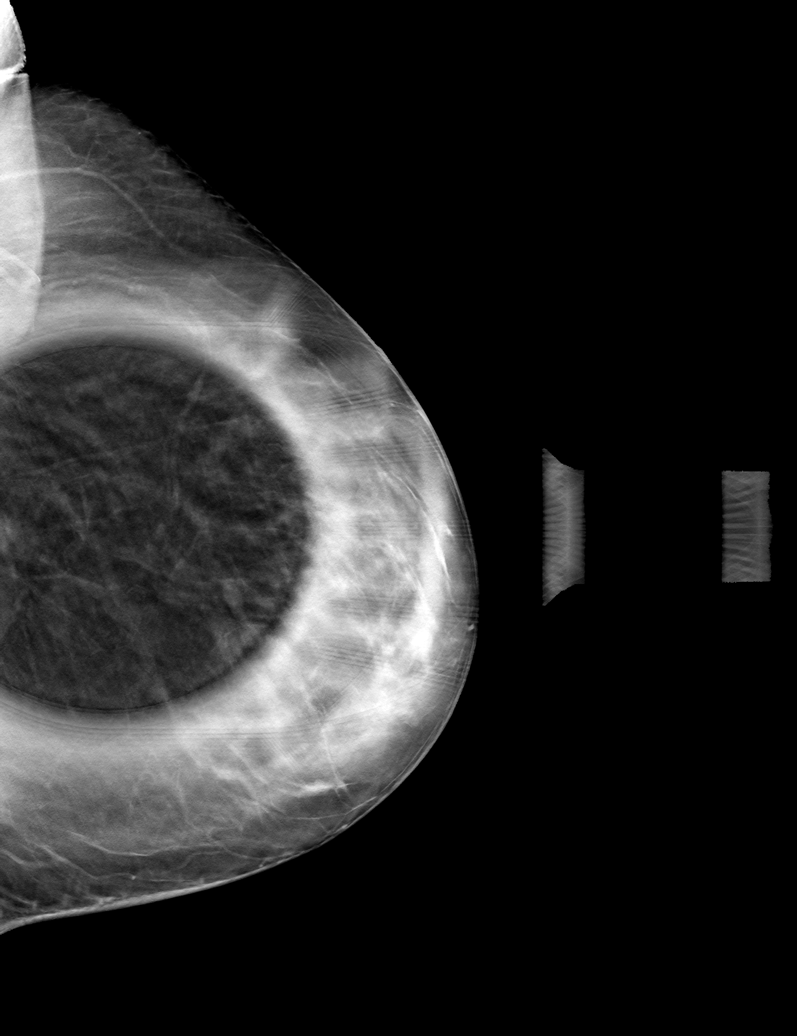

[L CC tomo (2 of 2) · tomo slice 23/46.0]
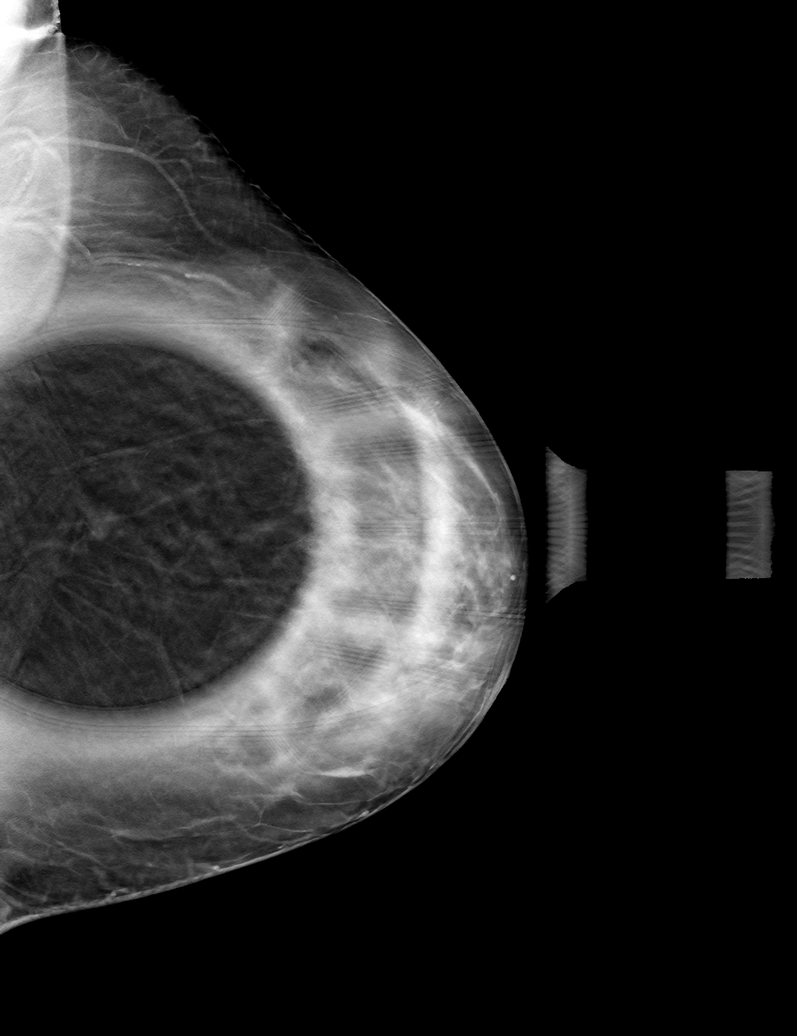

[6 of 18 positions shown; findings below may reference images not displayed]

ACR Breast Density Category b: There are scattered areas of
fibroglandular density.
FINDINGS: The mass in the left breast at a posterior depth persists on
additional imaging.

On physical exam, no suspicious lumps are identified.

Targeted ultrasound is performed, showing no sonographic correlate
for the left breast mass.
IMPRESSION: Indeterminate left breast mass only identified on the cc view.

RECOMMENDATION:
Recommend stereotactic biopsy of the left breast mass.

I have discussed the findings and recommendations with the patient.
If applicable, a reminder letter will be sent to the patient
regarding the next appointment.

BI-RADS CATEGORY  4: Suspicious.

ADDENDUM:
Mammographically, the mass in the posterior aspect of the LEFT
breast measures 0.7 centimeters.

Evaluation of the LEFT axilla is negative for adenopathy.

*** End of Addendum ***
ACR Breast Density Category b: There are scattered areas of
fibroglandular density.
FINDINGS: The mass in the left breast at a posterior depth persists on
additional imaging.

On physical exam, no suspicious lumps are identified.

Targeted ultrasound is performed, showing no sonographic correlate
for the left breast mass.
IMPRESSION: Indeterminate left breast mass only identified on the cc view.

RECOMMENDATION:
Recommend stereotactic biopsy of the left breast mass.

I have discussed the findings and recommendations with the patient.
If applicable, a reminder letter will be sent to the patient
regarding the next appointment.

BI-RADS CATEGORY  4: Suspicious.
